# Patient Record
Sex: Male | Born: 1955
Health system: Southern US, Community
[De-identification: ages and names within clinical notes are randomized; demographics above are authoritative.]

## PROBLEM LIST (undated history)

## (undated) DIAGNOSIS — L0591 Pilonidal cyst without abscess: Secondary | ICD-10-CM

## (undated) DIAGNOSIS — Z954 Presence of other heart-valve replacement: Secondary | ICD-10-CM

## (undated) DIAGNOSIS — Q2381 Bicuspid aortic valve: Secondary | ICD-10-CM

## (undated) DIAGNOSIS — R011 Cardiac murmur, unspecified: Secondary | ICD-10-CM

## (undated) DIAGNOSIS — Z87442 Personal history of urinary calculi: Secondary | ICD-10-CM

## (undated) DIAGNOSIS — I76 Septic arterial embolism: Secondary | ICD-10-CM

## (undated) DIAGNOSIS — Z953 Presence of xenogenic heart valve: Secondary | ICD-10-CM

## (undated) DIAGNOSIS — Q231 Congenital insufficiency of aortic valve: Secondary | ICD-10-CM

## (undated) DIAGNOSIS — Z5189 Encounter for other specified aftercare: Secondary | ICD-10-CM

## (undated) DIAGNOSIS — I7789 Other specified disorders of arteries and arterioles: Secondary | ICD-10-CM

## (undated) DIAGNOSIS — M1990 Unspecified osteoarthritis, unspecified site: Secondary | ICD-10-CM

## (undated) DIAGNOSIS — I10 Essential (primary) hypertension: Secondary | ICD-10-CM

## (undated) DIAGNOSIS — Z950 Presence of cardiac pacemaker: Secondary | ICD-10-CM

## (undated) DIAGNOSIS — Q211 Atrial septal defect: Secondary | ICD-10-CM

## (undated) DIAGNOSIS — I350 Nonrheumatic aortic (valve) stenosis: Principal | ICD-10-CM

## (undated) HISTORY — DX: Other specified disorders of arteries and arterioles: I77.89

## (undated) HISTORY — DX: Congenital insufficiency of aortic valve: Q23.1

## (undated) HISTORY — DX: Encounter for other specified aftercare: Z51.89

## (undated) HISTORY — DX: Bicuspid aortic valve: Q23.81

## (undated) HISTORY — DX: Nonrheumatic aortic (valve) stenosis: I35.0

## (undated) HISTORY — DX: Essential (primary) hypertension: I10

---

## 1977-07-20 HISTORY — PX: HAND SURGERY: SHX662

## 1999-07-28 ENCOUNTER — Emergency Department (HOSPITAL_COMMUNITY): Admission: EM | Admit: 1999-07-28 | Discharge: 1999-07-28 | Payer: Self-pay | Admitting: Emergency Medicine

## 1999-10-30 ENCOUNTER — Ambulatory Visit (HOSPITAL_BASED_OUTPATIENT_CLINIC_OR_DEPARTMENT_OTHER): Admission: RE | Admit: 1999-10-30 | Discharge: 1999-10-30 | Payer: Self-pay | Admitting: Urology

## 2002-06-21 HISTORY — PX: VASECTOMY: SHX75

## 2013-07-16 ENCOUNTER — Ambulatory Visit: Payer: Self-pay | Admitting: Sports Medicine

## 2013-07-17 ENCOUNTER — Ambulatory Visit: Payer: Self-pay | Admitting: Sports Medicine

## 2013-07-24 ENCOUNTER — Ambulatory Visit (INDEPENDENT_AMBULATORY_CARE_PROVIDER_SITE_OTHER): Payer: 59 | Admitting: Sports Medicine

## 2013-07-24 ENCOUNTER — Encounter: Payer: Self-pay | Admitting: Sports Medicine

## 2013-07-24 VITALS — BP 172/99 | HR 70 | Ht 71.0 in | Wt 261.0 lb

## 2013-07-24 DIAGNOSIS — R011 Cardiac murmur, unspecified: Secondary | ICD-10-CM

## 2013-07-24 DIAGNOSIS — M545 Low back pain, unspecified: Secondary | ICD-10-CM

## 2013-07-24 DIAGNOSIS — I350 Nonrheumatic aortic (valve) stenosis: Secondary | ICD-10-CM

## 2013-07-24 DIAGNOSIS — I10 Essential (primary) hypertension: Secondary | ICD-10-CM | POA: Insufficient documentation

## 2013-07-24 DIAGNOSIS — Z299 Encounter for prophylactic measures, unspecified: Secondary | ICD-10-CM

## 2013-07-24 DIAGNOSIS — Z0000 Encounter for general adult medical examination without abnormal findings: Secondary | ICD-10-CM | POA: Insufficient documentation

## 2013-07-24 HISTORY — DX: Nonrheumatic aortic (valve) stenosis: I35.0

## 2013-07-24 MED ORDER — PREDNISONE 50 MG PO TABS
ORAL_TABLET | ORAL | Status: DC
Start: 2013-07-24 — End: 2013-09-04

## 2013-07-24 MED ORDER — MELOXICAM 15 MG PO TABS: ORAL_TABLET | ORAL | Status: DC

## 2013-07-24 MED ORDER — KETOROLAC TROMETHAMINE 30 MG/ML IJ SOLN
30.0000 mg | Freq: Once | INTRAMUSCULAR | Status: AC
  Administered 2013-07-24: 30 mg via INTRAMUSCULAR

## 2013-07-24 NOTE — Assessment & Plan Note (Addendum)
Patient is currently in significant pain. We can reevaluate this at future visits.

## 2013-07-24 NOTE — Progress Notes (Signed)
  Subjective:    CC: Establish care.   HPI:  This is a pleasant 58 year old male, he comes in with a several-day history of pain he localizes in the low back, worse after bending forward to pick something up. Pain is moderate, persistent, improving slightly. No radicular symptoms, no constitutional symptoms, no bowel or bladder dysfunction. It's worse with flexion and Valsalva.  Hypertension: Blood pressure has been elevated on multiple occasions in the distant past but he has not been with physician in about 30 years. He is in significant pain right now.  Preventive measures: Understands that he is due for a colonoscopy he would like me to set this up, also like some blood work.  Past medical history, Surgical history, Family history not pertinant except as noted below, Social history, Allergies, and medications have been entered into the medical record, reviewed, and no changes needed.   Review of Systems: No headache, visual changes, nausea, vomiting, diarrhea, constipation, dizziness, abdominal pain, skin rash, fevers, chills, night sweats, swollen lymph nodes, weight loss, chest pain, body aches, joint swelling, muscle aches, shortness of breath, mood changes, visual or auditory hallucinations.  Objective:    General: Well Developed, well nourished, and in no acute distress.  Neuro: Alert and oriented x3, extra-ocular muscles intact, sensation grossly intact.  HEENT: Normocephalic, atraumatic, pupils equal round reactive to light, neck supple, no masses, no lymphadenopathy, thyroid nonpalpable.  Skin: Warm and dry, no rashes noted.  Cardiac: Regular rate and rhythm, no rubs or gallops. He does have a 3/6 systolic ejection murmur heard best at the left and right second intercostal spaces suggestive of an aortic stenosis murmur. Respiratory: Clear to auscultation bilaterally. Not using accessory muscles, speaking in full sentences.  Abdominal: Soft, nontender, nondistended, positive bowel  sounds, no masses, no organomegaly.  Back Exam:  Inspection: Unremarkable  Motion: Flexion 45 deg, Extension 45 deg, Side Bending to 45 deg bilaterally,  Rotation to 45 deg bilaterally  SLR laying: Negative  XSLR laying: Negative  Palpable tenderness: None. FABER: negative. Sensory change: Gross sensation intact to all lumbar and sacral dermatomes.  Reflexes: 2+ at both patellar tendons, 2+ at achilles tendons, Babinski's downgoing.  Strength at foot  Plantar-flexion: 5/5 Dorsi-flexion: 5/5 Eversion: 5/5 Inversion: 5/5  Leg strength  Quad: 5/5 Hamstring: 5/5 Hip flexor: 5/5 Hip abductors: 5/5  Gait unremarkable.  Impression and Recommendations:    The patient was counselled, risk factors were discussed, anticipatory guidance given.

## 2013-07-24 NOTE — Assessment & Plan Note (Signed)
Checking routine blood work, referral for colonoscopy.

## 2013-07-24 NOTE — Assessment & Plan Note (Addendum)
Asymptomatic. This does sound like aortic stenosis. I'm going to obtain an echocardiogram.

## 2013-07-24 NOTE — Assessment & Plan Note (Signed)
Prednisone, Toradol intramuscular, home rehabilitation, Mobic.

## 2013-08-21 ENCOUNTER — Ambulatory Visit: Payer: 59 | Admitting: Sports Medicine

## 2013-08-24 ENCOUNTER — Encounter: Payer: Self-pay | Admitting: Sports Medicine

## 2013-08-29 ENCOUNTER — Telehealth: Payer: Self-pay | Admitting: *Deleted

## 2013-08-29 NOTE — Telephone Encounter (Signed)
No PA required for 2D echo.  Oscar La, LPN

## 2013-08-31 ENCOUNTER — Ambulatory Visit (HOSPITAL_COMMUNITY): Payer: 59 | Attending: Cardiovascular Disease | Admitting: Radiology

## 2013-08-31 ENCOUNTER — Other Ambulatory Visit (HOSPITAL_COMMUNITY): Payer: Self-pay | Admitting: Radiology

## 2013-08-31 DIAGNOSIS — R011 Cardiac murmur, unspecified: Secondary | ICD-10-CM

## 2013-08-31 LAB — COMPREHENSIVE METABOLIC PANEL
ALT: 18 U/L (ref 0–53)
AST: 17 U/L (ref 0–37)
Albumin: 4.3 g/dL (ref 3.5–5.2)
Alkaline Phosphatase: 58 U/L (ref 39–117)
CO2: 28 mEq/L (ref 19–32)
Calcium: 10 mg/dL (ref 8.4–10.5)
Chloride: 104 mEq/L (ref 96–112)
Creat: 1.07 mg/dL (ref 0.50–1.35)
Potassium: 4.8 mEq/L (ref 3.5–5.3)
Sodium: 141 mEq/L (ref 135–145)
Total Bilirubin: 0.7 mg/dL (ref 0.2–1.2)
Total Protein: 7 g/dL (ref 6.0–8.3)

## 2013-08-31 LAB — LIPID PANEL
Cholesterol: 185 mg/dL (ref 0–200)
HDL: 45 mg/dL (ref >39)
LDL Cholesterol: 123 mg/dL — ABNORMAL HIGH (ref 0–99)
Total CHOL/HDL Ratio: 4.1 ratio
Triglycerides: 86 mg/dL (ref <150)
VLDL: 17 mg/dL (ref 0–40)

## 2013-08-31 LAB — CBC
HCT: 41.3 % (ref 39.0–52.0)
Hemoglobin: 14.1 g/dL (ref 13.0–17.0)
MCH: 27.9 pg (ref 26.0–34.0)
MCHC: 34.1 g/dL (ref 30.0–36.0)
MCV: 81.8 fL (ref 78.0–100.0)
Platelets: 252 10*3/uL (ref 150–400)
RBC: 5.05 MIL/uL (ref 4.22–5.81)
RDW: 14.5 % (ref 11.5–15.5)
WBC: 6.6 10*3/uL (ref 4.0–10.5)

## 2013-08-31 LAB — TSH: TSH: 2.951 u[IU]/mL (ref 0.350–4.500)

## 2013-08-31 LAB — HEMOGLOBIN A1C
Hgb A1c MFr Bld: 5.7 % — ABNORMAL HIGH (ref <5.7)
Mean Plasma Glucose: 117 mg/dL — ABNORMAL HIGH (ref <117)

## 2013-08-31 LAB — COMPREHENSIVE METABOLIC PANEL WITH GFR
BUN: 17 mg/dL (ref 6–23)
Glucose, Bld: 96 mg/dL (ref 70–99)

## 2013-08-31 NOTE — Progress Notes (Signed)
Echocardiogram performed.  

## 2013-08-31 NOTE — Addendum Note (Signed)
Addended by: [name] on: 08/31/2013 05:22 PM   Modules accepted: Orders

## 2013-09-04 ENCOUNTER — Encounter: Payer: Self-pay | Admitting: Sports Medicine

## 2013-09-04 ENCOUNTER — Ambulatory Visit (INDEPENDENT_AMBULATORY_CARE_PROVIDER_SITE_OTHER): Payer: 59 | Admitting: Sports Medicine

## 2013-09-04 VITALS — BP 166/102 | HR 74 | Ht 71.0 in | Wt 258.0 lb

## 2013-09-04 DIAGNOSIS — M545 Low back pain, unspecified: Secondary | ICD-10-CM

## 2013-09-04 DIAGNOSIS — I10 Essential (primary) hypertension: Secondary | ICD-10-CM

## 2013-09-04 DIAGNOSIS — I350 Nonrheumatic aortic (valve) stenosis: Secondary | ICD-10-CM

## 2013-09-04 DIAGNOSIS — I359 Nonrheumatic aortic valve disorder, unspecified: Secondary | ICD-10-CM

## 2013-09-04 MED ORDER — ASPIRIN EC 81 MG PO TBEC: 81.0000 mg | DELAYED_RELEASE_TABLET | Freq: Every day | ORAL | Status: DC

## 2013-09-04 MED ORDER — LISINOPRIL-HYDROCHLOROTHIAZIDE 20-25 MG PO TABS: 0.5000 | ORAL_TABLET | Freq: Every day | ORAL | Status: DC

## 2013-09-04 NOTE — Assessment & Plan Note (Signed)
Resolved with conservative measures. 

## 2013-09-04 NOTE — Assessment & Plan Note (Signed)
Is going to see Dr. Burt Knack with [facility] cardiology.

## 2013-09-04 NOTE — Assessment & Plan Note (Signed)
Starting lisinopril/hydrochlorothiazide, one half tab. Aspirin 81 mg daily. Return in 2 weeks to recheck blood pressure.

## 2013-09-04 NOTE — Progress Notes (Signed)
  Subjective:    CC: Followup  HPI: Hypertension: Still elevated, no headaches, visual changes, chest pain.  Systolic murmur: Echo showed moderate to severe aortic stenosis, asymptomatic. Does have an appointment coming up with cardiology.  Low back pain: Resolved with conservative measures.  Past medical history, Surgical history, Family history not pertinant except as noted below, Social history, Allergies, and medications have been entered into the medical record, reviewed, and no changes needed.   Review of Systems: No fevers, chills, night sweats, weight loss, chest pain, or shortness of breath.   Objective:    General: Well Developed, well nourished, and in no acute distress.  Neuro: Alert and oriented x3, extra-ocular muscles intact, sensation grossly intact.  HEENT: Normocephalic, atraumatic, pupils equal round reactive to light, neck supple, no masses, no lymphadenopathy, thyroid nonpalpable.  Skin: Warm and dry, no rashes. Cardiac: Regular rate and rhythm, 3/6 systolic ejection murmur, no lower extremity edema.  Respiratory: Clear to auscultation bilaterally. Not using accessory muscles, speaking in full sentences.  Impression and Recommendations:

## 2013-09-12 ENCOUNTER — Telehealth: Payer: Self-pay | Admitting: Cardiovascular Disease

## 2013-09-12 NOTE — Telephone Encounter (Signed)
Spoke with pts wife about scheduling the pt with Dr Burt Knack for Husbands aortic stenosis.  Wife states that Dr Burt Knack was recommended by the Physicians she works with at [place] wife states she is a Marine scientist at [employer].  Informed wife that Dr Burt Knack and nurse are both out of the office for the rest of the week but I will forward this message to both of them to review and advise.  Wife verbalized understanding and very gracious that our office has assisted her so well.  Wife agrees with this plan.

## 2013-09-12 NOTE — Telephone Encounter (Signed)
New Message:  Pt's wife called to set up an appt for her husband as a new pt... She states he has Aortic Stenosis... Pt sees Dr. [name] at one of our Primary care locations. Notes in Epic. Dr. Burt Knack is completely booked from now thru Sept. Pt's wife is requesting he be worked in to see Dr. Burt Knack. She states Dr. Burt Knack was recommended to her per Dr. Rollene Fare... She is an Glass blower/designer within [employer].

## 2013-09-16 NOTE — Telephone Encounter (Signed)
I'm happy to see him as a patient. If he has been asymptomatic will see him as a next available on one of my add-on clinics.

## 2013-09-17 NOTE — Telephone Encounter (Signed)
Pt scheduled to see Dr Burt Knack 10/04/13. Pt's wife aware of appointment.

## 2013-09-18 ENCOUNTER — Ambulatory Visit (INDEPENDENT_AMBULATORY_CARE_PROVIDER_SITE_OTHER): Payer: 59 | Admitting: Sports Medicine

## 2013-09-18 ENCOUNTER — Encounter: Payer: Self-pay | Admitting: Sports Medicine

## 2013-09-18 VITALS — BP 138/91 | HR 93 | Ht 71.0 in | Wt 253.0 lb

## 2013-09-18 DIAGNOSIS — I10 Essential (primary) hypertension: Secondary | ICD-10-CM

## 2013-09-18 NOTE — Progress Notes (Signed)
  Subjective:    CC: Recheck blood pressure  HPI: Hypertension: Improved significantly on 0.5 tablets daily.  Aortic stenosis: Has an appointment to see cardiology, he is asymptomatic.  Past medical history, Surgical history, Family history not pertinant except as noted below, Social history, Allergies, and medications have been entered into the medical record, reviewed, and no changes needed.   Review of Systems: No fevers, chills, night sweats, weight loss, chest pain, or shortness of breath.   Objective:    General: Well Developed, well nourished, and in no acute distress.  Neuro: Alert and oriented x3, extra-ocular muscles intact, sensation grossly intact.  HEENT: Normocephalic, atraumatic, pupils equal round reactive to light, neck supple, no masses, no lymphadenopathy, thyroid nonpalpable.  Skin: Warm and dry, no rashes. Cardiac: Regular rate and rhythm, 2/6 systolic ejection murmur, no lower extremity edema.  Respiratory: Clear to auscultation bilaterally. Not using accessory muscles, speaking in full sentences.  Impression and Recommendations:

## 2013-09-18 NOTE — Assessment & Plan Note (Signed)
Greatly improved, increase to one whole tablet of lisinopril/hydrochlorothiazide.

## 2013-09-25 ENCOUNTER — Other Ambulatory Visit: Payer: Self-pay

## 2013-09-25 DIAGNOSIS — I10 Essential (primary) hypertension: Secondary | ICD-10-CM

## 2013-09-25 MED ORDER — LISINOPRIL-HYDROCHLOROTHIAZIDE 20-25 MG PO TABS: 1.0000 | ORAL_TABLET | Freq: Every day | ORAL | Status: DC

## 2013-10-04 ENCOUNTER — Encounter: Payer: Self-pay | Admitting: Cardiovascular Disease

## 2013-10-04 ENCOUNTER — Ambulatory Visit (INDEPENDENT_AMBULATORY_CARE_PROVIDER_SITE_OTHER): Payer: 59 | Admitting: Cardiovascular Disease

## 2013-10-04 ENCOUNTER — Other Ambulatory Visit: Payer: Self-pay | Admitting: Cardiovascular Disease

## 2013-10-04 VITALS — BP 148/91 | HR 73 | Ht 71.0 in | Wt 256.0 lb

## 2013-10-04 DIAGNOSIS — I10 Essential (primary) hypertension: Secondary | ICD-10-CM

## 2013-10-04 DIAGNOSIS — I350 Nonrheumatic aortic (valve) stenosis: Secondary | ICD-10-CM

## 2013-10-04 DIAGNOSIS — I359 Nonrheumatic aortic valve disorder, unspecified: Secondary | ICD-10-CM

## 2013-10-04 MED ORDER — AMOXICILLIN 500 MG PO TABS: ORAL_TABLET | ORAL | Status: DC

## 2013-10-04 NOTE — Patient Instructions (Addendum)
Your physician has requested that you have a TEE. During a TEE, sound waves are used to create images of your heart. It provides your doctor with information about the size and shape of your heart and how well your heart's chambers and valves are working. In this test, a transducer is attached to the end of a flexible tube that's guided down your throat and into your esophagus (the tube leading from you mouth to your stomach) to get a more detailed image of your heart. You are not awake for the procedure. Please see the instruction sheet given to you today. For further information please visit [email or web address].  Your physician has requested that you have an exercise tolerance test with Dr Burt Knack in 2-3 WEEKS. For further information please visit [email or web address]. Please also follow instruction sheet, as given.  Your physician recommends that you continue on your current medications as directed. Please refer to the Current Medication list given to you today.  You do require antibiotics prior to dental appointments. Amoxicillin 500mg  take 4 tablets by mouth one hour prior to procedure.

## 2013-10-04 NOTE — Progress Notes (Signed)
HPI:  58 year old gentleman presenting as a new patient for evaluation of aortic stenosis.  The patient was first told he had a cardiac murmur about 20 years ago. He's had no cardiac related problems over the years. He established with Dr Dianah Field in May 2015 when he was noted to have a prominent murmur of aortic stenosis. An echocardiogram was performed this confirmed moderate to severe aortic stenosis. The patient is referred today for further evaluation.  He has minimal symptoms at present. He denies shortness of breath, edema, orthopnea, PND, or palpitations. He has a minimal discomfort in his chest at high level of exertion. He denies any lightheadedness or presyncope. 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 However, he does not participate in regular aerobic exercise.  Outpatient Encounter Prescriptions as of 10/04/2013  Medication Sig  . aspirin EC 81 MG tablet Take 1 tablet (81 mg total) by mouth daily.  Marland Kitchen lisinopril-hydrochlorothiazide (PRINZIDE,ZESTORETIC) 20-25 MG per tablet Take 1 tablet by mouth daily.  . Multiple Vitamins-Minerals (CENTRUM SILVER ADULT 50+ PO) Take by mouth.  . Omega-3 Fatty Acids (FISH OIL) 1000 MG CAPS Take 3,000 mg by mouth.  Marland Kitchen amoxicillin (AMOXIL) 500 MG tablet Take 4 tablets by mouth one hour prior to dental appointment  . [DISCONTINUED] lisinopril-hydrochlorothiazide (PRINZIDE,ZESTORETIC) 20-25 MG per tablet Take 1 tablet by mouth daily.  . [DISCONTINUED] meloxicam (MOBIC) 15 MG tablet     Review of patient's allergies indicates no known allergies.  Past Medical History  Diagnosis Date  . Hypertension     Past Surgical History  Procedure Laterality Date  . Vasectomy  06/2002  . Hand surgery  07/1977    History   Social History  . Marital Status: Married    Spouse Name: N/A    Number of Children: N/A  . Years of Education: N/A   Occupational History  . Not on file.   Social History Main Topics  . Smoking  status: Never Smoker   . Smokeless tobacco: Not on file  . Alcohol Use: Yes  . Drug Use: Not on file  . Sexual Activity: Not on file   Other Topics Concern  . Not on file   Social History Narrative   The patient is married. He is originally from [place]. He works as an Art gallery manager. He does not smoke cigarettes or drink alcohol.    Family History  Problem Relation Age of Onset  . Diabetes Mother   . Cancer Mother     colon  . Diabetes Father   . Cancer Father     colon  . Cancer Daughter     leukemia  . Heart attack Maternal Grandmother   . Hypertension Maternal Grandmother   . Diabetes Maternal Grandmother   . Cancer Maternal Grandmother   . Diabetes Maternal Grandfather   . Cancer Maternal Grandfather   . Diabetes Paternal Grandmother   . Cancer Paternal Grandmother   . Stroke Paternal Grandfather   . Diabetes Paternal Grandfather   . Cancer Paternal Grandfather     ROS:  General: no fevers/chills/night sweats Eyes: no blurry vision, diplopia, or amaurosis ENT: no sore throat or hearing loss Resp: no cough, wheezing, or hemoptysis CV: no edema or palpitations GI: no abdominal pain, nausea, vomiting, diarrhea, or constipation GU: no dysuria, frequency, or hematuria Skin: no rash Neuro: no headache, numbness, tingling, or weakness of extremities Musculoskeletal: no joint pain or swelling Heme: no bleeding, DVT, or easy bruising Endo: no polydipsia  or polyuria  BP 148/91  Pulse 73  Ht 5\' 11"  (1.803 m)  Wt 256 lb (116.121 kg)  BMI 35.72 kg/m2  PHYSICAL EXAM: Pt is alert and oriented, WD, WN, overweight male in no distress. HEENT: normal Neck: JVP normal. Carotid upstrokes normal without bruits. No thyromegaly. Lungs: equal expansion, clear bilaterally CV: Apex is discrete and nondisplaced, RRR with grade 3/6 harsh systolic murmur with diminished A2  Abd: soft, NT, +BS, no bruit, no hepatosplenomegaly Back: no CVA tenderness Ext: no C/C/E         Femoral pulses 2+= without bruits        DP/PT pulses intact and = Skin: warm and dry without rash Neuro: CNII-XII intact             Strength intact = bilaterally  EKG:  Normal sinus rhythm 70 beats per minute, left ventricular hypertrophy with repolarization abnormality.  2D Echo: Left ventricle: The cavity size was moderately dilated. Wall thickness was increased in a pattern of moderate LVH. Systolic function was normal. The estimated ejection fraction was in the range of 55% to 60%. Doppler parameters are consistent with abnormal left ventricular relaxation (grade 1 diastolic dysfunction). Doppler parameters are consistent with elevated mean left atrial filling pressure.  ------------------------------------------------------------------- Aortic valve: AV is trileaflet and moderately calcified There is some leaflet excursion on PLA/PSA images However the gradients suggest modertate to severe AS and the peak velocity suggests severe AS being . m/sec. Suggest TEE to further clarify. Doppler: Valve area (VTI): 1.01 cm^2. Indexed valve area (VTI): 0.41 cm^2/m^2. Valve area (Vmax): 0.78 cm^2. Indexed valve area (Vmax): 0.31 cm^2/m^2. Mean gradient (S): 36 mm Hg. Peak gradient (S): 84 mm Hg.  ------------------------------------------------------------------- Aorta: Ascending aorta: The ascending aorta was mildly dilated.  ------------------------------------------------------------------- Mitral valve: Calcified annulus. Doppler: There was mild regurgitation. Peak gradient (D): 2 mm Hg.  ------------------------------------------------------------------- Left atrium: The atrium was mildly dilated.  ------------------------------------------------------------------- Atrial septum: No defect or patent foramen ovale was identified.  ------------------------------------------------------------------- Right ventricle: The cavity size was normal. Wall thickness was normal.  Systolic function was normal.  ------------------------------------------------------------------- Pulmonic valve: Doppler: There was mild regurgitation.  ------------------------------------------------------------------- Tricuspid valve: Structurally normal valve. Leaflet separation was normal. Doppler: Transvalvular velocity was within the normal range. There was mild regurgitation.  ------------------------------------------------------------------- Right atrium: The atrium was normal in size.  ------------------------------------------------------------------- Pericardium: The pericardium was normal in appearance.  ------------------------------------------------------------------- Prepared and Electronically Authenticated by  ASSESSMENT AND PLAN: This is a 58 year old gentleman with obesity and hypertension, now diagnosed with moderate to severe aortic stenosis. His exam and echocardiogram are both consistent with this. I personally reviewed his echo images. We discussed the natural history of severe aortic stenosis as well as treatment options. Considering his early age of onset, I suspect he has a bicuspid aortic valve. I have recommended a transesophageal echocardiogram to better define his aortic valve anatomy. I have reviewed the risks, indications, and alternatives to TEE. He understands and agrees to proceed. He will also have a non-imaging exercise treadmill study to evaluate hemodynamics with exercise. Depending on his exercise treadmill study in TEE results, will consider a period of continued close observation versus early surgical referral for aortic valve replacement. He understands the need to take SBE prophylaxis. I also advised him to avoid strenuous exertion until his evaluation is completed.  [name] MD 10/04/2013 2:37 PM

## 2013-10-08 ENCOUNTER — Encounter: Payer: Self-pay | Admitting: Sports Medicine

## 2013-10-08 ENCOUNTER — Ambulatory Visit (INDEPENDENT_AMBULATORY_CARE_PROVIDER_SITE_OTHER): Payer: 59 | Admitting: Sports Medicine

## 2013-10-08 VITALS — BP 128/88 | HR 78 | Ht 71.0 in | Wt 257.0 lb

## 2013-10-08 DIAGNOSIS — I359 Nonrheumatic aortic valve disorder, unspecified: Secondary | ICD-10-CM

## 2013-10-08 DIAGNOSIS — I10 Essential (primary) hypertension: Secondary | ICD-10-CM

## 2013-10-08 DIAGNOSIS — I350 Nonrheumatic aortic (valve) stenosis: Secondary | ICD-10-CM

## 2013-10-08 NOTE — Progress Notes (Signed)
  Subjective:    CC: Followup  HPI: Hypertension: Well controlled on a full tablet of lisinopril/hydrochlorothiazide.  Severe aortic stenosis: Has a transesophageal echocardiogram coming up, it sounds as though cardiology is leaning towards aortic valve replacement, bioprosthetic.  Past medical history, Surgical history, Family history not pertinant except as noted below, Social history, Allergies, and medications have been entered into the medical record, reviewed, and no changes needed.   Review of Systems: No fevers, chills, night sweats, weight loss, chest pain, or shortness of breath.   Objective:    General: Well Developed, well nourished, and in no acute distress.  Neuro: Alert and oriented x3, extra-ocular muscles intact, sensation grossly intact.  HEENT: Normocephalic, atraumatic, pupils equal round reactive to light, neck supple, no masses, no lymphadenopathy, thyroid nonpalpable.  Skin: Warm and dry, no rashes. Cardiac: Regular rate and rhythm, no rubs or gallops, 2/6 systolic ejection murmur, no lower extremity edema.  Respiratory: Clear to auscultation bilaterally. Not using accessory muscles, speaking in full sentences.  Impression and Recommendations:

## 2013-10-08 NOTE — Assessment & Plan Note (Signed)
Well controlled, no changes 

## 2013-10-08 NOTE — Assessment & Plan Note (Signed)
Severe, has a transesophageal echocardiogram coming up. It does sound as though cardiology is leaning towards aortic valve replacement.

## 2013-10-18 ENCOUNTER — Encounter (HOSPITAL_COMMUNITY): Payer: Self-pay | Admitting: Pharmacy Technician

## 2013-10-23 ENCOUNTER — Encounter (HOSPITAL_COMMUNITY)
Admission: RE | Disposition: A | Discharge status: Home / Self Care | Payer: Self-pay | Source: Ambulatory Visit | Attending: Cardiology

## 2013-10-23 ENCOUNTER — Ambulatory Visit (HOSPITAL_COMMUNITY)
Admission: RE | Admit: 2013-10-23 | Discharge: 2013-10-23 | Disposition: A | Discharge status: Home / Self Care | Payer: 59 | Source: Ambulatory Visit | Attending: Cardiology | Admitting: Cardiology

## 2013-10-23 DIAGNOSIS — E669 Obesity, unspecified: Secondary | ICD-10-CM | POA: Diagnosis not present

## 2013-10-23 DIAGNOSIS — Z6835 Body mass index (BMI) 35.0-35.9, adult: Secondary | ICD-10-CM | POA: Insufficient documentation

## 2013-10-23 DIAGNOSIS — I359 Nonrheumatic aortic valve disorder, unspecified: Secondary | ICD-10-CM | POA: Diagnosis present

## 2013-10-23 DIAGNOSIS — F1010 Alcohol abuse, uncomplicated: Secondary | ICD-10-CM | POA: Diagnosis not present

## 2013-10-23 DIAGNOSIS — Z792 Long term (current) use of antibiotics: Secondary | ICD-10-CM | POA: Insufficient documentation

## 2013-10-23 DIAGNOSIS — Z7982 Long term (current) use of aspirin: Secondary | ICD-10-CM | POA: Insufficient documentation

## 2013-10-23 DIAGNOSIS — Q2112 Patent foramen ovale: Secondary | ICD-10-CM

## 2013-10-23 DIAGNOSIS — Q211 Atrial septal defect: Secondary | ICD-10-CM

## 2013-10-23 DIAGNOSIS — I10 Essential (primary) hypertension: Secondary | ICD-10-CM | POA: Insufficient documentation

## 2013-10-23 HISTORY — DX: Patent foramen ovale: Q21.12

## 2013-10-23 HISTORY — DX: Atrial septal defect: Q21.1

## 2013-10-23 HISTORY — PX: TEE WITHOUT CARDIOVERSION: SHX5443

## 2013-10-23 SURGERY — ECHOCARDIOGRAM, TRANSESOPHAGEAL
Anesthesia: Moderate Sedation

## 2013-10-23 MED ORDER — FENTANYL CITRATE 0.05 MG/ML IJ SOLN
INTRAMUSCULAR | Status: AC
Start: 2013-10-23 — End: 2013-10-23
  Filled 2013-10-23: qty 2

## 2013-10-23 MED ORDER — DIPHENHYDRAMINE HCL 50 MG/ML IJ SOLN
INTRAMUSCULAR | Status: AC
  Filled 2013-10-23: qty 1

## 2013-10-23 MED ORDER — MIDAZOLAM HCL 10 MG/2ML IJ SOLN
INTRAMUSCULAR | Status: DC | PRN
  Administered 2013-10-23: 1 mg via INTRAVENOUS
  Administered 2013-10-23 (×2): 2 mg via INTRAVENOUS

## 2013-10-23 MED ORDER — SODIUM CHLORIDE 0.9 % IV SOLN: INTRAVENOUS | Status: DC

## 2013-10-23 MED ORDER — MIDAZOLAM HCL 5 MG/ML IJ SOLN
INTRAMUSCULAR | Status: AC
  Filled 2013-10-23: qty 2

## 2013-10-23 MED ORDER — FENTANYL CITRATE 0.05 MG/ML IJ SOLN
INTRAMUSCULAR | Status: DC | PRN
  Administered 2013-10-23 (×3): 25 ug via INTRAVENOUS

## 2013-10-23 MED ORDER — BUTAMBEN-TETRACAINE-BENZOCAINE 2-2-14 % EX AERO
INHALATION_SPRAY | CUTANEOUS | Status: DC | PRN
  Administered 2013-10-23: 2 via TOPICAL

## 2013-10-23 NOTE — Progress Notes (Signed)
  Echocardiogram Echocardiogram Transesophageal has been performed.  Tab Rylee FRANCES 10/23/2013, 10:02 AM

## 2013-10-23 NOTE — Interval H&P Note (Signed)
History and Physical Interval Note:  10/23/2013 9:24 AM  Patrick Walter  has presented today for surgery, with the diagnosis of AORTIC STENOSIS   The various methods of treatment have been discussed with the patient and family. After consideration of risks, benefits and other options for treatment, the patient has consented to  Procedure(s): TRANSESOPHAGEAL ECHOCARDIOGRAM (TEE) (N/A) as a surgical intervention .  The patient's history has been reviewed, patient examined, no change in status, stable for surgery.  I have reviewed the patient's chart and labs.  Questions were answered to the patient's satisfaction.     Anniebelle Devore [employer]

## 2013-10-23 NOTE — H&P (View-Only) (Signed)
HPI:  58 year old gentleman presenting as a new patient for evaluation of aortic stenosis.  The patient was first told he had a cardiac murmur about 20 years ago. He's had no cardiac related problems over the years. He established with Dr Dianah Field in May 2015 when he was noted to have a prominent murmur of aortic stenosis. An echocardiogram was performed this confirmed moderate to severe aortic stenosis. The patient is referred today for further evaluation.  He has minimal symptoms at present. He denies shortness of breath, edema, orthopnea, PND, or palpitations. He has a minimal discomfort in his chest at high level of exertion. He denies any lightheadedness or presyncope. 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 However, he does not participate in regular aerobic exercise.  Outpatient Encounter Prescriptions as of 10/04/2013  Medication Sig  . aspirin EC 81 MG tablet Take 1 tablet (81 mg total) by mouth daily.  Marland Kitchen lisinopril-hydrochlorothiazide (PRINZIDE,ZESTORETIC) 20-25 MG per tablet Take 1 tablet by mouth daily.  . Multiple Vitamins-Minerals (CENTRUM SILVER ADULT 50+ PO) Take by mouth.  . Omega-3 Fatty Acids (FISH OIL) 1000 MG CAPS Take 3,000 mg by mouth.  Marland Kitchen amoxicillin (AMOXIL) 500 MG tablet Take 4 tablets by mouth one hour prior to dental appointment  . [DISCONTINUED] lisinopril-hydrochlorothiazide (PRINZIDE,ZESTORETIC) 20-25 MG per tablet Take 1 tablet by mouth daily.  . [DISCONTINUED] meloxicam (MOBIC) 15 MG tablet     Review of patient's allergies indicates no known allergies.  Past Medical History  Diagnosis Date  . Hypertension     Past Surgical History  Procedure Laterality Date  . Vasectomy  06/2002  . Hand surgery  07/1977    History   Social History  . Marital Status: Married    Spouse Name: N/A    Number of Children: N/A  . Years of Education: N/A   Occupational History  . Not on file.   Social History Main Topics  . Smoking  status: Never Smoker   . Smokeless tobacco: Not on file  . Alcohol Use: Yes  . Drug Use: Not on file  . Sexual Activity: Not on file   Other Topics Concern  . Not on file   Social History Narrative   The patient is married. He is originally from [place]. He works as an Art gallery manager. He does not smoke cigarettes or drink alcohol.    Family History  Problem Relation Age of Onset  . Diabetes Mother   . Cancer Mother     colon  . Diabetes Father   . Cancer Father     colon  . Cancer Daughter     leukemia  . Heart attack Maternal Grandmother   . Hypertension Maternal Grandmother   . Diabetes Maternal Grandmother   . Cancer Maternal Grandmother   . Diabetes Maternal Grandfather   . Cancer Maternal Grandfather   . Diabetes Paternal Grandmother   . Cancer Paternal Grandmother   . Stroke Paternal Grandfather   . Diabetes Paternal Grandfather   . Cancer Paternal Grandfather     ROS:  General: no fevers/chills/night sweats Eyes: no blurry vision, diplopia, or amaurosis ENT: no sore throat or hearing loss Resp: no cough, wheezing, or hemoptysis CV: no edema or palpitations GI: no abdominal pain, nausea, vomiting, diarrhea, or constipation GU: no dysuria, frequency, or hematuria Skin: no rash Neuro: no headache, numbness, tingling, or weakness of extremities Musculoskeletal: no joint pain or swelling Heme: no bleeding, DVT, or easy bruising Endo: no polydipsia  or polyuria  BP 148/91  Pulse 73  Ht 5\' 11"  (1.803 m)  Wt 256 lb (116.121 kg)  BMI 35.72 kg/m2  PHYSICAL EXAM: Pt is alert and oriented, WD, WN, overweight male in no distress. HEENT: normal Neck: JVP normal. Carotid upstrokes normal without bruits. No thyromegaly. Lungs: equal expansion, clear bilaterally CV: Apex is discrete and nondisplaced, RRR with grade 3/6 harsh systolic murmur with diminished A2  Abd: soft, NT, +BS, no bruit, no hepatosplenomegaly Back: no CVA tenderness Ext: no C/C/E         Femoral pulses 2+= without bruits        DP/PT pulses intact and = Skin: warm and dry without rash Neuro: CNII-XII intact             Strength intact = bilaterally  EKG:  Normal sinus rhythm 70 beats per minute, left ventricular hypertrophy with repolarization abnormality.  2D Echo: Left ventricle: The cavity size was moderately dilated. Wall thickness was increased in a pattern of moderate LVH. Systolic function was normal. The estimated ejection fraction was in the range of 55% to 60%. Doppler parameters are consistent with abnormal left ventricular relaxation (grade 1 diastolic dysfunction). Doppler parameters are consistent with elevated mean left atrial filling pressure.  ------------------------------------------------------------------- Aortic valve: AV is trileaflet and moderately calcified There is some leaflet excursion on PLA/PSA images However the gradients suggest modertate to severe AS and the peak velocity suggests severe AS being . m/sec. Suggest TEE to further clarify. Doppler: Valve area (VTI): 1.01 cm^2. Indexed valve area (VTI): 0.41 cm^2/m^2. Valve area (Vmax): 0.78 cm^2. Indexed valve area (Vmax): 0.31 cm^2/m^2. Mean gradient (S): 36 mm Hg. Peak gradient (S): 84 mm Hg.  ------------------------------------------------------------------- Aorta: Ascending aorta: The ascending aorta was mildly dilated.  ------------------------------------------------------------------- Mitral valve: Calcified annulus. Doppler: There was mild regurgitation. Peak gradient (D): 2 mm Hg.  ------------------------------------------------------------------- Left atrium: The atrium was mildly dilated.  ------------------------------------------------------------------- Atrial septum: No defect or patent foramen ovale was identified.  ------------------------------------------------------------------- Right ventricle: The cavity size was normal. Wall thickness was normal.  Systolic function was normal.  ------------------------------------------------------------------- Pulmonic valve: Doppler: There was mild regurgitation.  ------------------------------------------------------------------- Tricuspid valve: Structurally normal valve. Leaflet separation was normal. Doppler: Transvalvular velocity was within the normal range. There was mild regurgitation.  ------------------------------------------------------------------- Right atrium: The atrium was normal in size.  ------------------------------------------------------------------- Pericardium: The pericardium was normal in appearance.  ------------------------------------------------------------------- Prepared and Electronically Authenticated by  ASSESSMENT AND PLAN: This is a 58 year old gentleman with obesity and hypertension, now diagnosed with moderate to severe aortic stenosis. His exam and echocardiogram are both consistent with this. I personally reviewed his echo images. We discussed the natural history of severe aortic stenosis as well as treatment options. Considering his early age of onset, I suspect he has a bicuspid aortic valve. I have recommended a transesophageal echocardiogram to better define his aortic valve anatomy. I have reviewed the risks, indications, and alternatives to TEE. He understands and agrees to proceed. He will also have a non-imaging exercise treadmill study to evaluate hemodynamics with exercise. Depending on his exercise treadmill study in TEE results, will consider a period of continued close observation versus early surgical referral for aortic valve replacement. He understands the need to take SBE prophylaxis. I also advised him to avoid strenuous exertion until his evaluation is completed.  [name] MD 10/04/2013 2:37 PM

## 2013-10-23 NOTE — Discharge Instructions (Signed)
Transesophageal Echocardiogram °Transesophageal echocardiography (TEE) is a picture test of your heart using sound waves. The pictures taken can give very detailed pictures of your heart. This can help your doctor see if there are problems with your heart. TEE can check: °· If your heart has blood clots in it. °· How well your heart valves are working. °· If you have an infection on the inside of your heart. °· Some of the major arteries of your heart. °· If your heart valve is working after a repair. °· Your heart before a procedure that uses a shock to your heart to get the rhythm back to normal. °BEFORE THE PROCEDURE °· Do not eat or drink for 6 hours before the procedure or as told by your doctor. °· Make plans to have someone drive you home after the procedure. Do not drive yourself home. °· An IV tube will be put in your arm. °PROCEDURE °· You will be given a medicine to help you relax (sedative). It will be given through the IV tube. °· A numbing medicine will be sprayed or gargled in the back of your throat to help numb it. °· The tip of the probe is placed into the back of your mouth. You will be asked to swallow. This helps to pass the probe into your esophagus. °· Once the tip of the probe is in the right place, your doctor can take pictures of your heart. °· You may feel pressure at the back of your throat. °AFTER THE PROCEDURE °· You will be taken to a recovery area so the sedative can wear off. °· Your throat may be sore and scratchy. This will go away slowly over time. °· You will go home when you are fully awake and able to swallow liquids. °· You should have someone stay with you for the next 24 hours. °· Do not drive or operate machinery for the next 24 hours. °Document Released: 01/03/2009 Document Revised: 03/13/2013 Document Reviewed: 09/07/2012 °ExitCare® Patient Information ©2015 [employer]. This information is not intended to replace advice given to you by your health care provider. Make  sure you discuss any questions you have with your health care provider. ° °

## 2013-10-23 NOTE — CV Procedure (Addendum)
Procedure: TEE  Indication: Aortic stenosis  Sedation: Versed 5 mg IV, Fentanyl 75 mcg IV  Findings: Please see echo section for full report.  Normal LV size with mild LV hypertrophy.  EF 60-65%. Normal RV size and systolic function.  Trivial mitral regurgitation.  Heavily calcified aortic valve with fused left and noncoronary cusps (bicuspid).  Severe aortic stenosis with mean gradient 68 mmHg and AVA about 0.9 cm^2 (has large LVOT diameter).  Mild aortic insufficiency.  The aortic root and ascending aorta are about 4.0 cm.  There was a small PFO seen by color only (Eustachian valve prevented bubbles from getting to it).   Patrick Walter 10/23/2013

## 2013-10-24 ENCOUNTER — Encounter (HOSPITAL_COMMUNITY): Payer: Self-pay | Admitting: Cardiology

## 2013-10-24 ENCOUNTER — Telehealth: Payer: Self-pay | Admitting: *Deleted

## 2013-10-24 ENCOUNTER — Encounter: Payer: Self-pay | Admitting: Gastroenterology

## 2013-10-24 NOTE — Telephone Encounter (Signed)
Patient is for direct screening colonoscopy with Dr.Kaplan on 11/08/13. Reviewing chart before pre-visit see that patient has seen cardiologist on 10/04/13 for "severe aortic stenosis" and also had TEE on 10/23/13. Please review. Is patient okay for DIRECT screening colonoscopy or needs office visit? Dr.Gessner, sending this to you(doc of day), please advise. Thank you,Robbin pv.

## 2013-10-24 NOTE — Telephone Encounter (Signed)
Spoke with patient. Explained Dr.'s recommendations. Patient denies any GI concerns/problems at this time. Patient does have family hx colon cancer (grandmother and father). Patient does not know about future plans with heart and states possible surgery. Patient request office visit with Dr.Kaplan to discuss other options vs colonoscopy. Patient was given appointment for December 27, 2013 at 9 am. Appointment letter will be mailed out to patient by [facility]. Notified patient that pre-visit for tomorrow was cancelled and also colonoscopy cancelled. He understands. No further questions or concerns from patient at this time.

## 2013-10-24 NOTE — Telephone Encounter (Signed)
Noted! Cancelled colonoscopy, recall placed for December 20, 2013 (#[id number]). Called patient to explain but no answer and no voice mail box. Will try again.

## 2013-10-24 NOTE — Telephone Encounter (Signed)
I would cancel his screening colonoscopy appt for now until he is cleared by cardiology - that work-up is more important now  Place a colon recall for October this year for back-up  Good job

## 2013-10-26 ENCOUNTER — Ambulatory Visit (INDEPENDENT_AMBULATORY_CARE_PROVIDER_SITE_OTHER): Payer: 59 | Admitting: Cardiovascular Disease

## 2013-10-26 DIAGNOSIS — I350 Nonrheumatic aortic (valve) stenosis: Secondary | ICD-10-CM

## 2013-10-26 DIAGNOSIS — I10 Essential (primary) hypertension: Secondary | ICD-10-CM

## 2013-10-26 DIAGNOSIS — I359 Nonrheumatic aortic valve disorder, unspecified: Secondary | ICD-10-CM

## 2013-10-26 NOTE — Progress Notes (Signed)
Exercise Treadmill Test  Pre-Exercise Testing Evaluation Rhythm: normal sinus  Rate: 82 bpm     Test  Exercise Tolerance Test Ordering MD: [name], MD  Interpreting MD: [name], MD  Unique Test No: 1  Treadmill:  1  Indication for ETT: aortic stenosis  Contraindication to ETT: No   Stress Modality: exercise - treadmill  Cardiac Imaging Performed: non   Protocol: standard Bruce - maximal  Max BP:  181/73  Max MPHR (bpm):  164 85% MPR (bpm):  139  MPHR obtained (bpm):  144 % MPHR obtained:  87  Reached 85% MPHR (min:sec):  5:50 Total Exercise Time (min-sec):  7:00  Workload in METS:  8.5 Borg Scale: 15  Reason ETT Terminated:  dyspnea    ST Segment Analysis At Rest: normal ST segments - no evidence of significant ST depression With Exercise: non-specific ST changes  Other Information Arrhythmia:  Yes Angina during ETT:  present (1) Quality of ETT:  diagnostic  ETT Interpretation:  normal - no evidence of ischemia by ST analysis  Comments: 1. Mild chest discomfort with exertion, resolved completely in early recovery.  2. Nonspecific ST change with exertion, 1 mm flat ST depression during recovery 3. Normal BP response to exercise 4. Few PVC's and one ventricular couplet during exercise  Recommendations: Pt with known severe aortic stenosis. Recommend cardiac cath in anticipation of cardiac surgery. Reviewed risks, indications, alternatives in detail with the patient.

## 2013-10-26 NOTE — Patient Instructions (Signed)

## 2013-11-02 ENCOUNTER — Encounter (HOSPITAL_COMMUNITY): Payer: 59

## 2013-11-05 ENCOUNTER — Encounter (HOSPITAL_COMMUNITY): Payer: Self-pay | Admitting: Pharmacy Technician

## 2013-11-08 ENCOUNTER — Encounter: Payer: 59 | Admitting: Gastroenterology

## 2013-11-08 ENCOUNTER — Ambulatory Visit (HOSPITAL_COMMUNITY)
Admission: RE | Admit: 2013-11-08 | Discharge: 2013-11-08 | Disposition: A | Discharge status: Home / Self Care | Payer: 59 | Source: Ambulatory Visit | Attending: Cardiovascular Disease | Admitting: Cardiovascular Disease

## 2013-11-08 ENCOUNTER — Encounter (HOSPITAL_COMMUNITY)
Admission: RE | Disposition: A | Discharge status: Home / Self Care | Payer: Self-pay | Source: Ambulatory Visit | Attending: Cardiovascular Disease

## 2013-11-08 DIAGNOSIS — I2510 Atherosclerotic heart disease of native coronary artery without angina pectoris: Secondary | ICD-10-CM

## 2013-11-08 DIAGNOSIS — E669 Obesity, unspecified: Secondary | ICD-10-CM | POA: Insufficient documentation

## 2013-11-08 DIAGNOSIS — I77810 Thoracic aortic ectasia: Secondary | ICD-10-CM | POA: Insufficient documentation

## 2013-11-08 DIAGNOSIS — I10 Essential (primary) hypertension: Secondary | ICD-10-CM | POA: Insufficient documentation

## 2013-11-08 DIAGNOSIS — Z6835 Body mass index (BMI) 35.0-35.9, adult: Secondary | ICD-10-CM | POA: Insufficient documentation

## 2013-11-08 DIAGNOSIS — Z7982 Long term (current) use of aspirin: Secondary | ICD-10-CM | POA: Insufficient documentation

## 2013-11-08 DIAGNOSIS — I359 Nonrheumatic aortic valve disorder, unspecified: Secondary | ICD-10-CM | POA: Diagnosis present

## 2013-11-08 HISTORY — PX: LEFT AND RIGHT HEART CATHETERIZATION WITH CORONARY ANGIOGRAM: SHX5449

## 2013-11-08 LAB — BASIC METABOLIC PANEL
Anion gap: 11 (ref 5–15)
BUN: 27 mg/dL — AB (ref 6–23)
CALCIUM: 10 mg/dL (ref 8.4–10.5)
CO2: 26 meq/L (ref 19–32)
CREATININE: 1.35 mg/dL (ref 0.50–1.35)
Chloride: 101 mEq/L (ref 96–112)
GFR calc Af Amer: 66 mL/min — ABNORMAL LOW (ref >90)
GFR, EST NON AFRICAN AMERICAN: 57 mL/min — AB (ref >90)
Glucose, Bld: 109 mg/dL — ABNORMAL HIGH (ref 70–99)
Potassium: 4 mEq/L (ref 3.7–5.3)
Sodium: 138 mEq/L (ref 137–147)

## 2013-11-08 LAB — POCT I-STAT 3, VENOUS BLOOD GAS (G3P V)
ACID-BASE EXCESS: 1 mmol/L (ref 0.0–2.0)
Bicarbonate: 26.5 mEq/L — ABNORMAL HIGH (ref 20.0–24.0)
O2 SAT: 63 %
TCO2: 28 mmol/L (ref 0–100)
pCO2, Ven: 45.5 mmHg (ref 45.0–50.0)
pH, Ven: 7.373 — ABNORMAL HIGH (ref 7.250–7.300)
pO2, Ven: 34 mmHg (ref 30.0–45.0)

## 2013-11-08 LAB — POCT I-STAT 3, ART BLOOD GAS (G3+)
Bicarbonate: 25.1 mEq/L — ABNORMAL HIGH (ref 20.0–24.0)
O2 Saturation: 90 %
PCO2 ART: 40.5 mmHg (ref 35.0–45.0)
PH ART: 7.401 (ref 7.350–7.450)
TCO2: 26 mmol/L (ref 0–100)
pO2, Arterial: 59 mmHg — ABNORMAL LOW (ref 80.0–100.0)

## 2013-11-08 LAB — PROTIME-INR
INR: 1.02 (ref 0.00–1.49)
Prothrombin Time: 13.4 seconds (ref 11.6–15.2)

## 2013-11-08 LAB — CBC
HCT: 40.4 % (ref 39.0–52.0)
Hemoglobin: 13.3 g/dL (ref 13.0–17.0)
MCH: 28.2 pg (ref 26.0–34.0)
MCHC: 32.9 g/dL (ref 30.0–36.0)
MCV: 85.8 fL (ref 78.0–100.0)
PLATELETS: 244 10*3/uL (ref 150–400)
RBC: 4.71 MIL/uL (ref 4.22–5.81)
RDW: 13.3 % (ref 11.5–15.5)
WBC: 7 10*3/uL (ref 4.0–10.5)

## 2013-11-08 SURGERY — LEFT AND RIGHT HEART CATHETERIZATION WITH CORONARY ANGIOGRAM
Anesthesia: LOCAL

## 2013-11-08 MED ORDER — SODIUM CHLORIDE 0.9 % IV SOLN
INTRAVENOUS | Status: DC
  Administered 2013-11-08: 06:00:00 via INTRAVENOUS

## 2013-11-08 MED ORDER — ACETAMINOPHEN 325 MG PO TABS: 650.0000 mg | ORAL_TABLET | ORAL | Status: DC | PRN

## 2013-11-08 MED ORDER — VERAPAMIL HCL 2.5 MG/ML IV SOLN
INTRAVENOUS | Status: AC
  Filled 2013-11-08: qty 2

## 2013-11-08 MED ORDER — SODIUM CHLORIDE 0.9 % IV SOLN: 1.0000 mL/kg/h | INTRAVENOUS | Status: DC

## 2013-11-08 MED ORDER — LIDOCAINE HCL (PF) 1 % IJ SOLN
INTRAMUSCULAR | Status: AC
  Filled 2013-11-08: qty 30

## 2013-11-08 MED ORDER — NITROGLYCERIN 0.2 MG/ML ON CALL CATH LAB
INTRAVENOUS | Status: AC
  Filled 2013-11-08: qty 1

## 2013-11-08 MED ORDER — SODIUM CHLORIDE 0.9 % IJ SOLN: 3.0000 mL | Freq: Two times a day (BID) | INTRAMUSCULAR | Status: DC

## 2013-11-08 MED ORDER — ONDANSETRON HCL 4 MG/2ML IJ SOLN: 4.0000 mg | Freq: Four times a day (QID) | INTRAMUSCULAR | Status: DC | PRN

## 2013-11-08 MED ORDER — MIDAZOLAM HCL 2 MG/2ML IJ SOLN
INTRAMUSCULAR | Status: AC
  Filled 2013-11-08: qty 2

## 2013-11-08 MED ORDER — HEPARIN SODIUM (PORCINE) 1000 UNIT/ML IJ SOLN
INTRAMUSCULAR | Status: AC
  Filled 2013-11-08: qty 1

## 2013-11-08 MED ORDER — FENTANYL CITRATE 0.05 MG/ML IJ SOLN
INTRAMUSCULAR | Status: AC
  Filled 2013-11-08: qty 2

## 2013-11-08 MED ORDER — SODIUM CHLORIDE 0.9 % IV SOLN: 250.0000 mL | INTRAVENOUS | Status: DC | PRN

## 2013-11-08 MED ORDER — HEPARIN (PORCINE) IN NACL 2-0.9 UNIT/ML-% IJ SOLN
INTRAMUSCULAR | Status: AC
  Filled 2013-11-08: qty 1500

## 2013-11-08 MED ORDER — SODIUM CHLORIDE 0.9 % IJ SOLN: 3.0000 mL | INTRAMUSCULAR | Status: DC | PRN

## 2013-11-08 MED ORDER — ASPIRIN 81 MG PO CHEW: 81.0000 mg | CHEWABLE_TABLET | ORAL | Status: DC

## 2013-11-08 NOTE — Discharge Instructions (Signed)
Radial Site Care °Refer to this sheet in the next few weeks. These instructions provide you with information on caring for yourself after your procedure. Your caregiver may also give you more specific instructions. Your treatment has been planned according to current medical practices, but problems sometimes occur. Call your caregiver if you have any problems or questions after your procedure. °HOME CARE INSTRUCTIONS °· You may shower the day after the procedure. Remove the bandage (dressing) and gently wash the site with plain soap and water. Gently pat the site dry. °· Do not apply powder or lotion to the site. °· Do not submerge the affected site in water for 3 to 5 days. °· Inspect the site at least twice daily. °· Do not flex or bend the affected arm for 24 hours. °· No lifting over 5 pounds (2.3 kg) for 5 days after your procedure. °· Do not drive home if you are discharged the same day of the procedure. Have someone else drive you. °· You may drive 24 hours after the procedure unless otherwise instructed by your caregiver. °· Do not operate machinery or power tools for 24 hours. °· A responsible adult should be with you for the first 24 hours after you arrive home. °What to expect: °· Any bruising will usually fade within 1 to 2 weeks. °· Blood that collects in the tissue (hematoma) may be painful to the touch. It should usually decrease in size and tenderness within 1 to 2 weeks. °SEEK IMMEDIATE MEDICAL CARE IF: °· You have unusual pain at the radial site. °· You have redness, warmth, swelling, or pain at the radial site. °· You have drainage (other than a small amount of blood on the dressing). °· You have chills. °· You have a fever or persistent symptoms for more than 72 hours. °· You have a fever and your symptoms suddenly get worse. °· Your arm becomes pale, cool, tingly, or numb. °· You have heavy bleeding from the site. Hold pressure on the site. °Document Released: 04/10/2010 Document Revised:  05/31/2011 Document Reviewed: 04/10/2010 °ExitCare® Patient Information ©2015 [employer]. This information is not intended to replace advice given to you by your health care provider. Make sure you discuss any questions you have with your health care provider. ° °

## 2013-11-08 NOTE — CV Procedure (Signed)
    Cardiac Catheterization Procedure Note  Name: Patrick Walter MRN: [id number] DOB: April 19, 1955  Procedure: Right Heart Cath, Left Heart Cath, Selective Coronary Angiography, Aortic root angiography  Indication: This is a 58 year old gentleman with severe symptomatic aortic valve stenosis. He presents for right and left heart catheterization as part of a preoperative evaluation for anticipated cardiac surgery.    Procedural Details: There was an indwelling IV in a right antecubital vein. Using normal sterile technique, the IV was changed out for a 5 Fr brachial sheath over a 0.018 inch wire. The right wrist was then prepped, draped, and anesthetized with 1% lidocaine. Using the modified Seldinger technique a 5/6 French Slender sheath was placed in the right radial artery. Intra-arterial verapamil was administered through the radial artery sheath. IV heparin was administered after a JR4 catheter was advanced into the central aorta. A Swan-Ganz catheter was used for the right heart catheterization. Standard protocol was followed for recording of right heart pressures and sampling of oxygen saturations. Fick cardiac output was calculated. Standard Judkins catheters were used for selective coronary angiography. A pigtail catheter was used to perform aortic root angiography. There were no immediate procedural complications. The patient was transferred to the post catheterization recovery area for further monitoring.  Procedural Findings: Hemodynamics RA 10 RV 33/13 PA 33/12 mean 24 PCWP 15 LV not recorded AO 111/81  Oxygen saturations: PA 63 AO 90  Cardiac Output (Fick) 6.3  Cardiac Index (Fick) 2.7   Coronary angiography: Coronary dominance: right  Left mainstem: The left mainstem is widely patent its without significant stenosis. It divides into the LAD and left circumflex. The left main arises from the left coronary cusp.  Left anterior descending (LAD): The LAD has mild proximal  irregularity with 20-30% stenosis. The mid vessel has diffuse plaquing without any significant obstruction. The diagonal branches are patent.  Left circumflex (LCx): The left circumflex is large in caliber. There is a large first obtuse marginal branch no significant stenosis. The AV circumflex extends down the AV groove and supplies a left posterolateral branch without significant stenosis.  Right coronary artery (RCA): The RCA is a dominant vessel. There are minor irregularities noted but no significant stenosis throughout the proximal, mid, or distal vessel. The PDA and PLA branches are patent.  Aortic root angiography: There is very severe calcification of the aortic valve with extremely restricted mobility. The aortic root and proximal ascending aorta are dilated.  Contrast: 95 cc Omnipaque  Radiation dose/Fluoro time: 7.6 minutes  Estimated Blood Loss: minimal  Final Conclusions:   1. Severely calcified aortic valve with known severe AS 2. Patent coronary arteries with minor diffuse nonobstructive disease 3. Normal right heart hemodynamics 4. Dilated aortic root  Recommendations: Cardiac surgery referral. Ascending aorta does not appear to be severely dilated, but will order CTA of the chest to evaluate aortic dimensions in this patient with a known bicuspid Ao valve.   [name] MD, [facility] 11/08/2013, 8:23 AM

## 2013-11-08 NOTE — H&P (Signed)
HPI:  58 year old gentleman presenting as a new patient for evaluation of aortic stenosis.   The patient was first told he had a cardiac murmur about 20 years ago. He's had no cardiac related problems over the years. He established with Dr Dianah Field in May 2015 when he was noted to have a prominent murmur of aortic stenosis. An echocardiogram was performed this confirmed moderate to severe aortic stenosis. The patient is referred today for further evaluation.   He has minimal symptoms at present. He denies shortness of breath, edema, orthopnea, PND, or palpitations. He has a minimal discomfort in his chest at high level of exertion. He denies any lightheadedness or presyncope. 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 However, he does not participate in regular aerobic exercise.    Outpatient Encounter Prescriptions as of 10/04/2013   Medication  Sig   .  aspirin EC 81 MG tablet  Take 1 tablet (81 mg total) by mouth daily.   Marland Kitchen  lisinopril-hydrochlorothiazide (PRINZIDE,ZESTORETIC) 20-25 MG per tablet  Take 1 tablet by mouth daily.   .  Multiple Vitamins-Minerals (CENTRUM SILVER ADULT 50+ PO)  Take by mouth.   .  Omega-3 Fatty Acids (FISH OIL) 1000 MG CAPS  Take 3,000 mg by mouth.   Marland Kitchen  amoxicillin (AMOXIL) 500 MG tablet  Take 4 tablets by mouth one hour prior to dental appointment   .  [DISCONTINUED] lisinopril-hydrochlorothiazide (PRINZIDE,ZESTORETIC) 20-25 MG per tablet  Take 1 tablet by mouth daily.   .  [DISCONTINUED] meloxicam (MOBIC) 15 MG tablet          Review of patient's allergies indicates no known allergies.    Past Medical History   Diagnosis  Date   .  Hypertension           Past Surgical History   Procedure  Laterality  Date   .  Vasectomy    06/2002   .  Hand surgery    07/1977         History       Social History   .  Marital Status:  Married       Spouse Name:  N/A       Number of Children:  N/A   .  Years of  Education:  N/A       Occupational History   .  Not on file.       Social History Main Topics   .  Smoking status:  Never Smoker    .  Smokeless tobacco:  Not on file   .  Alcohol Use:  Yes   .  Drug Use:  Not on file   .  Sexual Activity:  Not on file       Other Topics  Concern   .  Not on file       Social History Narrative     The patient is married. He is originally from [place]. He works as an Art gallery manager. He does not smoke cigarettes or drink alcohol.         Family History   Problem  Relation  Age of Onset   .  Diabetes  Mother     .  Cancer  Mother         colon   .  Diabetes  Father     .  Cancer  Father         colon   .  Cancer  Daughter         leukemia   .  Heart attack  Maternal Grandmother     .  Hypertension  Maternal Grandmother     .  Diabetes  Maternal Grandmother     .  Cancer  Maternal Grandmother     .  Diabetes  Maternal Grandfather     .  Cancer  Maternal Grandfather     .  Diabetes  Paternal Grandmother     .  Cancer  Paternal Grandmother     .  Stroke  Paternal Grandfather     .  Diabetes  Paternal Grandfather     .  Cancer  Paternal Grandfather          ROS:   General: no fevers/chills/night sweats Eyes: no blurry vision, diplopia, or amaurosis ENT: no sore throat or hearing loss Resp: no cough, wheezing, or hemoptysis CV: no edema or palpitations GI: no abdominal pain, nausea, vomiting, diarrhea, or constipation GU: no dysuria, frequency, or hematuria Skin: no rash Neuro: no headache, numbness, tingling, or weakness of extremities Musculoskeletal: no joint pain or swelling Heme: no bleeding, DVT, or easy bruising Endo: no polydipsia or polyuria   BP 148/91  Pulse 73  Ht 5\' 11"  (1.803 m)  Wt 256 lb (116.121 kg)  BMI 35.72 kg/m2   PHYSICAL EXAM: Pt is alert and oriented, WD, WN, overweight male in no distress. HEENT: normal Neck: JVP normal. Carotid upstrokes normal without bruits. No thyromegaly. Lungs:  equal expansion, clear bilaterally CV: Apex is discrete and nondisplaced, RRR with grade 3/6 harsh systolic murmur with diminished A2   Abd: soft, NT, +BS, no bruit, no hepatosplenomegaly Back: no CVA tenderness Ext: no C/C/E        Femoral pulses 2+= without bruits        DP/PT pulses intact and = Skin: warm and dry without rash Neuro: CNII-XII intact             Strength intact = bilaterally   EKG:  Normal sinus rhythm 70 beats per minute, left ventricular hypertrophy with repolarization abnormality.   2D Echo: Left ventricle: The cavity size was moderately dilated. Wall thickness was increased in a pattern of moderate LVH. Systolic function was normal. The estimated ejection fraction was in the range of 55% to 60%. Doppler parameters are consistent with abnormal left ventricular relaxation (grade 1 diastolic dysfunction). Doppler parameters are consistent with elevated mean left atrial filling pressure.  ------------------------------------------------------------------- Aortic valve: AV is trileaflet and moderately calcified There is some leaflet excursion on PLA/PSA images However the gradients suggest modertate to severe AS and the peak velocity suggests severe AS being . m/sec. Suggest TEE to further clarify. Doppler: Valve area (VTI): 1.01 cm^2. Indexed valve area (VTI): 0.41 cm^2/m^2. Valve area (Vmax): 0.78 cm^2. Indexed valve area (Vmax): 0.31 cm^2/m^2. Mean gradient (S): 36 mm Hg. Peak gradient (S): 84 mm Hg.  ------------------------------------------------------------------- Aorta: Ascending aorta: The ascending aorta was mildly dilated.  ------------------------------------------------------------------- Mitral valve: Calcified annulus. Doppler: There was mild regurgitation. Peak gradient (D): 2 mm Hg.  ------------------------------------------------------------------- Left atrium: The atrium was mildly  dilated.  ------------------------------------------------------------------- Atrial septum: No defect or patent foramen ovale was identified.  ------------------------------------------------------------------- Right ventricle: The cavity size was normal. Wall thickness was normal. Systolic function was normal.  ------------------------------------------------------------------- Pulmonic valve: Doppler: There was mild regurgitation.  ------------------------------------------------------------------- Tricuspid valve: Structurally normal valve. Leaflet separation was normal. Doppler: Transvalvular velocity was within the normal range. There was mild regurgitation.  -------------------------------------------------------------------  Right atrium: The atrium was normal in size.  ------------------------------------------------------------------- Pericardium: The pericardium was normal in appearance.  ------------------------------------------------------------------- Prepared and Electronically Authenticated by   ASSESSMENT AND PLAN: This is a 58 year old gentleman with obesity and hypertension, now diagnosed with moderate to severe aortic stenosis. His exam and echocardiogram are both consistent with this. I personally reviewed his echo images. We discussed the natural history of severe aortic stenosis as well as treatment options. Considering his early age of onset, I suspect he has a bicuspid aortic valve. I have recommended a transesophageal echocardiogram to better define his aortic valve anatomy. I have reviewed the risks, indications, and alternatives to TEE. He understands and agrees to proceed. He will also have a non-imaging exercise treadmill study to evaluate hemodynamics with exercise. Depending on his exercise treadmill study in TEE results, will consider a period of continued close observation versus early surgical referral for aortic valve replacement. He understands the  need to take SBE prophylaxis. I also advised him to avoid strenuous exertion until his evaluation is completed.   [name] MD 10/04/2013 2:37 PM   ADDENDUM 11/08/13: The patient has now undergone a TEE and exercise treadmill study. These studies are copy below:  TEE Study Conclusions  - Left ventricle: The cavity size was normal. Wall thickness was increased in a pattern of mild LVH. Systolic function was normal. The estimated ejection fraction was in the range of 60% to 65%. Wall motion was normal; there were no regional wall motion abnormalities. - Aortic valve: Heavily calcified aortic valve with fused left and noncoronary cusps (bicuspid). Severe aortic stenosis with mean gradient 68 mmHg and AVA about 0.9 cm^2 (has large LVOT diameter). Mild aortic insufficiency. Valve area (Vmean): 0.79 cm^2. - Aorta: The aortic root and ascending aorta are dilated to about 4.0 cm. - Mitral valve: There was trivial regurgitation. - Left atrium: No evidence of thrombus in the atrial cavity or appendage. - Right ventricle: The cavity size was normal. Systolic function was normal. - Right atrium: No evidence of thrombus in the atrial cavity or appendage. - Atrial septum: There was a small PFO seen by color only (Eustachian valve prevented bubbles from getting to it).  Test  Exercise Tolerance Test  Ordering MD: [name], MD  Interpreting MD: [name], MD   Unique Test No: 1  Treadmill: 1   Indication for ETT: aortic stenosis  Contraindication to ETT: No   Stress Modality: exercise - treadmill  Cardiac Imaging Performed: non   Protocol: standard Bruce - maximal  Max BP: 181/73   Max MPHR (bpm): 164  85% MPR (bpm): 139   MPHR obtained (bpm): 144  % MPHR obtained: 87   Reached 85% MPHR (min:sec): 5:50  Total Exercise Time (min-sec): 7:00   Workload in METS: 8.5  Borg Scale: 15   Reason ETT Terminated: dyspnea    ST Segment Analysis  At Rest: normal ST segments - no  evidence of significant ST depression  With Exercise: non-specific ST changes  Other Information  Arrhythmia: Yes Angina during ETT: present (1)  Quality of ETT: diagnostic  ETT Interpretation: normal - no evidence of ischemia by ST analysis  Comments:  1. Mild chest discomfort with exertion, resolved completely in early recovery.  2. Nonspecific ST change with exertion, 1 mm flat ST depression during recovery  3. Normal BP response to exercise  4. Few PVC's and one ventricular couplet during exercise  Recommendations:  Pt with known severe aortic stenosis. Recommend cardiac cath in  anticipation of cardiac surgery. Reviewed risks, indications, alternatives in detail with the patient.   His TEE demonstrated very severe aortic stenosis. He had exertional angina on the treadmill early in stage III. He is a physically active 58 year old male with symptomatic severe aortic stenosis. He will undergo right and left heart catheterization with plans for surgical referral after cardiac catheterization  I have reviewed the risks, indications, and alternatives to cardiac catheterization were reviewed with the patient and family. Risks include but are not limited to bleeding, infection, vascular injury, stroke, myocardial infection, arrhythmia, emergency cardiac surgery, and death. The patient understands the risks of serious complication is low (<7%).   [name] 11/08/2013 7:47 AM

## 2013-11-08 NOTE — Progress Notes (Signed)
Site area: right brachial 6 fr vein sheath  Site Prior to Removal:  Level 0 Pressure Applied For: 15 minutes Manual:  yes  Patient Status During Pull:  No complications  Post Pull Site:  Level 0 Post Pull Instructions Given:  yes Post Pull Pulses Present: radial +1 Dressing Applied:  tegaderm Bedrest begins @ see TR band  Comments: sent to short stay/ Museum/gallery exhibitions officer

## 2013-11-08 NOTE — Progress Notes (Signed)
pts radial site care bleed about 5 minutes after 3cc of air removed.  I inserted 3cc of air back into radial band.  I will continue to monitor closely and will reassess in 30 minutes to see if I can start withdrawing air out

## 2013-11-12 ENCOUNTER — Other Ambulatory Visit: Payer: Self-pay

## 2013-11-12 DIAGNOSIS — I359 Nonrheumatic aortic valve disorder, unspecified: Secondary | ICD-10-CM

## 2013-11-13 ENCOUNTER — Encounter: Payer: Self-pay | Admitting: Cardiovascular Disease

## 2013-11-14 ENCOUNTER — Ambulatory Visit (HOSPITAL_COMMUNITY)
Admission: RE | Admit: 2013-11-14 | Discharge: 2013-11-14 | Disposition: A | Discharge status: Home / Self Care | Payer: 59 | Source: Ambulatory Visit | Attending: Cardiovascular Disease | Admitting: Cardiovascular Disease

## 2013-11-14 DIAGNOSIS — I359 Nonrheumatic aortic valve disorder, unspecified: Secondary | ICD-10-CM

## 2013-11-14 MED ORDER — METOPROLOL TARTRATE 1 MG/ML IV SOLN
2.5000 mg | Freq: Once | INTRAVENOUS | Status: AC
  Administered 2013-11-14: 2.5 mg via INTRAVENOUS
  Filled 2013-11-14: qty 5

## 2013-11-14 MED ORDER — IOHEXOL 350 MG/ML SOLN
100.0000 mL | Freq: Once | INTRAVENOUS | Status: AC | PRN
  Administered 2013-11-14: 100 mL via INTRAVENOUS

## 2013-11-14 MED ORDER — METOPROLOL TARTRATE 1 MG/ML IV SOLN
INTRAVENOUS | Status: DC
Start: 2013-11-14 — End: 2013-11-15
  Filled 2013-11-14: qty 5

## 2013-11-16 ENCOUNTER — Other Ambulatory Visit: Payer: Self-pay

## 2013-11-16 ENCOUNTER — Encounter: Payer: 59 | Admitting: Thoracic Surgery (Cardiothoracic Vascular Surgery)

## 2013-11-16 ENCOUNTER — Institutional Professional Consult (permissible substitution) (INDEPENDENT_AMBULATORY_CARE_PROVIDER_SITE_OTHER): Payer: 59 | Admitting: Thoracic Surgery (Cardiothoracic Vascular Surgery)

## 2013-11-16 ENCOUNTER — Encounter: Payer: Self-pay | Admitting: Thoracic Surgery (Cardiothoracic Vascular Surgery)

## 2013-11-16 VITALS — BP 144/89 | HR 63 | Resp 16 | Ht 71.0 in | Wt 254.0 lb

## 2013-11-16 DIAGNOSIS — I350 Nonrheumatic aortic (valve) stenosis: Secondary | ICD-10-CM

## 2013-11-16 DIAGNOSIS — Q231 Congenital insufficiency of aortic valve: Secondary | ICD-10-CM

## 2013-11-16 DIAGNOSIS — I359 Nonrheumatic aortic valve disorder, unspecified: Secondary | ICD-10-CM

## 2013-11-16 NOTE — Progress Notes (Signed)
[address]       [place], [address]             [facility] REPORT  Referring Provider is [name], MD PCP is Aundria Mems, MD  Chief Complaint  Patient presents with  . Aortic Stenosis    referred by Dr. Brigid Re  11/08/13.Marland KitchenMarland KitchenTEE  10/23/13    HPI:  Patient is a 58 year old white male with recently discovered bicuspid aortic valve and severe aortic stenosis referred for surgical consultation. The patient states that he was told he had a heart murmur many years ago, but he never underwent any sort of formal cardiac evaluation. He has remained otherwise healthy and reasonably active physically for all of his life.  He was recently evaluated by his primary care physician and noted to have a prominent systolic murmur on routine physical exam. An echocardiogram was performed demonstrating what was reported to be moderate to severe aortic stenosis, although the peak velocity across the aortic valve was measured > 4.5 m/sec. He was referred to Dr. Burt Knack for consultation.  At the time the patient denied any symptoms of exertional shortness of breath or chest discomfort, but he admitted that he had not been exercising regularly for quite some time. Review of the patient's transthoracic echocardiogram suggested the possibility that the patient might have severe aortic stenosis with a congenitally bicuspid valve.  He subsequently underwent transesophageal echocardiogram and exercise stress testing. Transesophageal echocardiogram confirmed the presence of severe aortic stenosis with congenital a bicuspid aortic valve.  Peak velocity across the aortic valve measured > 5 m/sec and the mean transvalvular gradient was estimated to be 68 mmHg.  On exercise treadmill the patient developed exertional chest discomfort early into stage III of a conventional Bruce protocol.  The patient subsequently underwent left and right heart  catheterization which revealed only minor diffuse nonobstructive coronary artery disease and normal pulmonary artery pressures. The aortic root appeared somewhat dilated at catheterization.  The patient was scheduled for cardiac gated CT angiogram of the heart to rule out significant aneurysmal enlargement of the aortic root and proximal descending thoracic aorta and referred for elective surgical consultation.  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 He has remained active for most of his life, although he admits that he does not participate in any type of aerobic exercise on a regular basis.  He denies any symptoms of exertional shortness of breath or chest discomfort, although he admit that he did develop chest tightness during his recent stress test.  He denies any history of resting shortness of breath, PND, orthopnea, or lower extremity edema. He has not had any tachypalpitations or dizzy spells.   Past Medical History  Diagnosis Date  . Hypertension   . Bicuspid aortic valve   . Aortic stenosis 07/24/2013    Past Surgical History  Procedure Laterality Date  . Vasectomy  06/2002  . Hand surgery  07/1977  . Tee without cardioversion N/A 10/23/2013    Procedure: TRANSESOPHAGEAL ECHOCARDIOGRAM (TEE);  Surgeon: Larey Dresser, MD;  Location: [facility] ENDOSCOPY;  Service: Cardiovascular;  Laterality: N/A;    Family History  Problem Relation Age of Onset  . Diabetes Mother   . Cancer Mother     colon  . Diabetes Father   .  Cancer Father     colon  . Cancer Daughter     leukemia  . Heart attack Maternal Grandmother   . Hypertension Maternal Grandmother   . Diabetes Maternal Grandmother   . Cancer Maternal Grandmother   . Diabetes Maternal Grandfather   . Cancer Maternal Grandfather   . Diabetes Paternal  Grandmother   . Cancer Paternal Grandmother   . Stroke Paternal Grandfather   . Diabetes Paternal Grandfather   . Cancer Paternal Grandfather     History   Social History  . Marital Status: Married    Spouse Name: N/A    Number of Children: N/A  . Years of Education: N/A   Occupational History  . Not on file.   Social History Main Topics  . Smoking status: Never Smoker   . Smokeless tobacco: Not on file  . Alcohol Use: Yes  . Drug Use: Not on file  . Sexual Activity: Not on file   Other Topics Concern  . Not on file   Social History Narrative   The patient is married. He is originally from [place]. He works as an Art gallery manager. He does not smoke cigarettes or drink alcohol.    Current Outpatient Prescriptions  Medication Sig Dispense Refill  . aspirin EC 81 MG tablet Take 1 tablet (81 mg total) by mouth daily.  90 tablet  3  . lisinopril-hydrochlorothiazide (PRINZIDE,ZESTORETIC) 20-25 MG per tablet Take 1 tablet by mouth daily.  90 tablet  3  . Multiple Vitamin (MULTIVITAMIN WITH MINERALS) TABS tablet Take 1 tablet by mouth daily.      . Omega-3 Fatty Acids (FISH OIL) 1000 MG CAPS Take 3,000 mg by mouth daily.        No current facility-administered medications for this visit.    No Known Allergies    Review of Systems:   General:  normal appetite, normal energy, no weight gain, no weight loss, no fever  Cardiac:  + chest pain with exertion, no chest pain at rest, no SOB with exertion, no resting SOB, no PND, no orthopnea, no palpitations, no arrhythmia, no atrial fibrillation, no LE edema, no dizzy spells, no syncope  Respiratory:  no shortness of breath, no home oxygen, no productive cough, no dry cough, no bronchitis, no wheezing, no hemoptysis, no asthma, no pain with inspiration or cough, no sleep apnea, no CPAP at night  GI:   no difficulty swallowing, no reflux, no frequent heartburn, no hiatal hernia, no abdominal pain, no constipation, no diarrhea,  no hematochezia, no hematemesis, no melena  GU:   no dysuria,  no frequency, no urinary tract infection, no hematuria, no enlarged prostate, no kidney stones, no kidney disease  Vascular:  no pain suggestive of claudication, no pain in feet, no leg cramps, no varicose veins, no DVT, no non-healing foot ulcer  Neuro:   no stroke, no TIA's, no seizures, no headaches, no temporary blindness one eye,  no slurred speech, no peripheral neuropathy, no chronic pain, no instability of gait, no memory/cognitive dysfunction  Musculoskeletal: no arthritis, no joint swelling, no myalgias, no difficulty walking, normal mobility   Skin:   no rash, no itching, no skin infections, no pressure sores or ulcerations  Psych:   no anxiety, no depression, no nervousness, no unusual recent stress  Eyes:   no blurry vision, no floaters, no recent vision changes, + wears glasses or contacts  ENT:   no hearing loss, no loose or painful teeth, no dentures, last saw dentist  2010  Hematologic:  no easy bruising, no abnormal bleeding, no clotting disorder, no frequent epistaxis  Endocrine:  no diabetes, does not check CBG's at home     Physical Exam:   BP 144/89  Pulse 63  Resp 16  Ht 5\' 11"  (1.803 m)  Wt 254 lb (115.214 kg)  BMI 35.44 kg/m2  SpO2 97%  General:  Moderately obese but o/w  well-appearing  HEENT:  Unremarkable   Neck:   no JVD, no bruits, no adenopathy   Chest:   clear to auscultation, symmetrical breath sounds, no wheezes, no rhonchi   CV:   RRR, grade III/VI crescendo/decrescendo systolic murmur   Abdomen:  soft, non-tender, no masses   Extremities:  warm, well-perfused, pulses palpable, no LE edema  Rectal/GU  Deferred  Neuro:   Grossly non-focal and symmetrical throughout  Skin:   Clean and dry, no rashes, no breakdown   Diagnostic Tests:  Transthoracic Echocardiography  Patient: Patrick Walter, Patrick Walter MR #: [id number] Study Date: 08/31/2013 Gender: M Age: 42 Height: 180.3 cm Weight: 118.4  kg BSA: 2.48 m^2 Pt. Status: Room:  ATTENDING Jenkins Rouge, M.D. SONOGRAPHER Charlann Noss, RDCS ORDERING [name] REFERRING [name] PERFORMING [facility], Outpatient  cc:  ------------------------------------------------------------------- LV EF: 55% - 60%  ------------------------------------------------------------------- Indications: Murmur 785.2.  ------------------------------------------------------------------- History: PMH: Acquired from the patient and from the patient&'s chart. Murmur. Risk factors: Hypertension.  ------------------------------------------------------------------- Study Conclusions  - Left ventricle: The cavity size was moderately dilated. Wall thickness was increased in a pattern of moderate LVH. Systolic function was normal. The estimated ejection fraction was in the range of 55% to 60%. Doppler parameters are consistent with abnormal left ventricular relaxation (grade 1 diastolic dysfunction). Doppler parameters are consistent with elevated mean left atrial filling pressure. - Aortic valve: AV is trileaflet and moderately calcified There is some leaflet excursion on PLA/PSA images However the gradients suggest modertate to severe AS and the peak velocity suggests severe AS being . m/sec. Suggest TEE to further clarify. - Mitral valve: Calcified annulus. There was mild regurgitation. - Left atrium: The atrium was mildly dilated. - Atrial septum: No defect or patent foramen ovale was identified.  Echocardiography. M-mode, complete 2D, spectral Doppler, and color Doppler. Birthdate: Patient birthdate: 06-01-1955. Age: Patient is 58 yr old. Sex: Gender: male. Height: Height: 180.3 cm. Height: 71 in. Weight: Weight: 118.4 kg. Weight: 260.5 lb. Body mass index: BMI: 36.4 kg/m^2. Body surface area: BSA: 2.48 m^2. Blood pressure: 172/99 Patient status: Outpatient. Study date: Study date: 08/31/2013. Study time: 10:58  AM. Location: [facility]  -------------------------------------------------------------------  ------------------------------------------------------------------- Left ventricle: The cavity size was moderately dilated. Wall thickness was increased in a pattern of moderate LVH. Systolic function was normal. The estimated ejection fraction was in the range of 55% to 60%. Doppler parameters are consistent with abnormal left ventricular relaxation (grade 1 diastolic dysfunction). Doppler parameters are consistent with elevated mean left atrial filling pressure.  ------------------------------------------------------------------- Aortic valve: AV is trileaflet and moderately calcified There is some leaflet excursion on PLA/PSA images However the gradients suggest modertate to severe AS and the peak velocity suggests severe AS being . m/sec. Suggest TEE to further clarify. Doppler: Valve area (VTI): 1.01 cm^2. Indexed valve area (VTI): 0.41 cm^2/m^2. Valve area (Vmax): 0.78 cm^2. Indexed valve area (Vmax): 0.31 cm^2/m^2. Mean gradient (S): 36 mm Hg. Peak gradient (S): 84 mm Hg.  ------------------------------------------------------------------- Aorta: Ascending aorta: The ascending aorta was mildly dilated.  ------------------------------------------------------------------- Mitral valve: Calcified annulus. Doppler: There was  mild regurgitation. Peak gradient (D): 2 mm Hg.  ------------------------------------------------------------------- Left atrium: The atrium was mildly dilated.  ------------------------------------------------------------------- Atrial septum: No defect or patent foramen ovale was identified.  ------------------------------------------------------------------- Right ventricle: The cavity size was normal. Wall thickness was normal. Systolic function was normal.  ------------------------------------------------------------------- Pulmonic valve:  Doppler: There was mild regurgitation.  ------------------------------------------------------------------- Tricuspid valve: Structurally normal valve. Leaflet separation was normal. Doppler: Transvalvular velocity was within the normal range. There was mild regurgitation.  ------------------------------------------------------------------- Right atrium: The atrium was normal in size.  ------------------------------------------------------------------- Pericardium: The pericardium was normal in appearance.  ------------------------------------------------------------------- Prepared and Electronically Authenticated by  Jenkins Rouge, M.D. 2015-06-12T13:44:36  ------------------------------------------------------------------- Measurements  Left ventricle Value Reference LV ID, ED, PLAX chordal (H) 55.8 mm 43 - 52 LV ID, ES, PLAX chordal (N) 35.7 mm 23 - 38 LV fx shortening, PLAX chordal (N) 36 % >=29 LV PW thickness, ED 14.1 mm --------- IVS/LV PW ratio, ED (N) 0.95 <=1.3 Stroke volume, 2D 103 ml --------- Stroke volume/bsa, 2D 42 ml/m^2 --------- LV e&', lateral 4.28 cm/s --------- LV E/e&', lateral 17.64 --------- LV e&', medial 4.72 cm/s --------- LV E/e&', medial 16 --------- LV e&', average 4.5 cm/s --------- LV E/e&', average 16.78 ---------  Ventricular septum Value Reference IVS thickness, ED 13.4 mm ---------  LVOT Value Reference LVOT ID, S 22 mm --------- LVOT area 3.8 cm^2 --------- LVOT ID 2.2 mm ---------  Aortic valve Value Reference Aortic valve peak velocity, S 457 cm/s --------- Aortic valve mean velocity, S 260 cm/s --------- Aortic valve VTI, S 97.5 cm --------- Aortic mean gradient, S 36 mm Hg --------- Aortic peak gradient, S 84 mm Hg --------- Aortic valve area, VTI 1.01 cm^2 --------- Aortic valve area/bsa, VTI 0.41 cm^2/m^2 --------- Aortic valve area, peak velocity 0.78 cm^2 --------- Aortic valve area/bsa, peak 0.31 cm^2/m^2  --------- velocity  Aorta Value Reference Aortic root ID, ED 38 mm ---------  Left atrium Value Reference LA ID, A-P, ES 48 mm --------- LA ID/bsa, A-P (N) 1.94 cm/m^2 <=2.2  Mitral valve Value Reference Mitral E-wave peak velocity 75.5 cm/s --------- Mitral A-wave peak velocity 99.2 cm/s --------- Mitral deceleration time (H) 363 ms 150 - 230 Mitral peak gradient, D 2 mm Hg --------- Mitral E/A ratio, peak 0.8 ---------  Systemic veins Value Reference Estimated CVP 3 mm Hg ---------  Right ventricle Value Reference RV s&', lateral, S 11.8 cm/s ---------  Legend: (L) and (H) mark values outside specified reference range.  (N) marks values inside specified reference range.    Transesophageal Echocardiography  Patient: Patrick Walter, Patrick Walter MR #: [id number] Study Date: 10/23/2013 Gender: M Age: 45 Height: 180.3 cm Weight: 115.7 kg BSA: 2.45 m^2 Pt. Status: Room:  ORDERING [name] REFERRING [name] SONOGRAPHER Delman Kitten, RCS ADMITTING Loralie Champagne, M.D. ATTENDING Loralie Champagne, M.D. PERFORMING Loralie Champagne, M.D.  cc:  ------------------------------------------------------------------- LV EF: 60% - 65%  ------------------------------------------------------------------- Indications: Aortic stenosis 424.1.  ------------------------------------------------------------------- History: Risk factors: Hypertension.  ------------------------------------------------------------------- Study Conclusions  - Left ventricle: The cavity size was normal. Wall thickness was increased in a pattern of mild LVH. Systolic function was normal. The estimated ejection fraction was in the range of 60% to 65%. Wall motion was normal; there were no regional wall motion abnormalities. - Aortic valve: Heavily calcified aortic valve with fused left and noncoronary cusps (bicuspid). Severe aortic stenosis with mean gradient 68 mmHg and AVA about 0.9 cm^2 (has large  LVOT diameter). Mild aortic insufficiency. Valve area (Vmean): 0.79 cm^2. - Aorta: The aortic root and ascending  aorta are dilated to about 4.0 cm. - Mitral valve: There was trivial regurgitation. - Left atrium: No evidence of thrombus in the atrial cavity or appendage. - Right ventricle: The cavity size was normal. Systolic function was normal. - Right atrium: No evidence of thrombus in the atrial cavity or appendage. - Atrial septum: There was a small PFO seen by color only (Eustachian valve prevented bubbles from getting to it).  Diagnostic transesophageal echocardiography. 2D and color Doppler. Birthdate: Patient birthdate: September 04, 1955. Age: Patient is 58 yr old. Sex: Gender: male. Height: Height: 180.3 cm. Height: 71 in. Weight: Weight: 115.7 kg. Weight: 254.5 lb. Body mass index: BMI: 35.6 kg/m^2. Body surface area: BSA: 2.45 m^2. Blood pressure: 114/77 Patient status: Outpatient. Study date: Study date: 10/23/2013. Study time: 09:10 AM. Location: Endoscopy.  -------------------------------------------------------------------  ------------------------------------------------------------------- Left ventricle: The cavity size was normal. Wall thickness was increased in a pattern of mild LVH. Systolic function was normal. The estimated ejection fraction was in the range of 60% to 65%. Wall motion was normal; there were no regional wall motion abnormalities.  ------------------------------------------------------------------- Aortic valve: Heavily calcified aortic valve with fused left and noncoronary cusps (bicuspid). Severe aortic stenosis with mean gradient 68 mmHg and AVA about 0.9 cm^2 (has large LVOT diameter). Mild aortic insufficiency. Doppler: VTI ratio of LVOT to aortic valve: 0.15. Valve area (VTI): 0.84 cm^2. Indexed valve area (VTI): 0.34 cm^2/m^2. Peak velocity ratio of LVOT to aortic valve: 0.16. Valve area (Vmax): 0.9 cm^2. Indexed valve area (Vmax):  0.37 cm^2/m^2. Mean velocity ratio of LVOT to aortic valve: 0.14. Valve area (Vmean): 0.79 cm^2. Indexed valve area (Vmean): 0.32 cm^2/m^2. Mean gradient (S): 68 mm Hg. Peak gradient (S): 110 mm Hg.  ------------------------------------------------------------------- Aorta: The aortic root and ascending aorta are dilated to about 4.0 cm.  ------------------------------------------------------------------- Mitral valve: Doppler: There was no evidence for stenosis. There was trivial regurgitation.  ------------------------------------------------------------------- Left atrium: The atrium was normal in size. No evidence of thrombus in the atrial cavity or appendage.  ------------------------------------------------------------------- Atrial septum: There was a small PFO seen by color only (Eustachian valve prevented bubbles from getting to it).  ------------------------------------------------------------------- Right ventricle: The cavity size was normal. Systolic function was normal.  ------------------------------------------------------------------- Pulmonic valve: Structurally normal valve. Cusp separation was normal.  ------------------------------------------------------------------- Tricuspid valve: Doppler: There was trivial regurgitation.  ------------------------------------------------------------------- Right atrium: The atrium was normal in size. No evidence of thrombus in the atrial cavity or appendage.  ------------------------------------------------------------------- Pericardium: There was no pericardial effusion.  ------------------------------------------------------------------- Post procedure conclusions Ascending Aorta:  - The aortic root and ascending aorta are dilated to about 4.0 cm.  ------------------------------------------------------------------- Prepared and Electronically Authenticated by  Loralie Champagne,  M.D. 2015-08-17T08:55:08  ------------------------------------------------------------------- Measurements  Left ventricle Value 08/31/2013 Stroke volume, 2D 103 ml 103 Stroke volume/bsa, 2D 42 ml/m^2 42  LVOT Value 08/31/2013 LVOT ID, S 27 mm 22 LVOT area 5.73 cm^2 3.8 LVOT peak velocity, S 82 cm/s ---------- LVOT mean velocity, S 53.3 cm/s ---------- LVOT VTI, S 18 cm ---------- LVOT peak gradient, S 3 mm Hg ---------- LVOT mean gradient, S 1 mm Hg ----------  Aortic valve Value 08/31/2013 Aortic valve peak velocity, S 524 cm/s 457 Aortic valve mean velocity, S 389 cm/s 260 Aortic valve VTI, S 123 cm 97.5 Aortic mean gradient, S 68 mm Hg 36 Aortic peak gradient, S 110 mm Hg 84 VTI ratio, LVOT/AV 0.15 ---------- Aortic valve area, VTI 0.84 cm^2 1.01 Aortic valve area/bsa, VTI 0.34 cm^2/m^2 0.41 Velocity ratio, peak, LVOT/AV 0.16 ---------- Aortic valve  area, peak velocity 0.9 cm^2 0.78 Aortic valve area/bsa, peak velocity 0.37 cm^2/m^2 0.31 Velocity ratio, mean, LVOT/AV 0.14 ---------- Aortic valve area, mean velocity 0.79 cm^2 ---------- Aortic valve area/bsa, mean velocity 0.32 cm^2/m^2 ----------  Legend: (L) and (H) mark values outside specified reference range.  (N) marks values inside specified reference range.   Exercise Treadmill Test  Pre-Exercise Testing Evaluation  Rhythm: normal sinus  Rate: 82 bpm   Test  Exercise Tolerance Test  Ordering MD: [name], MD  Interpreting MD: [name], MD   Unique Test No: 1  Treadmill: 1   Indication for ETT: aortic stenosis  Contraindication to ETT: No   Stress Modality: exercise - treadmill  Cardiac Imaging Performed: non   Protocol: standard Bruce - maximal  Max BP: 181/73   Max MPHR (bpm): 164  85% MPR (bpm): 139   MPHR obtained (bpm): 144  % MPHR obtained: 87   Reached 85% MPHR (min:sec): 5:50  Total Exercise Time (min-sec): 7:00   Workload in METS: 8.5  Borg Scale: 15   Reason ETT Terminated:  dyspnea    ST Segment Analysis  At Rest: normal ST segments - no evidence of significant ST depression  With Exercise: non-specific ST changes  Other Information  Arrhythmia: Yes Angina during ETT: present (1)  Quality of ETT: diagnostic  ETT Interpretation: normal - no evidence of ischemia by ST analysis  Comments:  1. Mild chest discomfort with exertion, resolved completely in early recovery.  2. Nonspecific ST change with exertion, 1 mm flat ST depression during recovery  3. Normal BP response to exercise  4. Few PVC's and one ventricular couplet during exercise  Recommendations:  Pt with known severe aortic stenosis. Recommend cardiac cath in anticipation of cardiac surgery. Reviewed risks, indications, alternatives in detail with the patient.        Cardiac Catheterization Procedure Note  Name: Patrick Walter  MRN: [id number]  DOB: 1955/06/24  Procedure: Right Heart Cath, Left Heart Cath, Selective Coronary Angiography, Aortic root angiography  Indication: This is a 58 year old gentleman with severe symptomatic aortic valve stenosis. He presents for right and left heart catheterization as part of a preoperative evaluation for anticipated cardiac surgery.  Procedural Details: There was an indwelling IV in a right antecubital vein. Using normal sterile technique, the IV was changed out for a 5 Fr brachial sheath over a 0.018 inch wire. The right wrist was then prepped, draped, and anesthetized with 1% lidocaine. Using the modified Seldinger technique a 5/6 French Slender sheath was placed in the right radial artery. Intra-arterial verapamil was administered through the radial artery sheath. IV heparin was administered after a JR4 catheter was advanced into the central aorta. A Swan-Ganz catheter was used for the right heart catheterization. Standard protocol was followed for recording of right heart pressures and sampling of oxygen saturations. Fick cardiac output was calculated.  Standard Judkins catheters were used for selective coronary angiography. A pigtail catheter was used to perform aortic root angiography. There were no immediate procedural complications. The patient was transferred to the post catheterization recovery area for further monitoring.  Procedural Findings:  Hemodynamics  RA 10  RV 33/13  PA 33/12 mean 24  PCWP 15  LV not recorded  AO 111/81  Oxygen saturations:  PA 63  AO 90  Cardiac Output (Fick) 6.3  Cardiac Index (Fick) 2.7  Coronary angiography:  Coronary dominance: right  Left mainstem: The left mainstem is widely patent its without significant stenosis. It divides into the  LAD and left circumflex. The left main arises from the left coronary cusp.  Left anterior descending (LAD): The LAD has mild proximal irregularity with 20-30% stenosis. The mid vessel has diffuse plaquing without any significant obstruction. The diagonal branches are patent.  Left circumflex (LCx): The left circumflex is large in caliber. There is a large first obtuse marginal branch no significant stenosis. The AV circumflex extends down the AV groove and supplies a left posterolateral branch without significant stenosis.  Right coronary artery (RCA): The RCA is a dominant vessel. There are minor irregularities noted but no significant stenosis throughout the proximal, mid, or distal vessel. The PDA and PLA branches are patent.  Aortic root angiography: There is very severe calcification of the aortic valve with extremely restricted mobility. The aortic root and proximal ascending aorta are dilated.  Contrast: 95 cc Omnipaque  Radiation dose/Fluoro time: 7.6 minutes  Estimated Blood Loss: minimal  Final Conclusions:  1. Severely calcified aortic valve with known severe AS  2. Patent coronary arteries with minor diffuse nonobstructive disease  3. Normal right heart hemodynamics  4. Dilated aortic root  Recommendations: Cardiac surgery referral. Ascending aorta does  not appear to be severely dilated, but will order CTA of the chest to evaluate aortic dimensions in this patient with a known bicuspid Ao valve.  [name] MD, [facility]  11/08/2013, 8:23 AM    Cardiac-Gated CTA Heart TECHNIQUE:  The patient was scanned on a Philips 256 scanner. A 120 kV  retrospective scan was triggered in the descending thoracic aorta at  111 HU's. Gantry rotation speed was 270 msecs and collimation was .9  mm. No beta blockade or nitro were given. The 3D data set was  reconstructed in 5% intervals of the R-R cycle. Systolic and  diastolic phases were analyzed on a dedicated work station using  MPR, MIP and VRT modes. The patient received 80 cc of contrast.  FINDINGS:  Aortic Valve: Trileaflet aortic valve with partially fused right and  non-coronary leaflets - functionally bileaflet valve. Severely  calcified leaflets with severely restricted leaflet opening.  Aorta: Aortic root, sinotubular junction and ascending thoracic arch  are mildly dilated. There is no dissection, no calcifications.  Sinotubular Junction: 34 x 34 mm  Ascending Thoracic Aorta: 40 x 40 mm  Aortic Arch: 33 x 32 mm  Descending Thoracic Aorta: 33 x 32 mm  Sinus of Valsalva Measurements:  Non-coronary: 39 mm  Right -coronary: 37 mm  Left -coronary: 39 mm  Coronary Artery Height above Annulus:  Left Main: 14 mm  Right Coronary: 20 mm  Virtual Basal Annulus Measurements:  Maximum/Minimum Diameter: 34 x 28 mm  Perimeter: 106 mm  Area: 752 mm2  Coronary Arteries: Originating in normal position. Right dominance.  No significant plaque.  IMPRESSION:  1. Functionally bicuspid aortic valve with severely calcified  leaflets and severely limited leaflet opening. Aortic annular  measurements exceed currently available Edward-Sapien XT TAVR.  2. Aortic root, sinotubular junction and ascending thoracic arch are  mildly dilated.  Ena Dawley  Electronically Signed  By: Ena Dawley  On:  11/14/2013 19:52       Study Result    EXAM:  OVER-READ INTERPRETATION CT CHEST  The following report is an over-read performed by radiologist Dr.  Alvino Blood [facility] on 11/14/2013. This  over-read does not include interpretation of cardiac or coronary  anatomy or pathology. The coronary calcium score/coronary CTA  interpretation by the cardiologist is attached.  COMPARISON: None.  FINDINGS:  Review of the lung parenchyma demonstrates no nodularity. The  entirety of the lung parenchyma is not imaged. No mediastinal  lymphadenopathy. Esophagus is normal. No aggressive osseous lesion.  IMPRESSION:  No significant extra thoracic findings.  Electronically Signed:  By: Suzy Bouchard M.D.  On: 11/14/2013 15:55      Impression:  Patient has stage D severe symptomatic aortic stenosis with a bicuspid native aortic valve.  Left ventricular systolic function is preserved.  Cardiac catheterization is notable for the absence of significant coronary artery disease.  Cardiac gated CT angiogram confirms only mild dilatation of the proximal aorta with maximum transverse diameter approximately 3.9 - 4.0 cm.  I agree that the patient would best be treated with elective aortic valve replacement.   Plan:  The patient and his wife were counseled at length regarding surgical alternatives with respect to aortic valve replacement including continued medical therapy versus proceeding with conventional surgical aortic valve replacement using either a mechanical prosthesis or a bioprosthetic tissue valve.  Other alternatives including the Ross autograft procedure, homograft aortic root replacement, stentless bioprosthetic tissue valve replacement, valve repair, and transcatheter aortic valve replacement were discussed.  Discussion was held comparing the relative risks of mechanical valve replacement with need for lifelong anticoagulation versus use of a bioprosthetic tissue valve and  the associated potential for late structural valve deterioration in failure.  This discussion was placed in the context of the patient's particular circumstances, and as a result the patient specifically requests that their valve be replaced using a bioprosthetic tissue valve.  Alternative surgical approaches have been discussed including a comparison between conventional sternotomy and minimally-invasive techniques.  The relative risks and benefits of each have been reviewed as they pertain to the patient's specific circumstances, and all of their questions have been addressed.  The patient understands and accepts all potential associated risks of surgery including but not limited to risk of death, stroke, myocardial infarction, congestive heart failure, respiratory failure, renal failure, pneumonia, bleeding requiring blood transfusion and or reexploration, arrhythmia, heart block or bradycardia requiring permanent pacemaker, aortic dissection or other major vascular complication, pleural effusions or other delayed complications related to continued congestive heart failure, and other late complications related to valve replacement including structural valve deterioration and failure, thrombosis, endocarditis, or paravalvular leak.  We tentatively plan to proceed with aortic valve replacement using a bioprosthetic tissue valve via conventional median sternotomy on Wednesday, 12/05/2013.  The patient will return for followup on Monday, 12/03/2013 prior to surgery. He has been instructed to stop taking aspirin and fish oil capsules between now and the time of surgery. All of his questions have been answered.     I spent in excess of 90 minutes during the conduct of this office consultation and >50% of this time involved direct face-to-face encounter with the patient for counseling and/or coordination of their care.   Valentina Gu. Roxy Manns, MD 11/16/2013 1:29 PM

## 2013-11-16 NOTE — Patient Instructions (Signed)
Stop taking aspirin and fish oil capsules in anticipation of your surgery

## 2013-11-28 ENCOUNTER — Encounter (HOSPITAL_COMMUNITY): Payer: Self-pay

## 2013-12-03 ENCOUNTER — Encounter (HOSPITAL_COMMUNITY)
Admission: RE | Admit: 2013-12-03 | Discharge: 2013-12-03 | Disposition: A | Discharge status: Home / Self Care | Payer: 59 | Source: Ambulatory Visit | Attending: Thoracic Surgery (Cardiothoracic Vascular Surgery) | Admitting: Thoracic Surgery (Cardiothoracic Vascular Surgery)

## 2013-12-03 ENCOUNTER — Ambulatory Visit (HOSPITAL_COMMUNITY)
Admission: RE | Admit: 2013-12-03 | Discharge: 2013-12-03 | Disposition: A | Discharge status: Home / Self Care | Payer: 59 | Source: Ambulatory Visit | Attending: Thoracic Surgery (Cardiothoracic Vascular Surgery) | Admitting: Thoracic Surgery (Cardiothoracic Vascular Surgery)

## 2013-12-03 ENCOUNTER — Ambulatory Visit (INDEPENDENT_AMBULATORY_CARE_PROVIDER_SITE_OTHER): Payer: 59 | Admitting: Thoracic Surgery (Cardiothoracic Vascular Surgery)

## 2013-12-03 ENCOUNTER — Encounter: Payer: Self-pay | Admitting: Thoracic Surgery (Cardiothoracic Vascular Surgery)

## 2013-12-03 ENCOUNTER — Encounter (HOSPITAL_COMMUNITY): Payer: Self-pay

## 2013-12-03 VITALS — BP 110/74 | HR 73 | Temp 97.7°F | Resp 18 | Ht 71.0 in | Wt 251.2 lb

## 2013-12-03 VITALS — BP 124/81 | HR 70 | Ht 71.0 in | Wt 254.0 lb

## 2013-12-03 DIAGNOSIS — I350 Nonrheumatic aortic (valve) stenosis: Secondary | ICD-10-CM

## 2013-12-03 DIAGNOSIS — I359 Nonrheumatic aortic valve disorder, unspecified: Secondary | ICD-10-CM | POA: Diagnosis present

## 2013-12-03 DIAGNOSIS — Z01810 Encounter for preprocedural cardiovascular examination: Secondary | ICD-10-CM

## 2013-12-03 DIAGNOSIS — Q231 Congenital insufficiency of aortic valve: Secondary | ICD-10-CM

## 2013-12-03 HISTORY — DX: Cardiac murmur, unspecified: R01.1

## 2013-12-03 LAB — COMPREHENSIVE METABOLIC PANEL
ALT: 21 U/L (ref 0–53)
AST: 19 U/L (ref 0–37)
Albumin: 3.9 g/dL (ref 3.5–5.2)
Alkaline Phosphatase: 54 U/L (ref 39–117)
Anion gap: 15 (ref 5–15)
BUN: 29 mg/dL — ABNORMAL HIGH (ref 6–23)
CHLORIDE: 101 meq/L (ref 96–112)
CO2: 21 meq/L (ref 19–32)
Calcium: 10.2 mg/dL (ref 8.4–10.5)
Creatinine, Ser: 1.15 mg/dL (ref 0.50–1.35)
GFR calc Af Amer: 80 mL/min — ABNORMAL LOW (ref >90)
GFR, EST NON AFRICAN AMERICAN: 69 mL/min — AB (ref >90)
Glucose, Bld: 125 mg/dL — ABNORMAL HIGH (ref 70–99)
Potassium: 4.2 mEq/L (ref 3.7–5.3)
Sodium: 137 mEq/L (ref 137–147)
Total Bilirubin: 0.7 mg/dL (ref 0.3–1.2)
Total Protein: 7.5 g/dL (ref 6.0–8.3)

## 2013-12-03 LAB — URINALYSIS, ROUTINE W REFLEX MICROSCOPIC
Bilirubin Urine: NEGATIVE
Glucose, UA: NEGATIVE mg/dL
HGB URINE DIPSTICK: NEGATIVE
KETONES UR: NEGATIVE mg/dL
Leukocytes, UA: NEGATIVE
Nitrite: NEGATIVE
PROTEIN: NEGATIVE mg/dL
Specific Gravity, Urine: 1.022 (ref 1.005–1.030)
UROBILINOGEN UA: 0.2 mg/dL (ref 0.0–1.0)
pH: 5 (ref 5.0–8.0)

## 2013-12-03 LAB — PULMONARY FUNCTION TEST
DL/VA % PRED: 127 %
DL/VA: 5.98 ml/min/mmHg/L
DLCO COR % PRED: 112 %
DLCO cor: 37.95 ml/min/mmHg
DLCO unc % pred: 110 %
DLCO unc: 37.18 ml/min/mmHg
FEF 25-75 POST: 4.87 L/s
FEF 25-75 Pre: 2.87 L/sec
FEF2575-%CHANGE-POST: 69 %
FEF2575-%PRED-PRE: 88 %
FEF2575-%Pred-Post: 149 %
FEV1-%Change-Post: 15 %
FEV1-%PRED-POST: 93 %
FEV1-%PRED-PRE: 81 %
FEV1-POST: 3.61 L
FEV1-Pre: 3.13 L
FEV1FVC-%CHANGE-POST: 1 %
FEV1FVC-%PRED-PRE: 104 %
FEV6-%Change-Post: 15 %
FEV6-%PRED-PRE: 80 %
FEV6-%Pred-Post: 92 %
FEV6-Post: 4.5 L
FEV6-Pre: 3.91 L
FEV6FVC-%Change-Post: 1 %
FEV6FVC-%Pred-Post: 104 %
FEV6FVC-%Pred-Pre: 102 %
FVC-%CHANGE-POST: 13 %
FVC-%Pred-Post: 89 %
FVC-%Pred-Pre: 78 %
FVC-PRE: 3.96 L
FVC-Post: 4.51 L
POST FEV1/FVC RATIO: 80 %
PRE FEV1/FVC RATIO: 79 %
PRE FEV6/FVC RATIO: 99 %
Post FEV6/FVC ratio: 100 %
RV % PRED: 89 %
RV: 1.99 L
TLC % pred: 91 %
TLC: 6.58 L

## 2013-12-03 LAB — BLOOD GAS, ARTERIAL
Acid-Base Excess: 0.8 mmol/L (ref 0.0–2.0)
Bicarbonate: 24.8 mEq/L — ABNORMAL HIGH (ref 20.0–24.0)
DRAWN BY: 206361
FIO2: 0.21 %
O2 Saturation: 95.6 %
PCO2 ART: 39.1 mmHg (ref 35.0–45.0)
Patient temperature: 98.6
TCO2: 26 mmol/L (ref 0–100)
pH, Arterial: 7.419 (ref 7.350–7.450)
pO2, Arterial: 76.4 mmHg — ABNORMAL LOW (ref 80.0–100.0)

## 2013-12-03 LAB — CBC
HCT: 40.9 % (ref 39.0–52.0)
Hemoglobin: 13.9 g/dL (ref 13.0–17.0)
MCH: 28.7 pg (ref 26.0–34.0)
MCHC: 34 g/dL (ref 30.0–36.0)
MCV: 84.3 fL (ref 78.0–100.0)
PLATELETS: 213 10*3/uL (ref 150–400)
RBC: 4.85 MIL/uL (ref 4.22–5.81)
RDW: 13.4 % (ref 11.5–15.5)
WBC: 7.6 10*3/uL (ref 4.0–10.5)

## 2013-12-03 LAB — PROTIME-INR
INR: 1.03 (ref 0.00–1.49)
Prothrombin Time: 13.5 seconds (ref 11.6–15.2)

## 2013-12-03 LAB — TYPE AND SCREEN
ABO/RH(D): A NEG
ANTIBODY SCREEN: NEGATIVE

## 2013-12-03 LAB — ABO/RH: ABO/RH(D): A NEG

## 2013-12-03 LAB — APTT: aPTT: 41 seconds — ABNORMAL HIGH (ref 24–37)

## 2013-12-03 LAB — SURGICAL PCR SCREEN
MRSA, PCR: NEGATIVE
Staphylococcus aureus: NEGATIVE

## 2013-12-03 MED ORDER — ALBUTEROL SULFATE (2.5 MG/3ML) 0.083% IN NEBU
2.5000 mg | INHALATION_SOLUTION | Freq: Once | RESPIRATORY_TRACT | Status: AC
  Administered 2013-12-03: 2.5 mg via RESPIRATORY_TRACT

## 2013-12-03 NOTE — Patient Instructions (Signed)
Nothing to eat or drink after midnight the night before surgery Do not take any medications on the morning of surgery  

## 2013-12-03 NOTE — Pre-Procedure Instructions (Signed)
Patrick Walter  12/03/2013   Your procedure is scheduled on:  Wednesday, September 16.  Report to [facility]mitting at 6:30 AM.  Call this number if you have problems the morning of surgery: [phone]   Remember:   Do not eat food or drink liquids after midnight Tuesday, September 15.   Take these medicines the morning of surgery with A SIP OF WATER: None.              Stop all Vitamins and Herbal medication (fish oil).    Do not wear jewelry, make-up or nail polish.  Do not wear lotions, powders, or perfumes.   Do not shave 48 hours prior to surgery. Men may shave face and neck.  Do not bring valuables to the hospital.             [facility] is not responsible for any belongings or valuables.               Contacts, dentures or bridgework may not be worn into surgery.  Leave suitcase in the car. After surgery it may be brought to your room.  For patients admitted to the hospital, discharge time is determined by your  treatment team.               Special Instructions: Review  [facility] - Preparing For Surgery.   Please read over the following fact sheets that you were given: Pain Booklet, Coughing and Deep Breathing, Blood Transfusion Information and Surgical Site Infection Prevention and Incentive Spirometer.

## 2013-12-03 NOTE — H&P (Signed)
[address]       [place] [address]             [phone]          CARDIOTHORACIC SURGERY HISTORY AND PHYSICAL EXAM  Referring Provider is [name], MD PCP is Aundria Mems, MD    Chief Complaint   Patient presents with   .  Aortic Stenosis       referred by Dr. Brigid Re  11/08/13.Marland KitchenMarland KitchenTEE  10/23/13     HPI:  Patient is a 58 year old white male with recently discovered bicuspid aortic valve and severe aortic stenosis referred for surgical consultation. The patient states that he was told he had a heart murmur many years ago, but he never underwent any sort of formal cardiac evaluation. He has remained otherwise healthy and reasonably active physically for all of his life.  He was recently evaluated by his primary care physician and noted to have a prominent systolic murmur on routine physical exam. An echocardiogram was performed demonstrating what was reported to be moderate to severe aortic stenosis, although the peak velocity across the aortic valve was measured > 4.5 m/sec. He was referred to Dr. Burt Knack for consultation.  At the time the patient denied any symptoms of exertional shortness of breath or chest discomfort, but he admitted that he had not been exercising regularly for quite some time. Review of the patient's transthoracic echocardiogram suggested the possibility that the patient might have severe aortic stenosis with a congenitally bicuspid valve.  He subsequently underwent transesophageal echocardiogram and exercise stress testing. Transesophageal echocardiogram confirmed the presence of severe aortic stenosis with congenital a bicuspid aortic valve.  Peak velocity across the aortic valve measured > 5 m/sec and the mean transvalvular gradient was estimated to be 68 mmHg.  On exercise treadmill the patient developed exertional chest discomfort early into stage III of a conventional Bruce protocol.  The patient subsequently underwent  left and right heart catheterization which revealed only minor diffuse nonobstructive coronary artery disease and normal pulmonary artery pressures. The aortic root appeared somewhat dilated at catheterization.  The patient was scheduled for cardiac gated CT angiogram of the heart to rule out significant aneurysmal enlargement of the aortic root and proximal descending thoracic aorta and referred for elective surgical consultation.  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 He has remained active for most of his life, although he admits that he does not participate in any type of aerobic exercise on a regular basis.  He denies any symptoms of exertional shortness of breath or chest discomfort, although he admit that he did develop chest tightness during his recent stress test.  He denies any history of resting shortness of breath, PND, orthopnea, or lower extremity edema. He has not had any tachypalpitations or dizzy spells.  The patient was originally seen in consultation 11/16/2013. Since then he reports no new problems or complaints, although he did develop a rash related to poison ivy on his right forearm and right leg. This has almost completely resolved with conservative measures.    Past Medical History  Diagnosis Date  . Hypertension   . Bicuspid aortic valve   . Aortic stenosis 07/24/2013  . Coronary artery disease     Past Surgical History  Procedure Laterality Date  . Vasectomy  06/2002  .  Hand surgery  07/1977  . Tee without cardioversion N/A 10/23/2013    Procedure: TRANSESOPHAGEAL ECHOCARDIOGRAM (TEE);  Surgeon: Larey Dresser, MD;  Location: [facility] ENDOSCOPY;  Service: Cardiovascular;  Laterality: N/A;    Family History  Problem Relation Age of Onset  . Diabetes Mother   . Cancer Mother     colon  .  Diabetes Father   . Cancer Father     colon  . Cancer Daughter     leukemia  . Heart attack Maternal Grandmother   . Hypertension Maternal Grandmother   . Diabetes Maternal Grandmother   . Cancer Maternal Grandmother   . Diabetes Maternal Grandfather   . Cancer Maternal Grandfather   . Diabetes Paternal Grandmother   . Cancer Paternal Grandmother   . Stroke Paternal Grandfather   . Diabetes Paternal Grandfather   . Cancer Paternal Grandfather     Social History History  Substance Use Topics  . Smoking status: Never Smoker   . Smokeless tobacco: Not on file  . Alcohol Use: Yes    Prior to Admission medications   Medication Sig Start Date End Date Taking? Authorizing Provider  aspirin EC 81 MG tablet Take 1 tablet (81 mg total) by mouth daily. 09/04/13  Yes [name], MD  lisinopril-hydrochlorothiazide (PRINZIDE,ZESTORETIC) 20-25 MG per tablet Take 1 tablet by mouth daily. 09/25/13  Yes [name], MD  Multiple Vitamin (MULTIVITAMIN WITH MINERALS) TABS tablet Take 1 tablet by mouth daily.   Yes Historical Provider, MD  Omega-3 Fatty Acids (FISH OIL) 1000 MG CAPS Take 3,000 mg by mouth daily.    Yes Historical Provider, MD    No Known Allergies   Review of Systems:              General:                      normal appetite, normal energy, no weight gain, no weight loss, no fever             Cardiac:                      + chest pain with exertion, no chest pain at rest, no SOB with exertion, no resting SOB, no PND, no orthopnea, no palpitations, no arrhythmia, no atrial fibrillation, no LE edema, no dizzy spells, no syncope             Respiratory:                no shortness of breath, no home oxygen, no productive cough, no dry cough, no bronchitis, no wheezing, no hemoptysis, no asthma, no pain with inspiration or cough, no sleep apnea, no CPAP at night             GI:                                no difficulty swallowing, no reflux, no frequent  heartburn, no hiatal hernia, no abdominal pain, no constipation, no diarrhea, no hematochezia, no hematemesis, no melena             GU:                              no dysuria,  no frequency, no urinary tract infection, no hematuria, no enlarged prostate, no kidney stones, no  kidney disease             Vascular:                     no pain suggestive of claudication, no pain in feet, no leg cramps, no varicose veins, no DVT, no non-healing foot ulcer             Neuro:                         no stroke, no TIA's, no seizures, no headaches, no temporary blindness one eye,  no slurred speech, no peripheral neuropathy, no chronic pain, no instability of gait, no memory/cognitive dysfunction             Musculoskeletal:         no arthritis, no joint swelling, no myalgias, no difficulty walking, normal mobility               Skin:                            no rash, no itching, no skin infections, no pressure sores or ulcerations             Psych:                         no anxiety, no depression, no nervousness, no unusual recent stress             Eyes:                           no blurry vision, no floaters, no recent vision changes, + wears glasses or contacts             ENT:                            no hearing loss, no loose or painful teeth, no dentures, last saw dentist 2010             Hematologic:               no easy bruising, no abnormal bleeding, no clotting disorder, no frequent epistaxis             Endocrine:                   no diabetes, does not check CBG's at home                           Physical Exam:              BP 144/89  Pulse 63  Resp 16  Ht 5\' 11"  (1.803 m)  Wt 254 lb (115.214 kg)  BMI 35.44 kg/m2  SpO2 97%             General:                      Moderately obese but o/w  well-appearing             HEENT:                       Unremarkable  Neck:                           no JVD, no bruits, no adenopathy               Chest:                          clear to auscultation, symmetrical breath sounds, no wheezes, no rhonchi               CV:                              RRR, grade III/VI crescendo/decrescendo systolic murmur               Abdomen:                    soft, non-tender, no masses               Extremities:                 warm, well-perfused, pulses palpable, no LE edema             Rectal/GU                   Deferred             Neuro:                         Grossly non-focal and symmetrical throughout             Skin:                            Clean and dry, no rashes, no breakdown   Diagnostic Tests:  Transthoracic Echocardiography  Patient: Stirling, Orton MR #: [id number] Study Date: 08/31/2013 Gender: M Age: 23 Height: 180.3 cm Weight: 118.4 kg BSA: 2.48 m^2 Pt. Status: Room:  ATTENDING Jenkins Rouge, M.D. SONOGRAPHER Charlann Noss, RDCS ORDERING [name] REFERRING [name] PERFORMING [facility] Outpatient  cc:  ------------------------------------------------------------------- LV EF: 55% - 60%  ------------------------------------------------------------------- Indications: Murmur 785.2.  ------------------------------------------------------------------- History: PMH: Acquired from the patient and from the patient&'s chart. Murmur. Risk factors: Hypertension.  ------------------------------------------------------------------- Study Conclusions  - Left ventricle: The cavity size was moderately dilated. Wall thickness was increased in a pattern of moderate LVH. Systolic function was normal. The estimated ejection fraction was in the range of 55% to 60%. Doppler parameters are consistent with abnormal left ventricular relaxation (grade 1 diastolic dysfunction). Doppler parameters are consistent with elevated mean left atrial filling pressure. - Aortic valve: AV is trileaflet and moderately calcified There is some leaflet excursion on PLA/PSA images However the  gradients suggest modertate to severe AS and the peak velocity suggests severe AS being . m/sec. Suggest TEE to further clarify. - Mitral valve: Calcified annulus. There was mild regurgitation. - Left atrium: The atrium was mildly dilated. - Atrial septum: No defect or patent foramen ovale was identified.  Echocardiography. M-mode, complete 2D, spectral Doppler, and color Doppler. Birthdate: Patient birthdate: 05/04/1955. Age: Patient is 58 yr old. Sex: Gender: male. Height: Height: 180.3 cm. Height: 71 in. Weight: Weight: 118.4 kg. Weight: 260.5 lb. Body mass index: BMI: 36.4 kg/m^2. Body surface area: BSA: 2.48 m^2. Blood pressure: 172/99 Patient  status: Outpatient. Study date: Study date: 08/31/2013. Study time: 10:58 AM. Location: [facility]  -------------------------------------------------------------------  ------------------------------------------------------------------- Left ventricle: The cavity size was moderately dilated. Wall thickness was increased in a pattern of moderate LVH. Systolic function was normal. The estimated ejection fraction was in the range of 55% to 60%. Doppler parameters are consistent with abnormal left ventricular relaxation (grade 1 diastolic dysfunction). Doppler parameters are consistent with elevated mean left atrial filling pressure.  ------------------------------------------------------------------- Aortic valve: AV is trileaflet and moderately calcified There is some leaflet excursion on PLA/PSA images However the gradients suggest modertate to severe AS and the peak velocity suggests severe AS being . m/sec. Suggest TEE to further clarify. Doppler: Valve area (VTI): 1.01 cm^2. Indexed valve area (VTI): 0.41 cm^2/m^2. Valve area (Vmax): 0.78 cm^2. Indexed valve area (Vmax): 0.31 cm^2/m^2. Mean gradient (S): 36 mm Hg. Peak gradient (S): 84 mm Hg.  ------------------------------------------------------------------- Aorta:  Ascending aorta: The ascending aorta was mildly dilated.  ------------------------------------------------------------------- Mitral valve: Calcified annulus. Doppler: There was mild regurgitation. Peak gradient (D): 2 mm Hg.  ------------------------------------------------------------------- Left atrium: The atrium was mildly dilated.  ------------------------------------------------------------------- Atrial septum: No defect or patent foramen ovale was identified.  ------------------------------------------------------------------- Right ventricle: The cavity size was normal. Wall thickness was normal. Systolic function was normal.  ------------------------------------------------------------------- Pulmonic valve: Doppler: There was mild regurgitation.  ------------------------------------------------------------------- Tricuspid valve: Structurally normal valve. Leaflet separation was normal. Doppler: Transvalvular velocity was within the normal range. There was mild regurgitation.  ------------------------------------------------------------------- Right atrium: The atrium was normal in size.  ------------------------------------------------------------------- Pericardium: The pericardium was normal in appearance.  ------------------------------------------------------------------- Prepared and Electronically Authenticated by  Jenkins Rouge, M.D. 2015-06-12T13:44:36  ------------------------------------------------------------------- Measurements  Left ventricle Value Reference LV ID, ED, PLAX chordal (H) 55.8 mm 43 - 52 LV ID, ES, PLAX chordal (N) 35.7 mm 23 - 38 LV fx shortening, PLAX chordal (N) 36 % >=29 LV PW thickness, ED 14.1 mm --------- IVS/LV PW ratio, ED (N) 0.95 <=1.3 Stroke volume, 2D 103 ml --------- Stroke volume/bsa, 2D 42 ml/m^2 --------- LV e&', lateral 4.28 cm/s --------- LV E/e&', lateral 17.64 --------- LV e&', medial 4.72 cm/s --------- LV  E/e&', medial 16 --------- LV e&', average 4.5 cm/s --------- LV E/e&', average 16.78 ---------  Ventricular septum Value Reference IVS thickness, ED 13.4 mm ---------  LVOT Value Reference LVOT ID, S 22 mm --------- LVOT area 3.8 cm^2 --------- LVOT ID 2.2 mm ---------  Aortic valve Value Reference Aortic valve peak velocity, S 457 cm/s --------- Aortic valve mean velocity, S 260 cm/s --------- Aortic valve VTI, S 97.5 cm --------- Aortic mean gradient, S 36 mm Hg --------- Aortic peak gradient, S 84 mm Hg --------- Aortic valve area, VTI 1.01 cm^2 --------- Aortic valve area/bsa, VTI 0.41 cm^2/m^2 --------- Aortic valve area, peak velocity 0.78 cm^2 --------- Aortic valve area/bsa, peak 0.31 cm^2/m^2 --------- velocity  Aorta Value Reference Aortic root ID, ED 38 mm ---------  Left atrium Value Reference LA ID, A-P, ES 48 mm --------- LA ID/bsa, A-P (N) 1.94 cm/m^2 <=2.2  Mitral valve Value Reference Mitral E-wave peak velocity 75.5 cm/s --------- Mitral A-wave peak velocity 99.2 cm/s --------- Mitral deceleration time (H) 363 ms 150 - 230 Mitral peak gradient, D 2 mm Hg --------- Mitral E/A ratio, peak 0.8 ---------  Systemic veins Value Reference Estimated CVP 3 mm Hg ---------  Right ventricle Value Reference RV s&', lateral, S 11.8 cm/s ---------  Legend: (L) and (H) mark values outside specified reference range.  (N) marks values inside specified  reference range.    Transesophageal Echocardiography  Patient: Kaidence, Callaway MR #: [id number] Study Date: 10/23/2013 Gender: M Age: 23 Height: 180.3 cm Weight: 115.7 kg BSA: 2.45 m^2 Pt. Status: Room:  ORDERING [name] REFERRING [name] SONOGRAPHER Delman Kitten, RCS ADMITTING Loralie Champagne, M.D. ATTENDING Loralie Champagne, M.D. PERFORMING Loralie Champagne, M.D.  cc:  ------------------------------------------------------------------- LV EF: 60% -  65%  ------------------------------------------------------------------- Indications: Aortic stenosis 424.1.  ------------------------------------------------------------------- History: Risk factors: Hypertension.  ------------------------------------------------------------------- Study Conclusions  - Left ventricle: The cavity size was normal. Wall thickness was increased in a pattern of mild LVH. Systolic function was normal. The estimated ejection fraction was in the range of 60% to 65%. Wall motion was normal; there were no regional wall motion abnormalities. - Aortic valve: Heavily calcified aortic valve with fused left and noncoronary cusps (bicuspid). Severe aortic stenosis with mean gradient 68 mmHg and AVA about 0.9 cm^2 (has large LVOT diameter). Mild aortic insufficiency. Valve area (Vmean): 0.79 cm^2. - Aorta: The aortic root and ascending aorta are dilated to about 4.0 cm. - Mitral valve: There was trivial regurgitation. - Left atrium: No evidence of thrombus in the atrial cavity or appendage. - Right ventricle: The cavity size was normal. Systolic function was normal. - Right atrium: No evidence of thrombus in the atrial cavity or appendage. - Atrial septum: There was a small PFO seen by color only (Eustachian valve prevented bubbles from getting to it).  Diagnostic transesophageal echocardiography. 2D and color Doppler. Birthdate: Patient birthdate: 1955-12-12. Age: Patient is 58 yr old. Sex: Gender: male. Height: Height: 180.3 cm. Height: 71 in. Weight: Weight: 115.7 kg. Weight: 254.5 lb. Body mass index: BMI: 35.6 kg/m^2. Body surface area: BSA: 2.45 m^2. Blood pressure: 114/77 Patient status: Outpatient. Study date: Study date: 10/23/2013. Study time: 09:10 AM. Location: Endoscopy.  -------------------------------------------------------------------  ------------------------------------------------------------------- Left ventricle: The cavity size was  normal. Wall thickness was increased in a pattern of mild LVH. Systolic function was normal. The estimated ejection fraction was in the range of 60% to 65%. Wall motion was normal; there were no regional wall motion abnormalities.  ------------------------------------------------------------------- Aortic valve: Heavily calcified aortic valve with fused left and noncoronary cusps (bicuspid). Severe aortic stenosis with mean gradient 68 mmHg and AVA about 0.9 cm^2 (has large LVOT diameter). Mild aortic insufficiency. Doppler: VTI ratio of LVOT to aortic valve: 0.15. Valve area (VTI): 0.84 cm^2. Indexed valve area (VTI): 0.34 cm^2/m^2. Peak velocity ratio of LVOT to aortic valve: 0.16. Valve area (Vmax): 0.9 cm^2. Indexed valve area (Vmax): 0.37 cm^2/m^2. Mean velocity ratio of LVOT to aortic valve: 0.14. Valve area (Vmean): 0.79 cm^2. Indexed valve area (Vmean): 0.32 cm^2/m^2. Mean gradient (S): 68 mm Hg. Peak gradient (S): 110 mm Hg.  ------------------------------------------------------------------- Aorta: The aortic root and ascending aorta are dilated to about 4.0 cm.  ------------------------------------------------------------------- Mitral valve: Doppler: There was no evidence for stenosis. There was trivial regurgitation.  ------------------------------------------------------------------- Left atrium: The atrium was normal in size. No evidence of thrombus in the atrial cavity or appendage.  ------------------------------------------------------------------- Atrial septum: There was a small PFO seen by color only (Eustachian valve prevented bubbles from getting to it).  ------------------------------------------------------------------- Right ventricle: The cavity size was normal. Systolic function was normal.  ------------------------------------------------------------------- Pulmonic valve: Structurally normal valve. Cusp separation  was normal.  ------------------------------------------------------------------- Tricuspid valve: Doppler: There was trivial regurgitation.  ------------------------------------------------------------------- Right atrium: The atrium was normal in size. No evidence of thrombus in the atrial cavity or appendage.  ------------------------------------------------------------------- Pericardium: There was no pericardial effusion.  -------------------------------------------------------------------  Post procedure conclusions Ascending Aorta:  - The aortic root and ascending aorta are dilated to about 4.0 cm.  ------------------------------------------------------------------- Prepared and Electronically Authenticated by  Loralie Champagne, M.D. 2015-08-17T08:55:08  ------------------------------------------------------------------- Measurements  Left ventricle Value 08/31/2013 Stroke volume, 2D 103 ml 103 Stroke volume/bsa, 2D 42 ml/m^2 42  LVOT Value 08/31/2013 LVOT ID, S 27 mm 22 LVOT area 5.73 cm^2 3.8 LVOT peak velocity, S 82 cm/s ---------- LVOT mean velocity, S 53.3 cm/s ---------- LVOT VTI, S 18 cm ---------- LVOT peak gradient, S 3 mm Hg ---------- LVOT mean gradient, S 1 mm Hg ----------  Aortic valve Value 08/31/2013 Aortic valve peak velocity, S 524 cm/s 457 Aortic valve mean velocity, S 389 cm/s 260 Aortic valve VTI, S 123 cm 97.5 Aortic mean gradient, S 68 mm Hg 36 Aortic peak gradient, S 110 mm Hg 84 VTI ratio, LVOT/AV 0.15 ---------- Aortic valve area, VTI 0.84 cm^2 1.01 Aortic valve area/bsa, VTI 0.34 cm^2/m^2 0.41 Velocity ratio, peak, LVOT/AV 0.16 ---------- Aortic valve area, peak velocity 0.9 cm^2 0.78 Aortic valve area/bsa, peak velocity 0.37 cm^2/m^2 0.31 Velocity ratio, mean, LVOT/AV 0.14 ---------- Aortic valve area, mean velocity 0.79 cm^2 ---------- Aortic valve area/bsa, mean velocity 0.32 cm^2/m^2 ----------  Legend: (L) and (H) mark values  outside specified reference range.  (N) marks values inside specified reference range.   Exercise Treadmill Test   Pre-Exercise Testing Evaluation   Rhythm: normal sinus   Rate: 82 bpm    Test   Exercise Tolerance Test   Ordering MD: [name], MD   Interpreting MD: [name], MD    Unique Test No: 1   Treadmill: 1    Indication for ETT: aortic stenosis   Contraindication to ETT: No    Stress Modality: exercise - treadmill   Cardiac Imaging Performed: non    Protocol: standard Bruce - maximal   Max BP: 181/73    Max MPHR (bpm): 164   85% MPR (bpm): 139    MPHR obtained (bpm): 144   % MPHR obtained: 87    Reached 85% MPHR (min:sec): 5:50   Total Exercise Time (min-sec): 7:00    Workload in METS: 8.5   Borg Scale: 15    Reason ETT Terminated: dyspnea      ST Segment Analysis   At Rest: normal ST segments - no evidence of significant ST depression   With Exercise: non-specific ST changes   Other Information   Arrhythmia: Yes Angina during ETT: present (1)   Quality of ETT: diagnostic   ETT Interpretation: normal - no evidence of ischemia by ST analysis   Comments:  1. Mild chest discomfort with exertion, resolved completely in early recovery.   2. Nonspecific ST change with exertion, 1 mm flat ST depression during recovery   3. Normal BP response to exercise   4. Few PVC's and one ventricular couplet during exercise   Recommendations:   Pt with known severe aortic stenosis. Recommend cardiac cath in anticipation of cardiac surgery. Reviewed risks, indications, alternatives in detail with the patient.         Cardiac Catheterization Procedure Note  Name: Valerian Jewel   MRN: [id number]   DOB: Mar 04, 1956   Procedure: Right Heart Cath, Left Heart Cath, Selective Coronary Angiography, Aortic root angiography   Indication: This is a 58 year old gentleman with severe symptomatic aortic valve stenosis. He presents for right and left heart catheterization as part of a  preoperative evaluation for anticipated cardiac surgery.   Procedural Details: There was  an indwelling IV in a right antecubital vein. Using normal sterile technique, the IV was changed out for a 5 Fr brachial sheath over a 0.018 inch wire. The right wrist was then prepped, draped, and anesthetized with 1% lidocaine. Using the modified Seldinger technique a 5/6 French Slender sheath was placed in the right radial artery. Intra-arterial verapamil was administered through the radial artery sheath. IV heparin was administered after a JR4 catheter was advanced into the central aorta. A Swan-Ganz catheter was used for the right heart catheterization. Standard protocol was followed for recording of right heart pressures and sampling of oxygen saturations. Fick cardiac output was calculated. Standard Judkins catheters were used for selective coronary angiography. A pigtail catheter was used to perform aortic root angiography. There were no immediate procedural complications. The patient was transferred to the post catheterization recovery area for further monitoring.   Procedural Findings:   Hemodynamics   RA 10   RV 33/13   PA 33/12 mean 24   PCWP 17   LV not recorded   AO 111/81   Oxygen saturations:   PA 63   AO 90   Cardiac Output (Fick) 6.3   Cardiac Index (Fick) 2.7   Coronary angiography:   Coronary dominance: right   Left mainstem: The left mainstem is widely patent its without significant stenosis. It divides into the LAD and left circumflex. The left main arises from the left coronary cusp.   Left anterior descending (LAD): The LAD has mild proximal irregularity with 20-30% stenosis. The mid vessel has diffuse plaquing without any significant obstruction. The diagonal branches are patent.   Left circumflex (LCx): The left circumflex is large in caliber. There is a large first obtuse marginal branch no significant stenosis. The AV circumflex extends down the AV groove and supplies a left  posterolateral branch without significant stenosis.   Right coronary artery (RCA): The RCA is a dominant vessel. There are minor irregularities noted but no significant stenosis throughout the proximal, mid, or distal vessel. The PDA and PLA branches are patent.   Aortic root angiography: There is very severe calcification of the aortic valve with extremely restricted mobility. The aortic root and proximal ascending aorta are dilated.   Contrast: 95 cc Omnipaque   Radiation dose/Fluoro time: 7.6 minutes   Estimated Blood Loss: minimal   Final Conclusions:   1. Severely calcified aortic valve with known severe AS   2. Patent coronary arteries with minor diffuse nonobstructive disease   3. Normal right heart hemodynamics   4. Dilated aortic root   Recommendations: Cardiac surgery referral. Ascending aorta does not appear to be severely dilated, but will order CTA of the chest to evaluate aortic dimensions in this patient with a known bicuspid Ao valve.   [name] MD, [facility]   11/08/2013, 8:23 AM      Cardiac-Gated CTA Heart  TECHNIQUE:  The patient was scanned on a Philips 256 scanner. A 120 kV   retrospective scan was triggered in the descending thoracic aorta at   111 HU's. Gantry rotation speed was 270 msecs and collimation was .9   mm. No beta blockade or nitro were given. The 3D data set was   reconstructed in 5% intervals of the R-R cycle. Systolic and   diastolic phases were analyzed on a dedicated work station using   MPR, MIP and VRT modes. The patient received 80 cc of contrast.   FINDINGS:   Aortic Valve: Trileaflet aortic valve with partially fused right and  non-coronary leaflets - functionally bileaflet valve. Severely   calcified leaflets with severely restricted leaflet opening.   Aorta: Aortic root, sinotubular junction and ascending thoracic arch   are mildly dilated. There is no dissection, no calcifications.   Sinotubular Junction: 34 x 34 mm   Ascending Thoracic  Aorta: 40 x 40 mm   Aortic Arch: 33 x 32 mm   Descending Thoracic Aorta: 33 x 32 mm   Sinus of Valsalva Measurements:   Non-coronary: 39 mm   Right -coronary: 37 mm   Left -coronary: 39 mm   Coronary Artery Height above Annulus:   Left Main: 14 mm   Right Coronary: 20 mm   Virtual Basal Annulus Measurements:   Maximum/Minimum Diameter: 34 x 28 mm   Perimeter: 106 mm   Area: 752 mm2   Coronary Arteries: Originating in normal position. Right dominance.   No significant plaque.   IMPRESSION:   1. Functionally bicuspid aortic valve with severely calcified   leaflets and severely limited leaflet opening. Aortic annular   measurements exceed currently available Edward-Sapien XT TAVR.   2. Aortic root, sinotubular junction and ascending thoracic arch are   mildly dilated.   Ena Dawley   Electronically Signed   By: Ena Dawley   On: 11/14/2013 19:52             Study Result        EXAM:    OVER-READ INTERPRETATION CT CHEST   The following report is an over-read performed by radiologist Dr.   Alvino Blood [facility] on 11/14/2013. This   over-read does not include interpretation of cardiac or coronary   anatomy or pathology. The coronary calcium score/coronary CTA   interpretation by the cardiologist is attached.   COMPARISON: None.   FINDINGS:   Review of the lung parenchyma demonstrates no nodularity. The   entirety of the lung parenchyma is not imaged. No mediastinal   lymphadenopathy. Esophagus is normal. No aggressive osseous lesion.   IMPRESSION:   No significant extra thoracic findings.   Electronically Signed:   By: Suzy Bouchard M.D.   On: 11/14/2013 15:55       Impression:  Patient has stage D severe symptomatic aortic stenosis with a bicuspid native aortic valve.  Left ventricular systolic function is preserved.  Cardiac catheterization is notable for the absence of significant coronary artery disease.  Cardiac gated CT angiogram  confirms only mild dilatation of the proximal aorta with maximum transverse diameter approximately 3.9 - 4.0 cm.  I agree that the patient would best be treated with elective aortic valve replacement. The patient also appears to have a small patent foramen ovale.    Plan:  The patient and his wife were counseled at length regarding surgical alternatives with respect to aortic valve replacement including continued medical therapy versus proceeding with conventional surgical aortic valve replacement using either a mechanical prosthesis or a bioprosthetic tissue valve.  Other alternatives including the Ross autograft procedure, homograft aortic root replacement, stentless bioprosthetic tissue valve replacement, valve repair, and transcatheter aortic valve replacement were discussed.  Discussion was held comparing the relative risks of mechanical valve replacement with need for lifelong anticoagulation versus use of a bioprosthetic tissue valve and the associated potential for late structural valve deterioration in failure.  This discussion was placed in the context of the patient's particular circumstances, and as a result the patient specifically requests that their valve be replaced using a bioprosthetic tissue valve.  Alternative surgical approaches have been discussed  including a comparison between conventional sternotomy and minimally-invasive techniques.  The relative risks and benefits of each have been reviewed as they pertain to the patient's specific circumstances, and all of their questions have been addressed.  The patient understands and accepts all potential associated risks of surgery including but not limited to risk of death, stroke, myocardial infarction, congestive heart failure, respiratory failure, renal failure, pneumonia, bleeding requiring blood transfusion and or reexploration, arrhythmia, heart block or bradycardia requiring permanent pacemaker, aortic dissection or other major vascular  complication, pleural effusions or other delayed complications related to continued congestive heart failure, and other late complications related to valve replacement including structural valve deterioration and failure, thrombosis, endocarditis, or paravalvular leak.  We tentatively plan to proceed with aortic valve replacement using a bioprosthetic tissue valve via conventional median sternotomy on Wednesday, 12/05/2013.  We will confirm the presence of patent foramen ovale using transesophageal echocardiogram, and if present we will plan to close it. All of his questions been addressed.    Valentina Gu. Roxy Manns, MD 12/03/2013 10:30 AM

## 2013-12-03 NOTE — Progress Notes (Signed)
      [address]       [place] [address]             [place] OFFICE NOTE  Referring Provider is [name], MD PCP is Aundria Mems, MD   HPI:   Patient returns for followup of severe symptomatic aortic stenosis with tentative plans to proceed with elective aortic valve replacement later this week. He was originally seen in consultation 11/16/2013. Since then he reports no new problems or complaints, although he did develop a rash related to poison ivy on his right forearm and right leg. This has almost completely resolved with conservative measures.    Current Outpatient Prescriptions  Medication Sig Dispense Refill  . lisinopril-hydrochlorothiazide (PRINZIDE,ZESTORETIC) 20-25 MG per tablet Take 1 tablet by mouth daily.  90 tablet  3  . Multiple Vitamin (MULTIVITAMIN WITH MINERALS) TABS tablet Take 1 tablet by mouth daily.      Marland Kitchen aspirin EC 81 MG tablet Take 1 tablet (81 mg total) by mouth daily.  90 tablet  3  . Omega-3 Fatty Acids (FISH OIL) 1000 MG CAPS Take 3,000 mg by mouth daily.        No current facility-administered medications for this visit.      Physical Exam:   BP 124/81  Pulse 70  Ht 5\' 11"  (1.803 m)  Wt 254 lb (115.214 kg)  BMI 35.44 kg/m2  SpO2 98%  General:  Well-appearing  Chest:   clear  CV:   Regular rate and rhythm without murmur  Incisions:  n/a  Abdomen:  Soft nontender  Extremities:  Warm and well-perfused. The patient has very mild rash along the volar aspect of the right forearm in the right lower leg. There is no surrounding cellulitis. There no areas of skin breakdown.  Diagnostic Tests:  n/a   Impression:  Patient has stage D severe symptomatic aortic stenosis with a bicuspid native aortic valve. Left ventricular systolic function is preserved. Cardiac catheterization is notable for the absence of significant coronary artery disease. Cardiac gated CT angiogram confirms  only mild dilatation of the proximal aorta with maximum transverse diameter approximately 3.9 - 4.0 cm. I agree that the patient would best be treated with elective aortic valve replacement.  The patient also appears to have a small patent foramen ovale.    Plan:  The patient was again counseled regarding the indications, risks, and potential benefits of surgery. Alternative treatment strategies been discussed. Alternative surgical approaches have been discussed. We plan to proceed with aortic valve replacement using a bioprosthetic tissue valve via a conventional median sternotomy on Wednesday, 12/05/2013. We will confirm the presence of patent foramen ovale using transesophageal echocardiogram, and if present we will plan to close it. All of his questions been addressed.  I spent in excess of 15 minutes during the conduct of this office consultation and >50% of this time involved direct face-to-face encounter with the patient for counseling and/or coordination of their care.   Valentina Gu. Roxy Manns, MD 12/03/2013 10:30 AM

## 2013-12-03 NOTE — Progress Notes (Signed)
VASCULAR LAB PRELIMINARY  PRELIMINARY  PRELIMINARY  PRELIMINARY  Pre-op Cardiac Surgery  Carotid Findings:  Bilateral:  1-39% ICA stenosis.  Vertebral artery flow is antegrade.      Upper Extremity Right Left  Brachial Pressures 107  Triphasic  115  Triphasic   Radial Waveforms Triphasic  Triphasic   Ulnar Waveforms Triphasic  Triphasic   Palmar Arch (Allen's Test) Within normal limits  Within normal limits       Griselda Tosh, RVT 12/03/2013, 2:39 PM

## 2013-12-04 ENCOUNTER — Encounter (HOSPITAL_COMMUNITY): Payer: Self-pay | Admitting: Certified Registered Nurse Anesthetist

## 2013-12-04 LAB — HEMOGLOBIN A1C
Hgb A1c MFr Bld: 5.4 % (ref <5.7)
MEAN PLASMA GLUCOSE: 108 mg/dL (ref <117)

## 2013-12-04 MED ORDER — PHENYLEPHRINE HCL 10 MG/ML IJ SOLN
30.0000 ug/min | INTRAVENOUS | Status: AC
  Administered 2013-12-05: 40 ug/min via INTRAVENOUS
  Filled 2013-12-04: qty 2

## 2013-12-04 MED ORDER — VANCOMYCIN HCL 1000 MG IV SOLR
INTRAVENOUS | Status: AC
  Administered 2013-12-05: 11:00:00
  Filled 2013-12-04: qty 1000

## 2013-12-04 MED ORDER — DOPAMINE-DEXTROSE 3.2-5 MG/ML-% IV SOLN
2.0000 ug/kg/min | INTRAVENOUS | Status: DC
  Filled 2013-12-04: qty 250

## 2013-12-04 MED ORDER — PAPAVERINE HCL 30 MG/ML IJ SOLN
INTRAMUSCULAR | Status: DC
  Filled 2013-12-04: qty 2.5

## 2013-12-04 MED ORDER — POTASSIUM CHLORIDE 2 MEQ/ML IV SOLN
80.0000 meq | INTRAVENOUS | Status: DC
  Filled 2013-12-04: qty 40

## 2013-12-04 MED ORDER — MAGNESIUM SULFATE 50 % IJ SOLN
40.0000 meq | INTRAMUSCULAR | Status: DC
  Filled 2013-12-04: qty 10

## 2013-12-04 MED ORDER — DEXTROSE 5 % IV SOLN
750.0000 mg | INTRAVENOUS | Status: DC
  Filled 2013-12-04: qty 750

## 2013-12-04 MED ORDER — DEXTROSE 5 % IV SOLN
1.5000 g | INTRAVENOUS | Status: AC
  Administered 2013-12-05: 1.5 g via INTRAVENOUS
  Administered 2013-12-05: .75 g via INTRAVENOUS
  Filled 2013-12-04: qty 1.5

## 2013-12-04 MED ORDER — SODIUM CHLORIDE 0.9 % IV SOLN
INTRAVENOUS | Status: AC
  Administered 2013-12-05: 14 mL/h via INTRAVENOUS
  Filled 2013-12-04: qty 40

## 2013-12-04 MED ORDER — NITROGLYCERIN IN D5W 200-5 MCG/ML-% IV SOLN
2.0000 ug/min | INTRAVENOUS | Status: DC
  Filled 2013-12-04: qty 250

## 2013-12-04 MED ORDER — EPINEPHRINE HCL 1 MG/ML IJ SOLN
0.5000 ug/min | INTRAVENOUS | Status: DC
  Filled 2013-12-04: qty 4

## 2013-12-04 MED ORDER — METOPROLOL TARTRATE 12.5 MG HALF TABLET
12.5000 mg | ORAL_TABLET | Freq: Once | ORAL | Status: AC
  Administered 2013-12-05: 12.5 mg via ORAL
  Filled 2013-12-04: qty 1

## 2013-12-04 MED ORDER — VANCOMYCIN HCL 10 G IV SOLR
1500.0000 mg | INTRAVENOUS | Status: AC
  Administered 2013-12-05: 1500 mg via INTRAVENOUS
  Filled 2013-12-04 (×2): qty 1500

## 2013-12-04 MED ORDER — DEXMEDETOMIDINE HCL IN NACL 400 MCG/100ML IV SOLN
0.1000 ug/kg/h | INTRAVENOUS | Status: AC
  Administered 2013-12-05: 0.2 ug/kg/h via INTRAVENOUS
  Filled 2013-12-04: qty 100

## 2013-12-04 MED ORDER — SODIUM CHLORIDE 0.9 % IV SOLN
INTRAVENOUS | Status: AC
  Administered 2013-12-05: 1.3 [IU]/h via INTRAVENOUS
  Filled 2013-12-04: qty 2.5

## 2013-12-04 MED ORDER — SODIUM CHLORIDE 0.9 % IV SOLN
INTRAVENOUS | Status: DC
  Filled 2013-12-04: qty 30

## 2013-12-04 NOTE — Progress Notes (Signed)
Anesthesia Chart Review: Patient is a 58 year old male scheduled for AVR by Dr. Roxy Manns on 12/05/13.   History includes bicuspid AV with severe AS, minor non-obstructive CAD by 10/2013 cath, HTN, non-smoker,  BMI is consistent with obesity.  PCP Dr. Dianah Field. is Cardiologist is Dr. Burt Knack.  TEE on 10/23/13 showed:  - Left ventricle: The cavity size was normal. Wall thickness was increased in a pattern of mild LVH. Systolic function was normal. The estimated ejection fraction was in the range of 60% to 65%. Wall motion was normal; there were no regional wall motion abnormalities. - Aortic valve: Heavily calcified aortic valve with fused left and noncoronary cusps (bicuspid). Severe aortic stenosis with mean gradient 68 mmHg and AVA about 0.9 cm^2 (has large LVOT diameter). Mild aortic insufficiency. Valve area (Vmean): 0.79 cm^2. - Aorta: The aortic root and ascending aorta are dilated to about 4.0 cm. - Mitral valve: There was trivial regurgitation. - Left atrium: No evidence of thrombus in the atrial cavity or appendage. - Right ventricle: The cavity size was normal. Systolic function was normal. - Right atrium: No evidence of thrombus in the atrial cavity or appendage. - Atrial septum: There was a small PFO seen by color only (Eustachian valve prevented bubbles from getting to it).  Cardiac cath on 11/08/13 showed: 1. Severely calcified aortic valve with known severe AS  2. Patent coronary arteries with minor diffuse nonobstructive disease (20-30% proximal LAD, minor RCA irregularities) 3. Normal right heart hemodynamics  4. Dilated aortic root  Coronary/Chest CTA 11/14/13 showed: 1. Functionally bicuspid aortic valve with severely calcified  leaflets and severely limited leaflet opening. Aortic annular  measurements exceed currently available Edward-Sapien XT TAVR.  2. Aortic root, sinotubular junction and ascending thoracic arch are mildly dilated. (Sinotubular Junction: 34 x 34 mm, Ascending  Thoracic Aorta: 40 x 40 mm, Aortic Arch: 33 x 32 mm, Descending Thoracic Aorta: 33 x 32 mm)  Preliminary carotid duplex on 12/03/13 showed: 1-39% bilateral ICA stenosis, antegrade vertebral artery flow.  Preoperative EKG, CXR, PFTs, and labs noted.  PTT 41.  Labs marked as reviewed by Dr. Roxy Manns. Defer additional lab orders, if any, to Dr. Roxy Manns.  [name] [facility] Short Stay Center/Anesthesiology Phone [phone] 12/04/2013 9:24 AM

## 2013-12-05 ENCOUNTER — Inpatient Hospital Stay (HOSPITAL_COMMUNITY): Payer: 59

## 2013-12-05 ENCOUNTER — Encounter (HOSPITAL_COMMUNITY)
Admission: RE | Disposition: A | Discharge status: Home / Self Care | Payer: 59 | Source: Ambulatory Visit | Attending: Thoracic Surgery (Cardiothoracic Vascular Surgery)

## 2013-12-05 ENCOUNTER — Encounter (HOSPITAL_COMMUNITY): Payer: Self-pay | Admitting: Thoracic Surgery (Cardiothoracic Vascular Surgery)

## 2013-12-05 ENCOUNTER — Inpatient Hospital Stay (HOSPITAL_COMMUNITY)
Admission: RE | Admit: 2013-12-05 | Discharge: 2013-12-09 | DRG: 220 | Disposition: A | Discharge status: Home / Self Care | Payer: 59 | Source: Ambulatory Visit | Attending: Thoracic Surgery (Cardiothoracic Vascular Surgery) | Admitting: Thoracic Surgery (Cardiothoracic Vascular Surgery)

## 2013-12-05 ENCOUNTER — Encounter (HOSPITAL_COMMUNITY): Payer: 59 | Admitting: Anesthesiology

## 2013-12-05 ENCOUNTER — Inpatient Hospital Stay (HOSPITAL_COMMUNITY): Payer: 59 | Admitting: Anesthesiology

## 2013-12-05 DIAGNOSIS — Q2111 Secundum atrial septal defect: Secondary | ICD-10-CM

## 2013-12-05 DIAGNOSIS — Z823 Family history of stroke: Secondary | ICD-10-CM | POA: Diagnosis not present

## 2013-12-05 DIAGNOSIS — D62 Acute posthemorrhagic anemia: Secondary | ICD-10-CM | POA: Diagnosis not present

## 2013-12-05 DIAGNOSIS — K5900 Constipation, unspecified: Secondary | ICD-10-CM | POA: Diagnosis not present

## 2013-12-05 DIAGNOSIS — Z8249 Family history of ischemic heart disease and other diseases of the circulatory system: Secondary | ICD-10-CM | POA: Diagnosis not present

## 2013-12-05 DIAGNOSIS — J9819 Other pulmonary collapse: Secondary | ICD-10-CM | POA: Diagnosis present

## 2013-12-05 DIAGNOSIS — Z833 Family history of diabetes mellitus: Secondary | ICD-10-CM | POA: Diagnosis not present

## 2013-12-05 DIAGNOSIS — I359 Nonrheumatic aortic valve disorder, unspecified: Principal | ICD-10-CM | POA: Diagnosis present

## 2013-12-05 DIAGNOSIS — Q2112 Patent foramen ovale: Secondary | ICD-10-CM

## 2013-12-05 DIAGNOSIS — Z806 Family history of leukemia: Secondary | ICD-10-CM | POA: Diagnosis not present

## 2013-12-05 DIAGNOSIS — R112 Nausea with vomiting, unspecified: Secondary | ICD-10-CM | POA: Diagnosis not present

## 2013-12-05 DIAGNOSIS — I350 Nonrheumatic aortic (valve) stenosis: Secondary | ICD-10-CM

## 2013-12-05 DIAGNOSIS — E8779 Other fluid overload: Secondary | ICD-10-CM | POA: Diagnosis present

## 2013-12-05 DIAGNOSIS — Q231 Congenital insufficiency of aortic valve: Secondary | ICD-10-CM | POA: Diagnosis not present

## 2013-12-05 DIAGNOSIS — I10 Essential (primary) hypertension: Secondary | ICD-10-CM | POA: Diagnosis present

## 2013-12-05 DIAGNOSIS — Q211 Atrial septal defect: Secondary | ICD-10-CM | POA: Diagnosis not present

## 2013-12-05 DIAGNOSIS — Z953 Presence of xenogenic heart valve: Secondary | ICD-10-CM

## 2013-12-05 DIAGNOSIS — Z9852 Vasectomy status: Secondary | ICD-10-CM

## 2013-12-05 DIAGNOSIS — Z800 Family history of malignant neoplasm of digestive organs: Secondary | ICD-10-CM | POA: Diagnosis not present

## 2013-12-05 DIAGNOSIS — I2510 Atherosclerotic heart disease of native coronary artery without angina pectoris: Secondary | ICD-10-CM | POA: Diagnosis present

## 2013-12-05 HISTORY — DX: Presence of xenogenic heart valve: Z95.3

## 2013-12-05 HISTORY — PX: PATENT FORAMEN OVALE CLOSURE: SHX5483

## 2013-12-05 HISTORY — PX: AORTIC VALVE REPLACEMENT: SHX41

## 2013-12-05 HISTORY — PX: INTRAOPERATIVE TRANSESOPHAGEAL ECHOCARDIOGRAM: SHX5062

## 2013-12-05 HISTORY — DX: Atrial septal defect: Q21.1

## 2013-12-05 LAB — POCT I-STAT 3, ART BLOOD GAS (G3+)
ACID-BASE DEFICIT: 6 mmol/L — AB (ref 0.0–2.0)
ACID-BASE EXCESS: 2 mmol/L (ref 0.0–2.0)
Acid-base deficit: 3 mmol/L — ABNORMAL HIGH (ref 0.0–2.0)
Acid-base deficit: 3 mmol/L — ABNORMAL HIGH (ref 0.0–2.0)
Bicarbonate: 27.1 mEq/L — ABNORMAL HIGH (ref 20.0–24.0)
Bicarbonate: 23.5 mEq/L (ref 20.0–24.0)
Bicarbonate: 20 mEq/L (ref 20.0–24.0)
Bicarbonate: 19.4 mEq/L — ABNORMAL LOW (ref 20.0–24.0)
O2 SAT: 100 %
O2 SAT: 94 %
O2 SAT: 91 %
O2 Saturation: 98 %
PH ART: 7.3 — AB (ref 7.350–7.450)
PO2 ART: 76 mmHg — AB (ref 80.0–100.0)
PO2 ART: 103 mmHg — AB (ref 80.0–100.0)
PO2 ART: 68 mmHg — AB (ref 80.0–100.0)
Patient temperature: 36.9
TCO2: 28 mmol/L (ref 0–100)
TCO2: 25 mmol/L (ref 0–100)
TCO2: 21 mmol/L (ref 0–100)
TCO2: 21 mmol/L (ref 0–100)
pCO2 arterial: 42 mmHg (ref 35.0–45.0)
pCO2 arterial: 47.7 mmHg — ABNORMAL HIGH (ref 35.0–45.0)
pCO2 arterial: 29.9 mmHg — ABNORMAL LOW (ref 35.0–45.0)
pCO2 arterial: 38.2 mmHg (ref 35.0–45.0)
pH, Arterial: 7.418 (ref 7.350–7.450)
pH, Arterial: 7.436 (ref 7.350–7.450)
pH, Arterial: 7.316 — ABNORMAL LOW (ref 7.350–7.450)
pO2, Arterial: 412 mmHg — ABNORMAL HIGH (ref 80.0–100.0)

## 2013-12-05 LAB — POCT I-STAT, CHEM 8
BUN: 21 mg/dL (ref 6–23)
BUN: 20 mg/dL (ref 6–23)
BUN: 19 mg/dL (ref 6–23)
BUN: 19 mg/dL (ref 6–23)
BUN: 18 mg/dL (ref 6–23)
BUN: 16 mg/dL (ref 6–23)
CALCIUM ION: 1.31 mmol/L — AB (ref 1.12–1.23)
CALCIUM ION: 1.1 mmol/L — AB (ref 1.12–1.23)
CALCIUM ION: 1.16 mmol/L (ref 1.12–1.23)
CALCIUM ION: 1.17 mmol/L (ref 1.12–1.23)
CHLORIDE: 115 meq/L — AB (ref 96–112)
CREATININE: 1 mg/dL (ref 0.50–1.35)
CREATININE: 1 mg/dL (ref 0.50–1.35)
CREATININE: 1 mg/dL (ref 0.50–1.35)
Calcium, Ion: 1.25 mmol/L — ABNORMAL HIGH (ref 1.12–1.23)
Calcium, Ion: 1.17 mmol/L (ref 1.12–1.23)
Chloride: 104 mEq/L (ref 96–112)
Chloride: 104 mEq/L (ref 96–112)
Chloride: 100 mEq/L (ref 96–112)
Chloride: 100 mEq/L (ref 96–112)
Chloride: 103 mEq/L (ref 96–112)
Creatinine, Ser: 0.9 mg/dL (ref 0.50–1.35)
Creatinine, Ser: 1 mg/dL (ref 0.50–1.35)
Creatinine, Ser: 1 mg/dL (ref 0.50–1.35)
GLUCOSE: 135 mg/dL — AB (ref 70–99)
Glucose, Bld: 104 mg/dL — ABNORMAL HIGH (ref 70–99)
Glucose, Bld: 112 mg/dL — ABNORMAL HIGH (ref 70–99)
Glucose, Bld: 102 mg/dL — ABNORMAL HIGH (ref 70–99)
Glucose, Bld: 144 mg/dL — ABNORMAL HIGH (ref 70–99)
Glucose, Bld: 139 mg/dL — ABNORMAL HIGH (ref 70–99)
HCT: 35 % — ABNORMAL LOW (ref 39.0–52.0)
HCT: 34 % — ABNORMAL LOW (ref 39.0–52.0)
HCT: 31 % — ABNORMAL LOW (ref 39.0–52.0)
HCT: 28 % — ABNORMAL LOW (ref 39.0–52.0)
HEMATOCRIT: 27 % — AB (ref 39.0–52.0)
HEMATOCRIT: 27 % — AB (ref 39.0–52.0)
HEMOGLOBIN: 11.6 g/dL — AB (ref 13.0–17.0)
HEMOGLOBIN: 9.2 g/dL — AB (ref 13.0–17.0)
HEMOGLOBIN: 9.5 g/dL — AB (ref 13.0–17.0)
Hemoglobin: 11.9 g/dL — ABNORMAL LOW (ref 13.0–17.0)
Hemoglobin: 9.2 g/dL — ABNORMAL LOW (ref 13.0–17.0)
Hemoglobin: 10.5 g/dL — ABNORMAL LOW (ref 13.0–17.0)
POTASSIUM: 4.4 meq/L (ref 3.7–5.3)
Potassium: 3.9 mEq/L (ref 3.7–5.3)
Potassium: 4.2 mEq/L (ref 3.7–5.3)
Potassium: 5.3 mEq/L (ref 3.7–5.3)
Potassium: 4.6 mEq/L (ref 3.7–5.3)
Potassium: 4.2 mEq/L (ref 3.7–5.3)
SODIUM: 135 meq/L — AB (ref 137–147)
Sodium: 138 mEq/L (ref 137–147)
Sodium: 136 mEq/L — ABNORMAL LOW (ref 137–147)
Sodium: 135 mEq/L — ABNORMAL LOW (ref 137–147)
Sodium: 134 mEq/L — ABNORMAL LOW (ref 137–147)
Sodium: 134 mEq/L — ABNORMAL LOW (ref 137–147)
TCO2: 25 mmol/L (ref 0–100)
TCO2: 25 mmol/L (ref 0–100)
TCO2: 26 mmol/L (ref 0–100)
TCO2: 23 mmol/L (ref 0–100)
TCO2: 22 mmol/L (ref 0–100)
TCO2: 22 mmol/L (ref 0–100)

## 2013-12-05 LAB — APTT: APTT: 38 s — AB (ref 24–37)

## 2013-12-05 LAB — CBC
HCT: 34.4 % — ABNORMAL LOW (ref 39.0–52.0)
HEMATOCRIT: 30.9 % — AB (ref 39.0–52.0)
Hemoglobin: 11.4 g/dL — ABNORMAL LOW (ref 13.0–17.0)
Hemoglobin: 10.4 g/dL — ABNORMAL LOW (ref 13.0–17.0)
MCH: 27.8 pg (ref 26.0–34.0)
MCH: 28.7 pg (ref 26.0–34.0)
MCHC: 33.1 g/dL (ref 30.0–36.0)
MCHC: 33.7 g/dL (ref 30.0–36.0)
MCV: 83.9 fL (ref 78.0–100.0)
MCV: 85.4 fL (ref 78.0–100.0)
PLATELETS: 166 10*3/uL (ref 150–400)
PLATELETS: 128 10*3/uL — AB (ref 150–400)
RBC: 4.1 MIL/uL — ABNORMAL LOW (ref 4.22–5.81)
RBC: 3.62 MIL/uL — AB (ref 4.22–5.81)
RDW: 13.2 % (ref 11.5–15.5)
RDW: 13.3 % (ref 11.5–15.5)
WBC: 19 10*3/uL — AB (ref 4.0–10.5)
WBC: 13.4 10*3/uL — ABNORMAL HIGH (ref 4.0–10.5)

## 2013-12-05 LAB — CREATININE, SERUM
Creatinine, Ser: 0.98 mg/dL (ref 0.50–1.35)
GFR calc non Af Amer: >90 mL/min (ref >90)

## 2013-12-05 LAB — HEMOGLOBIN AND HEMATOCRIT, BLOOD
HCT: 28.3 % — ABNORMAL LOW (ref 39.0–52.0)
Hemoglobin: 9.6 g/dL — ABNORMAL LOW (ref 13.0–17.0)

## 2013-12-05 LAB — MAGNESIUM: Magnesium: 3 mg/dL — ABNORMAL HIGH (ref 1.5–2.5)

## 2013-12-05 LAB — POCT I-STAT 4, (NA,K, GLUC, HGB,HCT)
Glucose, Bld: 113 mg/dL — ABNORMAL HIGH (ref 70–99)
HEMATOCRIT: 33 % — AB (ref 39.0–52.0)
Hemoglobin: 11.2 g/dL — ABNORMAL LOW (ref 13.0–17.0)
Potassium: 4.7 mEq/L (ref 3.7–5.3)
Sodium: 137 mEq/L (ref 137–147)

## 2013-12-05 LAB — PLATELET COUNT: Platelets: 206 10*3/uL (ref 150–400)

## 2013-12-05 LAB — PROTIME-INR
INR: 1.31 (ref 0.00–1.49)
Prothrombin Time: 16.3 seconds — ABNORMAL HIGH (ref 11.6–15.2)

## 2013-12-05 SURGERY — REPLACEMENT, AORTIC VALVE, OPEN
Anesthesia: General | Site: Chest

## 2013-12-05 MED ORDER — ROCURONIUM BROMIDE 50 MG/5ML IV SOLN
INTRAVENOUS | Status: AC
  Filled 2013-12-05: qty 1

## 2013-12-05 MED ORDER — MAGNESIUM SULFATE 4000MG/100ML IJ SOLN
4.0000 g | Freq: Once | INTRAMUSCULAR | Status: AC
  Administered 2013-12-05: 4 g via INTRAVENOUS
  Filled 2013-12-05: qty 100

## 2013-12-05 MED ORDER — DEXMEDETOMIDINE HCL IN NACL 200 MCG/50ML IV SOLN: 0.1000 ug/kg/h | INTRAVENOUS | Status: DC

## 2013-12-05 MED ORDER — METOPROLOL TARTRATE 12.5 MG HALF TABLET
12.5000 mg | ORAL_TABLET | Freq: Two times a day (BID) | ORAL | Status: DC
  Administered 2013-12-06 – 2013-12-08 (×6): 12.5 mg via ORAL
  Filled 2013-12-05 (×9): qty 1

## 2013-12-05 MED ORDER — VANCOMYCIN HCL IN DEXTROSE 1-5 GM/200ML-% IV SOLN
1000.0000 mg | Freq: Once | INTRAVENOUS | Status: AC
  Administered 2013-12-05: 1000 mg via INTRAVENOUS
  Filled 2013-12-05: qty 200

## 2013-12-05 MED ORDER — METOPROLOL TARTRATE 1 MG/ML IV SOLN: 2.5000 mg | INTRAVENOUS | Status: DC | PRN

## 2013-12-05 MED ORDER — ONDANSETRON HCL 4 MG/2ML IJ SOLN
4.0000 mg | Freq: Four times a day (QID) | INTRAMUSCULAR | Status: DC | PRN
  Administered 2013-12-05 – 2013-12-07 (×5): 4 mg via INTRAVENOUS
  Filled 2013-12-05 (×5): qty 2

## 2013-12-05 MED ORDER — LACTATED RINGERS IV SOLN
INTRAVENOUS | Status: DC | PRN
  Administered 2013-12-05: 08:00:00 via INTRAVENOUS

## 2013-12-05 MED ORDER — ASPIRIN 81 MG PO CHEW: 324.0000 mg | CHEWABLE_TABLET | Freq: Every day | ORAL | Status: DC

## 2013-12-05 MED ORDER — ACETAMINOPHEN 160 MG/5ML PO SOLN: 1000.0000 mg | Freq: Four times a day (QID) | ORAL | Status: DC

## 2013-12-05 MED ORDER — FENTANYL CITRATE 0.05 MG/ML IJ SOLN
INTRAMUSCULAR | Status: AC
  Filled 2013-12-05: qty 5

## 2013-12-05 MED ORDER — METOPROLOL TARTRATE 25 MG/10 ML ORAL SUSPENSION
12.5000 mg | Freq: Two times a day (BID) | ORAL | Status: DC
  Filled 2013-12-05 (×3): qty 5

## 2013-12-05 MED ORDER — LACTATED RINGERS IV SOLN
INTRAVENOUS | Status: DC
  Administered 2013-12-05: 15:00:00 via INTRAVENOUS

## 2013-12-05 MED ORDER — ACETAMINOPHEN 650 MG RE SUPP
650.0000 mg | Freq: Once | RECTAL | Status: AC
Start: 2013-12-05 — End: 2013-12-05
  Administered 2013-12-05: 650 mg via RECTAL

## 2013-12-05 MED ORDER — DOCUSATE SODIUM 100 MG PO CAPS
200.0000 mg | ORAL_CAPSULE | Freq: Every day | ORAL | Status: DC
  Administered 2013-12-06 – 2013-12-08 (×3): 200 mg via ORAL
  Filled 2013-12-05 (×4): qty 2

## 2013-12-05 MED ORDER — CHLORHEXIDINE GLUCONATE 0.12 % MT SOLN
15.0000 mL | Freq: Two times a day (BID) | OROMUCOSAL | Status: DC
  Administered 2013-12-05: 15 mL via OROMUCOSAL
  Filled 2013-12-05: qty 15

## 2013-12-05 MED ORDER — MIDAZOLAM HCL 5 MG/5ML IJ SOLN
INTRAMUSCULAR | Status: DC | PRN
  Administered 2013-12-05: 2 mg via INTRAVENOUS
  Administered 2013-12-05: 4 mg via INTRAVENOUS
  Administered 2013-12-05 (×2): 2 mg via INTRAVENOUS

## 2013-12-05 MED ORDER — SODIUM CHLORIDE 0.9 % IJ SOLN
3.0000 mL | Freq: Two times a day (BID) | INTRAMUSCULAR | Status: DC
  Administered 2013-12-06 – 2013-12-09 (×4): 3 mL via INTRAVENOUS

## 2013-12-05 MED ORDER — CETYLPYRIDINIUM CHLORIDE 0.05 % MT LIQD: 7.0000 mL | Freq: Four times a day (QID) | OROMUCOSAL | Status: DC

## 2013-12-05 MED ORDER — ASPIRIN EC 325 MG PO TBEC
325.0000 mg | DELAYED_RELEASE_TABLET | Freq: Every day | ORAL | Status: DC
  Filled 2013-12-05: qty 1

## 2013-12-05 MED ORDER — SODIUM CHLORIDE 0.9 % IJ SOLN
OROMUCOSAL | Status: DC | PRN
  Administered 2013-12-05 (×5): via TOPICAL

## 2013-12-05 MED ORDER — SODIUM CHLORIDE 0.9 % IV SOLN
INTRAVENOUS | Status: DC
  Administered 2013-12-06: 2.4 [IU]/h via INTRAVENOUS
  Filled 2013-12-05 (×2): qty 2.5

## 2013-12-05 MED ORDER — BISACODYL 10 MG RE SUPP: 10.0000 mg | Freq: Every day | RECTAL | Status: DC

## 2013-12-05 MED ORDER — SODIUM CHLORIDE 0.9 % IV SOLN: 250.0000 mL | INTRAVENOUS | Status: DC

## 2013-12-05 MED ORDER — FENTANYL CITRATE 0.05 MG/ML IJ SOLN
INTRAMUSCULAR | Status: AC
Start: 2013-12-05 — End: 2013-12-05
  Filled 2013-12-05: qty 5

## 2013-12-05 MED ORDER — MORPHINE SULFATE 2 MG/ML IJ SOLN
1.0000 mg | INTRAMUSCULAR | Status: AC | PRN
  Administered 2013-12-05: 2 mg via INTRAVENOUS
  Filled 2013-12-05: qty 1

## 2013-12-05 MED ORDER — SODIUM CHLORIDE 0.9 % IR SOLN
Status: DC | PRN
  Administered 2013-12-05: 6000 mL

## 2013-12-05 MED ORDER — PROPOFOL 10 MG/ML IV BOLUS
INTRAVENOUS | Status: DC | PRN
  Administered 2013-12-05: 30 mg via INTRAVENOUS

## 2013-12-05 MED ORDER — SODIUM CHLORIDE 0.9 % IJ SOLN: 3.0000 mL | INTRAMUSCULAR | Status: DC | PRN

## 2013-12-05 MED ORDER — ROCURONIUM BROMIDE 100 MG/10ML IV SOLN
INTRAVENOUS | Status: DC | PRN
  Administered 2013-12-05 (×4): 50 mg via INTRAVENOUS

## 2013-12-05 MED ORDER — CHLORHEXIDINE GLUCONATE 4 % EX LIQD
30.0000 mL | CUTANEOUS | Status: DC
  Filled 2013-12-05: qty 30

## 2013-12-05 MED ORDER — SODIUM CHLORIDE 0.9 % IJ SOLN
INTRAMUSCULAR | Status: AC
  Filled 2013-12-05: qty 10

## 2013-12-05 MED ORDER — ACETAMINOPHEN 160 MG/5ML PO SOLN: 650.0000 mg | Freq: Once | ORAL | Status: AC

## 2013-12-05 MED ORDER — ALBUMIN HUMAN 5 % IV SOLN
INTRAVENOUS | Status: DC | PRN
  Administered 2013-12-05 (×2): via INTRAVENOUS

## 2013-12-05 MED ORDER — FAMOTIDINE IN NACL 20-0.9 MG/50ML-% IV SOLN: 20.0000 mg | Freq: Two times a day (BID) | INTRAVENOUS | Status: DC

## 2013-12-05 MED ORDER — ESMOLOL HCL 10 MG/ML IV SOLN
INTRAVENOUS | Status: AC
Start: 2013-12-05 — End: 2013-12-05
  Filled 2013-12-05: qty 10

## 2013-12-05 MED ORDER — ARTIFICIAL TEARS OP OINT
TOPICAL_OINTMENT | OPHTHALMIC | Status: DC | PRN
  Administered 2013-12-05: 1 via OPHTHALMIC

## 2013-12-05 MED ORDER — DEXTROSE 5 % IV SOLN
1.5000 g | Freq: Two times a day (BID) | INTRAVENOUS | Status: AC
  Administered 2013-12-05 – 2013-12-07 (×4): 1.5 g via INTRAVENOUS
  Filled 2013-12-05 (×5): qty 1.5

## 2013-12-05 MED ORDER — LACTATED RINGERS IV SOLN
500.0000 mL | Freq: Once | INTRAVENOUS | Status: AC | PRN
Start: 2013-12-05 — End: 2013-12-05

## 2013-12-05 MED ORDER — MIDAZOLAM HCL 2 MG/2ML IJ SOLN: 2.0000 mg | INTRAMUSCULAR | Status: DC | PRN

## 2013-12-05 MED ORDER — ALBUMIN HUMAN 5 % IV SOLN
250.0000 mL | INTRAVENOUS | Status: DC | PRN
  Administered 2013-12-05 (×3): 250 mL via INTRAVENOUS
  Filled 2013-12-05: qty 250

## 2013-12-05 MED ORDER — PROTAMINE SULFATE 10 MG/ML IV SOLN
INTRAVENOUS | Status: DC | PRN
  Administered 2013-12-05: 310 mg via INTRAVENOUS

## 2013-12-05 MED ORDER — MORPHINE SULFATE 2 MG/ML IJ SOLN
2.0000 mg | INTRAMUSCULAR | Status: DC | PRN
Start: 2013-12-06 — End: 2013-12-06
  Administered 2013-12-05: 2 mg via INTRAVENOUS
  Administered 2013-12-05: 4 mg via INTRAVENOUS
  Administered 2013-12-05: 2 mg via INTRAVENOUS
  Administered 2013-12-06 (×6): 4 mg via INTRAVENOUS
  Filled 2013-12-05 (×3): qty 2
  Filled 2013-12-05: qty 1
  Filled 2013-12-05 (×3): qty 2
  Filled 2013-12-05: qty 1
  Filled 2013-12-05: qty 2

## 2013-12-05 MED ORDER — CETYLPYRIDINIUM CHLORIDE 0.05 % MT LIQD
7.0000 mL | Freq: Two times a day (BID) | OROMUCOSAL | Status: DC
  Administered 2013-12-05: 7 mL via OROMUCOSAL

## 2013-12-05 MED ORDER — SODIUM CHLORIDE 0.9 % IV SOLN
INTRAVENOUS | Status: AC
  Administered 2013-12-05: 15:00:00 via INTRAVENOUS

## 2013-12-05 MED ORDER — HEPARIN SODIUM (PORCINE) 1000 UNIT/ML IJ SOLN
INTRAMUSCULAR | Status: AC
  Filled 2013-12-05: qty 1

## 2013-12-05 MED ORDER — INSULIN REGULAR BOLUS VIA INFUSION
0.0000 [IU] | Freq: Three times a day (TID) | INTRAVENOUS | Status: DC
  Administered 2013-12-06: 4 [IU] via INTRAVENOUS
  Filled 2013-12-05: qty 10

## 2013-12-05 MED ORDER — NITROGLYCERIN IN D5W 200-5 MCG/ML-% IV SOLN
0.0000 ug/min | INTRAVENOUS | Status: DC
Start: 2013-12-05 — End: 2013-12-06

## 2013-12-05 MED ORDER — FENTANYL CITRATE 0.05 MG/ML IJ SOLN
INTRAMUSCULAR | Status: DC | PRN
  Administered 2013-12-05 (×2): 250 ug via INTRAVENOUS
  Administered 2013-12-05: 500 ug via INTRAVENOUS
  Administered 2013-12-05: 250 ug via INTRAVENOUS
  Administered 2013-12-05: 150 ug via INTRAVENOUS
  Administered 2013-12-05: 100 ug via INTRAVENOUS

## 2013-12-05 MED ORDER — OXYCODONE HCL 5 MG PO TABS
5.0000 mg | ORAL_TABLET | ORAL | Status: DC | PRN
  Administered 2013-12-05 – 2013-12-06 (×4): 10 mg via ORAL
  Filled 2013-12-05 (×2): qty 1
  Filled 2013-12-05 (×3): qty 2

## 2013-12-05 MED ORDER — PROPOFOL 10 MG/ML IV BOLUS
INTRAVENOUS | Status: AC
  Filled 2013-12-05: qty 20

## 2013-12-05 MED ORDER — LACTATED RINGERS IV SOLN
INTRAVENOUS | Status: DC | PRN
  Administered 2013-12-05 (×2): via INTRAVENOUS

## 2013-12-05 MED ORDER — AMINOCAPROIC ACID 250 MG/ML IV SOLN
INTRAVENOUS | Status: DC | PRN
  Administered 2013-12-05: 5 g via INTRAVENOUS

## 2013-12-05 MED ORDER — BISACODYL 5 MG PO TBEC
10.0000 mg | DELAYED_RELEASE_TABLET | Freq: Every day | ORAL | Status: DC
  Administered 2013-12-06 – 2013-12-07 (×2): 10 mg via ORAL
  Filled 2013-12-05 (×2): qty 2

## 2013-12-05 MED ORDER — HEPARIN SODIUM (PORCINE) 1000 UNIT/ML IJ SOLN
INTRAMUSCULAR | Status: DC | PRN
  Administered 2013-12-05: 39000 [IU] via INTRAVENOUS

## 2013-12-05 MED ORDER — MIDAZOLAM HCL 2 MG/2ML IJ SOLN
INTRAMUSCULAR | Status: AC
  Filled 2013-12-05: qty 2

## 2013-12-05 MED ORDER — SODIUM CHLORIDE 0.45 % IV SOLN
INTRAVENOUS | Status: DC
  Administered 2013-12-05: 14:00:00 via INTRAVENOUS

## 2013-12-05 MED ORDER — VECURONIUM BROMIDE 10 MG IV SOLR
INTRAVENOUS | Status: DC | PRN
  Administered 2013-12-05 (×2): 5 mg via INTRAVENOUS

## 2013-12-05 MED ORDER — PHENYLEPHRINE HCL 10 MG/ML IJ SOLN
0.0000 ug/min | INTRAVENOUS | Status: DC
  Filled 2013-12-05: qty 2

## 2013-12-05 MED ORDER — MIDAZOLAM HCL 10 MG/2ML IJ SOLN
INTRAMUSCULAR | Status: AC
  Filled 2013-12-05: qty 2

## 2013-12-05 MED ORDER — SODIUM CHLORIDE 0.9 % IV SOLN
INTRAVENOUS | Status: DC
  Administered 2013-12-05: 16:00:00 via INTRAVENOUS

## 2013-12-05 MED ORDER — ACETAMINOPHEN 500 MG PO TABS
1000.0000 mg | ORAL_TABLET | Freq: Four times a day (QID) | ORAL | Status: DC
  Administered 2013-12-05 – 2013-12-09 (×12): 1000 mg via ORAL
  Filled 2013-12-05 (×18): qty 2

## 2013-12-05 MED ORDER — LIDOCAINE HCL (CARDIAC) 20 MG/ML IV SOLN
INTRAVENOUS | Status: DC | PRN
  Administered 2013-12-05: 80 mg via INTRAVENOUS

## 2013-12-05 MED ORDER — PANTOPRAZOLE SODIUM 40 MG PO TBEC
40.0000 mg | DELAYED_RELEASE_TABLET | Freq: Every day | ORAL | Status: DC
  Administered 2013-12-08: 40 mg via ORAL
  Filled 2013-12-05 (×3): qty 1

## 2013-12-05 MED ORDER — POTASSIUM CHLORIDE 10 MEQ/50ML IV SOLN: 10.0000 meq | INTRAVENOUS | Status: AC

## 2013-12-05 MED FILL — Sodium Bicarbonate IV Soln 8.4%: INTRAVENOUS | Qty: 50 | Status: AC

## 2013-12-05 MED FILL — Sodium Chloride IV Soln 0.9%: INTRAVENOUS | Qty: 3000 | Status: AC

## 2013-12-05 MED FILL — Electrolyte-R (PH 7.4) Solution: INTRAVENOUS | Qty: 4000 | Status: AC

## 2013-12-05 MED FILL — Heparin Sodium (Porcine) Inj 1000 Unit/ML: INTRAMUSCULAR | Qty: 10 | Status: AC

## 2013-12-05 MED FILL — Lidocaine HCl IV Inj 20 MG/ML: INTRAVENOUS | Qty: 5 | Status: AC

## 2013-12-05 MED FILL — Mannitol IV Soln 20%: INTRAVENOUS | Qty: 500 | Status: AC

## 2013-12-05 SURGICAL SUPPLY — 98 items
ADAPTER CARDIO PERF ANTE/RETRO (ADAPTER) ×3 IMPLANT
ATTRACTOMAT 16X20 MAGNETIC DRP (DRAPES) ×3 IMPLANT
BLADE STERNUM SYSTEM 6 (BLADE) ×3 IMPLANT
BLADE SURG 11 STRL SS (BLADE) ×3 IMPLANT
CANISTER SUCTION 2500CC (MISCELLANEOUS) ×3 IMPLANT
CANNULA EZ GLIDE AORTIC 21FR (CANNULA) ×3 IMPLANT
CANNULA GUNDRY RCSP 15FR (MISCELLANEOUS) ×6 IMPLANT
CANNULA SOFTFLOW AORTIC 7M21FR (CANNULA) ×3 IMPLANT
CANNULA VENNOUS METAL TIP 20FR (CANNULA) ×3 IMPLANT
CANNULA VENOUS LOW PROF 34X46 (CANNULA) ×3 IMPLANT
CANNULA VRC MALB SNGL STG 28FR (MISCELLANEOUS) ×2 IMPLANT
CATH CPB KIT OWEN (MISCELLANEOUS) ×3 IMPLANT
CATH HEART VENT LEFT (CATHETERS) ×2 IMPLANT
CATH THORACIC 36FR (CATHETERS) ×3 IMPLANT
CATH THORACIC 36FR RT ANG (CATHETERS) IMPLANT
CONN 1/2X1/2X1/2  BEN (MISCELLANEOUS) ×1
CONN 1/2X1/2X1/2 BEN (MISCELLANEOUS) ×2 IMPLANT
CONN 3/8X1/2 ST GISH (MISCELLANEOUS) ×6 IMPLANT
CONT SPEC 4OZ CLIKSEAL STRL BL (MISCELLANEOUS) ×3 IMPLANT
COVER SURGICAL LIGHT HANDLE (MISCELLANEOUS) ×3 IMPLANT
CRADLE DONUT ADULT HEAD (MISCELLANEOUS) ×3 IMPLANT
DEVICE SUT CK QUICK LOAD INDV (Prosthesis & Implant Heart) ×9 IMPLANT
DEVICE SUT CK QUICK LOAD MINI (Prosthesis & Implant Heart) ×3 IMPLANT
DRAIN CHANNEL 32F RND 10.7 FF (WOUND CARE) ×3 IMPLANT
DRAPE BILATERAL SPLIT (DRAPES) IMPLANT
DRAPE CARDIOVASCULAR INCISE (DRAPES)
DRAPE CV SPLIT W-CLR ANES SCRN (DRAPES) IMPLANT
DRAPE INCISE IOBAN 66X45 STRL (DRAPES) ×6 IMPLANT
DRAPE SLUSH/WARMER DISC (DRAPES) ×3 IMPLANT
DRAPE SRG 135X102X78XABS (DRAPES) IMPLANT
DRSG COVADERM 4X14 (GAUZE/BANDAGES/DRESSINGS) ×3 IMPLANT
ELECT REM PT RETURN 9FT ADLT (ELECTROSURGICAL) ×6
ELECTRODE REM PT RTRN 9FT ADLT (ELECTROSURGICAL) ×4 IMPLANT
GAUZE SPONGE 4X4 12PLY STRL (GAUZE/BANDAGES/DRESSINGS) ×6 IMPLANT
GLOVE BIO SURGEON STRL SZ 6 (GLOVE) ×15 IMPLANT
GLOVE BIO SURGEON STRL SZ 6.5 (GLOVE) ×6 IMPLANT
GLOVE BIO SURGEON STRL SZ7 (GLOVE) IMPLANT
GLOVE BIO SURGEON STRL SZ7.5 (GLOVE) IMPLANT
GLOVE BIOGEL PI IND STRL 6 (GLOVE) ×4 IMPLANT
GLOVE BIOGEL PI INDICATOR 6 (GLOVE) ×2
GLOVE ORTHO TXT STRL SZ7.5 (GLOVE) ×9 IMPLANT
GOWN STRL REUS W/ TWL LRG LVL3 (GOWN DISPOSABLE) ×8 IMPLANT
GOWN STRL REUS W/TWL LRG LVL3 (GOWN DISPOSABLE) ×4
HEMOSTAT POWDER SURGIFOAM 1G (HEMOSTASIS) ×9 IMPLANT
INSERT FOGARTY XLG (MISCELLANEOUS) ×3 IMPLANT
IV NS IRRIG 3000ML ARTHROMATIC (IV SOLUTION) ×3 IMPLANT
KIT BASIN OR (CUSTOM PROCEDURE TRAY) ×3 IMPLANT
KIT DRAINAGE VACCUM ASSIST (KITS) ×3 IMPLANT
KIT ROOM TURNOVER OR (KITS) ×3 IMPLANT
KIT SUCTION CATH 14FR (SUCTIONS) ×15 IMPLANT
KIT SUT CK MINI COMBO 4X17 (Prosthesis & Implant Heart) ×3 IMPLANT
LEAD PACING MYOCARDI (MISCELLANEOUS) ×3 IMPLANT
LINE VENT (MISCELLANEOUS) ×3 IMPLANT
NS IRRIG 1000ML POUR BTL (IV SOLUTION) ×18 IMPLANT
PACK OPEN HEART (CUSTOM PROCEDURE TRAY) ×3 IMPLANT
PAD ARMBOARD 7.5X6 YLW CONV (MISCELLANEOUS) ×6 IMPLANT
SET CARDIOPLEGIA MPS 5001102 (MISCELLANEOUS) ×3 IMPLANT
SET IRRIG TUBING LAPAROSCOPIC (IRRIGATION / IRRIGATOR) ×3 IMPLANT
SPONGE GAUZE 4X4 12PLY STER LF (GAUZE/BANDAGES/DRESSINGS) ×3 IMPLANT
SPONGE LAP 4X18 X RAY DECT (DISPOSABLE) ×3 IMPLANT
SUT BONE WAX W31G (SUTURE) ×3 IMPLANT
SUT ETHIBON 2 0 V 52N 30 (SUTURE) ×15 IMPLANT
SUT ETHIBON EXCEL 2-0 V-5 (SUTURE) IMPLANT
SUT ETHIBOND 2 0 SH (SUTURE)
SUT ETHIBOND 2 0 SH 36X2 (SUTURE) IMPLANT
SUT ETHIBOND 2 0 V4 (SUTURE) IMPLANT
SUT ETHIBOND 2 0V4 GREEN (SUTURE) IMPLANT
SUT ETHIBOND 4 0 RB 1 (SUTURE) IMPLANT
SUT ETHIBOND V-5 VALVE (SUTURE) IMPLANT
SUT ETHIBOND X763 2 0 SH 1 (SUTURE) ×15 IMPLANT
SUT MNCRL AB 3-0 PS2 18 (SUTURE) ×6 IMPLANT
SUT PDS AB 1 CTX 36 (SUTURE) ×6 IMPLANT
SUT PROLENE 4 0 RB 1 (SUTURE) ×11
SUT PROLENE 4 0 SH DA (SUTURE) ×3 IMPLANT
SUT PROLENE 4-0 RB1 .5 CRCL 36 (SUTURE) ×22 IMPLANT
SUT PROLENE 5 0 C 1 36 (SUTURE) IMPLANT
SUT PROLENE 6 0 C 1 30 (SUTURE) IMPLANT
SUT SILK  1 MH (SUTURE) ×1
SUT SILK 1 MH (SUTURE) ×2 IMPLANT
SUT SILK 2 0 SH CR/8 (SUTURE) IMPLANT
SUT SILK 3 0 SH CR/8 (SUTURE) IMPLANT
SUT STEEL 6MS V (SUTURE) ×6 IMPLANT
SUT STEEL SZ 6 DBL 3X14 BALL (SUTURE) ×3 IMPLANT
SUT VIC AB 2-0 CTX 27 (SUTURE) IMPLANT
SUTURE E-PAK OPEN HEART (SUTURE) ×3 IMPLANT
SYSTEM SAHARA CHEST DRAIN ATS (WOUND CARE) ×3 IMPLANT
TAPE CLOTH SURG 4X10 WHT LF (GAUZE/BANDAGES/DRESSINGS) ×3 IMPLANT
TAPE PAPER 2X10 WHT MICROPORE (GAUZE/BANDAGES/DRESSINGS) ×3 IMPLANT
TOWEL OR 17X24 6PK STRL BLUE (TOWEL DISPOSABLE) ×6 IMPLANT
TOWEL OR 17X26 10 PK STRL BLUE (TOWEL DISPOSABLE) ×6 IMPLANT
TRAY FOLEY IC TEMP SENS 16FR (CATHETERS) ×3 IMPLANT
TUBE SUCT INTRACARD DLP 20F (MISCELLANEOUS) ×3 IMPLANT
UNDERPAD 30X30 INCONTINENT (UNDERPADS AND DIAPERS) ×3 IMPLANT
VALVE MAGNA EASE AORTIC 25MM (Prosthesis & Implant Heart) ×3 IMPLANT
VENT LEFT HEART 12002 (CATHETERS) ×3
VRC MALLEABLE SINGLE STG 28FR (MISCELLANEOUS) ×3
WATER STERILE IRR 1000ML POUR (IV SOLUTION) ×6 IMPLANT
YANKAUER SUCT BULB TIP NO VENT (SUCTIONS) ×3 IMPLANT

## 2013-12-05 NOTE — Anesthesia Preprocedure Evaluation (Addendum)
Anesthesia Evaluation  Patient identified by MRN, date of birth, ID band Patient awake    Reviewed: Allergy & Precautions, H&P , NPO status , Patient's Chart, lab work & pertinent test results  Airway       Dental   Pulmonary neg pulmonary ROS,          Cardiovascular hypertension, + Valvular Problems/Murmurs AS     Neuro/Psych negative neurological ROS  negative psych ROS   GI/Hepatic negative GI ROS,   Endo/Other    Renal/GU negative Renal ROS  negative genitourinary   Musculoskeletal negative musculoskeletal ROS (+)   Abdominal   Peds  Hematology negative hematology ROS (+)   Anesthesia Other Findings   Reproductive/Obstetrics negative OB ROS                          Anesthesia Physical Anesthesia Plan  ASA: IV  Anesthesia Plan: General   Post-op Pain Management:    Induction: Intravenous  Airway Management Planned: Oral ETT  Additional Equipment: Arterial line, CVP, PA Cath, TEE and Ultrasound Guidance Line Placement  Intra-op Plan:   Post-operative Plan: Post-operative intubation/ventilation  Informed Consent: I have reviewed the patients History and Physical, chart, labs and discussed the procedure including the risks, benefits and alternatives for the proposed anesthesia with the patient or authorized representative who has indicated his/her understanding and acceptance.   Dental advisory given  Plan Discussed with: CRNA, Anesthesiologist and Surgeon  Anesthesia Plan Comments:         Anesthesia Quick Evaluation

## 2013-12-05 NOTE — Anesthesia Postprocedure Evaluation (Signed)
  Anesthesia Post-op Note  Patient: Patrick Walter  Procedure(s) Performed: Procedure(s): AORTIC VALVE REPLACEMENT (AVR) (N/A) INTRAOPERATIVE TRANSESOPHAGEAL ECHOCARDIOGRAM (N/A) PATENT FORAMEN OVALE CLOSURE (N/A)  Patient Location: ICU  Anesthesia Type:General  Level of Consciousness: sedated and Patient remains intubated per anesthesia plan  Airway and Oxygen Therapy: Patient remains intubated per anesthesia plan  Post-op Pain: none  Post-op Assessment: Post-op Vital signs reviewed  Post-op Vital Signs: Reviewed  Last Vitals:  Filed Vitals:   12/05/13 1600  BP: 99/69  Pulse: 74  Temp: 36.8 C  Resp: 18    Complications: No apparent anesthesia complications

## 2013-12-05 NOTE — Progress Notes (Signed)
  Echocardiogram Echocardiogram Transesophageal has been performed.  Patrick Walter FRANCES 12/05/2013, 11:16 AM

## 2013-12-05 NOTE — OR Nursing (Signed)
13:30 - 2nd call to SICU charge nurse 

## 2013-12-05 NOTE — Transfer of Care (Signed)
Immediate Anesthesia Transfer of Care Note  Patient: Patrick Walter  Procedure(s) Performed: Procedure(s): AORTIC VALVE REPLACEMENT (AVR) (N/A) INTRAOPERATIVE TRANSESOPHAGEAL ECHOCARDIOGRAM (N/A) PATENT FORAMEN OVALE CLOSURE (N/A)  Patient Location: SICU  Anesthesia Type:General  Level of Consciousness: sedated and Patient remains intubated per anesthesia plan  Airway & Oxygen Therapy: Patient remains intubated per anesthesia plan  Post-op Assessment: Post -op Vital signs reviewed and stable and report to sicu RN  Post vital signs: Reviewed and stable  Complications: No apparent anesthesia complications

## 2013-12-05 NOTE — Brief Op Note (Addendum)
12/05/2013  12:25 PM  PATIENT:  Patrick Walter  58 y.o. male  PRE-OPERATIVE DIAGNOSIS:  Severe aortic stenosis, patent foramen ovale  POST-OPERATIVE DIAGNOSIS:  Severe aortic stenosis, patent foramen ovale  PROCEDURE:   AORTIC VALVE REPLACEMENT (25 mm Edwards Magna Ease pericardial tissue valve) CLOSURE OF PATENT FORAMEN OVALE   SURGEON:    [name], MD  ASSISTANTS:  Suzzanne Cloud, PA-C  ANESTHESIA:   Tiajuana Amass, MD  CROSSCLAMP TIME:   65'  CARDIOPULMONARY BYPASS TIME: 124'  FINDINGS:  Bicuspid native aortic valve with severe aortic stenosis  Normal LV systolic function  Mild to moderate LV hypertrophy  Small patent foramen ovale   Aortic Valve Etiology   Aortic Insufficiency:  Mild  Aortic Valve Disease:  Yes.  Aortic Stenosis:  Yes. Smallest Aortic Valve Area: 0.84cm2; Highest Mean Gradient: 61mmHg.  Etiology (Choose at least one and up to  5 etiologies):  Bicuspid valve disease  Aortic Valve  Procedure Performed:  Replacement: Yes.  Bioprosthetic Valve. Implant Model Number:[id number], Size:25, Unique Device Identifier:[id number]  Repair/Reconstruction: No.   Aortic Annular Enlargement: No.    COMPLICATIONS: None  BASELINE WEIGHT: 114 kg  EBL:  See anesthesia record  SPECIMEN:  Aortic valve leaflets  PATIENT DISPOSITION:   TO SICU IN STABLE CONDITION  [name],[name] 12/05/2013 1:33 PM

## 2013-12-05 NOTE — Interval H&P Note (Signed)
History and Physical Interval Note:  12/05/2013 6:38 AM  Patrick Walter  has presented today for surgery, with the diagnosis of aortic stenosis  The various methods of treatment have been discussed with the patient and family. After consideration of risks, benefits and other options for treatment, the patient has consented to  Procedure(s): AORTIC VALVE REPLACEMENT (AVR) (N/A) INTRAOPERATIVE TRANSESOPHAGEAL ECHOCARDIOGRAM (N/A) as a surgical intervention .  The patient's history has been reviewed, patient examined, no change in status, stable for surgery.  I have reviewed the patient's chart and labs.  Questions were answered to the patient's satisfaction.     Harlan Ervine H

## 2013-12-05 NOTE — Progress Notes (Signed)
RT note: Arterial blood gas from 1752 is an ERROR, was placed under wrong pt.

## 2013-12-05 NOTE — Progress Notes (Signed)
Patient ID: Patrick Walter, male   DOB: 06-02-1955, 58 y.o.   MRN: [id number] EVENING ROUNDS NOTE :     [address]       [place] [address]             [phone]                 Day of Surgery Procedure(s) (LRB): AORTIC VALVE REPLACEMENT (AVR) (N/A) INTRAOPERATIVE TRANSESOPHAGEAL ECHOCARDIOGRAM (N/A) PATENT FORAMEN OVALE CLOSURE (N/A)  Total Length of Stay:  LOS: 0 days  BP 90/55  Pulse 80  Temp(Src) 99 F (37.2 C) (Core (Comment))  Resp 25  Ht 5\' 11"  (1.803 m)  Wt 251 lb 4 oz (113.966 kg)  BMI 35.06 kg/m2  SpO2 97%  .Intake/Output     09/16 0701 - 09/17 0700   I.V. (mL/kg) 3276 (28.7)   Blood 375   IV Piggyback 1400   Total Intake(mL/kg) 5051 (44.3)   Urine (mL/kg/hr) 1710 (1.1)   Emesis/NG output 125 (0.1)   Blood 1500 (1)   Chest Tube 155 (0.1)   Total Output 3490   Net +1561         . sodium chloride 20 mL/hr at 12/05/13 2000  . sodium chloride 20 mL/hr at 12/05/13 1544  . sodium chloride 100 mL/hr at 12/05/13 2000  . dexmedetomidine Stopped (12/05/13 1600)  . insulin (NOVOLIN-R) infusion 1.9 Units/hr (12/05/13 2000)  . lactated ringers 20 mL/hr at 12/05/13 2000  . nitroGLYCERIN Stopped (12/05/13 1400)  . phenylephrine (NEO-SYNEPHRINE) Adult infusion 10 mcg/min (12/05/13 2000)     Lab Results  Component Value Date   WBC 13.4* 12/05/2013   HGB 9.5* 12/05/2013   HCT 28.0* 12/05/2013   PLT 128* 12/05/2013   GLUCOSE 135* 12/05/2013   CHOL 185 08/31/2013   TRIG 86 08/31/2013   HDL 45 08/31/2013   LDLCALC 123* 08/31/2013   ALT 21 12/03/2013   AST 19 12/03/2013   NA 134* 12/05/2013   K 4.2 12/05/2013   CL 115* 12/05/2013   CREATININE 1.00 12/05/2013   BUN 16 12/05/2013   CO2 21 12/03/2013   TSH 2.951 08/31/2013   INR 1.31 12/05/2013   HGBA1C 5.4 12/03/2013   Now extubated  Neuro intact  Grace Isaac MD  Beeper [phone] Office [phone] 12/05/2013 8:23 PM

## 2013-12-05 NOTE — OR Nursing (Signed)
13:00 - 1st call to SICU charge nurse

## 2013-12-05 NOTE — Op Note (Signed)
CARDIOTHORACIC SURGERY OPERATIVE NOTE  Date of Procedure:  12/05/2013  Preoperative Diagnosis:   Severe Aortic Stenosis   Patent Foramen Ovale  Postoperative Diagnosis: Same   Procedure:    Aortic Valve Replacement  [employer] Magna Ease Pericardial Tissue Valve (size 25 mm, model # 3300TFX, serial # [id number])   Closure of Patent Foramen Ovale   Surgeon: Valentina Gu. Roxy Manns, MD  Assistant: Suzzanne Cloud, PA-C  Anesthesia: Tiajuana Amass, MD  Operative Findings: Bicuspid native aortic valve with severe aortic stenosis  Normal LV systolic function  Mild to moderate LV hypertrophy  Small patent foramen ovale        BRIEF CLINICAL NOTE AND INDICATIONS FOR SURGERY  Patient is a 59 year old white male with recently discovered bicuspid aortic valve and severe aortic stenosis referred for surgical consultation. The patient states that he was told he had a heart murmur many years ago, but he never underwent any sort of formal cardiac evaluation. He has remained otherwise healthy and reasonably active physically for all of his life. He was recently evaluated by his primary care physician and noted to have a prominent systolic murmur on routine physical exam. An echocardiogram was performed demonstrating what was reported to be moderate to severe aortic stenosis, although the peak velocity across the aortic valve was measured > 4.5 m/sec. He was referred to Dr. Burt Knack for consultation. At the time the patient denied any symptoms of exertional shortness of breath or chest discomfort, but he admitted that he had not been exercising regularly for quite some time. Review of the patient's transthoracic echocardiogram suggested the possibility that the patient might have severe aortic stenosis with a congenitally bicuspid valve. He subsequently underwent transesophageal echocardiogram and exercise stress testing. Transesophageal echocardiogram confirmed the presence of severe aortic stenosis  with congenital a bicuspid aortic valve. Peak velocity across the aortic valve measured > 5 m/sec and the mean transvalvular gradient was estimated to be 68 mmHg. There appeared to be a small patent foramen ovale.  On exercise treadmill the patient developed exertional chest discomfort early into stage III of a conventional Bruce protocol. The patient subsequently underwent left and right heart catheterization which revealed only minor diffuse nonobstructive coronary artery disease and normal pulmonary artery pressures. The aortic root appeared somewhat dilated at catheterization. The patient was scheduled for cardiac gated CT angiogram of the heart to rule out significant aneurysmal enlargement of the aortic root and proximal descending thoracic aorta and referred for elective surgical consultation.  The patient has been seen in consultation and counseled at length regarding the indications, risks and potential benefits of surgery.  All questions have been answered, and the patient provides full informed consent for the operation as described.     DETAILS OF THE OPERATIVE PROCEDURE  Preparation:  The patient is brought to the operating room on the above mentioned date and central monitoring was established by the anesthesia team including placement of Swan-Ganz catheter and radial arterial line. The patient is placed in the supine position on the operating table.  Intravenous antibiotics are administered. General endotracheal anesthesia is induced uneventfully. A Foley catheter is placed.  Baseline transesophageal echocardiogram was performed.  Findings were notable for bicuspid native aortic valve with severe aortic stenosis. There was mild aortic insufficiency. There was normal left ventricular systolic function with mild to moderate left ventricular hypertrophy. A patent foramen ovale could not clearly be identified although a flap was easily visualized in the intra-atrial septum.  The patient's  chest, abdomen, both groins,  and both lower extremities are prepared and draped in a sterile manner. A time out procedure is performed.   Surgical Approach:  A median sternotomy incision was performed and the pericardium is opened. The ascending aorta is normal in appearance.    Extracorporeal Cardiopulmonary Bypass and Myocardial Protection:  The ascending aorta and the right atrium are cannulated for cardiopulmonary bypass.  Adequate heparinization is verified.   A retrograde cardioplegia cannula is placed through the right atrium into the coronary sinus.  The operative field was continuously flooded with carbon dioxide gas.  The entire pre-bypass portion of the operation was notable for stable hemodynamics.  Cardiopulmonary bypass was begun and the surface of the heart is inspected.  A second venous cannula is placed directly in the superior vena cava while the first venous cannula is advanced into the inferior vena cava.  A cardioplegia cannula is placed in the ascending aorta.  A temperature probe was placed in the interventricular septum.  The patient is cooled to 32C systemic temperature.  The aortic cross clamp is applied and cold blood cardioplegia is delivered initially in an antegrade fashion through the aortic root.   Supplemental cardioplegia is given retrograde through the coronary sinus catheter.  Iced saline slush is applied for topical hypothermia.  The initial cardioplegic arrest is rapid with early diastolic arrest.  Repeat doses of cardioplegia are administered intermittently throughout the entire cross clamp portion of the operation through the coronary sinus catheter in order to maintain completely flat electrocardiogram and septal myocardial temperature below 15C.  Myocardial protection was felt to be excellent.   Aortic Valve Replacement:  An oblique transverse aortotomy incision was performed.  The aortic valve was inspected and notable for bicuspid native valve with  fusion of the left and non-coronary leaflets.  There was severe aortic stenosis.  The aortic valve leaflets were excised sharply and the aortic annulus decalcified.  Decalcification was notably tedious due to extensive leaflet and annular calcification.  The aortic annulus was sized to accept a 25 mm prosthesis.  The aortic root and left ventricle were irrigated with copious cold saline solution.  Aortic valve replacement was performed using interrupted horizontal mattress 2-0 Ethibond pledgeted sutures with pledgets in the subannular position.  An [facility] Ease pericardial tissue valve (size 25 mm, model # 3300TFX, serial # [id number]) was implanted uneventfully. The valve seated appropriately with adequate space beneath the left main and right coronary artery.  The aortotomy was closed using a 2-layer closure of running 4-0 Prolene suture.  One final dose of warm retrograde "hot shot" cardioplegia was administered retrograde through the coronary sinus catheter while all air was evacuated through the aortic root.  The aortic cross clamp was removed after a total cross clamp time of 77 minutes.   Closure of Patent Foramen Ovale:  A small incision is made in the lateral wall of the right atrium. The intra-atrial septum is inspected. There is a small patent foramen ovale. This is closed using running 4-0 Prolene suture. The incision in the atrial wall is closed using running 4-0 Prolene suture.   Procedure Completion:  Epicardial pacing wires are fixed to the right ventricular outflow tract and to the right atrial appendage. The patient is rewarmed to 37C temperature. The aortic and left ventricular vents are removed.  The patient is weaned and disconnected from cardiopulmonary bypass.  The patient's rhythm at separation from bypass was sinus.  The patient was weaned from cardioplegic bypass without any inotropic support. Total cardiopulmonary  bypass time for the operation was 124 minutes.  Followup  transesophageal echocardiogram performed after separation from bypass revealed a well-seated aortic valve prosthesis that was functioning normally and without any sign of perivalvular leak.  Left ventricular function was unchanged from preoperatively.  The aortic and venous cannula were removed uneventfully. Protamine was administered to reverse the anticoagulation. The mediastinum and pleural space were inspected for hemostasis and irrigated with saline solution. The mediastinum was drained using 2 chest tubes placed through separate stab incisions inferiorly.  The soft tissues anterior to the aorta were reapproximated loosely. The sternum is closed with double strength sternal wire. The soft tissues anterior to the sternum were closed in multiple layers and the skin is closed with a running subcuticular skin closure.  The post-bypass portion of the operation was notable for stable rhythm and hemodynamics.  No blood products were administered during the operation.   Disposition:  The patient tolerated the procedure well and is transported to the surgical intensive care in stable condition. There are no intraoperative complications. All sponge instrument and needle counts are verified correct at completion of the operation.    Valentina Gu. Roxy Manns MD 12/05/2013 1:37 PM

## 2013-12-05 NOTE — Procedures (Signed)
Extubation Procedure Note  Patient Details:   Name: Patrick Walter DOB: 1956/02/03 MRN: [id number]   Airway Documentation:  Airway 8 mm (Active)  Secured at (cm) 8 cm 12/05/2013  2:03 PM  Measured From Lips 12/05/2013  2:03 PM  Secured Location Right 12/05/2013  2:03 PM  Secured By Pink Tape 12/05/2013  2:03 PM  Cuff Pressure (cm H2O) 24 cm H2O 12/05/2013  2:03 PM  Site Condition Dry 12/05/2013  2:03 PM    Evaluation  O2 sats: stable throughout Complications: No apparent complications Patient did tolerate procedure well. Bilateral Breath Sounds: Clear Suctioning: Airway Yes Pt. weaned per surgical vent wean protocol, able to hold head off bed on own, NIF-(-20)cmh20, FVC-(1.8)L, (+) cuff leak, placed on 4lpm humidified 02 s/p extubation, pt. Tolerated well with no complication, I/S started with 1200 cc obtained with first attempt, RT to monitor. [name] 12/05/2013, 6:23 PM

## 2013-12-05 NOTE — Anesthesia Procedure Notes (Signed)
Anesthesia Procedure Note PA catheter:  Routine monitors. Timeout, sterile prep, drape, FBP R neck.  Trendelenburg position.  1% Lido local, finder and trocar RIJ 1st pass with US guidance.  Cordis placed over J wire. PA catheter in easily.  Sterile dressing applied.  Patient tolerated well, VSS.  Jenita Seashore, MD  [phone]

## 2013-12-06 ENCOUNTER — Encounter (HOSPITAL_COMMUNITY): Payer: Self-pay | Admitting: Thoracic Surgery (Cardiothoracic Vascular Surgery)

## 2013-12-06 ENCOUNTER — Inpatient Hospital Stay (HOSPITAL_COMMUNITY): Payer: 59

## 2013-12-06 LAB — GLUCOSE, CAPILLARY
GLUCOSE-CAPILLARY: 109 mg/dL — AB (ref 70–99)
GLUCOSE-CAPILLARY: 123 mg/dL — AB (ref 70–99)
GLUCOSE-CAPILLARY: 127 mg/dL — AB (ref 70–99)
GLUCOSE-CAPILLARY: 132 mg/dL — AB (ref 70–99)
Glucose-Capillary: 106 mg/dL — ABNORMAL HIGH (ref 70–99)
Glucose-Capillary: 108 mg/dL — ABNORMAL HIGH (ref 70–99)
Glucose-Capillary: 113 mg/dL — ABNORMAL HIGH (ref 70–99)
Glucose-Capillary: 113 mg/dL — ABNORMAL HIGH (ref 70–99)
Glucose-Capillary: 117 mg/dL — ABNORMAL HIGH (ref 70–99)
Glucose-Capillary: 120 mg/dL — ABNORMAL HIGH (ref 70–99)
Glucose-Capillary: 120 mg/dL — ABNORMAL HIGH (ref 70–99)
Glucose-Capillary: 120 mg/dL — ABNORMAL HIGH (ref 70–99)
Glucose-Capillary: 122 mg/dL — ABNORMAL HIGH (ref 70–99)
Glucose-Capillary: 123 mg/dL — ABNORMAL HIGH (ref 70–99)
Glucose-Capillary: 126 mg/dL — ABNORMAL HIGH (ref 70–99)
Glucose-Capillary: 126 mg/dL — ABNORMAL HIGH (ref 70–99)
Glucose-Capillary: 128 mg/dL — ABNORMAL HIGH (ref 70–99)
Glucose-Capillary: 129 mg/dL — ABNORMAL HIGH (ref 70–99)
Glucose-Capillary: 132 mg/dL — ABNORMAL HIGH (ref 70–99)
Glucose-Capillary: 132 mg/dL — ABNORMAL HIGH (ref 70–99)
Glucose-Capillary: 134 mg/dL — ABNORMAL HIGH (ref 70–99)
Glucose-Capillary: 140 mg/dL — ABNORMAL HIGH (ref 70–99)
Glucose-Capillary: 145 mg/dL — ABNORMAL HIGH (ref 70–99)
Glucose-Capillary: 147 mg/dL — ABNORMAL HIGH (ref 70–99)
Glucose-Capillary: 79 mg/dL (ref 70–99)

## 2013-12-06 LAB — BLOOD GAS, ARTERIAL
Acid-base deficit: 1.8 mmol/L (ref 0.0–2.0)
Bicarbonate: 22.9 mEq/L (ref 20.0–24.0)
Drawn by: 41881
O2 Content: 6 L/min
O2 Saturation: 96 %
PH ART: 7.358 (ref 7.350–7.450)
PO2 ART: 79.5 mmHg — AB (ref 80.0–100.0)
Patient temperature: 98.6
TCO2: 24.2 mmol/L (ref 0–100)
pCO2 arterial: 41.8 mmHg (ref 35.0–45.0)

## 2013-12-06 LAB — BASIC METABOLIC PANEL
Anion gap: 12 (ref 5–15)
BUN: 16 mg/dL (ref 6–23)
CO2: 23 meq/L (ref 19–32)
Calcium: 8.2 mg/dL — ABNORMAL LOW (ref 8.4–10.5)
Chloride: 102 mEq/L (ref 96–112)
Creatinine, Ser: 0.97 mg/dL (ref 0.50–1.35)
GFR calc Af Amer: >90 mL/min (ref >90)
GFR calc non Af Amer: >90 mL/min (ref >90)
GLUCOSE: 129 mg/dL — AB (ref 70–99)
Potassium: 4.6 mEq/L (ref 3.7–5.3)
Sodium: 137 mEq/L (ref 137–147)

## 2013-12-06 LAB — CBC
HCT: 31.1 % — ABNORMAL LOW (ref 39.0–52.0)
HCT: 32 % — ABNORMAL LOW (ref 39.0–52.0)
HEMOGLOBIN: 10.2 g/dL — AB (ref 13.0–17.0)
HEMOGLOBIN: 10.5 g/dL — AB (ref 13.0–17.0)
MCH: 27.9 pg (ref 26.0–34.0)
MCH: 28.2 pg (ref 26.0–34.0)
MCHC: 32.8 g/dL (ref 30.0–36.0)
MCHC: 32.8 g/dL (ref 30.0–36.0)
MCV: 85.2 fL (ref 78.0–100.0)
MCV: 85.8 fL (ref 78.0–100.0)
Platelets: 145 10*3/uL — ABNORMAL LOW (ref 150–400)
Platelets: 134 10*3/uL — ABNORMAL LOW (ref 150–400)
RBC: 3.65 MIL/uL — AB (ref 4.22–5.81)
RBC: 3.73 MIL/uL — ABNORMAL LOW (ref 4.22–5.81)
RDW: 13.5 % (ref 11.5–15.5)
RDW: 13.9 % (ref 11.5–15.5)
WBC: 13 10*3/uL — ABNORMAL HIGH (ref 4.0–10.5)
WBC: 12.3 10*3/uL — ABNORMAL HIGH (ref 4.0–10.5)

## 2013-12-06 LAB — CREATININE, SERUM
CREATININE: 1.24 mg/dL (ref 0.50–1.35)
GFR calc Af Amer: 73 mL/min — ABNORMAL LOW (ref >90)
GFR calc non Af Amer: 63 mL/min — ABNORMAL LOW (ref >90)

## 2013-12-06 LAB — MAGNESIUM
MAGNESIUM: 2.6 mg/dL — AB (ref 1.5–2.5)
MAGNESIUM: 2.4 mg/dL (ref 1.5–2.5)

## 2013-12-06 MED ORDER — MIDAZOLAM HCL 2 MG/2ML IJ SOLN
INTRAMUSCULAR | Status: AC
  Administered 2013-12-06: 2 mg via INTRAVENOUS
  Filled 2013-12-06: qty 2

## 2013-12-06 MED ORDER — INSULIN DETEMIR 100 UNIT/ML ~~LOC~~ SOLN
20.0000 [IU] | Freq: Every day | SUBCUTANEOUS | Status: DC
  Filled 2013-12-06: qty 0.2

## 2013-12-06 MED ORDER — SODIUM CHLORIDE 0.9 % IJ SOLN
3.0000 mL | Freq: Two times a day (BID) | INTRAMUSCULAR | Status: DC
  Administered 2013-12-06 – 2013-12-09 (×7): 3 mL via INTRAVENOUS

## 2013-12-06 MED ORDER — FUROSEMIDE 40 MG PO TABS
40.0000 mg | ORAL_TABLET | Freq: Every day | ORAL | Status: DC
  Administered 2013-12-07 – 2013-12-08 (×2): 40 mg via ORAL
  Filled 2013-12-06 (×3): qty 1

## 2013-12-06 MED ORDER — TRAMADOL HCL 50 MG PO TABS
50.0000 mg | ORAL_TABLET | ORAL | Status: DC | PRN
  Administered 2013-12-06 – 2013-12-09 (×10): 100 mg via ORAL
  Filled 2013-12-06 (×10): qty 2

## 2013-12-06 MED ORDER — KETOROLAC TROMETHAMINE 30 MG/ML IJ SOLN
30.0000 mg | Freq: Four times a day (QID) | INTRAMUSCULAR | Status: AC
  Administered 2013-12-06 – 2013-12-07 (×4): 30 mg via INTRAVENOUS
  Filled 2013-12-06 (×7): qty 1

## 2013-12-06 MED ORDER — INSULIN DETEMIR 100 UNIT/ML ~~LOC~~ SOLN
20.0000 [IU] | Freq: Once | SUBCUTANEOUS | Status: AC
  Administered 2013-12-06: 20 [IU] via SUBCUTANEOUS
  Filled 2013-12-06: qty 0.2

## 2013-12-06 MED ORDER — MOVING RIGHT ALONG BOOK
Freq: Once | Status: AC
Start: 2013-12-06 — End: 2013-12-06
  Administered 2013-12-06: 09:00:00
  Filled 2013-12-06: qty 1

## 2013-12-06 MED ORDER — OXYCODONE HCL 5 MG PO TABS
5.0000 mg | ORAL_TABLET | ORAL | Status: DC | PRN
  Administered 2013-12-07: 5 mg via ORAL
  Filled 2013-12-06: qty 2
  Filled 2013-12-06: qty 1
  Filled 2013-12-06: qty 2

## 2013-12-06 MED ORDER — MIDAZOLAM HCL 2 MG/2ML IJ SOLN
2.0000 mg | Freq: Once | INTRAMUSCULAR | Status: AC
  Administered 2013-12-06: 2 mg via INTRAVENOUS

## 2013-12-06 MED ORDER — SODIUM CHLORIDE 0.9 % IV SOLN: 250.0000 mL | INTRAVENOUS | Status: DC | PRN

## 2013-12-06 MED ORDER — POTASSIUM CHLORIDE CRYS ER 20 MEQ PO TBCR
20.0000 meq | EXTENDED_RELEASE_TABLET | Freq: Every day | ORAL | Status: DC
  Administered 2013-12-07 – 2013-12-08 (×2): 20 meq via ORAL
  Filled 2013-12-06 (×3): qty 1

## 2013-12-06 MED ORDER — CHLORHEXIDINE GLUCONATE 0.12 % MT SOLN
15.0000 mL | Freq: Two times a day (BID) | OROMUCOSAL | Status: DC
  Administered 2013-12-06 – 2013-12-08 (×5): 15 mL via OROMUCOSAL
  Filled 2013-12-06 (×9): qty 15

## 2013-12-06 MED ORDER — MORPHINE SULFATE 2 MG/ML IJ SOLN: 2.0000 mg | INTRAMUSCULAR | Status: DC | PRN

## 2013-12-06 MED ORDER — SODIUM CHLORIDE 0.9 % IJ SOLN: 3.0000 mL | INTRAMUSCULAR | Status: DC | PRN

## 2013-12-06 MED ORDER — ASPIRIN EC 325 MG PO TBEC
325.0000 mg | DELAYED_RELEASE_TABLET | Freq: Every day | ORAL | Status: DC
  Administered 2013-12-06 – 2013-12-08 (×3): 325 mg via ORAL
  Filled 2013-12-06 (×4): qty 1

## 2013-12-06 MED ORDER — FUROSEMIDE 10 MG/ML IJ SOLN
20.0000 mg | Freq: Four times a day (QID) | INTRAMUSCULAR | Status: AC
  Administered 2013-12-06 (×3): 20 mg via INTRAVENOUS
  Filled 2013-12-06 (×3): qty 2

## 2013-12-06 MED ORDER — INSULIN ASPART 100 UNIT/ML ~~LOC~~ SOLN
0.0000 [IU] | SUBCUTANEOUS | Status: DC
  Administered 2013-12-06 – 2013-12-07 (×3): 2 [IU] via SUBCUTANEOUS

## 2013-12-06 MED ORDER — CETYLPYRIDINIUM CHLORIDE 0.05 % MT LIQD
7.0000 mL | Freq: Two times a day (BID) | OROMUCOSAL | Status: DC
  Administered 2013-12-06 – 2013-12-08 (×3): 7 mL via OROMUCOSAL

## 2013-12-06 NOTE — Progress Notes (Signed)
[facility] BRIEF PROGRESS NOTE  1 Day Post-Op  S/P Procedure(s) (LRB): AORTIC VALVE REPLACEMENT (AVR) (N/A) INTRAOPERATIVE TRANSESOPHAGEAL ECHOCARDIOGRAM (N/A) PATENT FORAMEN OVALE CLOSURE (N/A)   Doing well after transfer to step-down  Plan: Continue routine early postop  Wolfe Camarena H 12/06/2013 3:27 PM

## 2013-12-06 NOTE — Progress Notes (Signed)
A-line dampened at 0200 and would not draw back. Dressing intact and site unremarkable. Repositioning of hand attempted with no success. RT notified and assessed as well. A-line noted to be non-functioning. Pt has not been on any vasoactive drips for several hours. A-line removed by RT. Will continue to monitor.

## 2013-12-06 NOTE — Progress Notes (Signed)
Utilization review completed. Debe Anfinson, RN, BSN. 

## 2013-12-06 NOTE — Progress Notes (Addendum)
[address]       [place] [address]             [phone]      1 Day Post-Op Procedure(s) (LRB): AORTIC VALVE REPLACEMENT (AVR) (N/A) INTRAOPERATIVE TRANSESOPHAGEAL ECHOCARDIOGRAM (N/A) PATENT FORAMEN OVALE CLOSURE (N/A)  Subjective:  Patrick Walter complains of chest discomfort this morning.  Otherwise he states he is doing okay.  Objective: Vital signs in last 24 hours: Temp:  [98.2 F (36.8 C)-99.9 F (37.7 C)] 99.3 F (37.4 C) (09/17 0810) Pulse Rate:  [67-81] 79 (09/17 0810) Cardiac Rhythm:  [-] Normal sinus rhythm (09/17 0810) Resp:  [10-31] 18 (09/17 0810) BP: (64-125)/(42-82) 108/75 mmHg (09/17 0800) SpO2:  [94 %-100 %] 100 % (09/17 0810) Arterial Line BP: (40-132)/(35-67) 40/36 mmHg (09/17 0215) FiO2 (%):  [50 %-100 %] 50 % (09/16 1600) Weight:  [254 lb 3.1 oz (115.3 kg)] 254 lb 3.1 oz (115.3 kg) (09/17 0530)  Hemodynamic parameters for last 24 hours: PAP: (21-40)/(9-27) 25/12 mmHg CO:  [4.6 L/min-7.1 L/min] 6.3 L/min CI:  [2 L/min/m2-2.9 L/min/m2] 2.7 L/min/m2  Intake/Output from previous day: 09/16 0701 - 09/17 0700 In: 6288.5 [I.V.:4263.5; Blood:375; IV Piggyback:1650] Out: 8546 [Urine:2320; Emesis/NG output:125; Blood:1500; Chest Tube:375] Intake/Output this shift: Total I/O In: 42.6 [I.V.:42.6] Out: 145 [Urine:125; Chest Tube:20]  General appearance: alert, cooperative and no distress Heart: regular rate and rhythm Lungs: clear to auscultation bilaterally Abdomen: soft, non-tender; bowel sounds normal; no masses,  no organomegaly Extremities: edema minimal Wound: clean and dry  Lab Results:  Recent Labs  12/05/13 2000 12/05/13 2001 12/06/13 0400  WBC 13.4*  --  13.0*  HGB 10.4* 9.5* 10.2*  HCT 30.9* 28.0* 31.1*  PLT 128*  --  145*   BMET:  Recent Labs  12/03/13 1344  12/05/13 2001 12/06/13 0400  NA 137  < > 134* 137  K 4.2  < > 4.2 4.6  CL 101  < > 115* 102  CO2 21  --   --  23  GLUCOSE 125*  < > 135*  129*  BUN 29*  < > 16 16  CREATININE 1.15  < > 1.00 0.97  CALCIUM 10.2  --   --  8.2*  < > = values in this interval not displayed.  PT/INR:  Recent Labs  12/05/13 1400  LABPROT 16.3*  INR 1.31   ABG    Component Value Date/Time   PHART 7.358 12/06/2013 0417   HCO3 22.9 12/06/2013 0417   TCO2 24.2 12/06/2013 0417   ACIDBASEDEF 1.8 12/06/2013 0417   O2SAT 96.0 12/06/2013 0417   CBG (last 3)   Recent Labs  12/06/13 0501 12/06/13 0600 12/06/13 0759  GLUCAP 120* 120* 129*    Assessment/Plan: S/P Procedure(s) (LRB): AORTIC VALVE REPLACEMENT (AVR) (N/A) INTRAOPERATIVE TRANSESOPHAGEAL ECHOCARDIOGRAM (N/A) PATENT FORAMEN OVALE CLOSURE (N/A)  1. CV- hemodynamically stable, off all drips, start low dose Beta Blocker, continue ASA 2. Pulm- wean oxygen as tolerated, CXR looks good, no significant pleural effusion, some atelectasis 3. Renal- creatinine, lytes okay minimal edema, can likely hold off on Lasix for now 4. Expected Acute Blood Loss Anemia, mild Hgb is 10.2 5. Pain control- will utilize Oxy IR, but will add Toradol  6. CBGs- remains on insulin drip, sugars running in the 120s, will add Levemir and hopefully wean drip 7. Dispo- patient doing very well, off all drips, add Toradol for pain, POD #1 progression orders, transfer to 2000   LOS: 1 day  Patrick Walter 12/06/2013  I have seen and examined the patient and agree with the assessment and plan as outlined.  Doing well POD1.  D/C tubes and lines.  Mobilize.  Diuresis.  Transfer step down.  Patrick Walter 12/06/2013 8:53 AM

## 2013-12-07 ENCOUNTER — Inpatient Hospital Stay (HOSPITAL_COMMUNITY): Payer: 59

## 2013-12-07 LAB — BASIC METABOLIC PANEL
Anion gap: 12 (ref 5–15)
BUN: 23 mg/dL (ref 6–23)
CALCIUM: 8.5 mg/dL (ref 8.4–10.5)
CHLORIDE: 101 meq/L (ref 96–112)
CO2: 25 mEq/L (ref 19–32)
CREATININE: 1.18 mg/dL (ref 0.50–1.35)
GFR, EST AFRICAN AMERICAN: 78 mL/min — AB (ref >90)
GFR, EST NON AFRICAN AMERICAN: 67 mL/min — AB (ref >90)
Glucose, Bld: 129 mg/dL — ABNORMAL HIGH (ref 70–99)
Potassium: 4.6 mEq/L (ref 3.7–5.3)
Sodium: 138 mEq/L (ref 137–147)

## 2013-12-07 LAB — CBC
HCT: 29.5 % — ABNORMAL LOW (ref 39.0–52.0)
HEMOGLOBIN: 9.6 g/dL — AB (ref 13.0–17.0)
MCH: 27.9 pg (ref 26.0–34.0)
MCHC: 32.5 g/dL (ref 30.0–36.0)
MCV: 85.8 fL (ref 78.0–100.0)
Platelets: 123 10*3/uL — ABNORMAL LOW (ref 150–400)
RBC: 3.44 MIL/uL — ABNORMAL LOW (ref 4.22–5.81)
RDW: 13.9 % (ref 11.5–15.5)
WBC: 12.3 10*3/uL — ABNORMAL HIGH (ref 4.0–10.5)

## 2013-12-07 LAB — GLUCOSE, CAPILLARY
Glucose-Capillary: 123 mg/dL — ABNORMAL HIGH (ref 70–99)
Glucose-Capillary: 115 mg/dL — ABNORMAL HIGH (ref 70–99)
Glucose-Capillary: 100 mg/dL — ABNORMAL HIGH (ref 70–99)
Glucose-Capillary: 111 mg/dL — ABNORMAL HIGH (ref 70–99)

## 2013-12-07 MED ORDER — LACTULOSE 10 GM/15ML PO SOLN
10.0000 g | Freq: Every day | ORAL | Status: DC | PRN
  Administered 2013-12-07: 10 g via ORAL
  Filled 2013-12-07: qty 15

## 2013-12-07 MED ORDER — MAGNESIUM HYDROXIDE 400 MG/5ML PO SUSP: 30.0000 mL | Freq: Every day | ORAL | Status: DC | PRN

## 2013-12-07 NOTE — Discharge Summary (Signed)
Physician Discharge Summary  Patient ID: Patrick Walter MRN: [id number] DOB/AGE: [birth year] 58 y.o.  Admit date: 12/05/2013 Discharge date: 12/09/2013  Admission Diagnoses:  Patient Active Problem List   Diagnosis Date Noted  . Bicuspid aortic valve   . Patent foramen ovale 10/23/2013  . Aortic stenosis 07/24/2013  . Low back pain 07/24/2013  . Essential hypertension, benign 07/24/2013  . Preventive measure 07/24/2013   Discharge Diagnoses:   Patient Active Problem List   Diagnosis Date Noted  . S/P aortic valve replacement with bioprosthetic valve 12/05/2013  . Bicuspid aortic valve   . Patent foramen ovale 10/23/2013  . Aortic stenosis 07/24/2013  . Low back pain 07/24/2013  . Essential hypertension, benign 07/24/2013  . Preventive measure 07/24/2013   Expected postoperative blood loss anemia   Discharged Condition: good  History of Present Illness:   Patrick Walter is a 58 year old white male with recently discovered bicuspid aortic valve and severe aortic stenosis. The patient states that he was told he had a heart murmur many years ago, but he never underwent any sort of formal cardiac evaluation. He has remained otherwise healthy and reasonably active for all of his life. He was recently evaluated by his primary care physician and noted to have a prominent systolic murmur on routine physical exam. An echocardiogram was performed demonstrating what was reported to be moderate to severe aortic stenosis, although the peak velocity across the aortic valve was measured > 4.5 m/sec. He was referred to Dr. Burt Knack for consultation. At the time the patient denied any symptoms of exertional shortness of breath or chest discomfort, but he admitted that he had not been exercising regularly for quite some time. Review of the patient's transthoracic echocardiogram suggested the possibility that the patient might have severe aortic stenosis with a congenitally bicuspid valve. He  subsequently underwent transesophageal echocardiogram and exercise stress testing. Transesophageal echocardiogram confirmed the presence of severe aortic stenosis with congenital a bicuspid aortic valve. Peak velocity across the aortic valve measured > 5 m/sec and the mean transvalvular gradient was estimated to be 68 mmHg. On exercise treadmill the patient developed exertional chest discomfort early into stage III of a conventional Bruce protocol. The patient subsequently underwent left and right heart catheterization which revealed only minor diffuse nonobstructive coronary artery disease and normal pulmonary artery pressures. The aortic root appeared somewhat dilated at catheterization. The patient was scheduled for cardiac gated CT angiogram of the heart to rule out significant aneurysmal enlargement of the aortic root and proximal descending thoracic aorta.  It was felt surgical intervention would be indicated and he was referred to [facility] for surgical evaluation.  He was evaluated by Dr. Roxy Manns on 11/16/2013 at which time it was felt patient would benefit from Aortic Valve Replacement.  The risks and benefits of the procedure were explained to the patient and his wife and he was agreeable to proceed with surgery.  Hospital Course:   Patrick Walter presented to [facility] on 12/05/2013.  He underwent Aortic Valve Replacement utilizing a 25 mm [employer] Ease Pericardial Tissue Valve.  He also underwent closure of a Patent Foramen Ovale.  He tolerated the procedure without difficulty and was taken to the SICU in stable condition.  The patient was extubated without difficulty the evening of surgery.  During his stay in the SICU the patient did very well.  He remained hemodynamically stable and did not require any additional cardiac support.  He did not requiring pacing and was maintaining NSR.  His chest tube and arterial lines were removed without difficulty.  He was felt medically stable  for transfer to the step down unit on POD #1.  The patient continues to progress.  He is maintaining NSR and his pacing wires will be removed prior to discharge.  He is not a diabetic and was weaned off insulin with good control of blood sugars with a carb modified diet.  He is ambulating without difficulty.  He has overall progressed well and is medically stable for discharge home on 12/09/2013.   Significant Diagnostic Studies: cardiac graphics: Echocardiogram:   - Left ventricle: Hypertrophy was noted. Systolic function was normal. The estimated ejection fraction was in the range of 60% to 65%. Wall motion was normal; there were no regional wall motion abnormalities. - Aortic valve: Calcification. There was severe stenosis. - Mitral valve: No evidence of vegetation. - Left atrium: No evidence of thrombus in the appendage. - Right atrium: No evidence of thrombus in the atrial cavity or appendage. - Atrial septum: No defect or patent foramen ovale was identified. Echo contrast study showed no right-to-left atrial level shunt, following an increase in RA pressure induced by provocative maneuvers. - Tricuspid valve: No evidence of vegetation. - Pulmonic valve: No evidence of vegetation.   Treatments: surgery:   Aortic Valve Replacement [employer] Magna Ease Pericardial Tissue Valve (size 25 mm, model # 3300TFX, serial # [id number])  Closure of Patent Foramen Ovale   Disposition: 01-Home or Self Care   Discharge Medications:   The patient has been discharged on:   1.Beta Blocker:  Yes [ x  ]                              No   [   ]                              If No, reason:  2.Ace Inhibitor/ARB: Yes [   ]                                     No  [ x   ]                                     If No, reason: Labile Blood pressure, NO CAD  3.Statin:   Yes [   ]                  No  [ x  ]                  If No, reason: NO CAD  4.[place]:  Yes  [ x  ]                  No   [   ]                   If No, reason:     Follow-up Information   Follow up with [name], MD On 01/07/2014. (Appointment is at 2:30)    Specialty:  Cardiothoracic Surgery   Contact information:   [address] [place] [address] [phone]       Follow up with [facility]  On 01/07/2014. (Please get CXR at 1:30, located on first floor of [facility])    Contact information:   [place]       Follow up with Richardson Dopp, PA-C On 12/26/2013. (Appointment is at 10:30)    Specialty:  Physician Assistant   Contact information:   [place] [place] [address] [phone]       Signed: Burke Keels 12/09/2013, 8:30 AM

## 2013-12-07 NOTE — Progress Notes (Addendum)
       [address]       [place] [address]             [phone]          2 Days Post-Op Procedure(s) (LRB): AORTIC VALVE REPLACEMENT (AVR) (N/A) INTRAOPERATIVE TRANSESOPHAGEAL ECHOCARDIOGRAM (N/A) PATENT FORAMEN OVALE CLOSURE (N/A)  Subjective: Some nausea overnight, c/o constipation.  Otherwise feels ok.    Objective: Vital signs in last 24 hours: Patient Vitals for the past 24 hrs:  BP Temp Temp src Pulse Resp SpO2 Weight  12/07/13 0448 - - - - - 98 % -  12/07/13 0434 118/61 mmHg 98.7 F (37.1 C) Oral 74 18 91 % 264 lb 5.3 oz (119.9 kg)  12/06/13 2142 - 98.9 F (37.2 C) Oral - 18 94 % -  12/06/13 2139 107/65 mmHg - - 79 - - -  12/06/13 1300 106/64 mmHg - - 74 18 97 % -  12/06/13 1230 99/60 mmHg - - 76 17 95 % -  12/06/13 1215 104/68 mmHg - - 78 14 96 % -  12/06/13 1200 105/66 mmHg - - 72 17 96 % -  12/06/13 1100 102/69 mmHg - - 75 15 95 % -  12/06/13 1000 100/73 mmHg - - 80 20 95 % -  12/06/13 0900 112/80 mmHg - - 81 22 95 % -  12/06/13 0837 - 99.5 F (37.5 C) - 77 19 96 % -  12/06/13 0810 - 99.3 F (37.4 C) - 79 18 100 % -  12/06/13 0807 - 99.3 F (37.4 C) - 80 23 100 % -  12/06/13 0800 108/75 mmHg 99.3 F (37.4 C) Core 79 20 99 % -   Current Weight  12/07/13 264 lb 5.3 oz (119.9 kg)  BASELINE WEIGHT: 114 kg    Intake/Output from previous day: 09/17 0701 - 09/18 0700 In: 686.1 [P.O.:480; I.V.:156.1; IV Piggyback:50] Out: 1435 [UDJSH:7026; Chest Tube:140]    PHYSICAL EXAM:  Heart: RRR Lungs: Diminished BS in bases Wound: Clean and dry Extremities: No significant edema   Lab Results: CBC: Recent Labs  12/06/13 1846 12/07/13 0402  WBC 12.3* 12.3*  HGB 10.5* 9.6*  HCT 32.0* 29.5*  PLT 134* 123*   BMET:  Recent Labs  12/06/13 0400 12/06/13 1846 12/07/13 0402  NA 137  --  138  K 4.6  --  4.6  CL 102  --  101  CO2 23  --  25  GLUCOSE 129*  --  129*  BUN 16  --  23  CREATININE 0.97 1.24 1.18  CALCIUM 8.2*  --   8.5    PT/INR:  Recent Labs  12/05/13 1400  LABPROT 16.3*  INR 1.31      Assessment/Plan: S/P Procedure(s) (LRB): AORTIC VALVE REPLACEMENT (AVR) (N/A) INTRAOPERATIVE TRANSESOPHAGEAL ECHOCARDIOGRAM (N/A) PATENT FORAMEN OVALE CLOSURE (N/A)  CV- SR, BPs stable.  Continue current meds.  Endo- sugars stable. A1C=5.4. Off Levemir, will d/c CBGs.  GI- Rx LOC today.  Mild vol overload- diurese.  CRPI, pulm toilet.  Disp- possibly home Sun/Mon if continues to progress.   LOS: 2 days    Okie Bogacz H 12/07/2013

## 2013-12-07 NOTE — Progress Notes (Signed)
CARDIAC REHAB PHASE I   PRE:  Rate/Rhythm: 74 SR    BP: sitting 114/66    SaO2: 91 RA  MODE:  Ambulation: 400 ft   POST:  Rate/Rhythm: 85 SR    BP: sitting 126/76     SaO2: 92 RA  Tolerated well. No c/o until the end of walk when he got nauseated and diaphoretic. This resolved after 3-4 min of rest in recliner with cold cloth. Discussed ed with pt, interested in CRPII and will send referral to [facility].  Encouraged more IS use. [phone]  Josephina Shih Latta CES, ACSM 12/07/2013 3:19 PM

## 2013-12-07 NOTE — Discharge Instructions (Signed)
Aortic Valve Replacement, Care After °Refer to this sheet in the next few weeks. These instructions provide you with information on caring for yourself after your procedure. Your health care provider may also give you specific instructions. Your treatment has been planned according to current medical practices, but problems sometimes occur. Call your health care provider if you have any problems or questions after your procedure. °HOME CARE INSTRUCTIONS  °· Take medicines only as directed by your health care provider. °· If your health care provider has prescribed elastic stockings, wear them as directed. °· Take frequent naps or rest often throughout the day. °· Avoid lifting over 10 lbs (4.5 kg) or pushing or pulling things with your arms for 6-8 weeks or as directed by your health care provider. °· Avoid driving or airplane travel for 4-6 weeks after surgery or as directed by your health care provider. If you are riding in a car for an extended period, stop every 1-2 hours to stretch your legs. Keep a record of your medicines and medical history with you when traveling. °· Do not drive or operate heavy machinery while taking pain medicine. (narcotics). °· Do not cross your legs. °· Do not use any tobacco products including cigarettes, chewing tobacco, or electronic cigarettes. If you need help quitting, ask your health care provider. °· Do not take baths, swim, or use a hot tub until your health care provider approves. Take showers once your health care provider approves. Pat incisions dry. Do not rub incisions with a washcloth or towel. °· Avoid climbing stairs and using the handrail to pull yourself up for the first 2-3 weeks after surgery. °· Return to work as directed by your health care provider. °· Drink enough fluid to keep your urine clear or pale yellow. °· Do not strain to have a bowel movement. Eat high-fiber foods if you become constipated. You may also take a medicine to help you have a bowel  movement (laxative) as directed by your health care provider. °· Resume sexual activity as directed by your health care provider. Men should not use medicines for erectile dysfunction until their doctor says it is okay. °· If you had a certain type of heart condition in the past, you may need to take antibiotic medicine before having dental work or surgery. Let your dentist and health care providers know if you had one or more of the following: °¨ Previous endocarditis. °¨ An artificial (prosthetic) heart valve. °¨ Congenital heart disease. °SEEK MEDICAL CARE IF: °· You develop a skin rash.   °· You experience sudden changes in your weight. °· You have a fever. °SEEK IMMEDIATE MEDICAL CARE IF:  °· You develop chest pain that is not coming from your incision. °· You have drainage (pus), redness, swelling, or pain at your incision site.   °· You develop shortness of breath or have difficulty breathing.   °· You have increased bleeding from your incision site.   °· You develop light-headedness.   °MAKE SURE YOU:  °· Understand these directions. °· Will watch your condition. °· Will get help right away if you are not doing well or get worse. °Document Released: 09/24/2004 Document Revised: 07/23/2013 Document Reviewed: 12/21/2011 °ExitCare® Patient Information ©2015 [employer]. This information is not intended to replace advice given to you by your health care provider. Make sure you discuss any questions you have with your health care provider. ° °

## 2013-12-08 ENCOUNTER — Inpatient Hospital Stay (HOSPITAL_COMMUNITY): Payer: 59

## 2013-12-08 MED ORDER — INFLUENZA VAC SPLIT QUAD 0.5 ML IM SUSY
0.5000 mL | PREFILLED_SYRINGE | INTRAMUSCULAR | Status: AC
  Administered 2013-12-09: 0.5 mL via INTRAMUSCULAR
  Filled 2013-12-08: qty 0.5

## 2013-12-08 NOTE — Progress Notes (Signed)
CARDIAC REHAB PHASE I   PRE:  Rate/Rhythm: 87 SR  BP:  Supine:  Sitting: 106/69  Standing:    SaO2: 93 RA  MODE:  Ambulation: 1000 + ft   POST:  Rate/Rhythm: 89 SR  BP:  Supine:   Sitting: 123/82  Standing:    SaO2: 89-90 % RA  IS encouraged hourly Tolerates ambulation well independently.  Is for possible discharge tomorrow.  Is walking 1000+ feet easily.  Education completed on Friday, no further questions from patient.   N[place] RN, BSN 12/08/2013 1:45 PM

## 2013-12-08 NOTE — Progress Notes (Signed)
Dc'ed pacing wires pt tolerated well 

## 2013-12-08 NOTE — Progress Notes (Addendum)
       [address]       [place] [address]             [phone]          3 Days Post-Op Procedure(s) (LRB): AORTIC VALVE REPLACEMENT (AVR) (N/A) INTRAOPERATIVE TRANSESOPHAGEAL ECHOCARDIOGRAM (N/A) PATENT FORAMEN OVALE CLOSURE (N/A)  Subjective: Feels well, no complaints.   Objective: Vital signs in last 24 hours: Patient Vitals for the past 24 hrs:  BP Temp Temp src Pulse Resp SpO2 Weight  12/08/13 0420 106/69 mmHg 98.2 F (36.8 C) Oral 80 21 91 % 263 lb (119.296 kg)  12/07/13 2028 130/74 mmHg 98.6 F (37 C) Oral 81 19 95 % -  12/07/13 1700 128/86 mmHg 98 F (36.7 C) - 80 20 96 % -   Current Weight  12/08/13 263 lb (119.296 kg)  BASELINE WEIGHT: 114 kg    Intake/Output from previous day: 09/18 0701 - 09/19 0700 In: 480 [P.O.:480] Out: 600 [Urine:600]    PHYSICAL EXAM:  Heart: RRR Lungs: Clear Wound: Clean and dry Extremities: Mild LE edema    Lab Results: CBC: Recent Labs  12/06/13 1846 12/07/13 0402  WBC 12.3* 12.3*  HGB 10.5* 9.6*  HCT 32.0* 29.5*  PLT 134* 123*   BMET:  Recent Labs  12/06/13 0400 12/06/13 1846 12/07/13 0402  NA 137  --  138  K 4.6  --  4.6  CL 102  --  101  CO2 23  --  25  GLUCOSE 129*  --  129*  BUN 16  --  23  CREATININE 0.97 1.24 1.18  CALCIUM 8.2*  --  8.5    PT/INR:  Recent Labs  12/05/13 1400  LABPROT 16.3*  INR 1.31      Assessment/Plan: S/P Procedure(s) (LRB): AORTIC VALVE REPLACEMENT (AVR) (N/A) INTRAOPERATIVE TRANSESOPHAGEAL ECHOCARDIOGRAM (N/A) PATENT FORAMEN OVALE CLOSURE (N/A) CV- SR, BPs stable. Continue current meds.  Mild vol overload- diurese.  CRPI, pulm toilet.  Disp- possibly home Sun/Mon if continues to progress.    LOS: 3 days    COLLINS,GINA H 12/08/2013  Patient seen and examined, agree with above

## 2013-12-09 MED ORDER — TRAMADOL HCL 50 MG PO TABS: 50.0000 mg | ORAL_TABLET | ORAL | Status: DC | PRN

## 2013-12-09 MED ORDER — FUROSEMIDE 40 MG PO TABS: 40.0000 mg | ORAL_TABLET | Freq: Every day | ORAL | Status: DC

## 2013-12-09 MED ORDER — POTASSIUM CHLORIDE CRYS ER 20 MEQ PO TBCR: 20.0000 meq | EXTENDED_RELEASE_TABLET | Freq: Every day | ORAL | Status: DC

## 2013-12-09 MED ORDER — METOPROLOL TARTRATE 12.5 MG HALF TABLET: 12.5000 mg | ORAL_TABLET | Freq: Two times a day (BID) | ORAL | Status: DC

## 2013-12-09 MED ORDER — ASPIRIN 325 MG PO TBEC: 325.0000 mg | DELAYED_RELEASE_TABLET | Freq: Every day | ORAL | Status: DC

## 2013-12-09 NOTE — Progress Notes (Signed)
       [address]       [place] [address]             [phone]          4 Days Post-Op Procedure(s) (LRB): AORTIC VALVE REPLACEMENT (AVR) (N/A) INTRAOPERATIVE TRANSESOPHAGEAL ECHOCARDIOGRAM (N/A) PATENT FORAMEN OVALE CLOSURE (N/A)  Subjective: Feels well, no complaints.   Objective: Vital signs in last 24 hours: Patient Vitals for the past 24 hrs:  BP Temp Temp src Pulse Resp SpO2 Weight  12/09/13 0420 107/65 mmHg 98.8 F (37.1 C) Oral 89 20 92 % 263 lb 4.8 oz (119.432 kg)  12/08/13 1954 104/61 mmHg 99.1 F (37.3 C) Oral 88 20 94 % -  12/08/13 1700 113/53 mmHg - - 80 20 97 % -  12/08/13 1431 120/74 mmHg - - 68 20 99 % -  12/08/13 1300 111/65 mmHg - - 78 20 96 % -  12/08/13 1200 105/68 mmHg - - 74 20 97 % -  12/08/13 1100 109/62 mmHg - - 80 20 96 % -  12/08/13 1045 122/78 mmHg - - 72 20 98 % -   Current Weight  12/09/13 263 lb 4.8 oz (119.432 kg)  BASELINE WEIGHT: 114 kg    Intake/Output from previous day: 09/19 0701 - 09/20 0700 In: 240 [P.O.:240] Out: 400 [Urine:400]    PHYSICAL EXAM:  Heart: RRR Lungs: Clear Wound: Clean and dry Extremities: Mild LE edema    Lab Results: CBC: Recent Labs  12/06/13 1846 12/07/13 0402  WBC 12.3* 12.3*  HGB 10.5* 9.6*  HCT 32.0* 29.5*  PLT 134* 123*   BMET:  Recent Labs  12/06/13 1846 12/07/13 0402  NA  --  138  K  --  4.6  CL  --  101  CO2  --  25  GLUCOSE  --  129*  BUN  --  23  CREATININE 1.24 1.18  CALCIUM  --  8.5    PT/INR: No results found for this basename: LABPROT, INR,  in the last 72 hours    Assessment/Plan: S/P Procedure(s) (LRB): AORTIC VALVE REPLACEMENT (AVR) (N/A) INTRAOPERATIVE TRANSESOPHAGEAL ECHOCARDIOGRAM (N/A) PATENT FORAMEN OVALE CLOSURE (N/A) Progressing well. Plan d/c home today- instructions reviewed with patient.   LOS: 4 days    COLLINS,GINA H 12/09/2013

## 2013-12-09 NOTE — Progress Notes (Signed)
Discharged to home with family office visits in place teaching done / pt. Watched discharge video responded well to discharge teaching

## 2013-12-10 MED FILL — Heparin Sodium (Porcine) Inj 1000 Unit/ML: INTRAMUSCULAR | Qty: 30 | Status: AC

## 2013-12-10 MED FILL — Magnesium Sulfate Inj 50%: INTRAMUSCULAR | Qty: 10 | Status: AC

## 2013-12-10 MED FILL — Potassium Chloride Inj 2 mEq/ML: INTRAVENOUS | Qty: 40 | Status: AC

## 2013-12-26 ENCOUNTER — Encounter: Payer: 59 | Admitting: Physician Assistant

## 2013-12-27 ENCOUNTER — Encounter: Payer: Self-pay | Admitting: Gastroenterology

## 2013-12-27 ENCOUNTER — Ambulatory Visit: Payer: 59 | Admitting: Gastroenterology

## 2013-12-27 ENCOUNTER — Ambulatory Visit (INDEPENDENT_AMBULATORY_CARE_PROVIDER_SITE_OTHER): Payer: 59 | Admitting: Gastroenterology

## 2013-12-27 VITALS — BP 120/90 | HR 80 | Ht 71.0 in | Wt 241.2 lb

## 2013-12-27 DIAGNOSIS — Z808 Family history of malignant neoplasm of other organs or systems: Secondary | ICD-10-CM

## 2013-12-27 DIAGNOSIS — Z809 Family history of malignant neoplasm, unspecified: Secondary | ICD-10-CM

## 2013-12-27 DIAGNOSIS — R1013 Epigastric pain: Secondary | ICD-10-CM

## 2013-12-27 MED ORDER — HYOSCYAMINE SULFATE ER 0.375 MG PO TBCR: EXTENDED_RELEASE_TABLET | ORAL | Status: DC

## 2013-12-27 NOTE — Assessment & Plan Note (Signed)
Plan screening colonoscopy in several months once patient has recovered from aortic valve surgery

## 2013-12-27 NOTE — Progress Notes (Signed)
_                                                                                                                History of Present Illness:  Mr. Cardell is a pleasant 58 year old white male, status post aVR one month ago,  referred for evaluation of abdominal discomfort.  Since surgery he's been complaining of a sour stomach with frequent crampy upper abdominal discomfort.  He is off narcotics and is moving his bowels more regularly.  He denies nausea or pyrosis.  Family history pertinent for father and paternal grandmother who had colon cancer in their early 20s.   Past Medical History  Diagnosis Date  . Hypertension   . Bicuspid aortic valve   . Aortic stenosis 07/24/2013  . Coronary artery disease   . Heart murmur   . S/P aortic valve replacement with bioprosthetic valve 12/05/2013    25 mm [facility] Ease bovine pericardial tissue valve   . Patent foramen ovale 10/23/2013    Closed at the time of aortic valve replacement   Past Surgical History  Procedure Laterality Date  . Vasectomy  06/2002  . Hand surgery Left 07/1977  . Tee without cardioversion N/A 10/23/2013    Procedure: TRANSESOPHAGEAL ECHOCARDIOGRAM (TEE);  Surgeon: Larey Dresser, MD;  Location: [facility];  Service: Cardiovascular;  Laterality: N/A;  . Aortic valve replacement N/A 12/05/2013    Procedure: AORTIC VALVE REPLACEMENT (AVR);  Surgeon: [name], MD;  Location: [facility];  Service: Open Heart Surgery;  Laterality: N/A;  . Intraoperative transesophageal echocardiogram N/A 12/05/2013    Procedure: INTRAOPERATIVE TRANSESOPHAGEAL ECHOCARDIOGRAM;  Surgeon: [name], MD;  Location: [facility];  Service: Open Heart Surgery;  Laterality: N/A;  . Patent foramen ovale closure N/A 12/05/2013    Procedure: PATENT FORAMEN OVALE CLOSURE;  Surgeon: [name], MD;  Location: [facility];  Service: Open Heart Surgery;  Laterality: N/A;   family history includes Cancer in his daughter, father, maternal  grandfather, maternal grandmother, mother, paternal grandfather, and paternal grandmother; Diabetes in his father, maternal grandfather, maternal grandmother, mother, paternal grandfather, and paternal grandmother; Heart attack in his maternal grandmother; Hypertension in his maternal grandmother; Stroke in his paternal grandfather. Current Outpatient Prescriptions  Medication Sig Dispense Refill  . aspirin EC 325 MG EC tablet Take 1 tablet (325 mg total) by mouth daily.  30 tablet  0  . metoprolol tartrate (LOPRESSOR) 12.5 mg TABS tablet Take 0.5 tablets (12.5 mg total) by mouth 2 (two) times daily.  30 tablet  2  . Multiple Vitamin (MULTIVITAMIN WITH MINERALS) TABS tablet Take 1 tablet by mouth daily.      . Omega-3 Fatty Acids (FISH OIL) 1000 MG CAPS Take 3,000 mg by mouth daily.        No current facility-administered medications for this visit.   Allergies as of 12/27/2013  . (No Known Allergies)    reports that he has never smoked. He has never used smokeless tobacco. He reports that he  drinks alcohol. He reports that he does not use illicit drugs.   Review of Systems: Pertinent positive and negative review of systems were noted in the above HPI section. All other review of systems were otherwise negative.  Vital signs were reviewed in today's medical record Physical Exam: General: Well developed , well nourished, no acute distress Skin: anicteric Head: Normocephalic and atraumatic Eyes:  sclerae anicteric, EOMI Ears: Normal auditory acuity Mouth: No deformity or lesions Neck: Supple, no masses or thyromegaly Lungs: Clear throughout to auscultation Heart: Regular rate and rhythm; no murmurs, rubs or bruits Abdomen: Soft, non tender and non distended. No masses, hepatosplenomegaly or hernias noted. Normal Bowel sounds Rectal:deferred Musculoskeletal: Symmetrical with no gross deformities  Skin: No lesions on visible extremities Pulses:  Normal pulses noted Extremities: No  clubbing, cyanosis, edema or deformities noted Neurological: Alert oriented x 4, grossly nonfocal Cervical Nodes:  No significant cervical adenopathy Inguinal Nodes: No significant inguinal adenopathy Psychological:  Alert and cooperative. Normal mood and affect  See Assessment and Plan under Problem List

## 2013-12-27 NOTE — Patient Instructions (Signed)
It has been recommended to you by your physician that you have a(n) Colonoscopy completed.in January.    Please contact our office at [phone] should you decide to have the procedure completed.

## 2013-12-27 NOTE — Assessment & Plan Note (Signed)
Patient has nonspecific, crampy upper abdominal pain.    Recommendations #1 hyomax when necessary

## 2014-01-02 ENCOUNTER — Encounter: Payer: Self-pay | Admitting: *Deleted

## 2014-01-03 ENCOUNTER — Other Ambulatory Visit: Payer: Self-pay | Admitting: Thoracic Surgery (Cardiothoracic Vascular Surgery)

## 2014-01-03 DIAGNOSIS — I350 Nonrheumatic aortic (valve) stenosis: Secondary | ICD-10-CM

## 2014-01-07 ENCOUNTER — Encounter: Payer: Self-pay | Admitting: *Deleted

## 2014-01-07 ENCOUNTER — Ambulatory Visit (INDEPENDENT_AMBULATORY_CARE_PROVIDER_SITE_OTHER): Payer: 59 | Admitting: Physician Assistant

## 2014-01-07 ENCOUNTER — Ambulatory Visit
Admission: RE | Admit: 2014-01-07 | Discharge: 2014-01-07 | Disposition: A | Discharge status: Home / Self Care | Payer: 59 | Source: Ambulatory Visit | Attending: Thoracic Surgery (Cardiothoracic Vascular Surgery) | Admitting: Thoracic Surgery (Cardiothoracic Vascular Surgery)

## 2014-01-07 ENCOUNTER — Encounter: Payer: Self-pay | Admitting: Thoracic Surgery (Cardiothoracic Vascular Surgery)

## 2014-01-07 ENCOUNTER — Ambulatory Visit (INDEPENDENT_AMBULATORY_CARE_PROVIDER_SITE_OTHER): Payer: Self-pay | Admitting: Thoracic Surgery (Cardiothoracic Vascular Surgery)

## 2014-01-07 ENCOUNTER — Encounter: Payer: Self-pay | Admitting: Physician Assistant

## 2014-01-07 VITALS — BP 133/94 | HR 96 | Ht 71.0 in | Wt 241.0 lb

## 2014-01-07 VITALS — BP 130/90 | HR 94 | Ht 71.0 in | Wt 245.0 lb

## 2014-01-07 DIAGNOSIS — Z954 Presence of other heart-valve replacement: Secondary | ICD-10-CM

## 2014-01-07 DIAGNOSIS — Z953 Presence of xenogenic heart valve: Secondary | ICD-10-CM

## 2014-01-07 DIAGNOSIS — I10 Essential (primary) hypertension: Secondary | ICD-10-CM

## 2014-01-07 DIAGNOSIS — Z0271 Encounter for disability determination: Secondary | ICD-10-CM

## 2014-01-07 DIAGNOSIS — Q2112 Patent foramen ovale: Secondary | ICD-10-CM

## 2014-01-07 DIAGNOSIS — I350 Nonrheumatic aortic (valve) stenosis: Secondary | ICD-10-CM

## 2014-01-07 DIAGNOSIS — Q211 Atrial septal defect: Secondary | ICD-10-CM

## 2014-01-07 DIAGNOSIS — Q231 Congenital insufficiency of aortic valve: Secondary | ICD-10-CM

## 2014-01-07 MED ORDER — METOPROLOL TARTRATE 12.5 MG HALF TABLET: 25.0000 mg | ORAL_TABLET | Freq: Two times a day (BID) | ORAL | Status: DC

## 2014-01-07 NOTE — Progress Notes (Signed)
[address]       [place] [address]             [facility] OFFICE NOTE  Referring Provider is [name], MD PCP is Aundria Mems, MD   HPI:  Patient returns to the office today for routine followup status post aortic valve replacement using a bioprosthetic tissue valve and closure of patent foramen ovale on 12/05/2013. His postoperative recovery has been uncomplicated. Since hospital discharge she has continued to do well. He states that he initially had some problems with abdominal discomfort. He was prescribed and anti-spasmodic which alleviated his symptoms. Since then he has done well. He states that he is now eating well. He has minimal residual soreness in his chest. He has no shortness of breath. He is walking every day. He has not participating in outpatient cardiac rehabilitation program. He is scheduled for routine followup at Dr. Antionette Char office later today.  Current Outpatient Prescriptions  Medication Sig Dispense Refill  . aspirin EC 325 MG EC tablet Take 1 tablet (325 mg total) by mouth daily.  30 tablet  0  . Hyoscyamine Sulfate 0.375 MG TBCR Take one tab twice a day for 5 days, then as needed, abdominal pain  25 tablet  1  . metoprolol tartrate (LOPRESSOR) 12.5 mg TABS tablet Take 0.5 tablets (12.5 mg total) by mouth 2 (two) times daily.  30 tablet  2  . Multiple Vitamin (MULTIVITAMIN WITH MINERALS) TABS tablet Take 1 tablet by mouth daily.      . Omega-3 Fatty Acids (FISH OIL) 1000 MG CAPS Take 3,000 mg by mouth daily.        No current facility-administered medications for this visit.      Physical Exam:   BP 133/94  Pulse 96  Ht 5\' 11"  (1.803 m)  Wt 241 lb (109.317 kg)  BMI 33.63 kg/m2  SpO2 97%  General:  Well-appearing  Chest:   Clear  CV:   Regular rate and rhythm without murmur  Incisions:  Clean and dry, healing nicely. Sternum is stable  Abdomen:  Soft nontender  Extremities:  Warm  and well-perfused  Diagnostic Tests:  CHEST - 2 VIEW  COMPARISON: 12/08/2013  FINDINGS:  Postop changes from aortic valve replacement. Median sternotomy  wires appear intact. Mild cardiomegaly without edema, collapse,  pneumonia, effusion, or pneumothorax. Trachea midline. Previous  small left effusion has resolved.  IMPRESSION:  Mild cardiomegaly without acute process.  Resolved small left effusion.  Electronically Signed  By: Daryll Brod M.D.  On: 01/07/2014 14:04    Impression:  Patient is doing well approximately one month status post aortic valve replacement using a bioprosthetic tissue valve.  Plan:  I have instructed the patient to increase his dose of metoprolol 25 mg twice daily. At some point he may need to resume taking lisinopril. I've encouraged patient to enroll in the outpatient cardiac rehabilitation program, but he plans to continue physical rehabilitation on his own. We have discussed a manner by which she should proceed. We've also discussed return to work. Because his work does not require any strenuous physical exertion I think it is possible that he could return to work in 2-3 weeks, particularly if he could begin her limited basis and gradually increase his hours.  The patient has been reminded to refrain from any heavy lifting or strenuous use of his arms or shoulders for at least another 2 months. The patient will  return in 2 months for routine followup.   Valentina Gu. Roxy Manns, MD 01/07/2014 2:30 PM

## 2014-01-07 NOTE — Progress Notes (Signed)
Cardiology Office Note   Date:  01/07/2014   ID:  Patrick Walter, DOB 03-16-1956, MRN [id number]  PCP:  Aundria Mems, MD  Cardiologist:  Dr. [name]     History of Present Illness: Patrick Walter is a 58 y.o. male with a history of HTN and aortic stenosis. He was recently noted to have progression to severe aortic stenosis. TEE demonstrated a bicuspid aortic valve with severe aortic stenosis. Cardiac catheterization demonstrated no significant CAD. He was referred for surgical correction. He was admitted 9/16-9/20 and underwent a bioprosthetic AVR with Dr. Roxy Manns. He also underwent closure of a patent foramen ovale. Postoperative course was fairly uneventful. He maintained NSR. He returns for follow up.  He has already seen Dr. Roxy Manns today in follow up. His metoprolol was increased to 25 mg twice a day.  The patient feels well. He denies significant chest soreness. He denies significant dyspnea. He denies orthopnea, PND or edema. He denies syncope. He is walking on his own. He prefers to not pursue formal cardiac rehabilitation.  Studies:  - LHC (8/15):  Proximal LAD 20-30%, RCA with minor irregularities  - Echo (6/15):  Moderate LVH, EF 93-79%, grade 1 diastolic dysfunction, moderate to severe aortic stenosis (mean 36 mm Hg), MAC, mild MR, mild LAE  - TEE (8/15): EF 60-65%, mild LVH, normal wall motion, bicuspid aortic valve with severe stenosis (mean 68 mm Hg), aortic root and ascending aorta dilated to 4 cm  - Carotid US (9/15):  Bilateral ICA < 39%   Recent Labs/Images:  Recent Labs  08/31/13 0852  12/03/13 1344  12/07/13 0402  NA 141  < > 137  < > 138  K 4.8  < > 4.2  < > 4.6  BUN 17  < > 29*  < > 23  CREATININE 1.07  < > 1.15  < > 1.18  ALT 18  --  21  --   --   HGB 14.1  < > 13.9  < > 9.6*  TSH 2.951  --   --   --   --   LDLCALC 123*  --   --   --   --   HDL 45  --   --   --   --   < > = values in this interval not displayed.   Dg Chest 2 View    01/07/2014  IMPRESSION: Mild cardiomegaly without acute process.  Resolved small left effusion.   Electronically Signed   By: Daryll Brod M.D.   On: 01/07/2014 14:04     Wt Readings from Last 3 Encounters:  01/07/14 245 lb (111.131 kg)  01/07/14 241 lb (109.317 kg)  12/27/13 241 lb 3.2 oz (109.408 kg)     Past Medical History  Diagnosis Date  . Hypertension   . Bicuspid aortic valve   . Aortic stenosis 07/24/2013  . Coronary artery disease   . Heart murmur   . S/P aortic valve replacement with bioprosthetic valve 12/05/2013    25 mm [facility] Ease bovine pericardial tissue valve   . Patent foramen ovale 10/23/2013    Closed at the time of aortic valve replacement    Current Outpatient Prescriptions  Medication Sig Dispense Refill  . aspirin EC 325 MG EC tablet Take 1 tablet (325 mg total) by mouth daily.  30 tablet  0  . Hyoscyamine Sulfate 0.375 MG TBCR Take one tab twice a day for 5 days, then as needed, abdominal pain  25 tablet  1  . metoprolol tartrate (LOPRESSOR) 25 MG tablet Take 25 mg by mouth 2 (two) times daily.      . Multiple Vitamin (MULTIVITAMIN WITH MINERALS) TABS tablet Take 1 tablet by mouth daily.      . Omega-3 Fatty Acids (FISH OIL) 1000 MG CAPS Take 3,000 mg by mouth daily.        No current facility-administered medications for this visit.     Allergies:   Review of patient's allergies indicates no known allergies.   Social History:  The patient  reports that he has never smoked. He has never used smokeless tobacco. He reports that he drinks alcohol. He reports that he does not use illicit drugs.   Family History:  The patient's family history includes Cancer in his daughter, father, maternal grandfather, maternal grandmother, mother, paternal grandfather, and paternal grandmother; Diabetes in his father, maternal grandfather, maternal grandmother, mother, paternal grandfather, and paternal grandmother; Heart attack in his maternal grandmother;  Hypertension in his maternal grandmother; Stroke in his paternal grandfather.   ROS:  Please see the history of present illness.       All other systems reviewed and negative.    PHYSICAL EXAM: VS:  BP 130/90  Pulse 94  Ht 5\' 11"  (1.803 m)  Wt 245 lb (111.131 kg)  BMI 34.19 kg/m2 Well nourished, well developed, in no acute distress HEENT: normal Neck:  no JVD Cardiac:  normal S1, S2;  RRR; no murmur Lungs:   clear to auscultation bilaterally, no wheezing, rhonchi or rales Abd: soft, nontender, no hepatomegaly Ext:  no edema Skin: warm and dry Neuro:  CNs 2-12 intact, no focal abnormalities noted  EKG:  NSR, HR 94, rightward axis, nonspecific ST-T wave changes, interventricular conduction delay      ASSESSMENT AND PLAN:  1.  Bicuspid aortic valve with Aortic stenosis S/P aortic valve replacement with bioprosthetic valve:  He is progressing well after recent aortic valve replacement. He does not wish to pursue formal cardiac rehabilitation. I asked him to contact our office if he changes his mind. He understands SBE prophylaxis. I will arrange a follow up echocardiogram. 2.  Patent foramen ovale:  S/p closure.  3.  Essential hypertension, benign:  Borderline control.  Metoprolol increased today.  Continue to monitor. If BP remains elevated, he will need to resume Lisinopril.    Disposition:   FU with Dr. [name] 2 mos.   Signed, Versie Starks, MHS 01/07/2014 3:35 PM    [facility] [place], [place], [place]  [address] Phone: [phone]; Fax: [phone]

## 2014-01-07 NOTE — Patient Instructions (Signed)
Your physician has requested that you have an echocardiogram. Echocardiography is a painless test that uses sound waves to create images of your heart. It provides your doctor with information about the size and shape of your heart and how well your heart's chambers and valves are working. This procedure takes approximately one hour. There are no restrictions for this procedure.  Your physician recommends that you schedule a follow-up appointment in: 2 [place]  Your physician recommends that you continue on your current medications as directed. Please refer to the Current Medication list given to you today.

## 2014-01-07 NOTE — Patient Instructions (Addendum)
The patient should continue to avoid any heavy lifting or strenuous use of arms or shoulders for at least a total of three months from the time of surgery.  The patient is encouraged to enroll and participate in the outpatient cardiac rehab program beginning as soon as practical.  The patient may return to driving an automobile as long as they are no longer requiring oral narcotic pain relievers during the daytime.  It would be wise to start driving only short distances during the daylight and gradually increase from there as they feel comfortable.  The patient may return to work in 2 weeks as long as he can adhere to specific physical limitations including no lifting in excess of 10 pounds, no squatting, no strenuous use of arms or shoulders. He may need to return to work half time initially because of fatigue. By 03/06/2014 he should be back to unrestricted physical activity and full-time work.  Patient has been instructed to increase his dose of metoprolol 25 mg twice daily.

## 2014-01-08 ENCOUNTER — Encounter: Payer: Self-pay | Admitting: *Deleted

## 2014-01-16 ENCOUNTER — Ambulatory Visit (HOSPITAL_COMMUNITY): Payer: 59 | Attending: Physician Assistant | Admitting: Cardiology

## 2014-01-16 ENCOUNTER — Encounter: Payer: Self-pay | Admitting: Physician Assistant

## 2014-01-16 DIAGNOSIS — I359 Nonrheumatic aortic valve disorder, unspecified: Secondary | ICD-10-CM | POA: Insufficient documentation

## 2014-01-16 DIAGNOSIS — Z953 Presence of xenogenic heart valve: Secondary | ICD-10-CM

## 2014-01-16 DIAGNOSIS — I10 Essential (primary) hypertension: Secondary | ICD-10-CM | POA: Diagnosis not present

## 2014-01-16 DIAGNOSIS — Z954 Presence of other heart-valve replacement: Secondary | ICD-10-CM | POA: Diagnosis not present

## 2014-01-16 DIAGNOSIS — Q231 Congenital insufficiency of aortic valve: Secondary | ICD-10-CM

## 2014-01-16 DIAGNOSIS — I350 Nonrheumatic aortic (valve) stenosis: Secondary | ICD-10-CM

## 2014-01-16 NOTE — Progress Notes (Signed)
Echo performed. 

## 2014-01-28 ENCOUNTER — Telehealth: Payer: Self-pay | Admitting: *Deleted

## 2014-01-28 MED ORDER — AMOXICILLIN 500 MG PO TABS: ORAL_TABLET | ORAL | Status: DC

## 2014-01-28 NOTE — Telephone Encounter (Signed)
I spoke with the pt and made him aware Rx sent to the pharmacy.

## 2014-01-28 NOTE — Telephone Encounter (Signed)
Patient stated that he has a dental appointment this Thursday and would like an antibiotic sent to [facility]. Please advise. Thanks, MI

## 2014-01-29 ENCOUNTER — Telehealth: Payer: Self-pay | Admitting: Cardiovascular Disease

## 2014-01-29 ENCOUNTER — Encounter: Payer: Self-pay | Admitting: Gastroenterology

## 2014-01-29 NOTE — Telephone Encounter (Signed)
New Message  Called requesting Documentation indicating the pt will or will not need to be premedicated before procedure. Please call back to discuss further

## 2014-01-29 NOTE — Telephone Encounter (Signed)
Per Dr Burt Knack the pt does require premedications prior to dental appointment. The pt has already been given Rx for Amoxicillin. I attempted to call [facility] Fe[facility] but the office is closed.

## 2014-02-01 NOTE — Telephone Encounter (Signed)
I attempted to reach [facility] Feil but the office is closed on Friday.

## 2014-02-04 NOTE — Telephone Encounter (Signed)
Left message for Patrick Walter to call our office.

## 2014-02-05 NOTE — Telephone Encounter (Signed)
I spoke with Patrick Walter and she would like documentation from our office about the pt's SBE regimen. Pt should take Amoxicillin 500mg  take 4 tablets one hour prior to dental procedure. The pt already has this prescription. I will fax this office note to [phone].

## 2014-02-08 ENCOUNTER — Encounter: Payer: Self-pay | Admitting: Physician Assistant

## 2014-02-08 NOTE — Progress Notes (Signed)
      [address]       [place] [address]             [phone]     DISCHARGE SUMMARY - ADDENDUM   Discharge medications were inadvertently omitted from the discharge summary dated 12/07/2013.  These should read as follows:     Medication List       This list is accurate as of: 02/08/14  8:03 AM.  Always use your most recent med list.               amoxicillin 500 MG tablet  Commonly known as:  AMOXIL  Take 4 tablets one hour prior to dental procedure     aspirin 325 MG EC tablet  Take 1 tablet (325 mg total) by mouth daily.     Fish Oil 1000 MG Caps  Take 3,000 mg by mouth daily.     Hyoscyamine Sulfate 0.375 MG Tbcr  Take one tab twice a day for 5 days, then as needed, abdominal pain     metoprolol tartrate 25 MG tablet  Commonly known as:  LOPRESSOR  Take 25 mg by mouth 2 (two) times daily.     multivitamin with minerals Tabs tablet  Take 1 tablet by mouth daily.

## 2014-02-18 ENCOUNTER — Ambulatory Visit (INDEPENDENT_AMBULATORY_CARE_PROVIDER_SITE_OTHER): Payer: 59 | Admitting: Cardiovascular Disease

## 2014-02-18 ENCOUNTER — Encounter: Payer: Self-pay | Admitting: Cardiovascular Disease

## 2014-02-18 VITALS — BP 160/102 | HR 85 | Wt 244.0 lb

## 2014-02-18 DIAGNOSIS — I10 Essential (primary) hypertension: Secondary | ICD-10-CM

## 2014-02-18 DIAGNOSIS — I359 Nonrheumatic aortic valve disorder, unspecified: Secondary | ICD-10-CM

## 2014-02-18 MED ORDER — LISINOPRIL 10 MG PO TABS: 10.0000 mg | ORAL_TABLET | Freq: Every day | ORAL | Status: DC

## 2014-02-18 NOTE — Patient Instructions (Signed)
Your physician has recommended you make the following change in your medication:  START Lisinopril 10mg  take one by mouth daily  Your physician recommends that you schedule a follow-up appointment in: 3 MONTHS with Dr Burt Knack

## 2014-02-18 NOTE — Progress Notes (Signed)
Background:  The patient is followed for hypertension and bicuspid valve aortic stenosis. He presented with symptomatic aortic stenosis and underwent bioprosthetic aortic valve replacement in September 2015. His postoperative course was uneventful.  HPI:  58 year old gentleman presenting for follow-up evaluation. He is doing well. He is walking over 3 miles every day and has no symptoms with that level of exertion. This is a major change compared to his preoperative functional capacity. He denies chest pain, chest pressure, or shortness of breath. He's had no lightheadedness or syncope. He denies edema or heart palpitations.  Studies:  2-D echocardiogram 01/16/2014: Study Conclusions  - Left ventricle: The cavity size was normal. Wall thickness was increased in a pattern of mild LVH. There was moderate focal basal hypertrophy of the septum. Systolic function was normal. The estimated ejection fraction was in the range of 55% to 60%. Wall motion was normal; there were no regional wall motion abnormalities. Doppler parameters are consistent with abnormal left ventricular relaxation (grade 1 diastolic dysfunction). The E/e&' ratio is between 8-15, suggesting indeterminate LV filling pressure. - Aortic valve: Bioprosthetic aortic valve. Peak and mean gradients of 26 mmHg and 15 mmHg, respectively. AT <100 msec and DVI >25. No evidence for obstruction. - Aorta: Ascending aortic diameter: 44 mm (S). - Ascending aorta: The ascending aorta is dilated. - Mitral valve: Calcified annulus. Mildly thickened leaflets . - Left atrium: Moderately dilated 41 ml/m2.  Impressions:  - Compared to the prior echo in 11/2013, there is now a bioprosthetic aortic valve which is functioning normally without evidence for obstruction or mismatch. The ascending aorta does measure larger at 4.4 cm.  Gated cardiac CTA 11/14/2013: FINDINGS: Aortic Valve: Trileaflet aortic valve with  partially fused right and non-coronary leaflets - functionally bileaflet valve. Severely calcified leaflets with severely restricted leaflet opening.  Aorta: Aortic root, sinotubular junction and ascending thoracic arch are mildly dilated. There is no dissection, no calcifications.  Sinotubular Junction: 34 x 34 mm  Ascending Thoracic Aorta: 40 x 40 mm  Aortic Arch: 33 x 32 mm  Descending Thoracic Aorta: 33 x 32 mm  Sinus of Valsalva Measurements:  Non-coronary: 39 mm  Right -coronary: 37 mm  Left -coronary: 39 mm  Coronary Artery Height above Annulus:  Left Main: 14 mm  Right Coronary: 20 mm  Virtual Basal Annulus Measurements:  Maximum/Minimum Diameter: 34 x 28 mm  Perimeter: 106 mm  Area: 752 mm2  Coronary Arteries: Originating in normal position. Right dominance. No significant plaque.  IMPRESSION: 1. Functionally bicuspid aortic valve with severely calcified leaflets and severely limited leaflet opening. Aortic annular measurements exceed currently available Edward-Sapien XT TAVR.  2. Aortic root, sinotubular junction and ascending thoracic arch are mildly dilated.    Outpatient Encounter Prescriptions as of 02/18/2014  Medication Sig  . amoxicillin (AMOXIL) 500 MG capsule   . aspirin EC 325 MG EC tablet Take 1 tablet (325 mg total) by mouth daily.  Marland Kitchen lisinopril (PRINIVIL,ZESTRIL) 10 MG tablet Take 1 tablet (10 mg total) by mouth daily.  . metoprolol tartrate (LOPRESSOR) 25 MG tablet Take 25 mg by mouth 2 (two) times daily.  . Multiple Vitamin (MULTIVITAMIN WITH MINERALS) TABS tablet Take 1 tablet by mouth daily.  . Omega-3 Fatty Acids (FISH OIL) 1000 MG CAPS Take 3,000 mg by mouth daily.   . [DISCONTINUED] amoxicillin (AMOXIL) 500 MG tablet Take 4 tablets one hour prior to dental procedure  . [DISCONTINUED] Hyoscyamine Sulfate 0.375 MG TBCR Take one tab twice a day for 5  days, then as needed, abdominal pain    No  Known Allergies  Past Medical History  Diagnosis Date  . Hypertension   . Bicuspid aortic valve   . Aortic stenosis 07/24/2013  . Coronary artery disease   . Heart murmur   . S/P aortic valve replacement with bioprosthetic valve 12/05/2013    25 mm [facility] Ease bovine pericardial tissue valve   . Patent foramen ovale 10/23/2013    Closed at the time of aortic valve replacement  . Hx of echocardiogram     post AVR >> Echo (10/15):  Mild LVH, mod focal basal septal hypertrophy, EF 55-60%, Gr 1 DD, AVR ok (mean 15 mmHg), dilated ascending Aorta (44 mm), mod LAE    family history includes Cancer in his daughter, father, maternal grandfather, maternal grandmother, mother, paternal grandfather, and paternal grandmother; Diabetes in his father, maternal grandfather, maternal grandmother, mother, paternal grandfather, and paternal grandmother; Heart attack in his maternal grandmother; Hypertension in his maternal grandmother; Stroke in his paternal grandfather.   ROS: Negative except as per HPI  BP 160/102 mmHg  Pulse 85  Wt 244 lb (110.678 kg)  SpO2 97%  PHYSICAL EXAM: Pt is alert and oriented, NAD Blood pressure on my recheck is 154/100 HEENT: normal Neck: JVP - normal, carotids 2+= without bruits Lungs: CTA bilaterally CV: RRR without murmur or gallop Abd: soft, NT, Positive BS, no hepatomegaly Ext: no C/C/E, distal pulses intact and equal Skin: warm/dry no rash  ASSESSMENT AND PLAN: 1. Severe bicuspid valve aortic stenosis status post bioprosthetic AVR. The patient will follow SBE prophylaxis. He takes aspirin 325 mg daily. He is scheduled to see Dr. Roxy Manns in follow-up December 14.  2. Essential hypertension with suboptimal control. He will continue on metoprolol. I have added lisinopril 10 mg daily. I asked him to check his blood pressure twice weekly and bring in a blood pressure log when he returns in 3 months.  3. Dilated ascending aorta. Preoperative CTA demonstrated an  ascending aorta maximal diameter of 4 cm. By echo, the ascending aorta measured 4.4 cm, but suspect this is related to various between different imaging modalities with the CTA more accurate. Will repeat a gated cardiac CTA in one year.  [name], MD 02/18/2014 4:22 PM

## 2014-02-28 ENCOUNTER — Encounter (HOSPITAL_COMMUNITY): Payer: Self-pay | Admitting: Cardiovascular Disease

## 2014-03-04 ENCOUNTER — Ambulatory Visit (INDEPENDENT_AMBULATORY_CARE_PROVIDER_SITE_OTHER): Payer: 59 | Admitting: Thoracic Surgery (Cardiothoracic Vascular Surgery)

## 2014-03-04 ENCOUNTER — Encounter: Payer: Self-pay | Admitting: Thoracic Surgery (Cardiothoracic Vascular Surgery)

## 2014-03-04 VITALS — BP 135/92 | HR 65 | Resp 16 | Ht 71.0 in | Wt 230.0 lb

## 2014-03-04 DIAGNOSIS — Q2112 Patent foramen ovale: Secondary | ICD-10-CM

## 2014-03-04 DIAGNOSIS — Q231 Congenital insufficiency of aortic valve: Secondary | ICD-10-CM

## 2014-03-04 DIAGNOSIS — I350 Nonrheumatic aortic (valve) stenosis: Secondary | ICD-10-CM

## 2014-03-04 DIAGNOSIS — Z953 Presence of xenogenic heart valve: Secondary | ICD-10-CM

## 2014-03-04 DIAGNOSIS — Q211 Atrial septal defect: Secondary | ICD-10-CM

## 2014-03-04 DIAGNOSIS — Z954 Presence of other heart-valve replacement: Secondary | ICD-10-CM

## 2014-03-04 NOTE — Progress Notes (Signed)
[address]       [place] [address]             [place] OFFICE NOTE  Referring Provider is Blane Ohara, MD PCP is Aundria Mems, MD   HPI:  Patient returns to the office today for routine followup status post aortic valve replacement using a bioprosthetic tissue valve and closure of patent foramen ovale on 12/05/2013. His postoperative recovery was uncomplicated and he was last seen here in our office on 01/07/2014.  Since then he underwent follow-up echocardiogram on 01/16/2014. This revealed normal left ventricular systolic function with ejection fraction estimated 55-60%. There was a normal functioning and bioprosthetic tissue valve in the aortic position with mean transvalvular gradient 15 mmHg.  The patient was seen recently in follow-up by Dr. Burt Knack and he returns to our office for routine follow-up today. He continues to do exceptionally well. He is walking 5 kilometers every day. He states that his exercise tolerance is much better than it was prior to surgery.  He has no complaints and he feels quite well.   Current Outpatient Prescriptions  Medication Sig Dispense Refill  . amoxicillin (AMOXIL) 500 MG capsule     . aspirin EC 325 MG EC tablet Take 1 tablet (325 mg total) by mouth daily. 30 tablet 0  . lisinopril (PRINIVIL,ZESTRIL) 10 MG tablet Take 1 tablet (10 mg total) by mouth daily. 90 tablet 3  . metoprolol tartrate (LOPRESSOR) 25 MG tablet Take 25 mg by mouth 2 (two) times daily.    . Multiple Vitamin (MULTIVITAMIN WITH MINERALS) TABS tablet Take 1 tablet by mouth daily.    . Omega-3 Fatty Acids (FISH OIL) 1000 MG CAPS Take 3,000 mg by mouth daily.      No current facility-administered medications for this visit.      Physical Exam:   BP 135/92 mmHg  Pulse 65  Resp 16  Ht 5\' 11"  (1.803 m)  Wt 230 lb (104.327 kg)  BMI 32.09 kg/m2  SpO2  97%  General:  Well-appearing  Chest:   Clear  CV:   Regular rate and rhythm without murmur  Incisions:  Completely healed, sternum is stable  Abdomen:  Soft and nontender  Extremities:  Warm and well-perfused  Diagnostic Tests:  Transthoracic Echocardiography  Patient:  Dorse, Locy MR #:    [id number] Study Date: 01/16/2014 Gender:   M Age:    58 Height:   180.3 cm Weight:   111.1 kg BSA:    2.4 m^2 Pt. Status: Room:  SONOGRAPHER [place], Will ATTENDING  Kathlen Mody, Scott T Faylene Million, Scott T REFERRING  Kathlen Mody, Scott T PERFORMING  [facility], Outpatient  cc:  ------------------------------------------------------------------- LV EF: 55% -  60%  ------------------------------------------------------------------- Indications:   424.1 Aortic valve disorders (Z95.4).  ------------------------------------------------------------------- History:  PMH: S/p AVR. PFO closure. Acquired from the patient and from the patient&'s chart. Risk factors: Hypertension.  ------------------------------------------------------------------- Study Conclusions  - Left ventricle: The cavity size was normal. Wall thickness was increased in a pattern of mild LVH. There was moderate focal basal hypertrophy of the septum. Systolic function was normal. The estimated ejection fraction was in the range of 55% to 60%. Wall motion was normal; there were no regional wall motion abnormalities. Doppler parameters are consistent with abnormal left ventricular relaxation (grade 1 diastolic dysfunction). The E/e&' ratio is between 8-15, suggesting indeterminate LV filling pressure. - Aortic valve: Bioprosthetic aortic valve. Peak  and mean gradients of 26 mmHg and 15 mmHg, respectively. AT <100 msec and DVI >25. No evidence for obstruction. - Aorta: Ascending aortic diameter: 44 mm (S). - Ascending aorta: The ascending aorta is dilated. -  Mitral valve: Calcified annulus. Mildly thickened leaflets . - Left atrium: Moderately dilated 41 ml/m2.  Impressions:  - Compared to the prior echo in 11/2013, there is now a bioprosthetic aortic valve which is functioning normally without evidence for obstruction or mismatch. The ascending aorta does measure larger at 4.4 cm.  Transthoracic echocardiography. M-mode, complete 2D, spectral Doppler, and color Doppler. Birthdate: Patient birthdate: 06/02/1955. Age: Patient is 58 yr old. Sex: Gender: male. BMI: 34.2 kg/m^2. Blood pressure:   130/90 Patient status: Outpatient. Study date: Study date: 01/16/2014. Study time: 09:08 AM. Location: [facility]  -------------------------------------------------------------------  ------------------------------------------------------------------- Left ventricle: The cavity size was normal. Wall thickness was increased in a pattern of mild LVH. There was moderate focal basal hypertrophy of the septum. Systolic function was normal. The estimated ejection fraction was in the range of 55% to 60%. Wall motion was normal; there were no regional wall motion abnormalities. Doppler parameters are consistent with abnormal left ventricular relaxation (grade 1 diastolic dysfunction). The E/e&' ratio is between 8-15, suggesting indeterminate LV filling pressure.  ------------------------------------------------------------------- Aortic valve: Bioprosthetic aortic valve. Peak and mean gradients of 26 mmHg and 15 mmHg, respectively. AT <100 msec and DVI >25. No evidence for obstruction. Doppler:   VTI ratio of LVOT to aortic valve: 0.35. Valve area (VTI): 1.33 cm^2. Indexed valve area (VTI): 0.56 cm^2/m^2. Valve area (Vmax): 1.24 cm^2. Indexed valve area (Vmax): 0.52 cm^2/m^2. Mean velocity ratio of LVOT to aortic valve: 0.31. Valve area (Vmean): 1.19 cm^2. Indexed valve area (Vmean): 0.5 cm^2/m^2.  Mean gradient (S):  15 mm Hg. Peak gradient (S): 26 mm Hg.  ------------------------------------------------------------------- Aorta: Aortic root: The aortic root was normal in size. Ascending aorta: The ascending aorta is dilated.  ------------------------------------------------------------------- Mitral valve:  Calcified annulus. Mildly thickened leaflets . Doppler: There was trivial regurgitation.  Peak gradient (D): 3 mm Hg.  ------------------------------------------------------------------- Left atrium: Moderately dilated 41 ml/m2.  ------------------------------------------------------------------- Atrial septum: Poorly visualized.  ------------------------------------------------------------------- Right ventricle: The cavity size was normal. Wall thickness was normal. Systolic function was normal.  ------------------------------------------------------------------- Pulmonic valve:  The valve appears to be grossly normal. Doppler: There was no significant regurgitation.  ------------------------------------------------------------------- Tricuspid valve:  Doppler: There was trivial regurgitation.  ------------------------------------------------------------------- Pulmonary artery:  The main pulmonary artery was normal-sized.  ------------------------------------------------------------------- Right atrium: The atrium was normal in size.  ------------------------------------------------------------------- Pericardium: There was no pericardial effusion.  ------------------------------------------------------------------- Systemic veins: Inferior vena cava: The vessel was normal in size. The respirophasic diameter changes were in the normal range (>= 50%), consistent with normal central venous pressure.  ------------------------------------------------------------------- Measurements  Left ventricle              Value     Reference LV ID, ED,  PLAX chordal      (H)   52.4 mm    43 - 52 LV ID, ES, PLAX chordal          36.4 mm    23 - 38 LV fx shortening, PLAX chordal      31  %    >=29 LV PW thickness, ED            10.7 mm    --------- IVS/LV PW ratio, ED        (H)   1.36      <=  1.3 Stroke volume, 2D             61  ml    --------- Stroke volume/bsa, 2D           25  ml/m^2  --------- LV e&', lateral              8.19 cm/s   --------- LV E/e&', lateral             9.71      --------- LV e&', medial               5.26 cm/s   --------- LV E/e&', medial              15.11     --------- LV e&', average              6.73 cm/s   --------- LV E/e&', average             11.82     ---------  Ventricular septum            Value     Reference IVS thickness, ED             14.6 mm    ---------  LVOT                   Value     Reference LVOT ID, S                22  mm    --------- LVOT area                 3.8  cm^2   --------- LVOT ID                  22  mm    --------- LVOT peak velocity, S           82.7 cm/s   --------- LVOT mean velocity, S           57.5 cm/s   --------- LVOT VTI, S                16.1 cm    --------- LVOT peak gradient, S           3   mm Hg  --------- Stroke volume (SV), LVOT DP        61.2 ml    --------- Stroke index (SV/bsa), LVOT DP      25.5 ml/m^2  ---------  Aortic valve               Value     Reference Aortic valve mean velocity, S       184  cm/s   --------- Aortic valve VTI, S            46.1 cm    --------- Aortic mean gradient, S           15  mm Hg  --------- Aortic peak gradient, S          26  mm Hg  --------- VTI ratio, LVOT/AV            0.35      --------- Aortic valve area, VTI          1.33 cm^2   --------- Aortic valve area/bsa, VTI        0.56 cm^2/m^2 --------- Aortic valve area, peak velocity     1.24 cm^2   --------- Aortic valve area/bsa, peak  0.52 cm^2/m^2 --------- velocity Velocity ratio, mean, LVOT/AV       0.31      --------- Aortic valve area, mean velocity     1.19 cm^2   --------- Aortic valve area/bsa, mean        0.5  cm^2/m^2 --------- velocity  Aorta                   Value     Reference Aortic root ID, ED            37  mm    --------- Ascending aorta ID, A-P, S        44  mm    ---------  Left atrium                Value     Reference LA ID, A-P, ES              44  mm    --------- LA ID/bsa, A-P              1.84 cm/m^2  <=2.2 LA volume, S               95  ml    --------- LA volume/bsa, S             39.6 ml/m^2  --------- LA volume, ES, 1-p A4C          96  ml    --------- LA volume/bsa, ES, 1-p A4C        40.1 ml/m^2  --------- LA volume, ES, 1-p A2C          92  ml    --------- LA volume/bsa, ES, 1-p A2C        38.4 ml/m^2  ---------  Mitral valve               Value     Reference Mitral E-wave peak velocity        79.5 cm/s   --------- Mitral A-wave peak velocity        88.8 cm/s   --------- Mitral deceleration time     (H)   327  ms    150 - 230 Mitral peak gradient, D          3   mm Hg  --------- Mitral E/A ratio, peak          0.9      ---------  Pulmonary  arteries            Value     Reference PA pressure, S, DP            22  mm Hg  <=30  Tricuspid valve              Value     Reference Tricuspid regurg peak velocity      217  cm/s   --------- Tricuspid peak RV-RA gradient       19  mm Hg  ---------  Systemic veins              Value     Reference Estimated CVP               3   mm Hg  ---------  Right ventricle              Value     Reference RV pressure, S, DP            22  mm Hg  <=30  RV s&', lateral, S             9.94 cm/s   ---------  Legend: (L) and (H) mark values outside specified reference range.  ------------------------------------------------------------------- Prepared and Electronically Authenticated by  Lyman Bishop MD 2015-10-28T11:21:40   Impression:  Patient is doing very well 3 months status post aortic valve replacement using a bioprosthetic tissue valve.    Plan:  I've encouraged the patient to resume unrestricted physical activity without any limitations at this time. He has been reminded regarding the lifelong need for antibiotic prophylaxis for all dental cleaning and related procedures. He will return for routine follow-up neck September, proximally 1 year following his original surgery. Otherwise he will call and return as needed.  I spent in excess of 10 minutes during the conduct of this office consultation and >50% of this time involved direct face-to-face encounter with the patient for counseling and/or coordination of their care.  Valentina Gu. Roxy Manns, MD 03/04/2014 1:12 PM

## 2014-03-04 NOTE — Patient Instructions (Addendum)
  Patient may resume unrestricted physical activity without any particular limitations at this time.   Endocarditis is a potentially serious infection of heart valves or inside lining of the heart.  It occurs more commonly in patients with diseased heart valves (such as patient's with aortic or mitral valve disease) and in patients who have undergone heart valve repair or replacement.  Certain surgical and dental procedures may put you at risk, such as dental cleaning, other dental procedures, or any surgery involving the respiratory, urinary, gastrointestinal tract, gallbladder or prostate gland.   To minimize your chances for develooping endocarditis, maintain good oral health and seek prompt medical attention for any infections involving the mouth, teeth, gums, skin or urinary tract.  Always notify your doctor or dentist about your underlying heart valve condition before having any invasive procedures. You will need to take antibiotics before certain procedures.

## 2014-04-03 ENCOUNTER — Other Ambulatory Visit: Payer: Self-pay

## 2014-04-03 MED ORDER — METOPROLOL TARTRATE 25 MG PO TABS: 25.0000 mg | ORAL_TABLET | Freq: Two times a day (BID) | ORAL | Status: DC

## 2014-04-11 ENCOUNTER — Ambulatory Visit (INDEPENDENT_AMBULATORY_CARE_PROVIDER_SITE_OTHER): Payer: 59 | Admitting: Sports Medicine

## 2014-04-11 ENCOUNTER — Encounter: Payer: Self-pay | Admitting: Sports Medicine

## 2014-04-11 VITALS — BP 120/77 | HR 70 | Ht 71.0 in | Wt 230.0 lb

## 2014-04-11 DIAGNOSIS — I10 Essential (primary) hypertension: Secondary | ICD-10-CM

## 2014-04-11 NOTE — Progress Notes (Signed)
  Subjective:    CC:   Follow-up  HPI: Aortic valve replacement: Doing extremely well.   It sounds like there was some aortic root dilation rotation from his preoperative CT and then his postoperative ultrasound, he is asymptomatic, and it is difficult to determine the importance of this difference in two different modalities of imaging.  Hypertension: Well controlled.  Past medical history, Surgical history, Family history not pertinant except as noted below, Social history, Allergies, and medications have been entered into the medical record, reviewed, and no changes needed.   Review of Systems: No fevers, chills, night sweats, weight loss, chest pain, or shortness of breath.   Objective:    General: Well Developed, well nourished, and in no acute distress.  Neuro: Alert and oriented x3, extra-ocular muscles intact, sensation grossly intact.  HEENT: Normocephalic, atraumatic, pupils equal round reactive to light, neck supple, no masses, no lymphadenopathy, thyroid nonpalpable.  Skin: Warm and dry, no rashes. Cardiac: Regular rate and rhythm, no rubs or gallops, no lower extremity edema.   There is a 2/6 systolic ejection murmur at the right second intercostal space of the parasternal line. Respiratory: Clear to auscultation bilaterally. Not using accessory muscles, speaking in full sentences.  Impression and Recommendations:     I spent 40 minutes with this patient, greater than 50% was face-to-face time counseling regarding the above diagnosis.

## 2014-04-11 NOTE — Assessment & Plan Note (Signed)
Well-controlled, no changes. Return in 6 months to one year.

## 2014-04-19 ENCOUNTER — Encounter: Payer: 59 | Admitting: Gastroenterology

## 2014-05-21 ENCOUNTER — Telehealth: Payer: Self-pay | Admitting: *Deleted

## 2014-05-21 NOTE — Telephone Encounter (Signed)
Patrick Walter,   Patrick Walter  is cleared for anesthetic care at [facility],  [name]

## 2014-05-21 NOTE — Telephone Encounter (Signed)
John or [employer],  With this pt's cardiac hx and surgery within the last few months, I just wanted to make sure he was ok for [place]  Thanks, [employer]

## 2014-05-24 ENCOUNTER — Ambulatory Visit (AMBULATORY_SURGERY_CENTER): Payer: Self-pay | Admitting: *Deleted

## 2014-05-24 VITALS — Ht 71.0 in | Wt 226.0 lb

## 2014-05-24 DIAGNOSIS — Z800 Family history of malignant neoplasm of digestive organs: Secondary | ICD-10-CM

## 2014-05-24 MED ORDER — NA SULFATE-K SULFATE-MG SULF 17.5-3.13-1.6 GM/177ML PO SOLN: 1.0000 | Freq: Once | ORAL | Status: DC

## 2014-05-24 NOTE — Progress Notes (Signed)
No egg or soy allergy. No anesthesia problems.  No home O2.  No diet meds.  

## 2014-05-28 ENCOUNTER — Encounter: Payer: Self-pay | Admitting: Cardiovascular Disease

## 2014-05-28 ENCOUNTER — Ambulatory Visit (INDEPENDENT_AMBULATORY_CARE_PROVIDER_SITE_OTHER): Payer: 59 | Admitting: Cardiovascular Disease

## 2014-05-28 VITALS — BP 115/82 | HR 67 | Ht 71.0 in | Wt 226.8 lb

## 2014-05-28 DIAGNOSIS — I359 Nonrheumatic aortic valve disorder, unspecified: Secondary | ICD-10-CM

## 2014-05-28 MED ORDER — ASPIRIN 81 MG PO TBEC: 81.0000 mg | DELAYED_RELEASE_TABLET | Freq: Every day | ORAL | Status: DC

## 2014-05-28 NOTE — Patient Instructions (Signed)
Your physician has recommended you make the following change in your medication:  1. DECREASE Aspirin to 81mg  take one by mouth daily  Your physician has requested that you have an echocardiogram in 1 YEAR. Echocardiography is a painless test that uses sound waves to create images of your heart. It provides your doctor with information about the size and shape of your heart and how well your heart's chambers and valves are working. This procedure takes approximately one hour. There are no restrictions for this procedure.  Your physician wants you to follow-up in: 1 YEAR with Dr Burt Knack. You will receive a reminder letter in the mail two months in advance. If you don't receive a letter, please call our office to schedule the follow-up appointment.

## 2014-05-28 NOTE — Progress Notes (Signed)
Cardiology Office Note   Date:  05/28/2014   ID:  Cataldo Cosgriff, DOB Oct 26, 1955, MRN [id number]  PCP:  Aundria Mems, MD  Cardiologist:  [name], MD    Chief Complaint  Patient presents with  . Coronary Artery Disease    discuss aspirin dosage     History of Present Illness: Patrick Walter is a 59 y.o. male who presents for follow-up of aortic valve disease. He underwent bioprosthetic aortic valve replacement in September 2015 for treatment of severe symptomatic bicuspid aortic stenosis. By CT scan the maximum diameter of his ascending aorta was 4 cm.  He's doing very well. He's been engaged in a good diet and exercise program. His weight is down almost 40 pounds by my scales. He denies chest pain, chest pressure, shortness of breath, heart palpitations, or leg swelling. He is compliant with SBE prophylaxis measures. He has no specific complaints today.   Past Medical History  Diagnosis Date  . Hypertension   . Bicuspid aortic valve   . Aortic stenosis 07/24/2013  . Coronary artery disease   . Heart murmur   . S/P aortic valve replacement with bioprosthetic valve 12/05/2013    25 mm [facility] Ease bovine pericardial tissue valve   . Patent foramen ovale 10/23/2013    Closed at the time of aortic valve replacement  . Hx of echocardiogram     post AVR >> Echo (10/15):  Mild LVH, mod focal basal septal hypertrophy, EF 55-60%, Gr 1 DD, AVR ok (mean 15 mmHg), dilated ascending Aorta (44 mm), mod LAE    Past Surgical History  Procedure Laterality Date  . Vasectomy  06/2002  . Hand surgery Left 07/1977  . Tee without cardioversion N/A 10/23/2013    Procedure: TRANSESOPHAGEAL ECHOCARDIOGRAM (TEE);  Surgeon: Larey Dresser, MD;  Location: [place];  Service: Cardiovascular;  Laterality: N/A;  . Aortic valve replacement N/A 12/05/2013    Procedure: AORTIC VALVE REPLACEMENT (AVR);  Surgeon: [name], MD;  Location: [facility];  Service: Open Heart Surgery;   Laterality: N/A;  . Intraoperative transesophageal echocardiogram N/A 12/05/2013    Procedure: INTRAOPERATIVE TRANSESOPHAGEAL ECHOCARDIOGRAM;  Surgeon: [name], MD;  Location: [facility];  Service: Open Heart Surgery;  Laterality: N/A;  . Patent foramen ovale closure N/A 12/05/2013    Procedure: PATENT FORAMEN OVALE CLOSURE;  Surgeon: [name], MD;  Location: [place];  Service: Open Heart Surgery;  Laterality: N/A;  . Left and right heart catheterization with coronary angiogram N/A 11/08/2013    Procedure: LEFT AND RIGHT HEART CATHETERIZATION WITH CORONARY ANGIOGRAM;  Surgeon: Blane Ohara, MD;  Location: [facility] CATH LAB;  Service: Cardiovascular;  Laterality: N/A;    Current Outpatient Prescriptions  Medication Sig Dispense Refill  . aspirin 81 MG EC tablet Take 1 tablet (81 mg total) by mouth daily.    . Coenzyme Q10 300 MG CAPS Take 1 capsule by mouth daily.    Marland Kitchen lisinopril (PRINIVIL,ZESTRIL) 10 MG tablet Take 1 tablet (10 mg total) by mouth daily. 90 tablet 3  . metoprolol tartrate (LOPRESSOR) 25 MG tablet Take 1 tablet (25 mg total) by mouth 2 (two) times daily. 60 tablet 3  . Multiple Vitamin (MULTIVITAMIN WITH MINERALS) TABS tablet Take 1 tablet by mouth daily.    . Na Sulfate-K Sulfate-Mg Sulf (SUPREP BOWEL PREP) SOLN Take 1 kit by mouth once. Name brand only, suprep as directed, no substitutions 354 mL 0  . Omega 3 1000 MG CAPS Take 3  capsules by mouth daily.    . Probiotic Product (PROBIOTIC PO) Take 1 capsule by mouth daily.     No current facility-administered medications for this visit.    Allergies:   Review of patient's allergies indicates no known allergies.   Social History:  The patient  reports that he has never smoked. He has never used smokeless tobacco. He reports that he drinks alcohol. He reports that he does not use illicit drugs.   Family History:  The patient's  family history includes Cancer in his daughter, father, maternal grandfather, maternal  grandmother, mother, paternal grandfather, and paternal grandmother; Colon cancer (age of onset: 62) in his paternal grandmother; Colon cancer (age of onset: 21) in his father; Diabetes in his father, maternal grandfather, maternal grandmother, mother, paternal grandfather, and paternal grandmother; Heart attack in his maternal grandmother; Hypertension in his maternal grandmother; Stroke in his paternal grandfather.    ROS:  Please see the history of present illness.  Otherwise negative.  PHYSICAL EXAM: VS:  BP 115/82 mmHg  Pulse 67  Ht _0  (1.803 m)  Wt 226 lb 12.8 oz (102.876 kg)  BMI 31.65 kg/m2  SpO2 98% , BMI Body mass index is 31.65 kg/(m^2). GEN: Well nourished, well developed, in no acute distress HEENT: normal Neck: no JVD, no masses. No carotid bruits Cardiac: RRR grade 2/6 systolic ejection murmur at the right upper sternal border            Respiratory:  clear to auscultation bilaterally, normal work of breathing GI: soft, nontender, nondistended, + BS MS: no deformity or atrophy Ext: no pretibial edema, pedal pulses 2+= bilaterally Skin: warm and dry, no rash Neuro:  Strength and sensation are intact Psych: euthymic mood, full affect  EKG:  EKG is not ordered today.  Recent Labs: 08/31/2013: TSH 2.951 12/03/2013: ALT 21 12/06/2013: Magnesium 2.4 12/07/2013: BUN 23; Creatinine 1.18; Hemoglobin 9.6*; Platelets 123*; Potassium 4.6; Sodium 138   Lipid Panel     Component Value Date/Time   CHOL 185 08/31/2013 0852   TRIG 86 08/31/2013 0852   HDL 45 08/31/2013 0852   CHOLHDL 4.1 08/31/2013 0852   VLDL 17 08/31/2013 0852   LDLCALC 123* 08/31/2013 0852      Wt Readings from Last 3 Encounters:  05/28/14 226 lb 12.8 oz (102.876 kg)  05/24/14 226 lb (102.513 kg)  04/11/14 230 lb (104.327 kg)     Cardiac Studies Reviewed: 2-D echocardiogram 01/16/2014: Study Conclusions - Left ventricle: The cavity size was normal. Wall thickness was increased in a pattern of  mild LVH. There was moderate focal basal hypertrophy of the septum. Systolic function was normal. The estimated ejection fraction was in the range of 55% to 60%. Wall motion was normal; there were no regional wall motion abnormalities. Doppler parameters are consistent with abnormal left ventricular relaxation (grade 1 diastolic dysfunction). The E/e&' ratio is between 8-15, suggesting indeterminate LV filling pressure. - Aortic valve: Bioprosthetic aortic valve. Peak and mean gradients of 26 mmHg and 15 mmHg, respectively. AT <100 msec and DVI >25. No evidence for obstruction. - Aorta: Ascending aortic diameter: 44 mm (S). - Ascending aorta: The ascending aorta is dilated. - Mitral valve: Calcified annulus. Mildly thickened leaflets . - Left atrium: Moderately dilated 41 ml/m2.  Impressions:  - Compared to the prior echo in 11/2013, there is now a bioprosthetic aortic valve which is functioning normally without evidence for obstruction or mismatch. The ascending aorta does measure larger at 4.4 cm.  ASSESSMENT AND  PLAN: 1.  Aortic valve disease status post bioprosthetic aortic valve replacement: Patient is stable with normal postoperative valve function. Will repeat an echo prior to his return visit in one year. He will continue to follow SBE prophylaxis as per guidelines.  2. Dilated aortic root. By CT angiogram his aortic root measured 4.0 cm in maximal diameter. By echo the diameter was somewhat larger 4.4 cm. I suspect this is secondary to different imaging modalities, but will check with Dr. Roxy Manns to see if he wants a repeat CTA next year.  3. Essential hypertension: The patient's blood pressure is well controlled. Weight loss has helped significantly. In the setting of his dilated aortic root, I would like him to continue on a beta blocker and ACE inhibitor.  Current medicines are reviewed with the patient today.  The patient does not have concerns regarding  medicines.  The following changes have been made:  no change  Labs/ tests ordered today include:  Orders Placed This Encounter  Procedures  . 2D Echocardiogram without contrast   Disposition:   FU one year with an echocardiogram prior to that visit.  Signed, [name], MD  05/28/2014 4:39 PM    [facility] [place], [place], [place]  [address] Phone: [phone]; Fax: [phone]

## 2014-06-07 ENCOUNTER — Encounter: Payer: Self-pay | Admitting: Gastroenterology

## 2014-06-07 ENCOUNTER — Ambulatory Visit (AMBULATORY_SURGERY_CENTER): Payer: 59 | Admitting: Gastroenterology

## 2014-06-07 VITALS — BP 114/76 | HR 60 | Temp 97.3°F | Resp 20 | Ht 71.0 in | Wt 226.0 lb

## 2014-06-07 DIAGNOSIS — Z1211 Encounter for screening for malignant neoplasm of colon: Secondary | ICD-10-CM

## 2014-06-07 DIAGNOSIS — K5730 Diverticulosis of large intestine without perforation or abscess without bleeding: Secondary | ICD-10-CM

## 2014-06-07 DIAGNOSIS — Z800 Family history of malignant neoplasm of digestive organs: Secondary | ICD-10-CM

## 2014-06-07 MED ORDER — SODIUM CHLORIDE 0.9 % IV SOLN: 500.0000 mL | INTRAVENOUS | Status: DC

## 2014-06-07 NOTE — Patient Instructions (Signed)
YOU HAD AN ENDOSCOPIC PROCEDURE TODAY AT [facility]:   Refer to the procedure report that was given to you for any specific questions about what was found during the examination.  If the procedure report does not answer your questions, please call your gastroenterologist to clarify.  If you requested that your care partner not be given the details of your procedure findings, then the procedure report has been included in a sealed envelope for you to review at your convenience later.  YOU SHOULD EXPECT: Some feelings of bloating in the abdomen. Passage of more gas than usual.  Walking can help get rid of the air that was put into your GI tract during the procedure and reduce the bloating. If you had a lower endoscopy (such as a colonoscopy or flexible sigmoidoscopy) you may notice spotting of blood in your stool or on the toilet paper. If you underwent a bowel prep for your procedure, you may not have a normal bowel movement for a few days.  Please Note:  You might notice some irritation and congestion in your nose or some drainage.  This is from the oxygen used during your procedure.  There is no need for concern and it should clear up in a day or so.  SYMPTOMS TO REPORT IMMEDIATELY:   Following lower endoscopy (colonoscopy or flexible sigmoidoscopy):  Excessive amounts of blood in the stool  Significant tenderness or worsening of abdominal pains  Swelling of the abdomen that is new, acute  Fever of 100F or higher  For urgent or emergent issues, a gastroenterologist can be reached at any hour by calling [phone].   DIET: Your first meal following the procedure should be a small meal and then it is ok to progress to your normal diet. Heavy or fried foods are harder to digest and may make you feel nauseous or bloated.  Likewise, meals heavy in dairy and vegetables can increase bloating.  Drink plenty of fluids but you should avoid alcoholic beverages for 24  hours.  ACTIVITY:  You should plan to take it easy for the rest of today and you should NOT DRIVE or use heavy machinery until tomorrow (because of the sedation medicines used during the test).    FOLLOW UP: Our staff will call the number listed on your records the next business day following your procedure to check on you and address any questions or concerns that you may have regarding the information given to you following your procedure. If we do not reach you, we will leave a message.  However, if you are feeling well and you are not experiencing any problems, there is no need to return our call.  We will assume that you have returned to your regular daily activities without incident.  If any biopsies were taken you will be contacted by phone or by letter within the next 1-3 weeks.  Please call us at [phone] if you have not heard about the biopsies in 3 weeks.    SIGNATURES/CONFIDENTIALITY: You and/or your care partner have signed paperwork which will be entered into your electronic medical record.  These signatures attest to the fact that that the information above on your After Visit Summary has been reviewed and is understood.  Full responsibility of the confidentiality of this discharge information lies with you and/or your care-partner.  Diverticulosis, high fiber diet-handouts given

## 2014-06-07 NOTE — Op Note (Addendum)
[facility]  [address] [place], [address]   COLONOSCOPY PROCEDURE REPORT  PATIENT: Patrick Walter, Patrick Walter  MR#: [id number] BIRTHDATE: Apr 17, 1955 , 51  yrs. old GENDER: male ENDOSCOPIST: Inda Castle, MD REFERRED BY: PROCEDURE DATE:  06/07/2014 PROCEDURE:   Colonoscopy, screening First Screening Colonoscopy - Avg.  risk and is 50 yrs.  old or older Yes.  Prior Negative Screening - Now for repeat screening. N/A  History of Adenoma - Now for follow-up colonoscopy & has been > or = to 3 yrs.  N/A ASA CLASS:   Class II INDICATIONS:high risk - patient's immediate family history of colon cancer. MEDICATIONS: Monitored anesthesia care, Propofol 400 mg IV, and Lidocaine 40 mg IV  DESCRIPTION OF PROCEDURE:   After the risks benefits and alternatives of the procedure were thoroughly explained, informed consent was obtained.  The digital rectal exam revealed no abnormalities of the rectum.   The LB [id number]  endoscope was introduced through the anus and advanced to the cecum, which was identified by both the appendix and ileocecal valve. No adverse events experienced.   The quality of the prep was (Suprep was used) good.  The instrument was then slowly withdrawn as the colon was fully examined.      COLON FINDINGS: There was moderate diverticulosis noted at the appendiceal orifice and in the sigmoid colon.   The examination was otherwise normal.  Retroflexed views revealed no abnormalities. The time to cecum = 9.4 Withdrawal time = 8.3   The scope was withdrawn and the procedure completed. COMPLICATIONS: There were no immediate complications.  ENDOSCOPIC IMPRESSION: 1.   Moderate diverticulosis was noted at the appendiceal orifice and in the sigmoid colon 2.   The examination was otherwise normal  RECOMMENDATIONS: 1.  Given your significant family history of colon cancer, you should have a repeat colonoscopy in 5 years  eSigned:  Inda Castle, MD  06/07/2014 3:25 PM Revised: 06/07/2014 3:25 PM  cc: Aundria Mems, MD

## 2014-06-07 NOTE — Progress Notes (Signed)
Stable to RR 

## 2014-06-10 ENCOUNTER — Telehealth: Payer: Self-pay | Admitting: *Deleted

## 2014-06-10 NOTE — Telephone Encounter (Signed)
  Follow up Call-  Call back number 06/07/2014  Post procedure Call Back phone  # [phone]  Permission to leave phone message Yes     Patient questions:  Do you have a fever, pain , or abdominal swelling? No. Pain Score  0 *  Have you tolerated food without any problems? Yes.    Have you been able to return to your normal activities? Yes.    Do you have any questions about your discharge instructions: Diet   No. Medications  No. Follow up visit  No.  Do you have questions or concerns about your Care? No.  Actions: * If pain score is 4 or above: No action needed, pain <4.

## 2014-07-17 ENCOUNTER — Other Ambulatory Visit: Payer: Self-pay | Admitting: Cardiovascular Disease

## 2014-08-14 ENCOUNTER — Other Ambulatory Visit: Payer: Self-pay | Admitting: Cardiovascular Disease

## 2014-12-09 ENCOUNTER — Encounter: Payer: Self-pay | Admitting: Thoracic Surgery (Cardiothoracic Vascular Surgery)

## 2014-12-09 ENCOUNTER — Ambulatory Visit (INDEPENDENT_AMBULATORY_CARE_PROVIDER_SITE_OTHER): Payer: 59 | Admitting: Thoracic Surgery (Cardiothoracic Vascular Surgery)

## 2014-12-09 VITALS — BP 115/71 | HR 73 | Resp 20 | Ht 71.0 in | Wt 226.0 lb

## 2014-12-09 DIAGNOSIS — Z953 Presence of xenogenic heart valve: Secondary | ICD-10-CM

## 2014-12-09 DIAGNOSIS — Z954 Presence of other heart-valve replacement: Secondary | ICD-10-CM | POA: Diagnosis not present

## 2014-12-09 DIAGNOSIS — I7789 Other specified disorders of arteries and arterioles: Secondary | ICD-10-CM

## 2014-12-09 NOTE — Progress Notes (Signed)
      [address]       ,[place] [address]             [facility] OFFICE NOTE  Referring Provider is [name], MD PCP is Aundria Mems, MD   HPI:  Patient returns for routine follow-up approximately one year status post aortic valve replacement using a bioprosthetic tissue valve with closure of patent foramen ovale on 12/05/2013. Routine follow-up echocardiogram performed 01/16/2014 revealed normal left ventricular systolic function with ejection fraction estimated 55-60%. The bioprosthetic tissue valve in the aortic position was functioning normally. Mean transvalvular gradient was reported 15 mmHg.  He was last seen here in our office on 03/04/2014 at which time he was doing well.  Since then the patient has continued to do exceptionally well. He has lost 70 pounds in weight and been exercising regularly. He reports that he feels much better than he did prior to his heart surgery 1 year ago. He has no physical limitations whatsoever. He denies any symptoms of exertional shortness of breath or chest discomfort.  He states that he feels like a new person.   Current Outpatient Prescriptions  Medication Sig Dispense Refill  . aspirin 81 MG EC tablet Take 1 tablet (81 mg total) by mouth daily.    . Coenzyme Q10 300 MG CAPS Take 1 capsule by mouth daily.    Marland Kitchen lisinopril (PRINIVIL,ZESTRIL) 10 MG tablet Take 1 tablet (10 mg total) by mouth daily. 90 tablet 3  . metoprolol tartrate (LOPRESSOR) 25 MG tablet TAKE 1 TABLET (25 MG TOTAL) BY MOUTH 2 (TWO) TIMES DAILY. 60 tablet 10  . Multiple Vitamin (MULTIVITAMIN WITH MINERALS) TABS tablet Take 1 tablet by mouth daily.    . Omega 3 1000 MG CAPS Take 3 capsules by mouth daily.    . Probiotic Product (PROBIOTIC PO) Take 1 capsule by mouth daily.     No current facility-administered medications for this visit.      Physical Exam:   BP 115/71 mmHg  Pulse 73  Resp 20  Ht 5\' 11"  (1.803  m)  Wt 226 lb (102.513 kg)  BMI 31.53 kg/m2  SpO2 98%  General:  Well-appearing  Chest:   clear  CV:   Regular rate and rhythm with soft systolic murmur heard best along the sternal borders  Incisions:  Completely healed, sternum is stable  Abdomen:  Soft and nontender  Extremities:  Warm and well-perfused  Diagnostic Tests:  n/a   Impression:  Patient is doing very well approximately one year following aortic valve replacement using a bioprosthetic tissue valve for bicuspid aortic valve disease with severe symptomatic aortic stenosis. Cardiac gated CT angiogram of the heart performed prior to surgery demonstrated mild fusiform dilatation of the ascending thoracic aorta with maximum transverse diameter measured 4.0 cm.    Plan:  The patient will continue to follow-up with Dr. Burt Knack. We have not recommended any changes with his current medications. The patient has been reminded regarding the need for anti-biotic prophylaxis for all dental cleanings and related procedures. The patient will return in 1 year for follow-up CT angiogram of the chest.    I spent in excess of 15 minutes during the conduct of this office consultation and >50% of this time involved direct face-to-face encounter with the patient for counseling and/or coordination of their care.   Valentina Gu. Roxy Manns, MD 12/09/2014 4:12 PM

## 2014-12-09 NOTE — Patient Instructions (Signed)

## 2015-02-17 ENCOUNTER — Other Ambulatory Visit: Payer: Self-pay | Admitting: Cardiovascular Disease

## 2015-02-21 ENCOUNTER — Other Ambulatory Visit: Payer: Self-pay | Admitting: Cardiovascular Disease

## 2015-04-21 ENCOUNTER — Encounter: Payer: Self-pay | Admitting: Sports Medicine

## 2015-04-21 ENCOUNTER — Ambulatory Visit (INDEPENDENT_AMBULATORY_CARE_PROVIDER_SITE_OTHER): Payer: 59 | Admitting: Sports Medicine

## 2015-04-21 VITALS — BP 131/82 | HR 55 | Resp 16 | Wt 205.0 lb

## 2015-04-21 DIAGNOSIS — I10 Essential (primary) hypertension: Secondary | ICD-10-CM

## 2015-04-21 DIAGNOSIS — Z23 Encounter for immunization: Secondary | ICD-10-CM | POA: Diagnosis not present

## 2015-04-21 NOTE — Addendum Note (Signed)
Addended by: Elizabeth Sauer on: 04/21/2015 05:12 PM   Modules accepted: Orders

## 2015-04-21 NOTE — Assessment & Plan Note (Signed)
Discontinue metoprolol and lisinopril, he is only doing half tabs currently. Return in 2 weeks to recheck blood pressure

## 2015-04-21 NOTE — Progress Notes (Signed)
  Subjective:    CC: follow-up  HPI: I have not seen Patrick Walter in some time, he is doing well post his bioprosthetic aortic valve replacement, he also has hypertension, and is only on 5 mg lisinopril and 12.5 mg of metoprolol, overall he is here to see if he can come off of his blood pressure medications. At home his blood pressures in the 0000000 systolic, [id number] here.  Past medical history, Surgical history, Family history not pertinant except as noted below, Social history, Allergies, and medications have been entered into the medical record, reviewed, and no changes needed.   Review of Systems: No fevers, chills, night sweats, weight loss, chest pain, or shortness of breath.   Objective:    General: Well Developed, well nourished, and in no acute distress.  Neuro: Alert and oriented x3, extra-ocular muscles intact, sensation grossly intact.  HEENT: Normocephalic, atraumatic, pupils equal round reactive to light, neck supple, no masses, no lymphadenopathy, thyroid nonpalpable.  Skin: Warm and dry, no rashes. Cardiac: Regular rate and rhythm, no murmurs rubs or gallops, no lower extremity edema.  Respiratory: Clear to auscultation bilaterally. Not using accessory muscles, speaking in full sentences.  Impression and Recommendations:    I spent 25 minutes with this patient, greater than 50% was face-to-face time counseling regarding the above diagnoses

## 2015-04-22 LAB — COMPREHENSIVE METABOLIC PANEL WITH GFR
ALT: 20 U/L (ref 9–46)
Albumin: 3.8 g/dL (ref 3.6–5.1)
Alkaline Phosphatase: 51 U/L (ref 40–115)
BUN: 19 mg/dL (ref 7–25)
Creat: 1.12 mg/dL (ref 0.70–1.33)
Sodium: 139 mmol/L (ref 135–146)
Total Bilirubin: 0.8 mg/dL (ref 0.2–1.2)
Total Protein: 6.5 g/dL (ref 6.1–8.1)

## 2015-04-22 LAB — CBC
HCT: 41 % (ref 39.0–52.0)
Hemoglobin: 13.5 g/dL (ref 13.0–17.0)
MCH: 28.3 pg (ref 26.0–34.0)
MCHC: 32.9 g/dL (ref 30.0–36.0)
MCV: 86 fL (ref 78.0–100.0)
MPV: 11.6 fL (ref 8.6–12.4)
Platelets: 206 K/uL (ref 150–400)
RBC: 4.77 MIL/uL (ref 4.22–5.81)
RDW: 13.3 % (ref 11.5–15.5)
WBC: 5.9 K/uL (ref 4.0–10.5)

## 2015-04-22 LAB — LIPID PANEL
Cholesterol: 181 mg/dL (ref 125–200)
HDL: 43 mg/dL (ref >=40)
LDL Cholesterol: 125 mg/dL (ref <130)
Total CHOL/HDL Ratio: 4.2 Ratio (ref <=5.0)
Triglycerides: 63 mg/dL (ref <150)
VLDL: 13 mg/dL (ref <30)

## 2015-04-22 LAB — COMPREHENSIVE METABOLIC PANEL
AST: 18 U/L (ref 10–35)
CO2: 28 mmol/L (ref 20–31)
Calcium: 10 mg/dL (ref 8.6–10.3)
Chloride: 106 mmol/L (ref 98–110)
Glucose, Bld: 87 mg/dL (ref 65–99)
Potassium: 4.8 mmol/L (ref 3.5–5.3)

## 2015-04-22 LAB — HEMOGLOBIN A1C
Hgb A1c MFr Bld: 5.7 % — ABNORMAL HIGH (ref <5.7)
Mean Plasma Glucose: 117 mg/dL — ABNORMAL HIGH (ref <117)

## 2015-04-22 LAB — TSH: TSH: 2.451 u[IU]/mL (ref 0.350–4.500)

## 2015-04-23 LAB — VITAMIN D 25 HYDROXY (VIT D DEFICIENCY, FRACTURES): Vit D, 25-Hydroxy: 28 ng/mL — ABNORMAL LOW (ref 30–100)

## 2015-04-23 MED ORDER — VITAMIN D (ERGOCALCIFEROL) 1.25 MG (50000 UNIT) PO CAPS
50000.0000 [IU] | ORAL_CAPSULE | ORAL | Status: DC
Start: 2015-04-23 — End: 2015-09-01

## 2015-04-23 NOTE — Addendum Note (Signed)
Addended by: [name] on: 04/23/2015 11:59 AM   Modules accepted: Orders

## 2015-05-06 ENCOUNTER — Encounter: Payer: Self-pay | Admitting: Sports Medicine

## 2015-05-06 ENCOUNTER — Ambulatory Visit (INDEPENDENT_AMBULATORY_CARE_PROVIDER_SITE_OTHER): Payer: 59 | Admitting: Sports Medicine

## 2015-05-06 VITALS — BP 138/85 | HR 72 | Resp 18 | Wt 206.9 lb

## 2015-05-06 DIAGNOSIS — L989 Disorder of the skin and subcutaneous tissue, unspecified: Secondary | ICD-10-CM

## 2015-05-06 DIAGNOSIS — L918 Other hypertrophic disorders of the skin: Secondary | ICD-10-CM | POA: Insufficient documentation

## 2015-05-06 DIAGNOSIS — I10 Essential (primary) hypertension: Secondary | ICD-10-CM | POA: Diagnosis not present

## 2015-05-06 NOTE — Assessment & Plan Note (Signed)
Cryotherapy as above, return in one month if still present and we will repeat

## 2015-05-06 NOTE — Assessment & Plan Note (Signed)
Blood pressure is now diet controlled.

## 2015-05-06 NOTE — Progress Notes (Signed)
  Subjective:    CC: follow-up  HPI: Hypertension: We discontinued all medications at the last visit, he returns today with blood pressure continuing to be normal.  Skin tag: Left ear, desires cryotherapy  Past medical history, Surgical history, Family history not pertinant except as noted below, Social history, Allergies, and medications have been entered into the medical record, reviewed, and no changes needed.   Review of Systems: No fevers, chills, night sweats, weight loss, chest pain, or shortness of breath.   Objective:    General: Well Developed, well nourished, and in no acute distress.  Neuro: Alert and oriented x3, extra-ocular muscles intact, sensation grossly intact.  HEENT: Normocephalic, atraumatic, pupils equal round reactive to light, neck supple, no masses, no lymphadenopathy, thyroid nonpalpable.  Skin: Warm and dry, no rashes.  Skin tag on the left ear at the tragus. Cardiac: Regular rate and rhythm, no murmurs rubs or gallops, no lower extremity edema.  Respiratory: Clear to auscultation bilaterally. Not using accessory muscles, speaking in full sentences.  Procedure:  Cryodestruction of left tragus skin tag Consent obtained and verified. Time-out conducted. Noted no overlying erythema, induration, or other signs of local infection. Completed without difficulty using Cryo-Gun. Advised to call if fevers/chills, erythema, induration, drainage, or persistent bleeding.  Impression and Recommendations:

## 2015-06-04 ENCOUNTER — Ambulatory Visit (INDEPENDENT_AMBULATORY_CARE_PROVIDER_SITE_OTHER): Payer: 59 | Admitting: Sports Medicine

## 2015-06-04 DIAGNOSIS — L989 Disorder of the skin and subcutaneous tissue, unspecified: Secondary | ICD-10-CM

## 2015-06-04 DIAGNOSIS — L918 Other hypertrophic disorders of the skin: Secondary | ICD-10-CM

## 2015-06-04 NOTE — Assessment & Plan Note (Signed)
Surgical excision with hyfrecation, return in one month

## 2015-06-04 NOTE — Progress Notes (Signed)
   Procedure:  Excision of left tragus skin tag Risks, benefits, and alternatives explained and consent obtained. Time out conducted. Surface prepped with alcohol. 5cc lidocaine with epinephine infiltrated in a field block. Adequate anesthesia ensured. Area prepped and draped in a sterile fashion. Excision performed with: Using a #11 blade I made a shave incision of the skin tag, then I used a Hyfrecator to establish hemostasis. Hemostasis achieved. Pt stable.

## 2015-07-02 ENCOUNTER — Ambulatory Visit: Payer: 59 | Admitting: Sports Medicine

## 2015-08-22 ENCOUNTER — Encounter: Payer: Self-pay | Admitting: Cardiovascular Disease

## 2015-08-25 MED ORDER — AMOXICILLIN 500 MG PO TABS: ORAL_TABLET | ORAL | Status: DC

## 2015-09-01 ENCOUNTER — Ambulatory Visit (INDEPENDENT_AMBULATORY_CARE_PROVIDER_SITE_OTHER): Payer: 59 | Admitting: Cardiovascular Disease

## 2015-09-01 ENCOUNTER — Encounter: Payer: Self-pay | Admitting: Cardiovascular Disease

## 2015-09-01 VITALS — BP 140/88 | HR 60 | Ht 71.0 in | Wt 197.8 lb

## 2015-09-01 DIAGNOSIS — Z954 Presence of other heart-valve replacement: Secondary | ICD-10-CM | POA: Diagnosis not present

## 2015-09-01 DIAGNOSIS — I350 Nonrheumatic aortic (valve) stenosis: Secondary | ICD-10-CM

## 2015-09-01 DIAGNOSIS — Z952 Presence of prosthetic heart valve: Secondary | ICD-10-CM

## 2015-09-01 NOTE — Progress Notes (Signed)
Cardiology Office Note Date:  09/01/2015   ID:  Patrick Walter, DOB 05/12/1955, MRN [id number]  PCP:  Aundria Mems, MD  Cardiologist:  [name], MD    Chief Complaint  Patient presents with  . Essential Hypertension    No cp,sob,lee,or claudication  . Aortic Valve Disease    s/p bioprosthetic valve replacement     History of Present Illness: Patrick Walter is a 60 y.o. male who presents for follow-up of aortic valve disease. He underwent bioprosthetic aortic valve replacement in September 2015 for treatment of severe symptomatic bicuspid aortic stenosis. By CT scan the maximum diameter of his ascending aorta was 4 cm.  Today, he denies symptoms of palpitations, chest pain, shortness of breath, orthopnea, PND, lower extremity edema, dizziness, or syncope.  He's lost a great deal of weight with diet and exercise. He feels well. Stopped his antihypertensive medications because of postural dizziness.   Past Medical History  Diagnosis Date  . Hypertension   . Bicuspid aortic valve   . Aortic stenosis 07/24/2013  . Coronary artery disease   . Heart murmur   . S/P aortic valve replacement with bioprosthetic valve 12/05/2013    25 mm [facility] Ease bovine pericardial tissue valve   . Patent foramen ovale 10/23/2013    Closed at the time of aortic valve replacement  . Hx of echocardiogram     post AVR >> Echo (10/15):  Mild LVH, mod focal basal septal hypertrophy, EF 55-60%, Gr 1 DD, AVR ok (mean 15 mmHg), dilated ascending Aorta (44 mm), mod LAE  . Ascending aorta enlargement [facility])     Past Surgical History  Procedure Laterality Date  . Vasectomy  06/2002  . Hand surgery Left 07/1977  . Tee without cardioversion N/A 10/23/2013    Procedure: TRANSESOPHAGEAL ECHOCARDIOGRAM (TEE);  Surgeon: Larey Dresser, MD;  Location: [place];  Service: Cardiovascular;  Laterality: N/A;  . Aortic valve replacement N/A 12/05/2013    Procedure: AORTIC VALVE REPLACEMENT (AVR);   Surgeon: [name], MD;  Location: [place];  Service: Open Heart Surgery;  Laterality: N/A;  . Intraoperative transesophageal echocardiogram N/A 12/05/2013    Procedure: INTRAOPERATIVE TRANSESOPHAGEAL ECHOCARDIOGRAM;  Surgeon: [name], MD;  Location: [place];  Service: Open Heart Surgery;  Laterality: N/A;  . Patent foramen ovale closure N/A 12/05/2013    Procedure: PATENT FORAMEN OVALE CLOSURE;  Surgeon: [name], MD;  Location: [place];  Service: Open Heart Surgery;  Laterality: N/A;  . Left and right heart catheterization with coronary angiogram N/A 11/08/2013    Procedure: LEFT AND RIGHT HEART CATHETERIZATION WITH CORONARY ANGIOGRAM;  Surgeon: Blane Ohara, MD;  Location: [facility] CATH LAB;  Service: Cardiovascular;  Laterality: N/A;    Current Outpatient Prescriptions  Medication Sig Dispense Refill  . amoxicillin (AMOXIL) 500 MG tablet Take 4 tablets by mouth one hour prior to dental appointment 8 tablet 2  . aspirin 81 MG EC tablet Take 1 tablet (81 mg total) by mouth daily.    . Coenzyme Q10 300 MG CAPS Take 1 capsule by mouth daily.    . Multiple Vitamin (MULTIVITAMIN WITH MINERALS) TABS tablet Take 1 tablet by mouth daily.    . Omega 3 1000 MG CAPS Take 3 capsules by mouth daily.    . Probiotic Product (PROBIOTIC PO) Take 1 capsule by mouth daily.     No current facility-administered medications for this visit.    Allergies:   Review of patient's allergies indicates no  known allergies.   Social History:  The patient  reports that he has never smoked. He has never used smokeless tobacco. He reports that he drinks alcohol. He reports that he does not use illicit drugs.   Family History:  The patient's  family history includes Cancer in his daughter, father, maternal grandfather, maternal grandmother, mother, paternal grandfather, and paternal grandmother; Colon cancer (age of onset: 76) in his paternal grandmother; Colon cancer (age of onset: 32) in his father; Diabetes in  his father, maternal grandfather, maternal grandmother, mother, paternal grandfather, and paternal grandmother; Heart attack in his maternal grandmother; Hypertension in his maternal grandmother; Stroke in his paternal grandfather.    ROS:  Please see the history of present illness.  All other systems are reviewed and negative.    PHYSICAL EXAM: VS:  BP 140/88 mmHg  Pulse 60  Ht 5\' 11"  (1.803 m)  Wt 197 lb 12.8 oz (89.721 kg)  BMI 27.60 kg/m2 , BMI Body mass index is 27.6 kg/(m^2). GEN: Well nourished, well developed, in no acute distress HEENT: normal Neck: no JVD, no masses. No carotid bruits Cardiac: RRR with 2/6 SEM at the RUSB              Respiratory:  clear to auscultation bilaterally, normal work of breathing GI: soft, nontender, nondistended, + BS MS: no deformity or atrophy Ext: no pretibial edema, pedal pulses 2+= bilaterally Skin: warm and dry, no rash Neuro:  Strength and sensation are intact Psych: euthymic mood, full affect  EKG:  EKG is ordered today. The ekg ordered today shows NSR 61 bpm, cannot rule out age-indeterminate inferior infarct  Recent Labs: 04/21/2015: ALT 20; BUN 19; Creat 1.12; Hemoglobin 13.5; Platelets 206; Potassium 4.8; Sodium 139; TSH 2.451   Lipid Panel     Component Value Date/Time   CHOL 181 04/21/2015 0912   TRIG 63 04/21/2015 0912   HDL 43 04/21/2015 0912   CHOLHDL 4.2 04/21/2015 0912   VLDL 13 04/21/2015 0912   LDLCALC 125 04/21/2015 0912      Wt Readings from Last 3 Encounters:  09/01/15 197 lb 12.8 oz (89.721 kg)  05/06/15 206 lb 14.4 oz (93.849 kg)  04/21/15 205 lb (92.987 kg)    ASSESSMENT AND PLAN: 1.  Aortic valve disease s/p bioprosthetic AVR. NYHA I symptoms. Doing very well. Will update echo. SBE prophylaxis reviewed.   2. HTN, essential: discussed BP at length. He's running 120-130's/85-90 at home, but symptomatic with low-dose antihypertensives. Will continue to monitor as an outpatient for now.  3. Dilated  aorta: FU echo ordered. Dr Guy Sandifer notes reviewed and he plans on a repeat CTA this year.  Current medicines are reviewed with the patient today.  The patient does not have concerns regarding medicines.  Labs/ tests ordered today include:  No orders of the defined types were placed in this encounter.    Disposition:   FU one year  Signed, [name], MD  09/01/2015 4:32 PM    [facility] [address], [place], [place]  [address] Phone: [phone]; Fax: [phone]

## 2015-09-01 NOTE — Patient Instructions (Signed)

## 2015-09-18 ENCOUNTER — Other Ambulatory Visit: Payer: Self-pay

## 2015-09-18 ENCOUNTER — Ambulatory Visit (HOSPITAL_COMMUNITY): Payer: 59 | Attending: Cardiology

## 2015-09-18 DIAGNOSIS — I77810 Thoracic aortic ectasia: Secondary | ICD-10-CM | POA: Insufficient documentation

## 2015-09-18 DIAGNOSIS — Z952 Presence of prosthetic heart valve: Secondary | ICD-10-CM

## 2015-09-18 DIAGNOSIS — I119 Hypertensive heart disease without heart failure: Secondary | ICD-10-CM | POA: Diagnosis not present

## 2015-09-18 DIAGNOSIS — Z954 Presence of other heart-valve replacement: Secondary | ICD-10-CM

## 2015-09-18 DIAGNOSIS — Z953 Presence of xenogenic heart valve: Secondary | ICD-10-CM | POA: Diagnosis not present

## 2015-09-18 DIAGNOSIS — I351 Nonrheumatic aortic (valve) insufficiency: Secondary | ICD-10-CM | POA: Insufficient documentation

## 2015-09-18 DIAGNOSIS — I340 Nonrheumatic mitral (valve) insufficiency: Secondary | ICD-10-CM | POA: Insufficient documentation

## 2015-09-18 DIAGNOSIS — I350 Nonrheumatic aortic (valve) stenosis: Secondary | ICD-10-CM | POA: Insufficient documentation

## 2015-09-18 DIAGNOSIS — I359 Nonrheumatic aortic valve disorder, unspecified: Secondary | ICD-10-CM | POA: Diagnosis present

## 2015-09-18 LAB — ECHOCARDIOGRAM COMPLETE
AOASC: 42 cm
AV pk vel: 299 cm/s
AVCELMEANRAT: 0.38
AVG: 21 mmHg
AVPG: 36 mmHg
Ao pk vel: 0.39 m/s
CHL CUP DOP CALC LVOT VTI: 28.9 cm
DOP CAL AO MEAN VELOCITY: 221 cm/s
E/e' ratio: 7.63
EWDT: 243 ms
FS: 39 % (ref 28–44)
IVS/LV PW RATIO, ED: 1.01
LA ID, A-P, ES: 48 mm
LA diam end sys: 48 mm
LA diam index: 2.25 cm/m2
LA vol A4C: 93.6 ml
LA vol index: 52 mL/m2
LAVOL: 111 mL
LV E/e' medial: 7.63
LV E/e'average: 7.63
LV TDI E'MEDIAL: 7.7
LVELAT: 11 cm/s
LVOT peak VTI: 0.41 cm
LVOT peak grad rest: 5 mmHg
LVOTPV: 117 cm/s
MV Dec: 243
MV Peak grad: 3 mmHg
MV pk E vel: 83.9 m/s
MVPKAVEL: 74.2 m/s
PV Reg grad dias: 7 mmHg
PV Reg vel dias: 132 cm/s
PW: 11.5 mm — AB (ref 0.6–1.1)
RV LATERAL S' VELOCITY: 9.24 cm/s
RV TAPSE: 15.7 mm
RV sys press: 33 mmHg
Reg peak vel: 250 cm/s
TDI e' lateral: 11
TR max vel: 250 cm/s
VTI: 70.1 cm

## 2015-11-18 ENCOUNTER — Other Ambulatory Visit: Payer: Self-pay | Admitting: *Deleted

## 2015-11-18 DIAGNOSIS — I77810 Thoracic aortic ectasia: Secondary | ICD-10-CM

## 2015-12-15 ENCOUNTER — Encounter: Payer: Self-pay | Admitting: Thoracic Surgery (Cardiothoracic Vascular Surgery)

## 2015-12-15 ENCOUNTER — Ambulatory Visit (INDEPENDENT_AMBULATORY_CARE_PROVIDER_SITE_OTHER): Payer: 59 | Admitting: Thoracic Surgery (Cardiothoracic Vascular Surgery)

## 2015-12-15 ENCOUNTER — Ambulatory Visit
Admission: RE | Admit: 2015-12-15 | Discharge: 2015-12-15 | Disposition: A | Discharge status: Home / Self Care | Payer: 59 | Source: Ambulatory Visit | Attending: Thoracic Surgery (Cardiothoracic Vascular Surgery) | Admitting: Thoracic Surgery (Cardiothoracic Vascular Surgery)

## 2015-12-15 VITALS — BP 140/78 | HR 69 | Resp 20 | Ht 71.0 in | Wt 197.0 lb

## 2015-12-15 DIAGNOSIS — I7789 Other specified disorders of arteries and arterioles: Secondary | ICD-10-CM | POA: Diagnosis not present

## 2015-12-15 DIAGNOSIS — I77810 Thoracic aortic ectasia: Secondary | ICD-10-CM

## 2015-12-15 MED ORDER — IOPAMIDOL (ISOVUE-370) INJECTION 76%
75.0000 mL | Freq: Once | INTRAVENOUS | Status: AC | PRN
  Administered 2015-12-15: 75 mL via INTRAVENOUS

## 2015-12-15 NOTE — Progress Notes (Signed)
[address]       [place] [address]             [facility] OFFICE NOTE  Referring Provider is [name], MD PCP is Aundria Mems, MD   HPI:  Patient is a 60 year old male who returns to our office for surveillance of mild fusiform aneurysmal dilatation of the ascending thoracic aorta. He underwent aortic valve replacement using a bioprosthetic tissue valve with closure of patent foramen ovale on 12/05/2013. He was last seen here in our office on 12/09/2014. Since then he has continued to do very well clinically. He has started training for a marathon and is running at least 20-25 miles every week. He reports no exertional shortness of breath whatsoever. He continues to follow-up intermittently with Dr. Burt Knack who saw him most recently on 09/01/2015.  Follow-up echocardiogram performed 09/18/2015 revealed normal left ventricular systolic function with normal functioning bioprosthetic tissue valve in aortic position. Peak velocity across the aortic valve had increased somewhat to 2.9 m/s up from 1.8 m/s on echocardiogram performed early following his surgery.   Current Outpatient Prescriptions  Medication Sig Dispense Refill  . amoxicillin (AMOXIL) 500 MG tablet Take 4 tablets by mouth one hour prior to dental appointment 8 tablet 2  . aspirin 81 MG EC tablet Take 1 tablet (81 mg total) by mouth daily.    . Coenzyme Q10 300 MG CAPS Take 1 capsule by mouth daily.    . Multiple Vitamin (MULTIVITAMIN WITH MINERALS) TABS tablet Take 1 tablet by mouth daily.    . Omega 3 1000 MG CAPS Take 3 capsules by mouth daily.    . Probiotic Product (PROBIOTIC PO) Take 1 capsule by mouth daily.     No current facility-administered medications for this visit.       Physical Exam:   BP 140/78 (BP Location: Right Arm, Cuff Size: Normal)   Pulse 69   Resp 20   Ht 5\' 11"  (1.803 m)   Wt 197 lb (89.4 kg)   SpO2 98% Comment: RA  BMI 27.48  kg/m   General:  Well-appearing  Chest:   Clear to auscultation  CV:   Regular rate and rhythm with soft systolic murmur  Incisions:  n/a  Abdomen:  Soft and nontender  Extremities:  Warm and well-perfused  Diagnostic Tests:  Cardiac TAVR CT  TECHNIQUE: The patient was scanned on a Philips 256 scanner. A 120 kV retrospective scan was triggered in the descending thoracic aorta at 111 HU's. Gantry rotation speed was 270 msecs and collimation was .9 mm. No beta blockade or nitro were given. The 3D data set was reconstructed in 5% intervals of the R-R cycle. Systolic and diastolic phases were analyzed on a dedicated work station using MPR, MIP and VRT modes. The patient received 80 cc of contrast.  FINDINGS: Aortic Valve: Trileaflet aortic valve with partially fused right and non-coronary leaflets - functionally bileaflet valve. Severely calcified leaflets with severely restricted leaflet opening.  Aorta: Aortic root, sinotubular junction and ascending thoracic arch are mildly dilated. There is no dissection, no calcifications.  Sinotubular Junction:  34 x 34 mm  Ascending Thoracic Aorta:  40 x 40 mm  Aortic Arch:  33 x 32 mm  Descending Thoracic Aorta:  33 x 32 mm  Sinus of Valsalva Measurements:  Non-coronary:  39 mm  Right -coronary:  37 mm  Left -coronary:  39 mm  Coronary Artery Height  above Annulus:  Left Main:  14 mm  Right Coronary:  20 mm  Virtual Basal Annulus Measurements:  Maximum/Minimum Diameter:  34 x 28 mm  Perimeter:  106 mm  Area:  752 mm2  Coronary Arteries: Originating in normal position. Right dominance. No significant plaque.  IMPRESSION: 1. Functionally bicuspid aortic valve with severely calcified leaflets and severely limited leaflet opening. Aortic annular measurements exceed currently available Edward-Sapien XT TAVR.  2. Aortic root, sinotubular junction and ascending thoracic arch are mildly  dilated.  Ena Dawley   Electronically Signed   By: Ena Dawley   On: 11/14/2013 19:52   Transthoracic Echocardiography  Patient:    Patrick Walter, Patrick Walter MR #:       [id number] Study Date: 09/18/2015 Gender:     M Age:        78 Height:     180.3 cm Weight:     89.7 kg BSA:        2.14 m^2 Pt. Status: Room:   Meribeth Mattes, MD  REFERRING    [name], MD  ATTENDING    Loralie Champagne, M.D.  SONOGRAPHER  Wyatt Mage, RDCS  PERFORMING   [facility], Outpatient  cc:  ------------------------------------------------------------------- LV EF: 55% -   60%  ------------------------------------------------------------------- Indications:      AVR (Z95.4).  ------------------------------------------------------------------- History:   Risk factors:  Enlarged ascending aorta. Hypertension.   ------------------------------------------------------------------- Study Conclusions  - Left ventricle: The cavity size was normal. Wall thickness was   increased in a pattern of mild LVH. Systolic function was normal.   The estimated ejection fraction was in the range of 55% to 60%.   Wall motion was normal; there were no regional wall motion   abnormalities. Features are consistent with a pseudonormal left   ventricular filling pattern, with concomitant abnormal relaxation   and increased filling pressure (grade 2 diastolic dysfunction). - Aortic valve: There was a bioprosthetic aortic valve. The valve   leaflets were not well-visualized, mean gradient was higher on   this study than in the past, likely in the mildly stenotic range   for this valve. There was trivial regurgitation. Mean gradient   (S): 21 mm Hg. Peak gradient (S): 36 mm Hg. - Aorta: Mildly dilated aortic root and ascending aorta. Aortic   root dimension: 38 mm (ED). Ascending aortic diameter: 42 mm (S). - Mitral valve: Mildly calcified annulus. There was trivial   regurgitation. - Left  atrium: The atrium was mildly dilated. - Right ventricle: The cavity size was normal. Systolic function   was normal. - Right atrium: The atrium was mildly dilated. - Atrial septum: Atrial septal closure device was in place. No   evident flow across the device. - Tricuspid valve: Peak RV-RA gradient (S): 25 mm Hg. - Pulmonary arteries: PA peak pressure: 33 mm Hg (S). - Systemic veins: IVC measured 1.9 cm with < 50% respirophasic   variation, suggesting RA pressure 8 mmHg.  Impressions:  - Normal LV size with mild LV hypertrophy. EF 55-60%. Moderate   diastolic dysfunction. Normal RV size and systolic function.   Biatrial enlargement. Bioprosthetic aortic valve with mean   gradient 21 mmHg. This is higher than on the past study, possibly   representing a mild degree of bioprosthetic valve stenosis.   Atrial septal closure device in place with no evident leak.  ------------------------------------------------------------------- Labs, prior tests, procedures, and surgery: Transthoracic echocardiography (01/16/2014).     EF was 60%. Aortic valve: peak gradient  of 26 mm Hg and mean gradient of 15 mm Hg. Ascending aorta: 83mm.  Valve surgery.     Aortic valve replacement with a bioprosthetic valve. Intracardiac repair.    PFO closure. Transthoracic echocardiography.  M-mode, complete 2D, spectral Doppler, and color Doppler.  Birthdate:  Patient birthdate: [birth year].  Age:  Patient is 60 yr old.  Sex:  Gender: male. BMI: 27.6 kg/m^2.  Blood pressure:     140/88  Patient status: Outpatient.  Study date:  Study date: 09/18/2015. Study time: 03:56 PM.  Location:  [facility]  -------------------------------------------------------------------  ------------------------------------------------------------------- Left ventricle:  The cavity size was normal. Wall thickness was increased in a pattern of mild LVH. Systolic function was normal. The estimated ejection fraction was  in the range of 55% to 60%. Wall motion was normal; there were no regional wall motion abnormalities. Features are consistent with a pseudonormal left ventricular filling pattern, with concomitant abnormal relaxation and increased filling pressure (grade 2 diastolic dysfunction).  ------------------------------------------------------------------- Aortic valve:  There was a bioprosthetic aortic valve. The valve leaflets were not well-visualized, mean gradient was higher on this study than in the past, likely in the mildly stenotic range for this valve.  Doppler:  There was trivial regurgitation.    VTI ratio of LVOT to aortic valve: 0.41. Peak velocity ratio of LVOT to aortic valve: 0.39. Mean velocity ratio of LVOT to aortic valve: 0.38.    Mean gradient (S): 21 mm Hg. Peak gradient (S): 36 mm Hg.   ------------------------------------------------------------------- Aorta:  Mildly dilated aortic root and ascending aorta.  ------------------------------------------------------------------- Mitral valve:   Mildly calcified annulus.  Doppler:   There was no evidence for stenosis.   There was trivial regurgitation.    Peak gradient (D): 3 mm Hg.  ------------------------------------------------------------------- Left atrium:  The atrium was mildly dilated.  ------------------------------------------------------------------- Atrial septum:  Atrial septal closure device was in place. No evident flow across the device.  ------------------------------------------------------------------- Right ventricle:  The cavity size was normal. Systolic function was normal.  ------------------------------------------------------------------- Pulmonic valve:    Structurally normal valve.   Cusp separation was normal.  Doppler:  Transvalvular velocity was within the normal range. There was trivial regurgitation.  ------------------------------------------------------------------- Tricuspid  valve:   Doppler:  There was trivial regurgitation.   ------------------------------------------------------------------- Right atrium:  The atrium was mildly dilated.  ------------------------------------------------------------------- Pericardium:  There was no pericardial effusion.  ------------------------------------------------------------------- Systemic veins:  IVC measured 1.9 cm with < 50% respirophasic variation, suggesting RA pressure 8 mmHg.  ------------------------------------------------------------------- Post procedure conclusions Ascending Aorta:  - Mildly dilated aortic root and ascending aorta.  ------------------------------------------------------------------- Measurements   Left ventricle                         Value        Reference  LV ID, ED, PLAX chordal        (H)     54.7  mm     43 - 52  LV ID, ES, PLAX chordal                33.4  mm     23 - 38  LV fx shortening, PLAX chordal         39    %      >=29  LV PW thickness, ED                    11.5  mm     ---------  IVS/LV PW ratio, ED                    1.01         <=1.3  LV e&', lateral                         11    cm/s   ---------  LV E/e&', lateral                       7.63         ---------  LV e&', medial                          7.7   cm/s   ---------  LV E/e&', medial                        10.9         ---------  LV e&', average                         9.35  cm/s   ---------  LV E/e&', average                       8.97         ---------    Ventricular septum                     Value        Reference  IVS thickness, ED                      11.6  mm     ---------    LVOT                                   Value        Reference  LVOT peak velocity, S                  117   cm/s   ---------  LVOT mean velocity, S                  84.7  cm/s   ---------  LVOT VTI, S                            28.9  cm     ---------  LVOT peak gradient, S                  5     mm Hg  ---------     Aortic valve                           Value        Reference  Aortic valve peak velocity, S          299   cm/s   ---------  Aortic valve mean velocity, S          221   cm/s   ---------  Aortic valve VTI, S                    70.1  cm     ---------  Aortic mean gradient, S                21    mm Hg  ---------  Aortic peak gradient, S                36    mm Hg  ---------  VTI ratio, LVOT/AV                     0.41         ---------  Velocity ratio, peak, LVOT/AV          0.39         ---------  Velocity ratio, mean, LVOT/AV          0.38         ---------    Aorta                                  Value        Reference  Aortic root ID, ED                     38    mm     ---------  Ascending aorta ID, A-P, S             42    mm     ---------    Left atrium                            Value        Reference  LA ID, A-P, ES                         48    mm     ---------  LA ID/bsa, A-P                 (H)     2.25  cm/m^2 <=2.2  LA volume, S                           111   ml     ---------  LA volume/bsa, S                       52    ml/m^2 ---------  LA volume, ES, 1-p A4C                 93.6  ml     ---------  LA volume/bsa, ES, 1-p A4C             43.8  ml/m^2 ---------  LA volume, ES, 1-p A2C                 125   ml     ---------  LA volume/bsa, ES, 1-p A2C             58.5  ml/m^2 ---------    Mitral valve                           Value        Reference  Mitral E-wave peak velocity            83.9  cm/s   ---------  Mitral A-wave peak velocity            74.2  cm/s   ---------  Mitral deceleration time       (H)     243   ms     150 - 230  Mitral peak gradient, D                3     mm Hg  ---------  Mitral E/A ratio, peak                 1.1          ---------    Pulmonary arteries                     Value        Reference  PA pressure, S, DP             (H)     33    mm Hg  <=30    Tricuspid valve                        Value        Reference  Tricuspid regurg  peak velocity         250   cm/s   ---------  Tricuspid peak RV-RA gradient          25    mm Hg  ---------    Systemic veins                         Value        Reference  Estimated CVP                          8     mm Hg  ---------    Right ventricle                        Value        Reference  TAPSE                                  15.7  mm     ---------  RV s&', lateral, S                      9.24  cm/s   ---------    Pulmonic valve                         Value        Reference  Pulmonic regurg velocity, ED           132   cm/s   ---------  Pulmonic regurg gradient, ED           7     mm Hg  ---------  Legend: (L)  and  (H)  mark values outside specified reference range.  ------------------------------------------------------------------- Prepared and Electronically Authenticated by  Loralie Champagne, M.D. 2017-06-29T18:01:01    CT CHEST WITH CONTRAST  TECHNIQUE: Multidetector CT imaging of the chest was performed during intravenous contrast administration.  CONTRAST:  75 cc Isovue 370  COMPARISON:  Cardiac CT 11/14/2013  FINDINGS: Cardiovascular: Heart is enlarged. No pericardial fluid/ thickening.  Surgical changes of prior median sternotomy  and aortic valve replacement.  Calcifications of the left anterior descending, right coronary arteries.  Greatest diameter of the ascending thoracic aorta measures approximately 4.3 cm. No significant atherosclerotic disease of the branch vessels. Three vessel arch.  Greatest diameter of the descending thoracic aorta measures 3.3 cm. No significant atherosclerotic changes.  No dissection or periaortic fluid.  Decrease sensitivity for detection of pulmonary emboli given the timing of the bolus.  Mediastinum/Nodes: No enlarged mediastinal, hilar, or axillary lymph nodes. Thyroid gland, trachea, and esophagus demonstrate no significant findings.  Lungs/Pleura: Lungs are clear. No pleural effusion or  pneumothorax.  Upper Abdomen: No acute abnormality.  Musculoskeletal: No chest wall abnormality. No acute or significant osseous findings.  IMPRESSION: No acute finding.  Greatest diameter of the ascending aorta measures approximately 4.3 cm.  Recommend annual imaging followup by CTA or MRA. This recommendation follows 2010 ACCF/AHA/AATS/ACR/ASA/SCA/SCAI/SIR/STS/SVM Guidelines for the Diagnosis and Management of Patients with Thoracic Aortic Disease. Circulation. 2010; 121: [id number]  Surgical changes of prior median sternotomy and aortic valve repair.  Cardiomegaly.  Two vessel coronary artery disease.  Signed,  Dulcy Fanny. Earleen Newport, DO  [facility]  [facility]   Electronically Signed   By: Corrie Mckusick D.O.   On: 12/15/2015 16:34    Impression:  I have personally reviewed the follow-up CT angiogram performed earlier today and compared it with the cardiac gated CT angiogram performed prior to the patient's aortic valve replacement in 2015. The patient has mild fusiform enlargement of the ascending thoracic aorta.  This scan performed today was not cardiac gated and clearly has motion artifact. This was not reported by the interpreting radiologist, but I believe this clearly accounts for the 3 mm difference noted between the 2 scans. In my opinion I doubt there has been any significant change over the past 2 years. I am more concerned by the change in the transvalvular gradient noted on follow-up echocardiogram performed several months ago. However, this may simply be flow related as leaflet motion appeared normal.  The patient's blood pressure is slightly high today but he states that he experiences "white coat syndrome" and his blood pressure typically runs considerably lower when he checks it at home.   Plan:  We have not recommended any changes to his current medications. The patient will return in 1 year for  follow-up CT angiogram. Under the circumstances it may be prudent to get a cardiac gated scan to allow more accurate measurement.  The patient will continue to follow-up with Dr. Burt Knack who plans a follow-up echocardiogram next year.   I spent in excess of 15 minutes during the conduct of this office consultation and >50% of this time involved direct face-to-face encounter with the patient for counseling and/or coordination of their care.    Valentina Gu. Roxy Manns, MD 12/15/2015 4:53 PM

## 2015-12-15 NOTE — Patient Instructions (Signed)
Continue all previous medications without any changes at this time  

## 2016-04-10 DIAGNOSIS — Z23 Encounter for immunization: Secondary | ICD-10-CM | POA: Diagnosis not present

## 2016-06-29 ENCOUNTER — Encounter: Payer: Self-pay | Admitting: Sports Medicine

## 2016-07-01 ENCOUNTER — Ambulatory Visit (INDEPENDENT_AMBULATORY_CARE_PROVIDER_SITE_OTHER): Payer: 59

## 2016-07-01 ENCOUNTER — Ambulatory Visit (INDEPENDENT_AMBULATORY_CARE_PROVIDER_SITE_OTHER): Payer: 59 | Admitting: Sports Medicine

## 2016-07-01 ENCOUNTER — Encounter: Payer: Self-pay | Admitting: Sports Medicine

## 2016-07-01 DIAGNOSIS — M25552 Pain in left hip: Secondary | ICD-10-CM

## 2016-07-01 DIAGNOSIS — Z809 Family history of malignant neoplasm, unspecified: Secondary | ICD-10-CM

## 2016-07-01 DIAGNOSIS — Z0000 Encounter for general adult medical examination without abnormal findings: Secondary | ICD-10-CM

## 2016-07-01 DIAGNOSIS — M1612 Unilateral primary osteoarthritis, left hip: Secondary | ICD-10-CM | POA: Insufficient documentation

## 2016-07-01 DIAGNOSIS — I10 Essential (primary) hypertension: Secondary | ICD-10-CM | POA: Diagnosis not present

## 2016-07-01 DIAGNOSIS — Z808 Family history of malignant neoplasm of other organs or systems: Secondary | ICD-10-CM | POA: Diagnosis not present

## 2016-07-01 DIAGNOSIS — M160 Bilateral primary osteoarthritis of hip: Secondary | ICD-10-CM

## 2016-07-01 DIAGNOSIS — I7789 Other specified disorders of arteries and arterioles: Secondary | ICD-10-CM | POA: Diagnosis not present

## 2016-07-01 MED ORDER — CELECOXIB 200 MG PO CAPS: ORAL_CAPSULE | ORAL | 2 refills | Status: DC

## 2016-07-01 NOTE — Assessment & Plan Note (Signed)
In a runner, differential includes labral tear, hip osteoarthritis and femoral neck stress injury. X-rays, switching to Celebrex, formal physical therapy. Patient desires to use Patrick Walter, DC. Return to see me in one month, MRI arthrogram if no better.

## 2016-07-01 NOTE — Assessment & Plan Note (Signed)
Unremarkable physical, routine blood work ordered

## 2016-07-01 NOTE — Assessment & Plan Note (Signed)
Well-controlled, return as needed for this.

## 2016-07-01 NOTE — Progress Notes (Signed)
  Subjective:    CC: Annual physical exam  HPI:  Patrick Walter is here for his physical, he feels well with the exception of left hip pain, localized at the groin, moderate, persistent without radiation, occasional mechanical symptoms. Pain has been present for 3-4 months, worse after running a marathon.  Past medical history:  Negative.  See flowsheet/record as well for more information.  Surgical history: Negative.  See flowsheet/record as well for more information.  Family history: Negative.  See flowsheet/record as well for more information.  Social history: Negative.  See flowsheet/record as well for more information.  Allergies, and medications have been entered into the medical record, reviewed, and no changes needed.    Review of Systems: No headache, visual changes, nausea, vomiting, diarrhea, constipation, dizziness, abdominal pain, skin rash, fevers, chills, night sweats, swollen lymph nodes, weight loss, chest pain, body aches, joint swelling, muscle aches, shortness of breath, mood changes, visual or auditory hallucinations.  Objective:    General: Well Developed, well nourished, and in no acute distress.  Neuro: Alert and oriented x3, extra-ocular muscles intact, sensation grossly intact. Cranial nerves II through XII are intact, motor, sensory, and coordinative functions are all intact. HEENT: Normocephalic, atraumatic, pupils equal round reactive to light, neck supple, no masses, no lymphadenopathy, thyroid nonpalpable. Oropharynx, nasopharynx, external ear canals are unremarkable. Skin: Warm and dry, no rashes noted.  Cardiac: Regular rate and rhythm, no murmurs rubs or gallops.  Respiratory: Clear to auscultation bilaterally. Not using accessory muscles, speaking in full sentences.  Abdominal: Soft, nontender, nondistended, positive bowel sounds, no masses, no organomegaly.  Left Hip: ROM IR: 20 with pain, ER: 60 Deg, Flexion: 120 Deg, Extension: 100 Deg, Abduction: 45 Deg,  Adduction: 45 Deg Strength IR: 5/5, ER: 5/5, Flexion: 4/5, Extension: 5/5, Abduction: 5/5, Adduction: 5/5 Pelvic alignment unremarkable to inspection and palpation. Standing hip rotation and gait without trendelenburg / unsteadiness. Greater trochanter without tenderness to palpation. No tenderness over piriformis. No SI joint tenderness and normal minimal SI movement.  Impression and Recommendations:    The patient was counselled, risk factors were discussed, anticipatory guidance given.  Annual physical exam Unremarkable physical, routine blood work ordered  Essential hypertension, benign Well-controlled, return as needed for this.  Left hip pain In a runner, differential includes labral tear, hip osteoarthritis and femoral neck stress injury. X-rays, switching to Celebrex, formal physical therapy. Patient desires to use Patrick Walter, DC. Return to see me in one month, MRI arthrogram if no better.

## 2016-07-02 ENCOUNTER — Encounter: Payer: Self-pay | Admitting: Sports Medicine

## 2016-07-02 LAB — COMPREHENSIVE METABOLIC PANEL
ALT: 17 U/L (ref 9–46)
Alkaline Phosphatase: 57 U/L (ref 40–115)
BUN: 23 mg/dL (ref 7–25)
Creat: 1.03 mg/dL (ref 0.70–1.33)
Glucose, Bld: 91 mg/dL (ref 65–99)
Potassium: 4.4 mmol/L (ref 3.5–5.3)
Sodium: 141 mmol/L (ref 135–146)
Total Protein: 6.6 g/dL (ref 6.1–8.1)

## 2016-07-02 LAB — COMPREHENSIVE METABOLIC PANEL WITH GFR
AST: 22 U/L (ref 10–35)
Albumin: 4.1 g/dL (ref 3.6–5.1)
CO2: 26 mmol/L (ref 20–31)
Calcium: 10 mg/dL (ref 8.6–10.3)
Chloride: 105 mmol/L (ref 98–110)
Total Bilirubin: 0.8 mg/dL (ref 0.2–1.2)

## 2016-07-02 LAB — LIPID PANEL W/REFLEX DIRECT LDL
Cholesterol: 176 mg/dL (ref <200)
HDL: 56 mg/dL (ref >40)
LDL-Cholesterol: 107 mg/dL — ABNORMAL HIGH
Non-HDL Cholesterol (Calc): 120 mg/dL (ref <130)
Total CHOL/HDL Ratio: 3.1 ratio (ref <5.0)
Triglycerides: 45 mg/dL (ref <150)

## 2016-07-02 LAB — CBC
HCT: 43.6 % (ref 38.5–50.0)
Hemoglobin: 14.5 g/dL (ref 13.2–17.1)
MCH: 28.8 pg (ref 27.0–33.0)
MCHC: 33.3 g/dL (ref 32.0–36.0)
MCV: 86.5 fL (ref 80.0–100.0)
MPV: 11.4 fL (ref 7.5–12.5)
Platelets: 201 K/uL (ref 140–400)
RBC: 5.04 MIL/uL (ref 4.20–5.80)
RDW: 13.7 % (ref 11.0–15.0)
WBC: 4.7 10*3/uL (ref 3.8–10.8)

## 2016-07-03 LAB — VITAMIN D 25 HYDROXY (VIT D DEFICIENCY, FRACTURES): Vit D, 25-Hydroxy: 35 ng/mL (ref 30–100)

## 2016-07-03 LAB — HIV ANTIBODY (ROUTINE TESTING W REFLEX): HIV 1&2 Ab, 4th Generation: NONREACTIVE

## 2016-07-03 LAB — HEMOGLOBIN A1C
Hgb A1c MFr Bld: 5 % (ref <5.7)
Mean Plasma Glucose: 97 mg/dL

## 2016-07-03 LAB — HEPATITIS C ANTIBODY: HCV Ab: NEGATIVE

## 2016-07-15 DIAGNOSIS — M5136 Other intervertebral disc degeneration, lumbar region: Secondary | ICD-10-CM | POA: Diagnosis not present

## 2016-07-15 DIAGNOSIS — M9903 Segmental and somatic dysfunction of lumbar region: Secondary | ICD-10-CM | POA: Diagnosis not present

## 2016-07-15 DIAGNOSIS — M9904 Segmental and somatic dysfunction of sacral region: Secondary | ICD-10-CM | POA: Diagnosis not present

## 2016-07-22 DIAGNOSIS — M9904 Segmental and somatic dysfunction of sacral region: Secondary | ICD-10-CM | POA: Diagnosis not present

## 2016-07-22 DIAGNOSIS — M9903 Segmental and somatic dysfunction of lumbar region: Secondary | ICD-10-CM | POA: Diagnosis not present

## 2016-07-22 DIAGNOSIS — M5136 Other intervertebral disc degeneration, lumbar region: Secondary | ICD-10-CM | POA: Diagnosis not present

## 2016-07-28 DIAGNOSIS — M9903 Segmental and somatic dysfunction of lumbar region: Secondary | ICD-10-CM | POA: Diagnosis not present

## 2016-07-28 DIAGNOSIS — M9904 Segmental and somatic dysfunction of sacral region: Secondary | ICD-10-CM | POA: Diagnosis not present

## 2016-07-28 DIAGNOSIS — M5136 Other intervertebral disc degeneration, lumbar region: Secondary | ICD-10-CM | POA: Diagnosis not present

## 2016-08-05 ENCOUNTER — Encounter: Payer: Self-pay | Admitting: Sports Medicine

## 2016-08-05 ENCOUNTER — Ambulatory Visit (INDEPENDENT_AMBULATORY_CARE_PROVIDER_SITE_OTHER): Payer: 59 | Admitting: Sports Medicine

## 2016-08-05 DIAGNOSIS — M25552 Pain in left hip: Secondary | ICD-10-CM | POA: Diagnosis not present

## 2016-08-05 NOTE — Assessment & Plan Note (Signed)
Osteoarthritis and CAM type anatomy on x-rays. At this point only had meager improvement with physical therapy and chiropractic manipulation as well as stretches. Because he does have mechanical symptoms we are going to get an MRI arthrogram which could be diagnostic and therapeutic. Return for arthrogram injection.

## 2016-08-05 NOTE — Progress Notes (Signed)
  Subjective:    CC: Follow-up  HPI: Left hip:  X-rays in the recent past showed osteoarthritis, has persistent left groin pain, he did go to the chiropractor, didn't notice much improvement, Celebrex also didn't provide a whole lot of benefit. Pain is moderate, persistent, localized at the anterior joint line with mechanical symptoms.  Past medical history:  Negative.  See flowsheet/record as well for more information.  Surgical history: Negative.  See flowsheet/record as well for more information.  Family history: Negative.  See flowsheet/record as well for more information.  Social history: Negative.  See flowsheet/record as well for more information.  Allergies, and medications have been entered into the medical record, reviewed, and no changes needed.   Review of Systems: No fevers, chills, night sweats, weight loss, chest pain, or shortness of breath.   Objective:    General: Well Developed, well nourished, and in no acute distress.  Neuro: Alert and oriented x3, extra-ocular muscles intact, sensation grossly intact.  HEENT: Normocephalic, atraumatic, pupils equal round reactive to light, neck supple, no masses, no lymphadenopathy, thyroid nonpalpable.  Skin: Warm and dry, no rashes. Cardiac: Regular rate and rhythm, no murmurs rubs or gallops, no lower extremity edema.  Respiratory: Clear to auscultation bilaterally. Not using accessory muscles, speaking in full sentences.  Impression and Recommendations:    Left hip pain Osteoarthritis and CAM type anatomy on x-rays. At this point only had meager improvement with physical therapy and chiropractic manipulation as well as stretches. Because he does have mechanical symptoms we are going to get an MRI arthrogram which could be diagnostic and therapeutic. Return for arthrogram injection.  I spent 25 minutes with this patient, greater than 50% was face-to-face time counseling regarding the above diagnoses

## 2016-08-06 DIAGNOSIS — M5136 Other intervertebral disc degeneration, lumbar region: Secondary | ICD-10-CM | POA: Diagnosis not present

## 2016-08-06 DIAGNOSIS — M9903 Segmental and somatic dysfunction of lumbar region: Secondary | ICD-10-CM | POA: Diagnosis not present

## 2016-08-06 DIAGNOSIS — M9904 Segmental and somatic dysfunction of sacral region: Secondary | ICD-10-CM | POA: Diagnosis not present

## 2016-08-10 DIAGNOSIS — M9904 Segmental and somatic dysfunction of sacral region: Secondary | ICD-10-CM | POA: Diagnosis not present

## 2016-08-10 DIAGNOSIS — M5136 Other intervertebral disc degeneration, lumbar region: Secondary | ICD-10-CM | POA: Diagnosis not present

## 2016-08-10 DIAGNOSIS — M9903 Segmental and somatic dysfunction of lumbar region: Secondary | ICD-10-CM | POA: Diagnosis not present

## 2016-08-17 ENCOUNTER — Encounter: Payer: Self-pay | Admitting: Sports Medicine

## 2016-08-17 ENCOUNTER — Ambulatory Visit (INDEPENDENT_AMBULATORY_CARE_PROVIDER_SITE_OTHER): Payer: 59 | Admitting: Sports Medicine

## 2016-08-17 ENCOUNTER — Ambulatory Visit (INDEPENDENT_AMBULATORY_CARE_PROVIDER_SITE_OTHER): Payer: 59

## 2016-08-17 DIAGNOSIS — X58XXXA Exposure to other specified factors, initial encounter: Secondary | ICD-10-CM

## 2016-08-17 DIAGNOSIS — M769 Unspecified enthesopathy, lower limb, excluding foot: Secondary | ICD-10-CM | POA: Diagnosis not present

## 2016-08-17 DIAGNOSIS — S73192A Other sprain of left hip, initial encounter: Secondary | ICD-10-CM | POA: Diagnosis not present

## 2016-08-17 DIAGNOSIS — M25552 Pain in left hip: Secondary | ICD-10-CM | POA: Diagnosis not present

## 2016-08-17 NOTE — Addendum Note (Signed)
Addended by: [name] on: 08/17/2016 11:00 AM   Modules accepted: Level of Service

## 2016-08-17 NOTE — Assessment & Plan Note (Signed)
Osteoarthritis and cam-type anatomy on x-rays. Did not improve with physical therapy, chiropractic manipulation, stretching. We did the injection today for MR arthrogram, further follow-up will depend on MRI arthrogram results.

## 2016-08-17 NOTE — Progress Notes (Addendum)
   Procedure: Real-time Ultrasound Guided gadolinium contrast injection of left hip joint Device: GE Logiq E  Verbal informed consent obtained.  Time-out conducted.  Noted no overlying erythema, induration, or other signs of local infection.  Skin prepped in a sterile fashion.  Local anesthesia: Topical Ethyl chloride.  With sterile technique and under real time ultrasound guidance:  1 mL Kenalog 40, 2 mL lidocaine, 2 mL bupivacaine injected easily into the hip joint at the femoral head/neck junction, syringe switched and 0.1 mL gadolinium injected, syringe again switched and 10 mL sterile saline used to flush needle into the joint. Joint visualized and capsule seen distending confirming intra-articular placement of contrast material and medication. Completed without difficulty  Advised to call if fevers/chills, erythema, induration, drainage, or persistent bleeding.  Images permanently stored and available for review in the ultrasound unit.  Impression: Technically successful ultrasound guided gadolinium contrast injection for MR arthrography.  Please see separate MR arthrogram report.

## 2016-08-18 ENCOUNTER — Encounter: Payer: Self-pay | Admitting: Sports Medicine

## 2016-08-18 ENCOUNTER — Encounter: Payer: Self-pay | Admitting: Cardiovascular Disease

## 2016-08-19 DIAGNOSIS — M9904 Segmental and somatic dysfunction of sacral region: Secondary | ICD-10-CM | POA: Diagnosis not present

## 2016-08-19 DIAGNOSIS — M5136 Other intervertebral disc degeneration, lumbar region: Secondary | ICD-10-CM | POA: Diagnosis not present

## 2016-08-19 DIAGNOSIS — M9903 Segmental and somatic dysfunction of lumbar region: Secondary | ICD-10-CM | POA: Diagnosis not present

## 2016-08-26 DIAGNOSIS — M5136 Other intervertebral disc degeneration, lumbar region: Secondary | ICD-10-CM | POA: Diagnosis not present

## 2016-08-26 DIAGNOSIS — M9904 Segmental and somatic dysfunction of sacral region: Secondary | ICD-10-CM | POA: Diagnosis not present

## 2016-08-26 DIAGNOSIS — M9903 Segmental and somatic dysfunction of lumbar region: Secondary | ICD-10-CM | POA: Diagnosis not present

## 2016-09-02 DIAGNOSIS — M9904 Segmental and somatic dysfunction of sacral region: Secondary | ICD-10-CM | POA: Diagnosis not present

## 2016-09-02 DIAGNOSIS — M9903 Segmental and somatic dysfunction of lumbar region: Secondary | ICD-10-CM | POA: Diagnosis not present

## 2016-09-02 DIAGNOSIS — M5136 Other intervertebral disc degeneration, lumbar region: Secondary | ICD-10-CM | POA: Diagnosis not present

## 2016-09-06 ENCOUNTER — Encounter: Payer: Self-pay | Admitting: Cardiovascular Disease

## 2016-09-06 ENCOUNTER — Ambulatory Visit (INDEPENDENT_AMBULATORY_CARE_PROVIDER_SITE_OTHER): Payer: 59 | Admitting: Cardiovascular Disease

## 2016-09-06 VITALS — BP 146/78 | HR 64 | Ht 71.0 in | Wt 196.8 lb

## 2016-09-06 DIAGNOSIS — I350 Nonrheumatic aortic (valve) stenosis: Secondary | ICD-10-CM | POA: Diagnosis not present

## 2016-09-06 DIAGNOSIS — Z952 Presence of prosthetic heart valve: Secondary | ICD-10-CM | POA: Diagnosis not present

## 2016-09-06 NOTE — Progress Notes (Signed)
Cardiology Office Note Date:  09/06/2016   ID:  Patrick Walter, DOB 05/02/1955, MRN [id number]  PCP:  [name], MD  Cardiologist:  [name], MD    Chief Complaint  Patient presents with  . aortic valve disorder    s/p AVR     History of Present Illness: Patrick Walter is a 61 y.o. male who presents for follow-up of aortic valve disease. The patient has a history of severe bicuspid aortic valve stenosis and he underwent bioprosthetic aortic valve replacement in 2015.  The patient is here alone today. He is doing very well. He ran the [employer] earlier this year. He's injured his hip from doing so much running but otherwise he has no complaints. He specifically denies chest pain, chest pressure, shortness of breath, heart palpitations, or lightheadedness. Home blood pressures have been ranging 115 - 125 over 60 - 70s. He has a history of hypertension.   Past Medical History:  Diagnosis Date  . Aortic stenosis 07/24/2013  . Ascending aorta enlargement ([place])   . Bicuspid aortic valve   . Coronary artery disease   . Heart murmur   . Hx of echocardiogram    post AVR >> Echo (10/15):  Mild LVH, mod focal basal septal hypertrophy, EF 55-60%, Gr 1 DD, AVR ok (mean 15 mmHg), dilated ascending Aorta (44 mm), mod LAE  . Hypertension   . Patent foramen ovale 10/23/2013   Closed at the time of aortic valve replacement  . S/P aortic valve replacement with bioprosthetic valve 12/05/2013   25 mm [facility] Ease bovine pericardial tissue valve     Past Surgical History:  Procedure Laterality Date  . AORTIC VALVE REPLACEMENT N/A 12/05/2013   Procedure: AORTIC VALVE REPLACEMENT (AVR);  Surgeon: [name], MD;  Location: [place];  Service: Open Heart Surgery;  Laterality: N/A;  . HAND SURGERY Left 07/1977  . INTRAOPERATIVE TRANSESOPHAGEAL ECHOCARDIOGRAM N/A 12/05/2013   Procedure: INTRAOPERATIVE TRANSESOPHAGEAL ECHOCARDIOGRAM;  Surgeon: [name], MD;   Location: [place];  Service: Open Heart Surgery;  Laterality: N/A;  . LEFT AND RIGHT HEART CATHETERIZATION WITH CORONARY ANGIOGRAM N/A 11/08/2013   Procedure: LEFT AND RIGHT HEART CATHETERIZATION WITH CORONARY ANGIOGRAM;  Surgeon: Blane Ohara, MD;  Location: [facility] CATH LAB;  Service: Cardiovascular;  Laterality: N/A;  . PATENT FORAMEN OVALE CLOSURE N/A 12/05/2013   Procedure: PATENT FORAMEN OVALE CLOSURE;  Surgeon: [name], MD;  Location: [place];  Service: Open Heart Surgery;  Laterality: N/A;  . TEE WITHOUT CARDIOVERSION N/A 10/23/2013   Procedure: TRANSESOPHAGEAL ECHOCARDIOGRAM (TEE);  Surgeon: Larey Dresser, MD;  Location: [facility] ENDOSCOPY;  Service: Cardiovascular;  Laterality: N/A;  . VASECTOMY  06/2002    Current Outpatient Prescriptions  Medication Sig Dispense Refill  . aspirin 81 MG EC tablet Take 1 tablet (81 mg total) by mouth daily.    . Boswellia-Glucosamine-Vit D (GLUCOSAMINE COMPLEX PO) Take 1 capsule by mouth daily.    . celecoxib (CELEBREX) 200 MG capsule One to 2 tablets by mouth daily as needed for pain. 60 capsule 2  . Coenzyme Q10 300 MG CAPS Take 1 capsule by mouth daily.    . Multiple Vitamin (MULTIVITAMIN WITH MINERALS) TABS tablet Take 1 tablet by mouth daily.    . Omega 3 1000 MG CAPS Take 3 capsules by mouth daily.    . Probiotic Product (PROBIOTIC PO) Take 1 capsule by mouth daily.    . TURMERIC PO Take 1 capsule by mouth daily.  No current facility-administered medications for this visit.     Allergies:   Patient has no known allergies.   Social History:  The patient  reports that he has never smoked. He has never used smokeless tobacco. He reports that he drinks alcohol. He reports that he does not use drugs.   Family History:  The patient's family history includes Cancer in his daughter, father, maternal grandfather, maternal grandmother, mother, paternal grandfather, and paternal grandmother; Colon cancer (age of onset: 64) in his paternal grandmother;  Colon cancer (age of onset: 67) in his father; Diabetes in his father, maternal grandfather, maternal grandmother, mother, paternal grandfather, and paternal grandmother; Heart attack in his maternal grandmother; Hypertension in his maternal grandmother; Stroke in his paternal grandfather.   ROS:  Please see the history of present illness.  All other systems are reviewed and negative.   PHYSICAL EXAM: VS:  BP (!) 146/78   Pulse 64   Ht 5\' 11"  (1.803 m)   Wt 196 lb 12.8 oz (89.3 kg)   BMI 27.45 kg/m  , BMI Body mass index is 27.45 kg/m. GEN: Well nourished, well developed, in no acute distress  HEENT: normal  Neck: no JVD, no masses. No carotid bruits Cardiac: RRR with 2/6 systolic murmur at the RUSB, early peaking          Respiratory:  clear to auscultation bilaterally, normal work of breathing GI: soft, nontender, nondistended, + BS MS: no deformity or atrophy  Ext: no pretibial edema, pedal pulses 2+= bilaterally Skin: warm and dry, no rash Neuro:  Strength and sensation are intact Psych: euthymic mood, full affect  EKG:  EKG is ordered today. The ekg ordered today shows NSR 64 bpm, possible inferior infarct unchanged from previous  Recent Labs: 07/01/2016: ALT 17; BUN 23; Creat 1.03; Hemoglobin 14.5; Platelets 201; Potassium 4.4; Sodium 141   Lipid Panel     Component Value Date/Time   CHOL 176 07/01/2016 0901   TRIG 45 07/01/2016 0901   HDL 56 07/01/2016 0901   CHOLHDL 3.1 07/01/2016 0901   VLDL 13 04/21/2015 0912   LDLCALC 125 04/21/2015 0912      Wt Readings from Last 3 Encounters:  09/06/16 196 lb 12.8 oz (89.3 kg)  08/17/16 199 lb (90.3 kg)  08/05/16 199 lb 8 oz (90.5 kg)     Cardiac Studies Reviewed: 2D Echo: Study Conclusions  - Left ventricle: The cavity size was normal. Wall thickness was   increased in a pattern of mild LVH. Systolic function was normal.   The estimated ejection fraction was in the range of 55% to 60%.   Wall motion was normal;  there were no regional wall motion   abnormalities. Features are consistent with a pseudonormal left   ventricular filling pattern, with concomitant abnormal relaxation   and increased filling pressure (grade 2 diastolic dysfunction). - Aortic valve: There was a bioprosthetic aortic valve. The valve   leaflets were not well-visualized, mean gradient was higher on   this study than in the past, likely in the mildly stenotic range   for this valve. There was trivial regurgitation. Mean gradient   (S): 21 mm Hg. Peak gradient (S): 36 mm Hg. - Aorta: Mildly dilated aortic root and ascending aorta. Aortic   root dimension: 38 mm (ED). Ascending aortic diameter: 42 mm (S). - Mitral valve: Mildly calcified annulus. There was trivial   regurgitation. - Left atrium: The atrium was mildly dilated. - Right ventricle: The cavity size was normal.  Systolic function   was normal. - Right atrium: The atrium was mildly dilated. - Atrial septum: Atrial septal closure device was in place. No   evident flow across the device. - Tricuspid valve: Peak RV-RA gradient (S): 25 mm Hg. - Pulmonary arteries: PA peak pressure: 33 mm Hg (S). - Systemic veins: IVC measured 1.9 cm with < 50% respirophasic   variation, suggesting RA pressure 8 mmHg.  Impressions:  - Normal LV size with mild LV hypertrophy. EF 55-60%. Moderate   diastolic dysfunction. Normal RV size and systolic function.   Biatrial enlargement. Bioprosthetic aortic valve with mean   gradient 21 mmHg. This is higher than on the past study, possibly   representing a mild degree of bioprosthetic valve stenosis.   Atrial septal closure device in place with no evident leak.  CT Angio 12/15/2015: IMPRESSION: No acute finding.  Greatest diameter of the ascending aorta measures approximately 4.3 cm.  Recommend annual imaging followup by CTA or MRA. This recommendation follows 2010 ACCF/AHA/AATS/ACR/ASA/SCA/SCAI/SIR/STS/SVM Guidelines for the  Diagnosis and Management of Patients with Thoracic Aortic Disease. Circulation. 2010; 121: X540-G867  Surgical changes of prior median sternotomy and aortic valve repair.  Cardiomegaly.  Two vessel coronary artery disease.  Signed,  Dulcy Fanny. Earleen Newport, DO  ASSESSMENT AND PLAN: 1.  Aortic valve disease status post bioprosthetic aortic valve replacement: The patient has New York Heart Association functional class I symptoms. SBE prophylaxis guidelines reviewed and he follows them routinely. Mildly elevated gradients identified at last echo 1 year ago. Will repeat his echo this year for surveillance.  2. Essential hypertension: [place] hypertension. Home blood pressures are well controlled.  3. Aortic dilatation: Last CT scan from September 2017 reviewed. Follow-up planned in 1 year interval with Dr. Roxy Manns with a repeat scan.  Current medicines are reviewed with the patient today.  The patient does not have concerns regarding medicines.  Labs/ tests ordered today include:   Orders Placed This Encounter  Procedures  . EKG 12-Lead  . ECHOCARDIOGRAM COMPLETE    Disposition:   FU one year  Signed, [name], MD  09/06/2016 4:10 PM    [facility] [place], [place], [place]  [address] Phone: [phone]; Fax: [phone]

## 2016-09-06 NOTE — Patient Instructions (Signed)

## 2016-09-16 DIAGNOSIS — M5136 Other intervertebral disc degeneration, lumbar region: Secondary | ICD-10-CM | POA: Diagnosis not present

## 2016-09-16 DIAGNOSIS — M9903 Segmental and somatic dysfunction of lumbar region: Secondary | ICD-10-CM | POA: Diagnosis not present

## 2016-09-16 DIAGNOSIS — M9904 Segmental and somatic dysfunction of sacral region: Secondary | ICD-10-CM | POA: Diagnosis not present

## 2016-10-07 DIAGNOSIS — M5136 Other intervertebral disc degeneration, lumbar region: Secondary | ICD-10-CM | POA: Diagnosis not present

## 2016-10-07 DIAGNOSIS — M9903 Segmental and somatic dysfunction of lumbar region: Secondary | ICD-10-CM | POA: Diagnosis not present

## 2016-10-07 DIAGNOSIS — M9904 Segmental and somatic dysfunction of sacral region: Secondary | ICD-10-CM | POA: Diagnosis not present

## 2016-10-08 ENCOUNTER — Other Ambulatory Visit: Payer: Self-pay | Admitting: Sports Medicine

## 2016-10-08 DIAGNOSIS — M25552 Pain in left hip: Secondary | ICD-10-CM

## 2016-10-26 ENCOUNTER — Telehealth: Payer: Self-pay | Admitting: Cardiovascular Disease

## 2016-10-26 MED ORDER — AMOXICILLIN 500 MG PO TABS: ORAL_TABLET | ORAL | 2 refills | Status: DC

## 2016-10-26 NOTE — Telephone Encounter (Signed)
New message     1. What dental office are you calling from?  Patient wife calling   2. What is your office phone and fax number?  Dr Delphina Cahill   3. What type of procedure is the patient having performed?  Cleaning   4. What date is procedure scheduled? 10/27/16   5. What is your question (ex. Antibiotics prior to procedure, holding medication-we need to know how long dentist wants pt to hold med)?  Patient forgot to have you calling premeds when he was in the office - [facility]

## 2016-10-26 NOTE — Telephone Encounter (Signed)
I spoke with the pt's wife and made her aware that Rx has been sent to the pharmacy.

## 2016-11-04 DIAGNOSIS — M9903 Segmental and somatic dysfunction of lumbar region: Secondary | ICD-10-CM | POA: Diagnosis not present

## 2016-11-04 DIAGNOSIS — M5136 Other intervertebral disc degeneration, lumbar region: Secondary | ICD-10-CM | POA: Diagnosis not present

## 2016-11-04 DIAGNOSIS — M9904 Segmental and somatic dysfunction of sacral region: Secondary | ICD-10-CM | POA: Diagnosis not present

## 2016-11-11 ENCOUNTER — Other Ambulatory Visit: Payer: Self-pay | Admitting: *Deleted

## 2016-11-11 DIAGNOSIS — I7789 Other specified disorders of arteries and arterioles: Secondary | ICD-10-CM

## 2016-11-15 ENCOUNTER — Other Ambulatory Visit: Payer: Self-pay | Admitting: *Deleted

## 2016-11-15 DIAGNOSIS — I7789 Other specified disorders of arteries and arterioles: Secondary | ICD-10-CM

## 2016-11-17 DIAGNOSIS — M25552 Pain in left hip: Secondary | ICD-10-CM | POA: Diagnosis not present

## 2016-11-17 DIAGNOSIS — M1612 Unilateral primary osteoarthritis, left hip: Secondary | ICD-10-CM | POA: Diagnosis not present

## 2016-11-17 DIAGNOSIS — S73192A Other sprain of left hip, initial encounter: Secondary | ICD-10-CM | POA: Diagnosis not present

## 2016-11-19 ENCOUNTER — Other Ambulatory Visit: Payer: Self-pay | Admitting: *Deleted

## 2016-11-19 DIAGNOSIS — I7789 Other specified disorders of arteries and arterioles: Secondary | ICD-10-CM

## 2016-11-30 ENCOUNTER — Ambulatory Visit (HOSPITAL_COMMUNITY)
Admission: RE | Admit: 2016-11-30 | Discharge: 2016-11-30 | Disposition: A | Discharge status: Home / Self Care | Payer: 59 | Source: Ambulatory Visit | Attending: Thoracic Surgery (Cardiothoracic Vascular Surgery) | Admitting: Thoracic Surgery (Cardiothoracic Vascular Surgery)

## 2016-11-30 ENCOUNTER — Encounter (HOSPITAL_COMMUNITY): Payer: Self-pay

## 2016-11-30 DIAGNOSIS — I7789 Other specified disorders of arteries and arterioles: Secondary | ICD-10-CM | POA: Diagnosis not present

## 2016-11-30 DIAGNOSIS — I350 Nonrheumatic aortic (valve) stenosis: Secondary | ICD-10-CM | POA: Diagnosis not present

## 2016-11-30 DIAGNOSIS — I2510 Atherosclerotic heart disease of native coronary artery without angina pectoris: Secondary | ICD-10-CM | POA: Diagnosis not present

## 2016-11-30 DIAGNOSIS — I712 Thoracic aortic aneurysm, without rupture: Secondary | ICD-10-CM | POA: Diagnosis not present

## 2016-11-30 MED ORDER — IOPAMIDOL (ISOVUE-370) INJECTION 76%
INTRAVENOUS | Status: AC
  Administered 2016-11-30: 100 mL
  Filled 2016-11-30: qty 100

## 2016-11-30 MED ORDER — NITROGLYCERIN 0.4 MG SL SUBL
0.8000 mg | SUBLINGUAL_TABLET | Freq: Once | SUBLINGUAL | Status: AC
  Administered 2016-11-30: 0.8 mg via SUBLINGUAL

## 2016-11-30 MED ORDER — NITROGLYCERIN 0.4 MG SL SUBL
SUBLINGUAL_TABLET | SUBLINGUAL | Status: AC
  Filled 2016-11-30: qty 2

## 2016-12-07 ENCOUNTER — Ambulatory Visit (HOSPITAL_COMMUNITY): Payer: 59 | Attending: Cardiology

## 2016-12-07 ENCOUNTER — Other Ambulatory Visit: Payer: Self-pay

## 2016-12-07 DIAGNOSIS — Z952 Presence of prosthetic heart valve: Secondary | ICD-10-CM

## 2016-12-07 DIAGNOSIS — I10 Essential (primary) hypertension: Secondary | ICD-10-CM | POA: Diagnosis not present

## 2016-12-07 DIAGNOSIS — I340 Nonrheumatic mitral (valve) insufficiency: Secondary | ICD-10-CM | POA: Diagnosis not present

## 2016-12-07 DIAGNOSIS — I77819 Aortic ectasia, unspecified site: Secondary | ICD-10-CM | POA: Diagnosis not present

## 2016-12-07 DIAGNOSIS — I350 Nonrheumatic aortic (valve) stenosis: Secondary | ICD-10-CM | POA: Diagnosis not present

## 2016-12-13 ENCOUNTER — Encounter: Payer: Self-pay | Admitting: Thoracic Surgery (Cardiothoracic Vascular Surgery)

## 2016-12-13 ENCOUNTER — Ambulatory Visit (INDEPENDENT_AMBULATORY_CARE_PROVIDER_SITE_OTHER): Payer: 59 | Admitting: Thoracic Surgery (Cardiothoracic Vascular Surgery)

## 2016-12-13 VITALS — BP 147/92 | HR 68 | Resp 16 | Ht 71.0 in | Wt 195.0 lb

## 2016-12-13 DIAGNOSIS — I7789 Other specified disorders of arteries and arterioles: Secondary | ICD-10-CM

## 2016-12-13 NOTE — Patient Instructions (Signed)
Continue all previous medications without any changes at this time  

## 2016-12-13 NOTE — Progress Notes (Signed)
[address]       [place], [address]             [phone]     CARDIOTHORACIC SURGERY OFFICE NOTE  Referring Provider is [name], MD PCP is [name], MD   HPI:  Patient is a 61 year old male underwent aortic valve replacement using a bioprosthetic tissue valve on 12/05/2013 who returns to the office today for routine follow-up and surveillance of mild dilatation of the ascending thoracic aorta. He was last seen in our office on 12/15/2015. Since then he has done remarkably well. He completed a marathon last January. Since his surgery 2 years ago he lost 80 pounds. He exercises religiously and states that he feels better than he has in many years. He reports absolutely no problems or complaints.   Current Outpatient Prescriptions  Medication Sig Dispense Refill  . amoxicillin (AMOXIL) 500 MG tablet Take 4 tablets by mouth one hour prior to dental appointment 8 tablet 2  . aspirin 81 MG EC tablet Take 1 tablet (81 mg total) by mouth daily.    . Boswellia-Glucosamine-Vit D (GLUCOSAMINE COMPLEX PO) Take 1 capsule by mouth daily.    . celecoxib (CELEBREX) 200 MG capsule TAKE 1 TO 2 CAPSULES BY MOUTH EVERY DAY AS NEEDED FOR PAIN 60 capsule 2  . Coenzyme Q10 300 MG CAPS Take 1 capsule by mouth daily.    . Multiple Vitamin (MULTIVITAMIN WITH MINERALS) TABS tablet Take 1 tablet by mouth daily.    . Omega 3 1000 MG CAPS Take 3 capsules by mouth daily.    . Probiotic Product (PROBIOTIC PO) Take 1 capsule by mouth daily.    . TURMERIC PO Take 1 capsule by mouth daily.     No current facility-administered medications for this visit.       Physical Exam:   BP (!) 147/92 (BP Location: Right Arm, Patient Position: Sitting, Cuff Size: Large)   Pulse 68   Resp 16   Ht 5\' 11"  (1.803 m)   Wt 195 lb (88.5 kg)   SpO2 99% Comment: ON RA  BMI 27.20 kg/m   General:  Well-appearing  Chest:   Clear to auscultation  CV:   Regular rate and rhythm with  soft systolic murmur  Incisions:  Completely healed  Abdomen:  Soft nontender  Extremities:  Warm and well-perfused  Diagnostic Tests:  Cardiac TAVR CT  COMPARISON:  CTA on 12/15/2015  TECHNIQUE: The patient was scanned on a [employer]. A 120 kV retrospective scan was triggered in the descending thoracic aorta at 111 HU's. Gantry rotation speed was 250 msecs and collimation was .6 mm. No beta blockade or nitro were given. The 3D data set was reconstructed in 5% intervals of the R-R cycle. Systolic and diastolic phases were analyzed on a dedicated work station using MPR, MIP and VRT modes. The patient received 80 cc of contrast.  FINDINGS: Aortic Valve: A bioprosthetic aortic valve with good leaflet opening. Leaflets appear normal - no thickening or calcifications are seen.  Aorta: There is mild ascending aortic aneurysm, minimal calcifications.  Sinotubular Junction:  34 x 34 mm (previously 34 x 34 mm)  Ascending Thoracic Aorta:  42 x 42 mm (previously 43 mm)  Aortic Arch:  Not visualized  Descending Thoracic Aorta:  33 x 31 mm (previously 33 x 31 mm)  Sinus of Valsalva Measurements:  Non-coronary:  34 mm  Right -coronary:  31 mm  Left -coronary:  39 mm  Coronary Arteries:  Normal origin, right dominance.  RCA - large, no plaque.  LM - minimal calcified plaque in the distal LM with 0-25% stenosis.  LAD - mild mixed plaque with 25-50% stenosis in the proximal to mid LAD. D1 no plaque.  LCX - minimal plaque in the ostial LCX, OM1 - no plaque.  Normal pulmonary vein drainage into the left atrium.  No thrombus in the left atrial appendage.  IMPRESSION: 1. Stable size of the ascending aortic aneurysm with maximum diameter 42 mm.  2.  Stable appearance of the aortic bioprosthesis.  3.  Mild nonobstructive CAD.  Ena Dawley   Electronically Signed   By: Ena Dawley   On: 12/02/2016  09:13    Impression:  Stable mild dilatation of the ascending thoracic aorta in the setting of bicuspid aortic valve disease status post aortic valve replacement in 2015. The patient's aorta has not changed at all since the time of his surgery.  Plan:  The patient will return in 2 years for follow-up CT angiogram. I have reassured the patient that because of the minimal dilatation of the proximal aorta and the manner in which his aorta was closed at the time of surgery, the likelihood of him developing significant aneurysmal enlargement or other complications related to his ascending aorta should remain quite low as long as his blood pressure remains under good control.    I spent in excess of 15 minutes during the conduct of this office consultation and >50% of this time involved direct face-to-face encounter with the patient for counseling and/or coordination of their care.    Valentina Gu. Roxy Manns, MD 12/13/2016 11:21 AM

## 2017-01-18 ENCOUNTER — Other Ambulatory Visit: Payer: Self-pay | Admitting: Sports Medicine

## 2017-01-18 DIAGNOSIS — M25552 Pain in left hip: Secondary | ICD-10-CM

## 2017-02-15 ENCOUNTER — Encounter: Payer: Self-pay | Admitting: Sports Medicine

## 2017-02-18 ENCOUNTER — Ambulatory Visit: Payer: 59 | Admitting: Sports Medicine

## 2017-02-18 ENCOUNTER — Encounter: Payer: Self-pay | Admitting: Sports Medicine

## 2017-02-18 DIAGNOSIS — M25552 Pain in left hip: Secondary | ICD-10-CM | POA: Diagnosis not present

## 2017-02-18 NOTE — Progress Notes (Signed)
  Subjective:    CC: Left hip pain  HPI: This is a pleasant 61 year old male, several months ago we evaluated him for left hip pain referrable to the joint, ultimately we proceeded with MR arthrogram that showed labral degenerative changes, as well as hip osteoarthritis and some surrounding tendinopathy.  He had a good response for about 6 months and is now having a recurrence of pain, moderate, persistent, localized in the groin, no radiation, no mechanical symptoms.  Past medical history:  Negative.  See flowsheet/record as well for more information.  Surgical history: Negative.  See flowsheet/record as well for more information.  Family history: Negative.  See flowsheet/record as well for more information.  Social history: Negative.  See flowsheet/record as well for more information.  Allergies, and medications have been entered into the medical record, reviewed, and no changes needed.   Review of Systems: No fevers, chills, night sweats, weight loss, chest pain, or shortness of breath.   Objective:    General: Well Developed, well nourished, and in no acute distress.  Neuro: Alert and oriented x3, extra-ocular muscles intact, sensation grossly intact.  HEENT: Normocephalic, atraumatic, pupils equal round reactive to light, neck supple, no masses, no lymphadenopathy, thyroid nonpalpable.  Skin: Warm and dry, no rashes. Cardiac: Regular rate and rhythm, no murmurs rubs or gallops, no lower extremity edema.  Respiratory: Clear to auscultation bilaterally. Not using accessory muscles, speaking in full sentences.  Procedure: Real-time Ultrasound Guided Injection of left hip joint Device: GE Logiq E  Verbal informed consent obtained.  Time-out conducted.  Noted no overlying erythema, induration, or other signs of local infection.  Skin prepped in a sterile fashion.  Local anesthesia: Topical Ethyl chloride.  With sterile technique and under real time ultrasound guidance: Using a 22-gauge  spinal needle advanced to the femoral head/neck junction, contacted bone and then inject 1 cc kenalog 40, 2 cc lidocaine, 2 cc bupivacaine. Completed without difficulty  Pain immediately resolved suggesting accurate placement of the medication.  Advised to call if fevers/chills, erythema, induration, drainage, or persistent bleeding.  Images permanently stored and available for review in the ultrasound unit.  Impression: Technically successful ultrasound guided injection.  Impression and Recommendations:    Left hip pain Osteoarthritis with labral tearing on MRI. Last injection was done as an MR arthrogram injection in May, good resolution of pain, we want to avoid proceeding arthroplasty so repeat hip joint injection today, he will continue to work on weight loss, isotonic exercises. Return as needed for this.  He did have some gluteus medius/trochanteric bursa pain as well, if this resolves with the hip joint injection no need for follow-up, if not I can do an additional trochanteric bursa injection.  ___________________________________________ Gwen Her. Dianah Field, M.D., ABFM., CAQSM. Primary Care and [place] Instructor of [place] [employer] [facility] [employer]

## 2017-02-18 NOTE — Assessment & Plan Note (Addendum)
Osteoarthritis with labral tearing on MRI. Last injection was done as an MR arthrogram injection in May, good resolution of pain, we want to avoid proceeding arthroplasty so repeat hip joint injection today, he will continue to work on weight loss, isotonic exercises. Return as needed for this.  He did have some gluteus medius/trochanteric bursa pain as well, if this resolves with the hip joint injection no need for follow-up, if not I can do an additional trochanteric bursa injection.

## 2017-04-17 ENCOUNTER — Encounter: Payer: Self-pay | Admitting: Sports Medicine

## 2017-04-18 ENCOUNTER — Encounter: Payer: Self-pay | Admitting: Sports Medicine

## 2017-05-02 ENCOUNTER — Other Ambulatory Visit: Payer: Self-pay | Admitting: Sports Medicine

## 2017-05-02 DIAGNOSIS — M25552 Pain in left hip: Secondary | ICD-10-CM

## 2017-06-09 ENCOUNTER — Encounter: Payer: Self-pay | Admitting: Sports Medicine

## 2017-06-10 MED ORDER — TRIAMCINOLONE ACETONIDE 0.1 % EX CREA: 1.0000 "application " | TOPICAL_CREAM | Freq: Two times a day (BID) | CUTANEOUS | 0 refills | Status: DC

## 2017-06-10 NOTE — Addendum Note (Signed)
Addended by: [name] on: 06/10/2017 11:49 AM   Modules accepted: Orders

## 2017-10-15 ENCOUNTER — Other Ambulatory Visit: Payer: Self-pay | Admitting: Sports Medicine

## 2017-10-15 DIAGNOSIS — M25552 Pain in left hip: Secondary | ICD-10-CM

## 2017-10-30 ENCOUNTER — Other Ambulatory Visit: Payer: Self-pay | Admitting: Sports Medicine

## 2017-10-30 DIAGNOSIS — M25552 Pain in left hip: Secondary | ICD-10-CM

## 2017-11-16 ENCOUNTER — Telehealth: Payer: Self-pay | Admitting: Cardiovascular Disease

## 2017-11-16 DIAGNOSIS — Z23 Encounter for immunization: Secondary | ICD-10-CM | POA: Diagnosis not present

## 2017-11-16 MED ORDER — AMOXICILLIN 500 MG PO TABS: ORAL_TABLET | ORAL | 6 refills | Status: DC

## 2017-11-16 NOTE — Telephone Encounter (Signed)
thanks

## 2017-11-16 NOTE — Telephone Encounter (Signed)
Confirmed patient's pharmacy and refill sent. He was grateful for call and agrees with treatment plan.

## 2017-11-16 NOTE — Telephone Encounter (Signed)
Pt calling requesting a refill on amoxicillin for a dental appt. Would Dr. Burt Knack like to refill this medication? Please address

## 2017-11-16 NOTE — Telephone Encounter (Signed)
New Message    *STAT* If patient is at the pharmacy, call can be transferred to refill team.   1. Which medications need to be refilled? (please list name of each medication and dose if known) amoxicillin (AMOXIL) 500 MG tablet Take 4 tablets by mouth one hour prior to dental appointment  2. Which pharmacy/location (including street and city if local pharmacy) is medication to be sent to? [id number] - [place], [place] - [address]  3. Do they need a 30 day or 90 day supply?

## 2017-11-29 ENCOUNTER — Ambulatory Visit: Payer: 59 | Admitting: Sports Medicine

## 2017-12-01 ENCOUNTER — Ambulatory Visit (INDEPENDENT_AMBULATORY_CARE_PROVIDER_SITE_OTHER): Payer: 59 | Admitting: Sports Medicine

## 2017-12-01 ENCOUNTER — Encounter: Payer: Self-pay | Admitting: Sports Medicine

## 2017-12-01 DIAGNOSIS — M1612 Unilateral primary osteoarthritis, left hip: Secondary | ICD-10-CM | POA: Diagnosis not present

## 2017-12-01 DIAGNOSIS — Z0000 Encounter for general adult medical examination without abnormal findings: Secondary | ICD-10-CM | POA: Diagnosis not present

## 2017-12-01 NOTE — Progress Notes (Addendum)
Subjective:    CC: Left hip pain  HPI: Patrick Walter has known left hip osteoarthritis, his previous injection was approximately 10 months ago, now having a recurrence of pain in the groin but more so in the left buttock, moderate, persistent without radiation.  No mechanical symptoms, no trauma.  Annual physical exam: Would like routine labs ordered for his physical coming up.  I reviewed the past medical history, family history, social history, surgical history, and allergies today and no changes were needed.  Please see the problem list section below in epic for further details.  Past Medical History: Past Medical History:  Diagnosis Date  . Aortic stenosis 07/24/2013  . Ascending aorta enlargement ([place])   . Bicuspid aortic valve   . Coronary artery disease   . Heart murmur   . Hx of echocardiogram    post AVR >> Echo (10/15):  Mild LVH, mod focal basal septal hypertrophy, EF 55-60%, Gr 1 DD, AVR ok (mean 15 mmHg), dilated ascending Aorta (44 mm), mod LAE  . Hypertension   . Patent foramen ovale 10/23/2013   Closed at the time of aortic valve replacement  . S/P aortic valve replacement with bioprosthetic valve 12/05/2013   25 mm [facility] Ease bovine pericardial tissue valve    Past Surgical History: Past Surgical History:  Procedure Laterality Date  . AORTIC VALVE REPLACEMENT N/A 12/05/2013   Procedure: AORTIC VALVE REPLACEMENT (AVR);  Surgeon: [name], MD;  Location: [place];  Service: Open Heart Surgery;  Laterality: N/A;  . HAND SURGERY Left 07/1977  . INTRAOPERATIVE TRANSESOPHAGEAL ECHOCARDIOGRAM N/A 12/05/2013   Procedure: INTRAOPERATIVE TRANSESOPHAGEAL ECHOCARDIOGRAM;  Surgeon: [name], MD;  Location: [place];  Service: Open Heart Surgery;  Laterality: N/A;  . LEFT AND RIGHT HEART CATHETERIZATION WITH CORONARY ANGIOGRAM N/A 11/08/2013   Procedure: LEFT AND RIGHT HEART CATHETERIZATION WITH CORONARY ANGIOGRAM;  Surgeon: Blane Ohara, MD;  Location: [facility] CATH LAB;   Service: Cardiovascular;  Laterality: N/A;  . PATENT FORAMEN OVALE CLOSURE N/A 12/05/2013   Procedure: PATENT FORAMEN OVALE CLOSURE;  Surgeon: [name], MD;  Location: [place];  Service: Open Heart Surgery;  Laterality: N/A;  . TEE WITHOUT CARDIOVERSION N/A 10/23/2013   Procedure: TRANSESOPHAGEAL ECHOCARDIOGRAM (TEE);  Surgeon: Larey Dresser, MD;  Location: [facility] ENDOSCOPY;  Service: Cardiovascular;  Laterality: N/A;  . VASECTOMY  06/2002   Social History: Social History   Socioeconomic History  . Marital status: Married    Spouse name: Not on file  . Number of children: 5  . Years of education: Not on file  . Highest education level: Not on file  Occupational History  . Occupation: Scientist, forensic: [employer]  . Financial resource strain: Not on file  . Food insecurity:    Worry: Not on file    Inability: Not on file  . Transportation needs:    Medical: Not on file    Non-medical: Not on file  Tobacco Use  . Smoking status: Never Smoker  . Smokeless tobacco: Never Used  Substance and Sexual Activity  . Alcohol use: Yes    Alcohol/week: 0.0 standard drinks    Comment: occ  . Drug use: No  . Sexual activity: Not on file  Lifestyle  . Physical activity:    Days per week: Not on file    Minutes per session: Not on file  . Stress: Not on file  Relationships  . Social connections:  Talks on phone: Not on file    Gets together: Not on file    Attends religious service: Not on file    Active member of club or organization: Not on file    Attends meetings of clubs or organizations: Not on file    Relationship status: Not on file  Other Topics Concern  . Not on file  Social History Narrative   The patient is married. He is originally from [place]. He works as an Art gallery manager. He does not smoke cigarettes or drink alcohol.   Family History: Family History  Problem Relation Age of Onset  . Diabetes Mother   . Cancer Mother         colon  . Diabetes Father   . Cancer Father        colon  . Colon cancer Father 75  . Diabetes Paternal Grandmother   . Cancer Paternal Grandmother   . Colon cancer Paternal Grandmother 14  . Cancer Daughter        leukemia  . Heart attack Maternal Grandmother   . Hypertension Maternal Grandmother   . Diabetes Maternal Grandmother   . Cancer Maternal Grandmother   . Diabetes Maternal Grandfather   . Cancer Maternal Grandfather   . Stroke Paternal Grandfather   . Diabetes Paternal Grandfather   . Cancer Paternal Grandfather    Allergies: No Known Allergies Medications: See med rec.  Review of Systems: No fevers, chills, night sweats, weight loss, chest pain, or shortness of breath.   Objective:    General: Well Developed, well nourished, and in no acute distress.  Neuro: Alert and oriented x3, extra-ocular muscles intact, sensation grossly intact.  HEENT: Normocephalic, atraumatic, pupils equal round reactive to light, neck supple, no masses, no lymphadenopathy, thyroid nonpalpable.  Skin: Warm and dry, no rashes. Cardiac: Regular rate and rhythm, no murmurs rubs or gallops, no lower extremity edema.  Respiratory: Clear to auscultation bilaterally. Not using accessory muscles, speaking in full sentences. Left hip: ROM Internally rotates only to about 10 degrees, with reproduction of groin pain and a little bit of reproduction of buttock pain as well, ER: 60 Deg, Flexion: 120 Deg, Extension: 100 Deg, Abduction: 45 Deg, Adduction: 45 Deg Strength IR: 5/5, ER: 5/5, Flexion: 5/5, Extension: 5/5, Abduction: 5/5, Adduction: 5/5 Pelvic alignment unremarkable to inspection and palpation. Standing hip rotation and gait without trendelenburg / unsteadiness. Greater trochanter without tenderness to palpation. No tenderness over piriformis. No SI joint tenderness and normal minimal SI movement.  Procedure: Real-time Ultrasound Guided Injection of left femoralacetabular joint Device:  GE Logiq E  Verbal informed consent obtained.  Time-out conducted.  Noted no overlying erythema, induration, or other signs of local infection.  Skin prepped in a sterile fashion.  Local anesthesia: Topical Ethyl chloride.  With sterile technique and under real time ultrasound guidance: Noted hip joint effusion, 22-gauge spinal needle advanced to the femoral head/neck junction, contacted bone and injected 1 cc kenalog 40, 2 cc lidocaine, 2 cc bupivacaine. Completed without difficulty  Pain immediately resolved suggesting accurate placement of the medication.  Advised to call if fevers/chills, erythema, induration, drainage, or persistent bleeding.  Images permanently stored and available for review in the ultrasound unit.  Impression: Technically successful ultrasound guided injection.  Impression and Recommendations:    Primary osteoarthritis of left hip Hip injection as above, previous injection was 10 months ago. We did discuss the positives and negatives of Visco supplementation, stem cell injections and platelet rich plasma injections. He  is not yet ready to consider hip arthroplasty or hip resurfacing.   Return as needed.  Hypercalcemia Labs look normal with the exception of calcium levels which are high, I am going to add another order to get this checked one more time before proceeding with a hyperparathyroid work-up.  Standard and ionized calcium levels are both elevated, need to add parathyroid hormone levels, orders placed, see if we can add this to the blood already in the lab. ___________________________________________ Gwen Her. Dianah Field, M.D., ABFM., CAQSM. Primary Care and [place] Instructor of [place] [facility] of Medicine

## 2017-12-01 NOTE — Assessment & Plan Note (Signed)
Hip injection as above, previous injection was 10 months ago. We did discuss the positives and negatives of Visco supplementation, stem cell injections and platelet rich plasma injections. He is not yet ready to consider hip arthroplasty or hip resurfacing.   Return as needed.

## 2017-12-06 DIAGNOSIS — Z0000 Encounter for general adult medical examination without abnormal findings: Secondary | ICD-10-CM | POA: Diagnosis not present

## 2017-12-07 ENCOUNTER — Encounter: Payer: Self-pay | Admitting: Sports Medicine

## 2017-12-07 DIAGNOSIS — E210 Primary hyperparathyroidism: Secondary | ICD-10-CM | POA: Insufficient documentation

## 2017-12-07 LAB — COMPREHENSIVE METABOLIC PANEL
AG Ratio: 1.5 (calc) (ref 1.0–2.5)
ALT: 11 U/L (ref 9–46)
Albumin: 4.3 g/dL (ref 3.6–5.1)
Alkaline phosphatase (APISO): 69 U/L (ref 40–115)
CO2: 27 mmol/L (ref 20–32)
Chloride: 103 mmol/L (ref 98–110)
Creat: 1.06 mg/dL (ref 0.70–1.25)
Globulin: 2.8 g/dL (calc) (ref 1.9–3.7)
Sodium: 139 mmol/L (ref 135–146)
Total Bilirubin: 0.9 mg/dL (ref 0.2–1.2)
Total Protein: 7.1 g/dL (ref 6.1–8.1)

## 2017-12-07 LAB — HEMOGLOBIN A1C
Hgb A1c MFr Bld: 5.4 % of total Hgb (ref <5.7)
Mean Plasma Glucose: 108 (calc)
eAG (mmol/L): 6 (calc)

## 2017-12-07 LAB — LIPID PANEL W/REFLEX DIRECT LDL
Cholesterol: 171 mg/dL (ref <200)
HDL: 48 mg/dL (ref >40)
LDL Cholesterol (Calc): 107 mg/dL — ABNORMAL HIGH
Non-HDL Cholesterol (Calc): 123 mg/dL (calc) (ref <130)
Total CHOL/HDL Ratio: 3.6 (calc) (ref <5.0)
Triglycerides: 75 mg/dL (ref <150)

## 2017-12-07 LAB — CBC
HCT: 41.3 % (ref 38.5–50.0)
Hemoglobin: 13.8 g/dL (ref 13.2–17.1)
MCH: 28.2 pg (ref 27.0–33.0)
MCHC: 33.4 g/dL (ref 32.0–36.0)
MCV: 84.5 fL (ref 80.0–100.0)
MPV: 11.2 fL (ref 7.5–12.5)
Platelets: 206 Thousand/uL (ref 140–400)
RBC: 4.89 10*6/uL (ref 4.20–5.80)
RDW: 12.8 % (ref 11.0–15.0)
WBC: 9 10*3/uL (ref 3.8–10.8)

## 2017-12-07 LAB — COMPREHENSIVE METABOLIC PANEL WITH GFR
AST: 15 U/L (ref 10–35)
BUN: 20 mg/dL (ref 7–25)
Calcium: 10.6 mg/dL — ABNORMAL HIGH (ref 8.6–10.3)
Glucose, Bld: 97 mg/dL (ref 65–99)
Potassium: 4.3 mmol/L (ref 3.5–5.3)

## 2017-12-07 LAB — TSH: TSH: 2.94 mIU/L (ref 0.40–4.50)

## 2017-12-07 NOTE — Addendum Note (Signed)
Addended by: [name] on: 12/07/2017 10:28 AM   Modules accepted: Orders

## 2017-12-07 NOTE — Assessment & Plan Note (Addendum)
Labs look normal with the exception of calcium levels which are high, I am going to add another order to get this checked one more time before proceeding with a hyperparathyroid work-up.  Standard and ionized calcium levels are both elevated, need to add parathyroid hormone levels, orders placed, see if we can add this to the blood already in the lab.

## 2017-12-14 ENCOUNTER — Encounter: Payer: Self-pay | Admitting: Sports Medicine

## 2017-12-14 LAB — BASIC METABOLIC PANEL WITH GFR
BUN: 21 mg/dL (ref 7–25)
Creat: 1.04 mg/dL (ref 0.70–1.25)
Potassium: 4.5 mmol/L (ref 3.5–5.3)

## 2017-12-14 LAB — BASIC METABOLIC PANEL
CO2: 22 mmol/L (ref 20–32)
Calcium: 10.4 mg/dL — ABNORMAL HIGH (ref 8.6–10.3)
Chloride: 109 mmol/L (ref 98–110)
Glucose, Bld: 86 mg/dL (ref 65–99)
Sodium: 145 mmol/L (ref 135–146)

## 2017-12-14 LAB — CALCIUM, IONIZED: Calcium, Ion: 5.65 mg/dL — ABNORMAL HIGH (ref 4.8–5.6)

## 2017-12-14 NOTE — Addendum Note (Signed)
Addended by: [name] on: 12/14/2017 10:11 AM   Modules accepted: Orders

## 2017-12-16 ENCOUNTER — Encounter: Payer: Self-pay | Admitting: Sports Medicine

## 2017-12-16 LAB — PTH, INTACT AND CALCIUM
Calcium: 10.3 mg/dL (ref 8.6–10.3)
PTH: 62 pg/mL (ref 14–64)

## 2018-01-06 ENCOUNTER — Ambulatory Visit: Payer: 59 | Admitting: Cardiovascular Disease

## 2018-01-06 ENCOUNTER — Encounter

## 2018-01-15 ENCOUNTER — Encounter (HOSPITAL_BASED_OUTPATIENT_CLINIC_OR_DEPARTMENT_OTHER): Payer: Self-pay | Admitting: *Deleted

## 2018-01-15 ENCOUNTER — Inpatient Hospital Stay (HOSPITAL_BASED_OUTPATIENT_CLINIC_OR_DEPARTMENT_OTHER)
Admission: EM | Admit: 2018-01-15 | Discharge: 2018-02-01 | DRG: 219 | Disposition: A | Discharge status: Home Health | Payer: 59 | Attending: Thoracic Surgery (Cardiothoracic Vascular Surgery) | Admitting: Thoracic Surgery (Cardiothoracic Vascular Surgery)

## 2018-01-15 ENCOUNTER — Emergency Department (HOSPITAL_BASED_OUTPATIENT_CLINIC_OR_DEPARTMENT_OTHER): Payer: 59

## 2018-01-15 ENCOUNTER — Other Ambulatory Visit: Payer: Self-pay

## 2018-01-15 DIAGNOSIS — E210 Primary hyperparathyroidism: Secondary | ICD-10-CM | POA: Diagnosis present

## 2018-01-15 DIAGNOSIS — T826XXA Infection and inflammatory reaction due to cardiac valve prosthesis, initial encounter: Principal | ICD-10-CM | POA: Diagnosis present

## 2018-01-15 DIAGNOSIS — A419 Sepsis, unspecified organism: Secondary | ICD-10-CM | POA: Diagnosis present

## 2018-01-15 DIAGNOSIS — I442 Atrioventricular block, complete: Secondary | ICD-10-CM | POA: Diagnosis not present

## 2018-01-15 DIAGNOSIS — Z09 Encounter for follow-up examination after completed treatment for conditions other than malignant neoplasm: Secondary | ICD-10-CM

## 2018-01-15 DIAGNOSIS — L0591 Pilonidal cyst without abscess: Secondary | ICD-10-CM | POA: Diagnosis present

## 2018-01-15 DIAGNOSIS — I10 Essential (primary) hypertension: Secondary | ICD-10-CM | POA: Diagnosis not present

## 2018-01-15 DIAGNOSIS — M1612 Unilateral primary osteoarthritis, left hip: Secondary | ICD-10-CM | POA: Diagnosis present

## 2018-01-15 DIAGNOSIS — M160 Bilateral primary osteoarthritis of hip: Secondary | ICD-10-CM | POA: Diagnosis present

## 2018-01-15 DIAGNOSIS — Z953 Presence of xenogenic heart valve: Secondary | ICD-10-CM | POA: Diagnosis not present

## 2018-01-15 DIAGNOSIS — D638 Anemia in other chronic diseases classified elsewhere: Secondary | ICD-10-CM | POA: Diagnosis present

## 2018-01-15 DIAGNOSIS — I110 Hypertensive heart disease with heart failure: Secondary | ICD-10-CM | POA: Diagnosis not present

## 2018-01-15 DIAGNOSIS — I712 Thoracic aortic aneurysm, without rupture: Secondary | ICD-10-CM | POA: Diagnosis present

## 2018-01-15 DIAGNOSIS — I5032 Chronic diastolic (congestive) heart failure: Secondary | ICD-10-CM | POA: Diagnosis not present

## 2018-01-15 DIAGNOSIS — D62 Acute posthemorrhagic anemia: Secondary | ICD-10-CM | POA: Diagnosis not present

## 2018-01-15 DIAGNOSIS — I339 Acute and subacute endocarditis, unspecified: Secondary | ICD-10-CM | POA: Diagnosis not present

## 2018-01-15 DIAGNOSIS — I330 Acute and subacute infective endocarditis: Secondary | ICD-10-CM | POA: Diagnosis present

## 2018-01-15 DIAGNOSIS — R918 Other nonspecific abnormal finding of lung field: Secondary | ICD-10-CM | POA: Diagnosis not present

## 2018-01-15 DIAGNOSIS — Z954 Presence of other heart-valve replacement: Secondary | ICD-10-CM | POA: Diagnosis not present

## 2018-01-15 DIAGNOSIS — E876 Hypokalemia: Secondary | ICD-10-CM | POA: Diagnosis not present

## 2018-01-15 DIAGNOSIS — B9561 Methicillin susceptible Staphylococcus aureus infection as the cause of diseases classified elsewhere: Secondary | ICD-10-CM | POA: Diagnosis not present

## 2018-01-15 DIAGNOSIS — Y838 Other surgical procedures as the cause of abnormal reaction of the patient, or of later complication, without mention of misadventure at the time of the procedure: Secondary | ICD-10-CM | POA: Diagnosis present

## 2018-01-15 DIAGNOSIS — R7881 Bacteremia: Secondary | ICD-10-CM | POA: Diagnosis not present

## 2018-01-15 DIAGNOSIS — Q211 Atrial septal defect: Secondary | ICD-10-CM | POA: Diagnosis not present

## 2018-01-15 DIAGNOSIS — R6510 Systemic inflammatory response syndrome (SIRS) of non-infectious origin without acute organ dysfunction: Secondary | ICD-10-CM | POA: Diagnosis not present

## 2018-01-15 DIAGNOSIS — I669 Occlusion and stenosis of unspecified cerebral artery: Secondary | ICD-10-CM | POA: Diagnosis not present

## 2018-01-15 DIAGNOSIS — D696 Thrombocytopenia, unspecified: Secondary | ICD-10-CM | POA: Diagnosis not present

## 2018-01-15 DIAGNOSIS — Y712 Prosthetic and other implants, materials and accessory cardiovascular devices associated with adverse incidents: Secondary | ICD-10-CM | POA: Diagnosis present

## 2018-01-15 DIAGNOSIS — I2510 Atherosclerotic heart disease of native coronary artery without angina pectoris: Secondary | ICD-10-CM | POA: Diagnosis present

## 2018-01-15 DIAGNOSIS — I38 Endocarditis, valve unspecified: Secondary | ICD-10-CM | POA: Diagnosis not present

## 2018-01-15 DIAGNOSIS — Z959 Presence of cardiac and vascular implant and graft, unspecified: Secondary | ICD-10-CM

## 2018-01-15 DIAGNOSIS — D72829 Elevated white blood cell count, unspecified: Secondary | ICD-10-CM | POA: Diagnosis not present

## 2018-01-15 DIAGNOSIS — Z79899 Other long term (current) drug therapy: Secondary | ICD-10-CM | POA: Diagnosis not present

## 2018-01-15 DIAGNOSIS — Z4509 Encounter for adjustment and management of other cardiac device: Secondary | ICD-10-CM | POA: Diagnosis not present

## 2018-01-15 DIAGNOSIS — Z01810 Encounter for preprocedural cardiovascular examination: Secondary | ICD-10-CM | POA: Diagnosis not present

## 2018-01-15 DIAGNOSIS — I7789 Other specified disorders of arteries and arterioles: Secondary | ICD-10-CM | POA: Diagnosis not present

## 2018-01-15 DIAGNOSIS — I959 Hypotension, unspecified: Secondary | ICD-10-CM | POA: Diagnosis not present

## 2018-01-15 DIAGNOSIS — I76 Septic arterial embolism: Secondary | ICD-10-CM | POA: Diagnosis present

## 2018-01-15 DIAGNOSIS — Z452 Encounter for adjustment and management of vascular access device: Secondary | ICD-10-CM | POA: Diagnosis not present

## 2018-01-15 DIAGNOSIS — Z950 Presence of cardiac pacemaker: Secondary | ICD-10-CM | POA: Diagnosis not present

## 2018-01-15 DIAGNOSIS — I358 Other nonrheumatic aortic valve disorders: Secondary | ICD-10-CM | POA: Diagnosis not present

## 2018-01-15 DIAGNOSIS — R509 Fever, unspecified: Secondary | ICD-10-CM | POA: Diagnosis not present

## 2018-01-15 DIAGNOSIS — I350 Nonrheumatic aortic (valve) stenosis: Secondary | ICD-10-CM | POA: Diagnosis not present

## 2018-01-15 DIAGNOSIS — R011 Cardiac murmur, unspecified: Secondary | ICD-10-CM | POA: Diagnosis not present

## 2018-01-15 DIAGNOSIS — J9811 Atelectasis: Secondary | ICD-10-CM | POA: Diagnosis not present

## 2018-01-15 DIAGNOSIS — I340 Nonrheumatic mitral (valve) insufficiency: Secondary | ICD-10-CM | POA: Diagnosis not present

## 2018-01-15 DIAGNOSIS — Z7982 Long term (current) use of aspirin: Secondary | ICD-10-CM | POA: Diagnosis not present

## 2018-01-15 DIAGNOSIS — B957 Other staphylococcus as the cause of diseases classified elsewhere: Secondary | ICD-10-CM | POA: Diagnosis present

## 2018-01-15 DIAGNOSIS — D649 Anemia, unspecified: Secondary | ICD-10-CM | POA: Diagnosis present

## 2018-01-15 DIAGNOSIS — R05 Cough: Secondary | ICD-10-CM | POA: Diagnosis not present

## 2018-01-15 DIAGNOSIS — I639 Cerebral infarction, unspecified: Secondary | ICD-10-CM | POA: Diagnosis not present

## 2018-01-15 DIAGNOSIS — Z98818 Other dental procedure status: Secondary | ICD-10-CM | POA: Diagnosis not present

## 2018-01-15 DIAGNOSIS — M25559 Pain in unspecified hip: Secondary | ICD-10-CM | POA: Diagnosis not present

## 2018-01-15 DIAGNOSIS — Q2112 Patent foramen ovale: Secondary | ICD-10-CM

## 2018-01-15 DIAGNOSIS — J90 Pleural effusion, not elsewhere classified: Secondary | ICD-10-CM | POA: Diagnosis not present

## 2018-01-15 DIAGNOSIS — R5383 Other fatigue: Secondary | ICD-10-CM | POA: Diagnosis not present

## 2018-01-15 DIAGNOSIS — A4101 Sepsis due to Methicillin susceptible Staphylococcus aureus: Secondary | ICD-10-CM | POA: Diagnosis present

## 2018-01-15 DIAGNOSIS — Z952 Presence of prosthetic heart valve: Secondary | ICD-10-CM

## 2018-01-15 DIAGNOSIS — I39 Endocarditis and heart valve disorders in diseases classified elsewhere: Secondary | ICD-10-CM | POA: Diagnosis not present

## 2018-01-15 DIAGNOSIS — N200 Calculus of kidney: Secondary | ICD-10-CM | POA: Diagnosis not present

## 2018-01-15 DIAGNOSIS — T826XXD Infection and inflammatory reaction due to cardiac valve prosthesis, subsequent encounter: Secondary | ICD-10-CM | POA: Diagnosis not present

## 2018-01-15 DIAGNOSIS — I351 Nonrheumatic aortic (valve) insufficiency: Secondary | ICD-10-CM | POA: Diagnosis not present

## 2018-01-15 DIAGNOSIS — R059 Cough, unspecified: Secondary | ICD-10-CM

## 2018-01-15 DIAGNOSIS — G8929 Other chronic pain: Secondary | ICD-10-CM | POA: Diagnosis not present

## 2018-01-15 DIAGNOSIS — Z8774 Personal history of (corrected) congenital malformations of heart and circulatory system: Secondary | ICD-10-CM | POA: Diagnosis not present

## 2018-01-15 HISTORY — DX: Septic arterial embolism: I76

## 2018-01-15 HISTORY — DX: Pilonidal cyst without abscess: L05.91

## 2018-01-15 HISTORY — DX: Presence of other heart-valve replacement: Z95.4

## 2018-01-15 LAB — COMPREHENSIVE METABOLIC PANEL
ALT: 18 U/L (ref 0–44)
ANION GAP: 8 (ref 5–15)
AST: 28 U/L (ref 15–41)
Albumin: 3.2 g/dL — ABNORMAL LOW (ref 3.5–5.0)
Alkaline Phosphatase: 70 U/L (ref 38–126)
BILIRUBIN TOTAL: 0.9 mg/dL (ref 0.3–1.2)
BUN: 19 mg/dL (ref 6–20)
CHLORIDE: 105 mmol/L (ref 98–111)
CO2: 24 mmol/L (ref 22–32)
Calcium: 9.5 mg/dL (ref 8.9–10.3)
Creatinine, Ser: 1.03 mg/dL (ref 0.61–1.24)
Glucose, Bld: 103 mg/dL — ABNORMAL HIGH (ref 70–99)
POTASSIUM: 4.1 mmol/L (ref 3.5–5.1)
Sodium: 137 mmol/L (ref 135–145)
TOTAL PROTEIN: 6.6 g/dL (ref 6.5–8.1)

## 2018-01-15 LAB — CBC WITH DIFFERENTIAL/PLATELET
Abs Immature Granulocytes: 0.11 10*3/uL — ABNORMAL HIGH (ref 0.00–0.07)
BASOS PCT: 1 %
Basophils Absolute: 0.1 10*3/uL (ref 0.0–0.1)
EOS ABS: 0.1 10*3/uL (ref 0.0–0.5)
EOS PCT: 1 %
HCT: 33.7 % — ABNORMAL LOW (ref 39.0–52.0)
Hemoglobin: 10.4 g/dL — ABNORMAL LOW (ref 13.0–17.0)
Immature Granulocytes: 1 %
Lymphocytes Relative: 6 %
Lymphs Abs: 1.1 10*3/uL (ref 0.7–4.0)
MCH: 26.7 pg (ref 26.0–34.0)
MCHC: 30.9 g/dL (ref 30.0–36.0)
MCV: 86.4 fL (ref 80.0–100.0)
MONOS PCT: 13 %
Monocytes Absolute: 2.2 10*3/uL — ABNORMAL HIGH (ref 0.1–1.0)
NEUTROS PCT: 78 %
NRBC: 0 % (ref 0.0–0.2)
Neutro Abs: 13.2 10*3/uL — ABNORMAL HIGH (ref 1.7–7.7)
PLATELETS: 210 10*3/uL (ref 150–400)
RBC: 3.9 MIL/uL — ABNORMAL LOW (ref 4.22–5.81)
RDW: 13.4 % (ref 11.5–15.5)
WBC: 16.8 10*3/uL — ABNORMAL HIGH (ref 4.0–10.5)

## 2018-01-15 LAB — INFLUENZA PANEL BY PCR (TYPE A & B)
INFLAPCR: NEGATIVE
Influenza B By PCR: NEGATIVE

## 2018-01-15 LAB — URINALYSIS, ROUTINE W REFLEX MICROSCOPIC
BILIRUBIN URINE: NEGATIVE
Glucose, UA: NEGATIVE mg/dL
Hgb urine dipstick: NEGATIVE
Ketones, ur: NEGATIVE mg/dL
Leukocytes, UA: NEGATIVE
Nitrite: NEGATIVE
PH: 7 (ref 5.0–8.0)
Protein, ur: NEGATIVE mg/dL
Specific Gravity, Urine: 1.01 (ref 1.005–1.030)

## 2018-01-15 LAB — I-STAT CG4 LACTIC ACID, ED: Lactic Acid, Venous: 1.54 mmol/L (ref 0.5–1.9)

## 2018-01-15 LAB — GROUP A STREP BY PCR: GROUP A STREP BY PCR: NOT DETECTED

## 2018-01-15 MED ORDER — PIPERACILLIN-TAZOBACTAM 3.375 G IVPB 30 MIN
3.3750 g | Freq: Once | INTRAVENOUS | Status: AC
  Administered 2018-01-15: 3.375 g via INTRAVENOUS
  Filled 2018-01-15 (×2): qty 50

## 2018-01-15 MED ORDER — SODIUM CHLORIDE 0.9 % IV BOLUS
500.0000 mL | Freq: Once | INTRAVENOUS | Status: AC
  Administered 2018-01-15: 500 mL via INTRAVENOUS

## 2018-01-15 MED ORDER — ACETAMINOPHEN 500 MG PO TABS
1000.0000 mg | ORAL_TABLET | Freq: Once | ORAL | Status: AC
  Administered 2018-01-15: 1000 mg via ORAL
  Filled 2018-01-15: qty 2

## 2018-01-15 MED ORDER — OSELTAMIVIR PHOSPHATE 75 MG PO CAPS
75.0000 mg | ORAL_CAPSULE | Freq: Once | ORAL | Status: AC
Start: 2018-01-15 — End: 2018-01-15
  Administered 2018-01-15: 75 mg via ORAL
  Filled 2018-01-15: qty 1

## 2018-01-15 MED ORDER — SODIUM CHLORIDE 0.9 % IV BOLUS
1000.0000 mL | Freq: Once | INTRAVENOUS | Status: AC
  Administered 2018-01-15: 1000 mL via INTRAVENOUS

## 2018-01-15 MED ORDER — SODIUM CHLORIDE 0.9 % IV BOLUS
1000.0000 mL | Freq: Once | INTRAVENOUS | Status: AC
Start: 2018-01-15 — End: 2018-01-15
  Administered 2018-01-15: 1000 mL via INTRAVENOUS

## 2018-01-15 MED ORDER — VANCOMYCIN HCL IN DEXTROSE 1-5 GM/200ML-% IV SOLN
1000.0000 mg | Freq: Once | INTRAVENOUS | Status: AC
  Administered 2018-01-15: 1000 mg via INTRAVENOUS
  Filled 2018-01-15: qty 200

## 2018-01-15 NOTE — ED Triage Notes (Signed)
Pt reports fever and body aches since Friday. States he had similar Sx 1 month ago. Reports having a flu shot in August

## 2018-01-15 NOTE — ED Notes (Signed)
Pt made aware that urine specimen is needed at this time. Urinal at bedside.

## 2018-01-15 NOTE — ED Provider Notes (Signed)
[facility]ERGENCY DEPARTMENT Provider Note   CSN: [id number] Arrival date & time: 01/15/18  1905     History   Chief Complaint Chief Complaint  Patient presents with  . Generalized Body Aches  . Fever    HPI Patrick Walter is a 62 y.o. male.  HPI Patient presents with 2 days of fever and chills.  Complains of generalized malaise.  Decreased appetite.  States he recently saw his son who has developed similar illness.  Denies cough shortness of breath.  No chest pain.  No abdominal pain.  No nausea, vomiting or diarrhea.  No recent overseas travel.  No new rashes.  No neck pain or stiffness. Past Medical History:  Diagnosis Date  . Aortic stenosis 07/24/2013  . Ascending aorta enlargement ([place])   . Bicuspid aortic valve   . Coronary artery disease   . Heart murmur   . Hx of echocardiogram    post AVR >> Echo (10/15):  Mild LVH, mod focal basal septal hypertrophy, EF 55-60%, Gr 1 DD, AVR ok (mean 15 mmHg), dilated ascending Aorta (44 mm), mod LAE  . Hypertension   . Patent foramen ovale 10/23/2013   Closed at the time of aortic valve replacement  . S/P aortic valve replacement with bioprosthetic valve 12/05/2013   25 mm [facility] Ease bovine pericardial tissue valve     Patient Active Problem List   Diagnosis Date Noted  . Prosthetic valve endocarditis ([place])   . Staphylococcus epidermidis bacteremia   . Normocytic anemia 01/16/2018  . Sepsis ([place]) 01/15/2018  . Hypercalcemia 12/07/2017  . Primary osteoarthritis of left hip 07/01/2016  . Ascending aorta enlargement ([place])   . Family history of cancer in father 12/27/2013  . S/P aortic valve replacement with bioprosthetic valve 12/05/2013  . Low back pain 07/24/2013  . Essential hypertension, benign 07/24/2013    Past Surgical History:  Procedure Laterality Date  . AORTIC VALVE REPLACEMENT N/A 12/05/2013   Procedure: AORTIC VALVE REPLACEMENT (AVR);  Surgeon: [name], MD;  Location: [facility];   Service: Open Heart Surgery;  Laterality: N/A;  . HAND SURGERY Left 07/1977  . INTRAOPERATIVE TRANSESOPHAGEAL ECHOCARDIOGRAM N/A 12/05/2013   Procedure: INTRAOPERATIVE TRANSESOPHAGEAL ECHOCARDIOGRAM;  Surgeon: [name], MD;  Location: [facility];  Service: Open Heart Surgery;  Laterality: N/A;  . LEFT AND RIGHT HEART CATHETERIZATION WITH CORONARY ANGIOGRAM N/A 11/08/2013   Procedure: LEFT AND RIGHT HEART CATHETERIZATION WITH CORONARY ANGIOGRAM;  Surgeon: Blane Ohara, MD;  Location: [facility] CATH LAB;  Service: Cardiovascular;  Laterality: N/A;  . PATENT FORAMEN OVALE CLOSURE N/A 12/05/2013   Procedure: PATENT FORAMEN OVALE CLOSURE;  Surgeon: [name], MD;  Location: [facility];  Service: Open Heart Surgery;  Laterality: N/A;  . TEE WITHOUT CARDIOVERSION N/A 10/23/2013   Procedure: TRANSESOPHAGEAL ECHOCARDIOGRAM (TEE);  Surgeon: Larey Dresser, MD;  Location: [facility];  Service: Cardiovascular;  Laterality: N/A;  . TEE WITHOUT CARDIOVERSION N/A 01/19/2018   Procedure: TRANSESOPHAGEAL ECHOCARDIOGRAM (TEE);  Surgeon: Sanda Klein, MD;  Location: [facility] ENDOSCOPY;  Service: Cardiovascular;  Laterality: N/A;  . VASECTOMY  06/2002        Home Medications    Prior to Admission medications   Medication Sig Start Date End Date Taking? Authorizing Provider  amoxicillin (AMOXIL) 500 MG tablet Take 4 tablets (2,000 mg) one hour prior to dental appointments. 11/16/17  Yes [name], MD  aspirin 81 MG EC tablet Take 1 tablet (81 mg total) by mouth daily. 05/28/14  Yes [name], MD  Boswellia-Glucosamine-Vit D (GLUCOSAMINE COMPLEX PO) Take 1 capsule by mouth daily.   Yes [provider]  celecoxib (CELEBREX) 200 MG capsule TAKE 1 TO 2 CAPSULES BY MOUTH EVERY DAY AS NEEDED FOR PAIN Patient taking differently: Take 200-400 mg by mouth daily as needed for mild pain.  10/31/17  Yes [name], MD  Coenzyme Q10 300 MG CAPS Take 1 capsule by mouth daily.   Yes [provider]  Multiple Vitamin (MULTIVITAMIN WITH MINERALS) TABS tablet Take 1 tablet by mouth daily.   Yes [provider]  Omega 3 1000 MG CAPS Take 3 capsules by mouth daily.   Yes [provider]  Probiotic Product (PROBIOTIC PO) Take 1 capsule by mouth daily.   Yes [provider]  TURMERIC PO Take 1 capsule by mouth daily.   Yes [provider]  triamcinolone cream (KENALOG) 0.1 % Apply 1 application topically 2 (two) times daily. Patient not taking: Reported on 01/16/2018 06/10/17   [name], MD    Family History Family History  Problem Relation Age of Onset  . Diabetes Mother   . Cancer Mother        colon  . Diabetes Father   . Cancer Father        colon  . Colon cancer Father 29  . Diabetes Paternal Grandmother   . Cancer Paternal Grandmother   . Colon cancer Paternal Grandmother 8  . Cancer Daughter        leukemia  . Heart attack Maternal Grandmother   . Hypertension Maternal Grandmother   . Diabetes Maternal Grandmother   . Cancer Maternal Grandmother   . Diabetes Maternal Grandfather   . Cancer Maternal Grandfather   . Stroke Paternal Grandfather   . Diabetes Paternal Grandfather   . Cancer Paternal Grandfather     Social History Social History   Tobacco Use  . Smoking status: Never Smoker  . Smokeless tobacco: Never Used  Substance Use Topics  . Alcohol use: Yes    Alcohol/week: 0.0 standard drinks    Comment: occ  . Drug use: No     Allergies   Patient has no known allergies.   Review of Systems Review of Systems  Constitutional: Positive for appetite change, chills, fatigue and fever.  HENT: Negative for congestion, sinus pressure, sinus pain, sore throat and trouble swallowing.   Eyes: Negative for visual disturbance.  Respiratory: Negative for cough and shortness of breath.   Cardiovascular: Negative for chest pain, palpitations and leg swelling.  Gastrointestinal: Negative for abdominal  pain, constipation, diarrhea, nausea and vomiting.  Genitourinary: Negative for dysuria and flank pain.  Musculoskeletal: Positive for back pain. Negative for neck pain and neck stiffness.  Skin: Negative for rash and wound.  Neurological: Negative for dizziness, syncope, weakness, light-headedness, numbness and headaches.  All other systems reviewed and are negative.    Physical Exam Updated Vital Signs BP 106/77 (BP Location: Left Arm)   Pulse 77   Temp 98.6 F (37 C) (Oral)   Resp 18   Ht 5\' 11"  (1.803 m)   Wt 80.7 kg   SpO2 96%   BMI 24.83 kg/m   Physical Exam  Constitutional: He is oriented to person, place, and time. He appears well-developed and well-nourished. No distress.  HENT:  Head: Normocephalic and atraumatic.  Mouth/Throat: Oropharynx is clear and moist.  Uvula is midline.  Mildly erythematous oropharynx.  Questionable white tonsillar patches.   Eyes: Pupils  are equal, round, and reactive to light. Conjunctivae and EOM are normal.  Neck: Normal range of motion. Neck supple.  No cervical lymphadenopathy.  No meningismus.  Cardiovascular: Normal rate and regular rhythm. Exam reveals no gallop and no friction rub.  Murmur heard. Pulmonary/Chest: Effort normal and breath sounds normal. No stridor. No respiratory distress. He has no wheezes. He has no rales. He exhibits no tenderness.  Abdominal: Soft. Bowel sounds are normal. There is no tenderness. There is no rebound and no guarding.  Musculoskeletal: Normal range of motion. He exhibits no edema or tenderness.  No midline thoracic or lumbar tenderness.  No CVA tenderness.  No lower extremity swelling, asymmetry or tenderness.  Neurological: He is alert and oriented to person, place, and time.  Moving all extremities without focal deficit.  Sensation intact.  Skin: Skin is warm and dry. Capillary refill takes less than 2 seconds. No rash noted. He is not diaphoretic. No erythema.  Psychiatric: He has a normal mood  and affect. His behavior is normal.  Nursing note and vitals reviewed.    ED Treatments / Results  Labs (all labs ordered are listed, but only abnormal results are displayed) Labs Reviewed  CULTURE, BLOOD (ROUTINE X 2) - Abnormal; Notable for the following components:      Result Value   Culture   (*)    Value: STAPHYLOCOCCUS EPIDERMIDIS SUSCEPTIBILITIES PERFORMED ON PREVIOUS CULTURE WITHIN THE LAST 5 DAYS. Performed at [facility], [place] [address], [place], [place] [address]    All other components within normal limits  CULTURE, BLOOD (ROUTINE X 2) - Abnormal; Notable for the following components:   Culture STAPHYLOCOCCUS EPIDERMIDIS (*)    All other components within normal limits  BLOOD CULTURE ID PANEL (REFLEXED) - Abnormal; Notable for the following components:   Staphylococcus species DETECTED (*)    All other components within normal limits  CBC WITH DIFFERENTIAL/PLATELET - Abnormal; Notable for the following components:   WBC 16.8 (*)    RBC 3.90 (*)    Hemoglobin 10.4 (*)    HCT 33.7 (*)    Neutro Abs 13.2 (*)    Monocytes Absolute 2.2 (*)    Abs Immature Granulocytes 0.11 (*)    All other components within normal limits  COMPREHENSIVE METABOLIC PANEL - Abnormal; Notable for the following components:   Glucose, Bld 103 (*)    Albumin 3.2 (*)    All other components within normal limits  BASIC METABOLIC PANEL - Abnormal; Notable for the following components:   Glucose, Bld 114 (*)    All other components within normal limits  HEPATIC FUNCTION PANEL - Abnormal; Notable for the following components:   Total Protein 5.7 (*)    Albumin 2.7 (*)    All other components within normal limits  CBC WITH DIFFERENTIAL/PLATELET - Abnormal; Notable for the following components:   WBC 16.0 (*)    RBC 3.41 (*)    Hemoglobin 9.1 (*)    HCT 29.8 (*)    Neutro Abs 12.1 (*)    Monocytes Absolute 2.0 (*)    Abs Immature Granulocytes 0.09 (*)    All other components within  normal limits  CBC - Abnormal; Notable for the following components:   WBC 16.8 (*)    RBC 3.30 (*)    Hemoglobin 8.9 (*)    HCT 28.9 (*)    All other components within normal limits  LACTATE DEHYDROGENASE - Abnormal; Notable for the following components:   LDH  384 (*)    All other components within normal limits  IRON AND TIBC - Abnormal; Notable for the following components:   Iron 13 (*)    TIBC 194 (*)    Saturation Ratios 7 (*)    All other components within normal limits  FERRITIN - Abnormal; Notable for the following components:   Ferritin 538 (*)    All other components within normal limits  RETICULOCYTES - Abnormal; Notable for the following components:   RBC. 3.31 (*)    All other components within normal limits  CBC WITH DIFFERENTIAL/PLATELET - Abnormal; Notable for the following components:   WBC 15.5 (*)    RBC 3.32 (*)    Hemoglobin 9.0 (*)    HCT 29.0 (*)    Neutro Abs 11.5 (*)    Monocytes Absolute 2.1 (*)    Abs Immature Granulocytes 0.14 (*)    All other components within normal limits  CBC - Abnormal; Notable for the following components:   WBC 14.8 (*)    RBC 3.35 (*)    Hemoglobin 8.8 (*)    HCT 29.2 (*)    All other components within normal limits  BASIC METABOLIC PANEL - Abnormal; Notable for the following components:   Glucose, Bld 106 (*)    Calcium 10.4 (*)    All other components within normal limits  COMPREHENSIVE METABOLIC PANEL - Abnormal; Notable for the following components:   Glucose, Bld 117 (*)    Calcium 10.4 (*)    Total Protein 6.3 (*)    Albumin 2.8 (*)    All other components within normal limits  CBC - Abnormal; Notable for the following components:   WBC 14.7 (*)    RBC 3.67 (*)    Hemoglobin 9.5 (*)    HCT 32.3 (*)    MCH 25.9 (*)    MCHC 29.4 (*)    All other components within normal limits  GROUP A STREP BY PCR  RESPIRATORY PANEL BY PCR  MRSA PCR SCREENING  CULTURE, BLOOD (ROUTINE X 2)  INFLUENZA PANEL BY PCR (TYPE  A & B)  URINALYSIS, ROUTINE W REFLEX MICROSCOPIC  HIV ANTIBODY (ROUTINE TESTING W REFLEX)  VITAMIN B12  FOLATE  CMV ANTIBODY, IGG (EIA)  PROCALCITONIN  CREATININE, SERUM  PROCALCITONIN  CREATININE, SERUM  PROCALCITONIN  OCCULT BLOOD X 1 CARD TO LAB, STOOL  I-STAT CG4 LACTIC ACID, ED  TYPE AND SCREEN  ABO/RH    EKG EKG Interpretation  Date/Time:  Sunday January 15 2018 19:57:10 EDT Ventricular Rate:  92 PR Interval:    QRS Duration: 110 QT Interval:  368 QTC Calculation: 456 R Axis:   17 Text Interpretation:  Sinus rhythm Prolonged PR interval Abnormal inferior Q waves Baseline wander in lead(s) V4 V5 Confirmed by Lacretia Leigh ([id number]) on 01/16/2018 7:27:04 PM   Radiology No results found.  Procedures Procedures (including critical care time)  Medications Ordered in ED Medications  acetaminophen (TYLENOL) tablet 650 mg (650 mg Oral Given 01/20/18 0320)    Or  acetaminophen (TYLENOL) suppository 650 mg ( Rectal See Alternative 01/20/18 0320)  ondansetron (ZOFRAN) tablet 4 mg (4 mg Oral Given 01/19/18 2114)    Or  ondansetron [facility]) injection 4 mg ( Intravenous See Alternative 01/19/18 2114)  enoxaparin (LOVENOX) injection 40 mg (40 mg Subcutaneous Not Given 01/19/18 2110)  0.9 %  sodium chloride infusion ( Intravenous Stopped 01/17/18 1201)  0.9 %  sodium chloride infusion ( Intravenous MAR Unhold 01/19/18 1352)  ceFAZolin (ANCEF)  IVPB 2g/100 mL premix (2 g Intravenous New Bag/Given 01/20/18 1050)  bisacodyl (DULCOLAX) EC tablet 5 mg (has no administration in time range)  gentamicin (GARAMYCIN) IVPB 80 mg (has no administration in time range)  rifampin (RIFADIN) capsule 300 mg (300 mg Oral Given 01/20/18 1222)  lidocaine (cardiac) 100 mg/68mL (XYLOCAINE) 100 MG/5ML injection 2% (has no administration in time range)  silver nitrate applicators applicator 3 Stick (has no administration in time range)  sodium chloride 0.9 % bolus 1,000 mL (0 mLs Intravenous Stopping  Infusion hung by another clincian 01/15/18 2119)  acetaminophen (TYLENOL) tablet 1,000 mg (1,000 mg Oral Given 01/15/18 1953)  sodium chloride 0.9 % bolus 1,000 mL (0 mLs Intravenous Stopped 01/15/18 2243)  piperacillin-tazobactam (ZOSYN) IVPB 3.375 g (0 g Intravenous Stopped 01/15/18 2211)  vancomycin (VANCOCIN) IVPB 1000 mg/200 mL premix (0 mg Intravenous Stopped 01/15/18 2313)  oseltamivir (TAMIFLU) capsule 75 mg (75 mg Oral Given 01/15/18 2134)  sodium chloride 0.9 % bolus 500 mL (0 mLs Intravenous Stopped 01/15/18 2314)  gentamicin (GARAMYCIN) IVPB 120 mg (120 mg Intravenous New Bag/Given 01/20/18 1221)   CRITICAL CARE Performed by: Julianne Rice Total critical care time: 30 minutes Critical care time was exclusive of separately billable procedures and treating other patients. Critical care was necessary to treat or prevent imminent or life-threatening deterioration. Critical care was time spent personally by me on the following activities: development of treatment plan with patient and/or surrogate as well as nursing, discussions with consultants, evaluation of patient's response to treatment, examination of patient, obtaining history from patient or surrogate, ordering and performing treatments and interventions, ordering and review of laboratory studies, ordering and review of radiographic studies, pulse oximetry and re-evaluation of patient's condition.  Initial Impression / Assessment and Plan / ED Course  I have reviewed the triage vital signs and the nursing notes.  Pertinent labs & imaging results that were available during my care of the patient were reviewed by me and considered in my medical decision making (see chart for details).     Blood pressure is borderline low.  Will start IV fluids.  Patient is otherwise well-appearing.  Persistently borderline low blood pressure despite IV fluids.  Blood culture sent.  Elevated white blood cell count but normal lactic acid.  Unsure  source of patient's infectious process.  Covered with broad-spectrum antibiotics.  Discussed with hospitalist who will fit patient in transfer. Final Clinical Impressions(s) / ED Diagnoses   Final diagnoses:  Sepsis without acute organ dysfunction, due to unspecified organism [facility])    ED Discharge Orders    None       Julianne Rice, MD 01/20/18 1511

## 2018-01-15 NOTE — ED Notes (Signed)
Pt continues to drink fluids at this time. Aware of need for urine specimen. NAD noted at this time.

## 2018-01-15 NOTE — ED Notes (Signed)
Report given to Peggye Form

## 2018-01-15 NOTE — Progress Notes (Addendum)
62 year old male with past medical history significant for aortic valve replacement around 2 years ago presenting with fever, leukocytosis but normal lactic acid and creatinine.  Was hypotensive with a white blood cell count of 17,000.  Negative strep and flu test.  Patient received 1 L of IV fluids and his blood pressure is 90/70.  Patient on IV antibiotics and Tamiflu.  Patient looks clinically good but at risk of deterioration.  Patient had a similar febrile illness around 1 month ago that resolved on its own.  His son at [place] had a febrile illness when he was visiting him.  Had a flu shot in August. U/A was negative .

## 2018-01-16 ENCOUNTER — Ambulatory Visit: Payer: 59 | Admitting: Cardiovascular Disease

## 2018-01-16 ENCOUNTER — Encounter (HOSPITAL_COMMUNITY): Payer: Self-pay | Admitting: Internal Medicine

## 2018-01-16 DIAGNOSIS — D72829 Elevated white blood cell count, unspecified: Secondary | ICD-10-CM

## 2018-01-16 DIAGNOSIS — D649 Anemia, unspecified: Secondary | ICD-10-CM | POA: Diagnosis present

## 2018-01-16 DIAGNOSIS — R6510 Systemic inflammatory response syndrome (SIRS) of non-infectious origin without acute organ dysfunction: Secondary | ICD-10-CM

## 2018-01-16 DIAGNOSIS — R7881 Bacteremia: Secondary | ICD-10-CM | POA: Insufficient documentation

## 2018-01-16 DIAGNOSIS — I959 Hypotension, unspecified: Secondary | ICD-10-CM

## 2018-01-16 DIAGNOSIS — B957 Other staphylococcus as the cause of diseases classified elsewhere: Secondary | ICD-10-CM | POA: Insufficient documentation

## 2018-01-16 LAB — RESPIRATORY PANEL BY PCR
Adenovirus: NOT DETECTED
Bordetella pertussis: NOT DETECTED
CORONAVIRUS 229E-RVPPCR: NOT DETECTED
CORONAVIRUS NL63-RVPPCR: NOT DETECTED
CORONAVIRUS OC43-RVPPCR: NOT DETECTED
Chlamydophila pneumoniae: NOT DETECTED
Coronavirus HKU1: NOT DETECTED
Influenza A: NOT DETECTED
Influenza B: NOT DETECTED
METAPNEUMOVIRUS-RVPPCR: NOT DETECTED
MYCOPLASMA PNEUMONIAE-RVPPCR: NOT DETECTED
PARAINFLUENZA VIRUS 1-RVPPCR: NOT DETECTED
PARAINFLUENZA VIRUS 2-RVPPCR: NOT DETECTED
Parainfluenza Virus 3: NOT DETECTED
Parainfluenza Virus 4: NOT DETECTED
Respiratory Syncytial Virus: NOT DETECTED
Rhinovirus / Enterovirus: NOT DETECTED

## 2018-01-16 LAB — BLOOD CULTURE ID PANEL (REFLEXED)
ACINETOBACTER BAUMANNII: NOT DETECTED
Candida albicans: NOT DETECTED
Candida glabrata: NOT DETECTED
Candida krusei: NOT DETECTED
Candida parapsilosis: NOT DETECTED
Candida tropicalis: NOT DETECTED
ENTEROCOCCUS SPECIES: NOT DETECTED
Enterobacter cloacae complex: NOT DETECTED
Enterobacteriaceae species: NOT DETECTED
Escherichia coli: NOT DETECTED
Haemophilus influenzae: NOT DETECTED
Klebsiella oxytoca: NOT DETECTED
Klebsiella pneumoniae: NOT DETECTED
LISTERIA MONOCYTOGENES: NOT DETECTED
METHICILLIN RESISTANCE: NOT DETECTED
Neisseria meningitidis: NOT DETECTED
PSEUDOMONAS AERUGINOSA: NOT DETECTED
Proteus species: NOT DETECTED
SERRATIA MARCESCENS: NOT DETECTED
STAPHYLOCOCCUS AUREUS BCID: NOT DETECTED
STAPHYLOCOCCUS SPECIES: DETECTED — AB
STREPTOCOCCUS AGALACTIAE: NOT DETECTED
Streptococcus pneumoniae: NOT DETECTED
Streptococcus pyogenes: NOT DETECTED
Streptococcus species: NOT DETECTED

## 2018-01-16 LAB — CBC WITH DIFFERENTIAL/PLATELET
Abs Immature Granulocytes: 0.09 10*3/uL — ABNORMAL HIGH (ref 0.00–0.07)
BASOS ABS: 0.1 10*3/uL (ref 0.0–0.1)
Basophils Relative: 0 %
Eosinophils Absolute: 0.2 10*3/uL (ref 0.0–0.5)
Eosinophils Relative: 1 %
HEMATOCRIT: 29.8 % — AB (ref 39.0–52.0)
Hemoglobin: 9.1 g/dL — ABNORMAL LOW (ref 13.0–17.0)
IMMATURE GRANULOCYTES: 1 %
LYMPHS ABS: 1.5 10*3/uL (ref 0.7–4.0)
Lymphocytes Relative: 10 %
MCH: 26.7 pg (ref 26.0–34.0)
MCHC: 30.5 g/dL (ref 30.0–36.0)
MCV: 87.4 fL (ref 80.0–100.0)
MONOS PCT: 13 %
Monocytes Absolute: 2 10*3/uL — ABNORMAL HIGH (ref 0.1–1.0)
NRBC: 0 % (ref 0.0–0.2)
Neutro Abs: 12.1 10*3/uL — ABNORMAL HIGH (ref 1.7–7.7)
Neutrophils Relative %: 75 %
Platelets: 177 10*3/uL (ref 150–400)
RBC: 3.41 MIL/uL — ABNORMAL LOW (ref 4.22–5.81)
RDW: 13.2 % (ref 11.5–15.5)
WBC: 16 10*3/uL — ABNORMAL HIGH (ref 4.0–10.5)

## 2018-01-16 LAB — HEPATIC FUNCTION PANEL
ALT: 17 U/L (ref 0–44)
AST: 21 U/L (ref 15–41)
Albumin: 2.7 g/dL — ABNORMAL LOW (ref 3.5–5.0)
Alkaline Phosphatase: 54 U/L (ref 38–126)
BILIRUBIN TOTAL: 1 mg/dL (ref 0.3–1.2)
Bilirubin, Direct: 0.2 mg/dL (ref 0.0–0.2)
Indirect Bilirubin: 0.8 mg/dL (ref 0.3–0.9)
Total Protein: 5.7 g/dL — ABNORMAL LOW (ref 6.5–8.1)

## 2018-01-16 LAB — BASIC METABOLIC PANEL
ANION GAP: 7 (ref 5–15)
BUN: 16 mg/dL (ref 6–20)
CALCIUM: 9 mg/dL (ref 8.9–10.3)
CO2: 24 mmol/L (ref 22–32)
Chloride: 108 mmol/L (ref 98–111)
Creatinine, Ser: 0.83 mg/dL (ref 0.61–1.24)
Glucose, Bld: 114 mg/dL — ABNORMAL HIGH (ref 70–99)
POTASSIUM: 4.3 mmol/L (ref 3.5–5.1)
Sodium: 139 mmol/L (ref 135–145)

## 2018-01-16 LAB — RETICULOCYTES
IMMATURE RETIC FRACT: 10.8 % (ref 2.3–15.9)
RBC.: 3.31 MIL/uL — ABNORMAL LOW (ref 4.22–5.81)
RETIC COUNT ABSOLUTE: 45.7 10*3/uL (ref 19.0–186.0)
Retic Ct Pct: 1.4 % (ref 0.4–3.1)

## 2018-01-16 LAB — IRON AND TIBC
IRON: 13 ug/dL — AB (ref 45–182)
Saturation Ratios: 7 % — ABNORMAL LOW (ref 17.9–39.5)
TIBC: 194 ug/dL — ABNORMAL LOW (ref 250–450)
UIBC: 181 ug/dL

## 2018-01-16 LAB — LACTATE DEHYDROGENASE: LDH: 384 U/L — ABNORMAL HIGH (ref 98–192)

## 2018-01-16 LAB — PROCALCITONIN: Procalcitonin: 0.2 ng/mL

## 2018-01-16 LAB — TYPE AND SCREEN
ABO/RH(D): A NEG
ANTIBODY SCREEN: NEGATIVE

## 2018-01-16 LAB — CBC
HEMATOCRIT: 28.9 % — AB (ref 39.0–52.0)
HEMOGLOBIN: 8.9 g/dL — AB (ref 13.0–17.0)
MCH: 27 pg (ref 26.0–34.0)
MCHC: 30.8 g/dL (ref 30.0–36.0)
MCV: 87.6 fL (ref 80.0–100.0)
NRBC: 0 % (ref 0.0–0.2)
Platelets: 176 10*3/uL (ref 150–400)
RBC: 3.3 MIL/uL — ABNORMAL LOW (ref 4.22–5.81)
RDW: 13.6 % (ref 11.5–15.5)
WBC: 16.8 10*3/uL — ABNORMAL HIGH (ref 4.0–10.5)

## 2018-01-16 LAB — FOLATE: Folate: 15.3 ng/mL (ref >5.9)

## 2018-01-16 LAB — ABO/RH: ABO/RH(D): A NEG

## 2018-01-16 LAB — VITAMIN B12: VITAMIN B 12: 631 pg/mL (ref 180–914)

## 2018-01-16 LAB — MRSA PCR SCREENING: MRSA by PCR: NEGATIVE

## 2018-01-16 LAB — FERRITIN: FERRITIN: 538 ng/mL — AB (ref 24–336)

## 2018-01-16 MED ORDER — ONDANSETRON HCL 4 MG PO TABS
4.0000 mg | ORAL_TABLET | Freq: Four times a day (QID) | ORAL | Status: DC | PRN
  Administered 2018-01-18 – 2018-01-19 (×3): 4 mg via ORAL
  Filled 2018-01-16 (×3): qty 1

## 2018-01-16 MED ORDER — ENOXAPARIN SODIUM 40 MG/0.4ML ~~LOC~~ SOLN
40.0000 mg | Freq: Every day | SUBCUTANEOUS | Status: DC
  Administered 2018-01-16: 40 mg via SUBCUTANEOUS
  Filled 2018-01-16 (×6): qty 0.4

## 2018-01-16 MED ORDER — PIPERACILLIN-TAZOBACTAM 3.375 G IVPB
3.3750 g | Freq: Three times a day (TID) | INTRAVENOUS | Status: DC
  Administered 2018-01-16 – 2018-01-17 (×4): 3.375 g via INTRAVENOUS
  Filled 2018-01-16 (×6): qty 50

## 2018-01-16 MED ORDER — VANCOMYCIN HCL 10 G IV SOLR
1750.0000 mg | INTRAVENOUS | Status: DC
  Administered 2018-01-16 – 2018-01-17 (×2): 1750 mg via INTRAVENOUS
  Filled 2018-01-16 (×2): qty 1750

## 2018-01-16 MED ORDER — SODIUM CHLORIDE 0.9 % IV SOLN
INTRAVENOUS | Status: AC
  Administered 2018-01-16: 02:00:00 via INTRAVENOUS

## 2018-01-16 MED ORDER — ACETAMINOPHEN 325 MG PO TABS
650.0000 mg | ORAL_TABLET | Freq: Four times a day (QID) | ORAL | Status: DC | PRN
  Administered 2018-01-17 – 2018-01-20 (×9): 650 mg via ORAL
  Filled 2018-01-16 (×10): qty 2

## 2018-01-16 MED ORDER — ONDANSETRON HCL 4 MG/2ML IJ SOLN
4.0000 mg | Freq: Four times a day (QID) | INTRAMUSCULAR | Status: DC | PRN
  Administered 2018-01-18 – 2018-01-20 (×3): 4 mg via INTRAVENOUS
  Filled 2018-01-16 (×2): qty 2

## 2018-01-16 MED ORDER — ACETAMINOPHEN 650 MG RE SUPP: 650.0000 mg | Freq: Four times a day (QID) | RECTAL | Status: DC | PRN

## 2018-01-16 MED ORDER — SODIUM CHLORIDE 0.9 % IV SOLN
INTRAVENOUS | Status: DC | PRN
  Administered 2018-01-16 – 2018-01-19 (×5): 250 mL via INTRAVENOUS

## 2018-01-16 NOTE — Progress Notes (Signed)
PHARMACY - PHYSICIAN COMMUNICATION CRITICAL VALUE ALERT - BLOOD CULTURE IDENTIFICATION (BCID)  Patrick Walter is an 62 y.o. male who presented to [facility] on 01/15/2018 with a chief complaint of fever & body aches.  Assessment:  Pt was admitted with SIRS-source unidentified at present.  Influenza PCR, CXR & UA were all negative.  PMH is significant for bioprosthetic valve replacement in 2015.    Name of physician (or Provider) Contacted: Brandon Melnick  Current antibiotics: Vancomycin & Zosyn  Changes to prescribed antibiotics recommended:  Recommended switch to Ancef 2gm IV q8h, however NP wants to wait & defer to AM team.  ECHO pending.   Results for orders placed or performed during the hospital encounter of 01/15/18  Blood Culture ID Panel (Reflexed) (Collected: 01/15/2018  8:15 PM)  Result Value Ref Range   Enterococcus species NOT DETECTED NOT DETECTED   Listeria monocytogenes NOT DETECTED NOT DETECTED   Staphylococcus species DETECTED (A) NOT DETECTED   Staphylococcus aureus (BCID) NOT DETECTED NOT DETECTED   Methicillin resistance NOT DETECTED NOT DETECTED   Streptococcus species NOT DETECTED NOT DETECTED   Streptococcus agalactiae NOT DETECTED NOT DETECTED   Streptococcus pneumoniae NOT DETECTED NOT DETECTED   Streptococcus pyogenes NOT DETECTED NOT DETECTED   Acinetobacter baumannii NOT DETECTED NOT DETECTED   Enterobacteriaceae species NOT DETECTED NOT DETECTED   Enterobacter cloacae complex NOT DETECTED NOT DETECTED   Escherichia coli NOT DETECTED NOT DETECTED   Klebsiella oxytoca NOT DETECTED NOT DETECTED   Klebsiella pneumoniae NOT DETECTED NOT DETECTED   Proteus species NOT DETECTED NOT DETECTED   Serratia marcescens NOT DETECTED NOT DETECTED   Haemophilus influenzae NOT DETECTED NOT DETECTED   Neisseria meningitidis NOT DETECTED NOT DETECTED   Pseudomonas aeruginosa NOT DETECTED NOT DETECTED   Candida albicans NOT DETECTED NOT DETECTED   Candida glabrata NOT  DETECTED NOT DETECTED   Candida krusei NOT DETECTED NOT DETECTED   Candida parapsilosis NOT DETECTED NOT DETECTED   Candida tropicalis NOT DETECTED NOT DETECTED    Biagio Borg 01/16/2018  8:15 PM

## 2018-01-16 NOTE — H&P (Addendum)
History and Physical    [name]:[id number] DOB: May 23, 1955 DOA: 01/15/2018  PCP: [name], MD  Patient coming from: Home.  Chief Complaint: Denies body ache and fever chills.  HPI: Patrick Walter is a 62 y.o. male with history of bicuspid aortic valve status post bioprosthetic valve replacement in 2015 and at that time patient also has had PFO closure also receives injection for right hip osteoarthritis last one was last month has had recently traveled to [place] last week for sports event presents to the ER with complaints of having fever and chills with generalized body ache for the last 2 days.  Denies any chest pain headache visual symptoms sore throat nausea vomiting diarrhea or productive cough or shortness of breath.  Since patient has been having persistent symptoms last 2 days patient comes to the ER.  ED Course: In the ER admits in [facility] patient was found to be hypotensive with a fever of 102 F.  Lactate was normal.  Chest x-ray unremarkable UA unremarkable.  Influenza PCR and strep throat were negative.  Blood cultures were obtained and patient started on empiric antibiotics.  Patient is also found to have anemia which appears to be new.  Review of Systems: As per HPI, rest all negative.   Past Medical History:  Diagnosis Date  . Aortic stenosis 07/24/2013  . Ascending aorta enlargement ([place])   . Bicuspid aortic valve   . Coronary artery disease   . Heart murmur   . Hx of echocardiogram    post AVR >> Echo (10/15):  Mild LVH, mod focal basal septal hypertrophy, EF 55-60%, Gr 1 DD, AVR ok (mean 15 mmHg), dilated ascending Aorta (44 mm), mod LAE  . Hypertension   . Patent foramen ovale 10/23/2013   Closed at the time of aortic valve replacement  . S/P aortic valve replacement with bioprosthetic valve 12/05/2013   25 mm [facility] Ease bovine pericardial tissue valve     Past Surgical History:  Procedure Laterality Date  . AORTIC VALVE  REPLACEMENT N/A 12/05/2013   Procedure: AORTIC VALVE REPLACEMENT (AVR);  Surgeon: [name], MD;  Location: [place];  Service: Open Heart Surgery;  Laterality: N/A;  . HAND SURGERY Left 07/1977  . INTRAOPERATIVE TRANSESOPHAGEAL ECHOCARDIOGRAM N/A 12/05/2013   Procedure: INTRAOPERATIVE TRANSESOPHAGEAL ECHOCARDIOGRAM;  Surgeon: [name], MD;  Location: [place];  Service: Open Heart Surgery;  Laterality: N/A;  . LEFT AND RIGHT HEART CATHETERIZATION WITH CORONARY ANGIOGRAM N/A 11/08/2013   Procedure: LEFT AND RIGHT HEART CATHETERIZATION WITH CORONARY ANGIOGRAM;  Surgeon: Blane Ohara, MD;  Location: [facility] CATH LAB;  Service: Cardiovascular;  Laterality: N/A;  . PATENT FORAMEN OVALE CLOSURE N/A 12/05/2013   Procedure: PATENT FORAMEN OVALE CLOSURE;  Surgeon: [name], MD;  Location: [place];  Service: Open Heart Surgery;  Laterality: N/A;  . TEE WITHOUT CARDIOVERSION N/A 10/23/2013   Procedure: TRANSESOPHAGEAL ECHOCARDIOGRAM (TEE);  Surgeon: Larey Dresser, MD;  Location: [facility] ENDOSCOPY;  Service: Cardiovascular;  Laterality: N/A;  . VASECTOMY  06/2002     reports that he has never smoked. He has never used smokeless tobacco. He reports that he drinks alcohol. He reports that he does not use drugs.  No Known Allergies  Family History  Problem Relation Age of Onset  . Diabetes Mother   . Cancer Mother        colon  . Diabetes Father   . Cancer Father        colon  .  Colon cancer Father 50  . Diabetes Paternal Grandmother   . Cancer Paternal Grandmother   . Colon cancer Paternal Grandmother 68  . Cancer Daughter        leukemia  . Heart attack Maternal Grandmother   . Hypertension Maternal Grandmother   . Diabetes Maternal Grandmother   . Cancer Maternal Grandmother   . Diabetes Maternal Grandfather   . Cancer Maternal Grandfather   . Stroke Paternal Grandfather   . Diabetes Paternal Grandfather   . Cancer Paternal Grandfather     Prior to Admission medications     Medication Sig Start Date End Date Taking? Authorizing Provider  aspirin 81 MG EC tablet Take 1 tablet (81 mg total) by mouth daily. 05/28/14  Yes [name], MD  Boswellia-Glucosamine-Vit D (GLUCOSAMINE COMPLEX PO) Take 1 capsule by mouth daily.   Yes [provider]  Coenzyme Q10 300 MG CAPS Take 1 capsule by mouth daily.   Yes [provider]  amoxicillin (AMOXIL) 500 MG tablet Take 4 tablets (2,000 mg) one hour prior to dental appointments. 11/16/17   [name], MD  celecoxib (CELEBREX) 200 MG capsule TAKE 1 TO 2 CAPSULES BY MOUTH EVERY DAY AS NEEDED FOR PAIN 10/31/17   [name], MD  Multiple Vitamin (MULTIVITAMIN WITH MINERALS) TABS tablet Take 1 tablet by mouth daily.    [provider]  Omega 3 1000 MG CAPS Take 3 capsules by mouth daily.    [provider]  Probiotic Product (PROBIOTIC PO) Take 1 capsule by mouth daily.    [provider]  triamcinolone cream (KENALOG) 0.1 % Apply 1 application topically 2 (two) times daily. 06/10/17   [name], MD  TURMERIC PO Take 1 capsule by mouth daily.    [provider]    Physical Exam: Vitals:   01/15/18 2230 01/15/18 2253 01/15/18 2300 01/16/18 0018  BP: 92/61 92/61 (!) 83/66 105/86  Pulse: 66 68 65 62  Resp: 20 20 (!) 7   Temp:  98 F (36.7 C)  98.2 F (36.8 C)  TempSrc:    Oral  SpO2: 96% (!) 87% 95% 100%  Weight:      Height:          Constitutional: Moderately built and nourished. Vitals:   01/15/18 2230 01/15/18 2253 01/15/18 2300 01/16/18 0018  BP: 92/61 92/61 (!) 83/66 105/86  Pulse: 66 68 65 62  Resp: 20 20 (!) 7   Temp:  98 F (36.7 C)  98.2 F (36.8 C)  TempSrc:    Oral  SpO2: 96% (!) 87% 95% 100%  Weight:      Height:       Eyes: Anicteric no pallor. ENMT: No discharge from the ears eyes nose or mouth. Neck: No mass or.  No neck rigidity.  No JVD appreciated. Respiratory: No rhonchi or crepitations. Cardiovascular:  S1-S2 heard no murmurs appreciated. Abdomen: Soft nontender bowel sounds present. Musculoskeletal: No edema.  No joint effusion. Skin: No rash. Neurologic: Alert awake oriented to time place and person.  Moves all extremities. Psychiatric: Appears normal.  Normal affect.   Labs on Admission: I have personally reviewed following labs and imaging studies  CBC: Recent Labs  Lab 01/15/18 2019  WBC 16.8*  NEUTROABS 13.2*  HGB 10.4*  HCT 33.7*  MCV 86.4  PLT 782   Basic Metabolic Panel: Recent Labs  Lab 01/15/18 2019  NA 137  K 4.1  CL 105  CO2 24  GLUCOSE 103*  BUN 19  CREATININE 1.03  CALCIUM 9.5   GFR: Estimated Creatinine Clearance: 81.2 mL/min (by C-G formula based on SCr of 1.03 mg/dL). Liver Function Tests: Recent Labs  Lab 01/15/18 2019  AST 28  ALT 18  ALKPHOS 70  BILITOT 0.9  PROT 6.6  ALBUMIN 3.2*   No results for input(s): LIPASE, AMYLASE in the last 168 hours. No results for input(s): AMMONIA in the last 168 hours. Coagulation Profile: No results for input(s): INR, PROTIME in the last 168 hours. Cardiac Enzymes: No results for input(s): CKTOTAL, CKMB, CKMBINDEX, TROPONINI in the last 168 hours. BNP (last 3 results) No results for input(s): PROBNP in the last 8760 hours. HbA1C: No results for input(s): HGBA1C in the last 72 hours. CBG: No results for input(s): GLUCAP in the last 168 hours. Lipid Profile: No results for input(s): CHOL, HDL, LDLCALC, TRIG, CHOLHDL, LDLDIRECT in the last 72 hours. Thyroid Function Tests: No results for input(s): TSH, T4TOTAL, FREET4, T3FREE, THYROIDAB in the last 72 hours. Anemia Panel: No results for input(s): VITAMINB12, FOLATE, FERRITIN, TIBC, IRON, RETICCTPCT in the last 72 hours. Urine analysis:    Component Value Date/Time   COLORURINE YELLOW 01/15/2018 2214   APPEARANCEUR CLEAR 01/15/2018 2214   LABSPEC 1.010 01/15/2018 2214   PHURINE 7.0 01/15/2018 2214   GLUCOSEU NEGATIVE 01/15/2018 2214   HGBUR  NEGATIVE 01/15/2018 2214   BILIRUBINUR NEGATIVE 01/15/2018 2214   KETONESUR NEGATIVE 01/15/2018 2214   PROTEINUR NEGATIVE 01/15/2018 2214   UROBILINOGEN 0.2 12/03/2013 1344   NITRITE NEGATIVE 01/15/2018 2214   LEUKOCYTESUR NEGATIVE 01/15/2018 2214   Sepsis Labs: @LABRCNTIP (procalcitonin:4,lacticidven:4) ) Recent Results (from the past 240 hour(s))  Group A Strep by PCR     Status: None   Collection Time: 01/15/18  8:19 PM  Result Value Ref Range Status   Group A Strep by PCR NOT DETECTED NOT DETECTED Final    Comment: Performed at [facility], [address] [place], [place] [address]     Radiological Exams on Admission: Dg Chest 2 View  Result Date: 01/15/2018 CLINICAL DATA:  Flu like symptoms with fever but without cough or congestion. EXAM: CHEST - 2 VIEW COMPARISON:  01/07/2014 FINDINGS: Slight ectasia of the thoracic aorta. Median sternotomy sutures are redemonstrated. Normal sized heart is noted. Clear lungs without pulmonary consolidation, effusion or pneumothorax. No overt pulmonary edema. No acute osseous abnormality. IMPRESSION: No active cardiopulmonary disease. Electronically Signed   By: Ashley Royalty M.D.   On: 01/15/2018 20:12    EKG: Independently reviewed.  Normal sinus rhythm.  Assessment/Plan Principal Problem:   SIRS (systemic inflammatory response syndrome) (HCC) Active Problems:   S/P aortic valve replacement with bioprosthetic valve   Hypercalcemia    1. SIRS -patient was febrile and hypotensive with leukocytosis.  Source is not clear.  Concerning for developing sepsis.  Has had injection in the right hip 1 month ago with no new changes and has chronic hip pain.  Will follow blood cultures and if cultures are positive will need to get a 2D echo given the bioprosthetic valve and PFO closure and also further imaging of hip.  Patient is able to move his hip without any difficulty.  Continue with IV fluids empiric antibiotics. 2. Normocytic  normochromic anemia appears to be new -follow CBC check anemia panel type and screen check stool for occult blood.  Check LDH for any hemolytic process. 3. History of osteoarthritis of the right hip he gets injections. 4. History of bicuspid aortic  valve status post bioprosthetic valve replacement and PFO closure.  Due for cardiology visit next week.  Last 2D echo showed mildly elevated mean gradient.  Follows with Dr. Burt Knack. 5. Aortic dilatation being followed by Dr. Roxy Manns.   DVT prophylaxis: Lovenox. Code Status: Full code. Family Communication: Patient's wife. Disposition Plan: Home. Consults called: None. Admission status: Inpatient.   Rise Patience MD [facility] Pager [phone].  If 7PM-7AM, please contact night-coverage [email or web address] Password [email or web address]  01/16/2018, 1:26 AM

## 2018-01-16 NOTE — Progress Notes (Signed)
Pharmacy Antibiotic Note  Patrick Walter is a 62 y.o. male admitted on 01/15/2018 with sepsis.  Pharmacy has been consulted for Vancomycin and Zosyn  dosing.  Plan: Zosyn 3.375g IV q8h (4 hour infusion).   Vancomycin 1gm iv x1, then ~10hr later vancomycin 1750mg  iv q24hr  Goal AUC = 400 - 500 for all indications, except meningitis (goal AUC > 500 and Cmin 15-20 mcg/mL)   Height: 5\' 11"  (180.3 cm) Weight: 178 lb (80.7 kg) IBW/kg (Calculated) : 75.3  Temp (24hrs), Avg:99.1 F (37.3 C), Min:98 F (36.7 C), Max:102.1 F (38.9 C)  Recent Labs  Lab 01/15/18 2019 01/15/18 2024  WBC 16.8*  --   CREATININE 1.03  --   LATICACIDVEN  --  1.54    Estimated Creatinine Clearance: 81.2 mL/min (by C-G formula based on SCr of 1.03 mg/dL).    No Known Allergies  Antimicrobials this admission: Vancomycin 01/15/2018 >> Zosyn 01/15/2018 >>   Dose adjustments this admission: -  Microbiology results: -  Thank you for allowing pharmacy to be a part of this patient's care.  Nani Skillern Crowford 01/16/2018 1:42 AM

## 2018-01-16 NOTE — Progress Notes (Signed)
[facility] PROGRESS NOTE    Progress Note  Patrick Walter  HYW:[id number] DOB: 10-21-1955 DOA: 01/15/2018 PCP: [name], MD     Brief Narrative:   Patrick Walter is an 62 y.o. male past medical history of bicuspid aortic valve status post bioprosthetic valve replacement 2015, status post PFO closure who received an injection for osteoarthritis 1 month prior to admission comes into the ED with fever chills body aches for the last 2 days.  In the ED he was found to be hypotensive with a temperature of 102 chest x-ray was unremarkable influenza PCR strep throat were negative he was started empirically on IV antibiotics.  Assessment/Plan:   SIRS (systemic inflammatory response syndrome) ([place]) Started empirically on 01/15/2018 on IV vancomycin and Zosyn. Influenza PCR and strep throat were negative. HIV is pending.  He denies any recent urological oral or gastroenterologic procedures. Blood cultures have been ordered.  2D echo is pending. He was fluid resuscitated and vitals have remained stable. His last temperature spike was at 7 PM has had none since then, he has a leukocytosis of 16 UA and chest x-ray shows no signs of infection.   Normocytic anemia: Anemia panel is pending, his hemoglobin in September 2019 was 13 on admission 9.1. Check FOBT's.  Essential hypertension, benign Continue to hold antihypertensive medication blood pressure has remained stable. KVO IV fluids.  S/P aortic valve replacement with bioprosthetic valve/Patent foramen ovale   DVT prophylaxis: lovenxo Family Communication:none Disposition Plan/Barrier to D/C: transfer to med-surg Code Status:     Code Status Orders  (From admission, onward)         Start     Ordered   01/16/18 0126  Full code  Continuous     01/16/18 0126        Code Status History    Date Active Date Inactive Code Status Order ID Comments User Context   12/05/2013 1357 12/09/2013 1537 Full Code  [id number]  [name], MD Inpatient   11/08/2013 0909 11/08/2013 1510 Full Code [id number]  [name], MD Inpatient        IV Access:    Peripheral IV   Procedures and diagnostic studies:   Dg Chest 2 View  Result Date: 01/15/2018 CLINICAL DATA:  Flu like symptoms with fever but without cough or congestion. EXAM: CHEST - 2 VIEW COMPARISON:  01/07/2014 FINDINGS: Slight ectasia of the thoracic aorta. Median sternotomy sutures are redemonstrated. Normal sized heart is noted. Clear lungs without pulmonary consolidation, effusion or pneumothorax. No overt pulmonary edema. No acute osseous abnormality. IMPRESSION: No active cardiopulmonary disease. Electronically Signed   By: Ashley Royalty M.D.   On: 01/15/2018 20:12     Medical Consultants:    None.  Anti-Infectives:   IV vancomycin and Zosyn  Subjective:    Patrick Walter he relates he is hungry he feels better compared to yesterday he denies any any respiratory or urinary symptoms.  Objective:    Vitals:   01/15/18 2300 01/16/18 0018 01/16/18 0400 01/16/18 0700  BP: (!) 83/66 105/86 129/77 132/80  Pulse: 65 62 70 84  Resp: (!) 7  (!) 22 (!) 29  Temp:  98.2 F (36.8 C) 99.2 F (37.3 C)   TempSrc:  Oral Oral   SpO2: 95% 100% 98% 95%  Weight:      Height:        Intake/Output Summary (Last 24 hours) at 01/16/2018 0734 Last data filed at 01/16/2018 9937 Gross per 24 hour  Intake 2832.28  ml  Output 650 ml  Net 2182.28 ml   Filed Weights   01/15/18 1923  Weight: 80.7 kg    Exam: General exam: In no acute distress. Respiratory system: Good air movement and clear to auscultation. Cardiovascular system: S1 & S2 heard, RRR.  Gastrointestinal system: Abdomen is nondistended, soft and nontender.  Central nervous system: Alert and oriented. No focal neurological deficits. Extremities: No pedal edema. Skin: No rashes or ulceration. Psychiatry: Judgement and insight appear normal. Mood & affect  appropriate.    Data Reviewed:    Labs: Basic Metabolic Panel: Recent Labs  Lab 01/15/18 2019 01/16/18 0352  NA 137 139  K 4.1 4.3  CL 105 108  CO2 24 24  GLUCOSE 103* 114*  BUN 19 16  CREATININE 1.03 0.83  CALCIUM 9.5 9.0   GFR Estimated Creatinine Clearance: 100.8 mL/min (by C-G formula based on SCr of 0.83 mg/dL). Liver Function Tests: Recent Labs  Lab 01/15/18 2019 01/16/18 0352  AST 28 21  ALT 18 17  ALKPHOS 70 54  BILITOT 0.9 1.0  PROT 6.6 5.7*  ALBUMIN 3.2* 2.7*   No results for input(s): LIPASE, AMYLASE in the last 168 hours. No results for input(s): AMMONIA in the last 168 hours. Coagulation profile No results for input(s): INR, PROTIME in the last 168 hours.  CBC: Recent Labs  Lab 01/15/18 2019 01/16/18 0352  WBC 16.8* 16.0*  NEUTROABS 13.2* 12.1*  HGB 10.4* 9.1*  HCT 33.7* 29.8*  MCV 86.4 87.4  PLT 210 177   Cardiac Enzymes: No results for input(s): CKTOTAL, CKMB, CKMBINDEX, TROPONINI in the last 168 hours. BNP (last 3 results) No results for input(s): PROBNP in the last 8760 hours. CBG: No results for input(s): GLUCAP in the last 168 hours. D-Dimer: No results for input(s): DDIMER in the last 72 hours. Hgb A1c: No results for input(s): HGBA1C in the last 72 hours. Lipid Profile: No results for input(s): CHOL, HDL, LDLCALC, TRIG, CHOLHDL, LDLDIRECT in the last 72 hours. Thyroid function studies: No results for input(s): TSH, T4TOTAL, T3FREE, THYROIDAB in the last 72 hours.  Invalid input(s): FREET3 Anemia work up: No results for input(s): VITAMINB12, FOLATE, FERRITIN, TIBC, IRON, RETICCTPCT in the last 72 hours. Sepsis Labs: Recent Labs  Lab 01/15/18 2019 01/15/18 2024 01/16/18 0352  WBC 16.8*  --  16.0*  LATICACIDVEN  --  1.54  --    Microbiology Recent Results (from the past 240 hour(s))  Group A Strep by PCR     Status: None   Collection Time: 01/15/18  8:19 PM  Result Value Ref Range Status   Group A Strep by PCR NOT  DETECTED NOT DETECTED Final    Comment: Performed at [facility], [address] [place], [place] [address]     Medications:   . enoxaparin (LOVENOX) injection  40 mg Subcutaneous QHS   Continuous Infusions: . sodium chloride 75 mL/hr at 01/16/18 0413  . sodium chloride 250 mL (01/16/18 0611)  . piperacillin-tazobactam (ZOSYN)  IV 3.375 g (01/16/18 0612)  . vancomycin       LOS: 1 day   Charlynne Cousins  [facility] Pager [phone]  *Please refer to [email or web address], password [email or web address] to get updated schedule on who will round on this patient, as hospitalists switch teams weekly. If 7PM-7AM, please contact night-coverage at [email or web address], password [email or web address] for any overnight needs.  01/16/2018, 7:34 AM

## 2018-01-17 ENCOUNTER — Other Ambulatory Visit (HOSPITAL_COMMUNITY): Payer: 59

## 2018-01-17 DIAGNOSIS — Z98818 Other dental procedure status: Secondary | ICD-10-CM

## 2018-01-17 DIAGNOSIS — M25559 Pain in unspecified hip: Secondary | ICD-10-CM

## 2018-01-17 DIAGNOSIS — R509 Fever, unspecified: Secondary | ICD-10-CM

## 2018-01-17 DIAGNOSIS — Z952 Presence of prosthetic heart valve: Secondary | ICD-10-CM

## 2018-01-17 DIAGNOSIS — G8929 Other chronic pain: Secondary | ICD-10-CM

## 2018-01-17 DIAGNOSIS — Z8774 Personal history of (corrected) congenital malformations of heart and circulatory system: Secondary | ICD-10-CM

## 2018-01-17 DIAGNOSIS — B9561 Methicillin susceptible Staphylococcus aureus infection as the cause of diseases classified elsewhere: Secondary | ICD-10-CM

## 2018-01-17 DIAGNOSIS — R011 Cardiac murmur, unspecified: Secondary | ICD-10-CM

## 2018-01-17 DIAGNOSIS — R7881 Bacteremia: Secondary | ICD-10-CM

## 2018-01-17 LAB — CBC WITH DIFFERENTIAL/PLATELET
Abs Immature Granulocytes: 0.14 10*3/uL — ABNORMAL HIGH (ref 0.00–0.07)
BASOS PCT: 0 %
Basophils Absolute: 0 10*3/uL (ref 0.0–0.1)
EOS ABS: 0.1 10*3/uL (ref 0.0–0.5)
Eosinophils Relative: 1 %
HCT: 29 % — ABNORMAL LOW (ref 39.0–52.0)
Hemoglobin: 9 g/dL — ABNORMAL LOW (ref 13.0–17.0)
IMMATURE GRANULOCYTES: 1 %
Lymphocytes Relative: 10 %
Lymphs Abs: 1.6 10*3/uL (ref 0.7–4.0)
MCH: 27.1 pg (ref 26.0–34.0)
MCHC: 31 g/dL (ref 30.0–36.0)
MCV: 87.3 fL (ref 80.0–100.0)
Monocytes Absolute: 2.1 10*3/uL — ABNORMAL HIGH (ref 0.1–1.0)
Monocytes Relative: 14 %
NEUTROS PCT: 74 %
NRBC: 0 % (ref 0.0–0.2)
Neutro Abs: 11.5 10*3/uL — ABNORMAL HIGH (ref 1.7–7.7)
PLATELETS: 196 10*3/uL (ref 150–400)
RBC: 3.32 MIL/uL — ABNORMAL LOW (ref 4.22–5.81)
RDW: 13.5 % (ref 11.5–15.5)
WBC: 15.5 10*3/uL — ABNORMAL HIGH (ref 4.0–10.5)

## 2018-01-17 LAB — CMV ANTIBODY, IGG (EIA)

## 2018-01-17 LAB — HIV ANTIBODY (ROUTINE TESTING W REFLEX): HIV Screen 4th Generation wRfx: NONREACTIVE

## 2018-01-17 LAB — CREATININE, SERUM
Creatinine, Ser: 0.87 mg/dL (ref 0.61–1.24)
GFR calc non Af Amer: >60 mL/min (ref >60)

## 2018-01-17 LAB — PROCALCITONIN: Procalcitonin: 0.16 ng/mL

## 2018-01-17 MED ORDER — SODIUM CHLORIDE 0.9 % IV SOLN
2.0000 g | INTRAVENOUS | Status: DC
  Administered 2018-01-17: 2 g via INTRAVENOUS
  Filled 2018-01-17: qty 2

## 2018-01-17 MED ORDER — CEFAZOLIN SODIUM-DEXTROSE 2-4 GM/100ML-% IV SOLN
2.0000 g | Freq: Three times a day (TID) | INTRAVENOUS | Status: DC
  Administered 2018-01-17 – 2018-01-26 (×26): 2 g via INTRAVENOUS
  Filled 2018-01-17 (×30): qty 100

## 2018-01-17 NOTE — Consult Note (Addendum)
[facility] for Infectious Disease    Date of Admission:  01/15/2018   Total days of antibiotics: 2 vanco/zosyn --> ceftriaxone               Reason for Consult: bacteremia/MSSE, prosthetic valve    Referring Provider: Olevia Bowens   Assessment: MSSE bacteremia Bioprosthetic Ao valve  Plan: 1. Will change ceftriaxone to ancef 2. Will repeat his BCx  3. Will call CV for TEE (they will see, called)  Comment- The etiology of his bacteremia is unclear (dental procedure, hip injection?). He has multiple positive BCx and a bioprosthetic valve, he is at high risk for IE.   Thank you so much for this interesting consult,  Principal Problem:   Sepsis due to methicillin susceptible Staphylococcus aureus ([place]) Active Problems:   Essential hypertension, benign   S/P aortic valve replacement with bioprosthetic valve   Patent foramen ovale   Hypercalcemia   Normocytic anemia   Bacteremia due to Staphylococcus aureus   . enoxaparin (LOVENOX) injection  40 mg Subcutaneous QHS    HPI: Patrick Walter is a 62 y.o. male with hx of bicuspid Ao valve then bioprosthetic AVR and PFO closure 2015. Comes to ED on 10-27 with 48 of f/c that did not improve with tylenol. He further relates he has had fatigue for last 2-3 weeks.  In ED he was hypotensive with temp 102. His WBC was 16.8. He was started on vanco/zosyn.  He is now found to have 4/4 BCx MSSE. His anbx were changed to ceftriaxone.   His hx is also notable for chronic hip pain and steroid injection 1 month ago. He has had no further hip pain.  He had dental work in August and took 2g of amoxil prior to procedure.    Review of Systems: Review of Systems  Constitutional: Positive for fever. Negative for chills.  HENT: Negative for sore throat.   Respiratory: Negative for cough and shortness of breath.   Cardiovascular: Negative for chest pain and leg swelling.  Gastrointestinal: Negative for constipation and diarrhea.    Genitourinary: Negative for dysuria.  Musculoskeletal: Negative for joint pain.  Please see HPI. All other systems reviewed and negative.   Past Medical History:  Diagnosis Date  . Aortic stenosis 07/24/2013  . Ascending aorta enlargement ([place])   . Bicuspid aortic valve   . Coronary artery disease   . Heart murmur   . Hx of echocardiogram    post AVR >> Echo (10/15):  Mild LVH, mod focal basal septal hypertrophy, EF 55-60%, Gr 1 DD, AVR ok (mean 15 mmHg), dilated ascending Aorta (44 mm), mod LAE  . Hypertension   . Patent foramen ovale 10/23/2013   Closed at the time of aortic valve replacement  . S/P aortic valve replacement with bioprosthetic valve 12/05/2013   25 mm [facility] Ease bovine pericardial tissue valve     Social History   Tobacco Use  . Smoking status: Never Smoker  . Smokeless tobacco: Never Used  Substance Use Topics  . Alcohol use: Yes    Alcohol/week: 0.0 standard drinks    Comment: occ  . Drug use: No    Family History  Problem Relation Age of Onset  . Diabetes Mother   . Cancer Mother        colon  . Diabetes Father   . Cancer Father        colon  . Colon cancer Father 48  . Diabetes  Paternal Grandmother   . Cancer Paternal Grandmother   . Colon cancer Paternal Grandmother 55  . Cancer Daughter        leukemia  . Heart attack Maternal Grandmother   . Hypertension Maternal Grandmother   . Diabetes Maternal Grandmother   . Cancer Maternal Grandmother   . Diabetes Maternal Grandfather   . Cancer Maternal Grandfather   . Stroke Paternal Grandfather   . Diabetes Paternal Grandfather   . Cancer Paternal Grandfather      Medications:  Scheduled: . enoxaparin (LOVENOX) injection  40 mg Subcutaneous QHS    Abtx:  Anti-infectives (From admission, onward)   Start     Dose/Rate Route Frequency Ordered Stop   01/17/18 1030  cefTRIAXone (ROCEPHIN) 2 g in sodium chloride 0.9 % 100 mL IVPB     2 g 200 mL/hr over 30 Minutes Intravenous Every 24  hours 01/17/18 0919     01/16/18 0800  vancomycin (VANCOCIN) 1,750 mg in sodium chloride 0.9 % 500 mL IVPB  Status:  Discontinued     1,750 mg 250 mL/hr over 120 Minutes Intravenous Every 24 hours 01/16/18 0136 01/17/18 0919   01/16/18 0600  piperacillin-tazobactam (ZOSYN) IVPB 3.375 g  Status:  Discontinued     3.375 g 12.5 mL/hr over 240 Minutes Intravenous Every 8 hours 01/16/18 0136 01/17/18 0918   01/15/18 2130  piperacillin-tazobactam (ZOSYN) IVPB 3.375 g     3.375 g 100 mL/hr over 30 Minutes Intravenous  Once 01/15/18 2123 01/15/18 2211   01/15/18 2130  vancomycin (VANCOCIN) IVPB 1000 mg/200 mL premix     1,000 mg 200 mL/hr over 60 Minutes Intravenous  Once 01/15/18 2123 01/15/18 2313   01/15/18 2130  oseltamivir (TAMIFLU) capsule 75 mg     75 mg Oral  Once 01/15/18 2123 01/15/18 2134        OBJECTIVE: Blood pressure 117/79, pulse 77, temperature 99.6 F (37.6 C), resp. rate 16, height 5' 11"  (1.803 m), weight 80.7 kg, SpO2 97 %.  Physical Exam  Constitutional: He is oriented to person, place, and time. He appears well-developed and well-nourished.  HENT:  Mouth/Throat: No oropharyngeal exudate.  Eyes: Pupils are equal, round, and reactive to light. EOM are normal.  Neck: Normal range of motion. Neck supple.  Cardiovascular: Normal rate and regular rhythm.  Murmur heard. Pulmonary/Chest: Effort normal and breath sounds normal.  Abdominal: Soft. Bowel sounds are normal. He exhibits no distension. There is no tenderness.  Musculoskeletal: He exhibits no edema.  Lymphadenopathy:    He has no cervical adenopathy.  Neurological: He is alert and oriented to person, place, and time.  Skin: Skin is warm and dry. No rash noted.  Psychiatric: He has a normal mood and affect.    Lab Results Results for orders placed or performed during the hospital encounter of 01/15/18 (from the past 48 hour(s))  Culture, blood (Routine X 2) w Reflex to ID Panel     Status: None (Preliminary  result)   Collection Time: 01/15/18  8:15 PM  Result Value Ref Range   Specimen Description      BLOOD RIGHT HAND Performed at [facility], [address] [place], [place] [address]    Special Requests      BOTTLES DRAWN AEROBIC AND ANAEROBIC Blood Culture adequate volume Performed at [facility], [address] [place], [place] [address]    Culture  Setup Time      IN BOTH AEROBIC AND ANAEROBIC BOTTLES  GRAM POSITIVE COCCI CRITICAL VALUE NOTED.  VALUE IS CONSISTENT WITH PREVIOUSLY REPORTED AND CALLED VALUE. Performed at [facility], [place] [address] [place], [place] [address]    Culture GRAM POSITIVE COCCI    Report Status PENDING   Culture, blood (Routine X 2) w Reflex to ID Panel     Status: None (Preliminary result)   Collection Time: 01/15/18  8:15 PM  Result Value Ref Range   Specimen Description      BLOOD RIGHT ARM Performed at [facility], [address] [place], [place] [address]    Special Requests      BOTTLES DRAWN AEROBIC AND ANAEROBIC Blood Culture adequate volume Performed at [facility], [address] [place], [place] [address]    Culture  Setup Time      GRAM POSITIVE COCCI IN BOTH AEROBIC AND ANAEROBIC BOTTLES CRITICAL RESULT CALLED TO, READ BACK BY AND VERIFIED WITH: Sheffield Slider [facility] 01/16/18 1937 JDW    Culture GRAM POSITIVE COCCI    Report Status PENDING   Blood Culture ID Panel (Reflexed)     Status: Abnormal   Collection Time: 01/15/18  8:15 PM  Result Value Ref Range   Enterococcus species NOT DETECTED NOT DETECTED   Listeria monocytogenes NOT DETECTED NOT DETECTED   Staphylococcus species DETECTED (A) NOT DETECTED    Comment: Methicillin (oxacillin) susceptible coagulase negative staphylococcus. Possible blood culture contaminant (unless isolated from more than one blood culture draw or clinical case suggests pathogenicity). No antibiotic treatment is indicated for blood  culture  contaminants. CRITICAL RESULT CALLED TO, READ BACK BY AND VERIFIED WITH: Henry Russel [facility] 01/16/18 1937 JDW    Staphylococcus aureus (BCID) NOT DETECTED NOT DETECTED   Methicillin resistance NOT DETECTED NOT DETECTED   Streptococcus species NOT DETECTED NOT DETECTED   Streptococcus agalactiae NOT DETECTED NOT DETECTED   Streptococcus pneumoniae NOT DETECTED NOT DETECTED   Streptococcus pyogenes NOT DETECTED NOT DETECTED   Acinetobacter baumannii NOT DETECTED NOT DETECTED   Enterobacteriaceae species NOT DETECTED NOT DETECTED   Enterobacter cloacae complex NOT DETECTED NOT DETECTED   Escherichia coli NOT DETECTED NOT DETECTED   Klebsiella oxytoca NOT DETECTED NOT DETECTED   Klebsiella pneumoniae NOT DETECTED NOT DETECTED   Proteus species NOT DETECTED NOT DETECTED   Serratia marcescens NOT DETECTED NOT DETECTED   Haemophilus influenzae NOT DETECTED NOT DETECTED   Neisseria meningitidis NOT DETECTED NOT DETECTED   Pseudomonas aeruginosa NOT DETECTED NOT DETECTED   Candida albicans NOT DETECTED NOT DETECTED   Candida glabrata NOT DETECTED NOT DETECTED   Candida krusei NOT DETECTED NOT DETECTED   Candida parapsilosis NOT DETECTED NOT DETECTED   Candida tropicalis NOT DETECTED NOT DETECTED    Comment: Performed at [facility] [address], [place], [place] [address]  CBC with Differential/Platelet     Status: Abnormal   Collection Time: 01/15/18  8:19 PM  Result Value Ref Range   WBC 16.8 (H) 4.0 - 10.5 K/uL   RBC 3.90 (L) 4.22 - 5.81 MIL/uL   Hemoglobin 10.4 (L) 13.0 - 17.0 g/dL   HCT 33.7 (L) 39.0 - 52.0 %   MCV 86.4 80.0 - 100.0 fL   MCH 26.7 26.0 - 34.0 pg   MCHC 30.9 30.0 - 36.0 g/dL   RDW 13.4 11.5 - 15.5 %   Platelets 210 150 - 400 K/uL   nRBC 0.0 0.0 - 0.2 %   Neutrophils Relative % 78 %  Neutro Abs 13.2 (H) 1.7 - 7.7 K/uL   Lymphocytes Relative 6 %   Lymphs Abs 1.1 0.7 - 4.0 K/uL   Monocytes Relative 13 %   Monocytes Absolute 2.2 (H) 0.1 - 1.0 K/uL    Eosinophils Relative 1 %   Eosinophils Absolute 0.1 0.0 - 0.5 K/uL   Basophils Relative 1 %   Basophils Absolute 0.1 0.0 - 0.1 K/uL   Immature Granulocytes 1 %   Abs Immature Granulocytes 0.11 (H) 0.00 - 0.07 K/uL    Comment: Performed at [facility], [address] [place], [place] [address]  Comprehensive metabolic panel     Status: Abnormal   Collection Time: 01/15/18  8:19 PM  Result Value Ref Range   Sodium 137 135 - 145 mmol/L   Potassium 4.1 3.5 - 5.1 mmol/L   Chloride 105 98 - 111 mmol/L   CO2 24 22 - 32 mmol/L   Glucose, Bld 103 (H) 70 - 99 mg/dL   BUN 19 6 - 20 mg/dL   Creatinine, Ser 1.03 0.61 - 1.24 mg/dL   Calcium 9.5 8.9 - 10.3 mg/dL   Total Protein 6.6 6.5 - 8.1 g/dL   Albumin 3.2 (L) 3.5 - 5.0 g/dL   AST 28 15 - 41 U/L   ALT 18 0 - 44 U/L   Alkaline Phosphatase 70 38 - 126 U/L   Total Bilirubin 0.9 0.3 - 1.2 mg/dL   GFR calc non Af Amer >60 >60 mL/min   GFR calc Af Amer >60 >60 mL/min    Comment: (NOTE) The eGFR has been calculated using the CKD EPI equation. This calculation has not been validated in all clinical situations. eGFR's persistently <60 mL/min signify possible Chronic Kidney Disease.    Anion gap 8 5 - 15    Comment: Performed at [facility], [address] [place], [place] [address]  Group A Strep by PCR     Status: None   Collection Time: 01/15/18  8:19 PM  Result Value Ref Range   Group A Strep by PCR NOT DETECTED NOT DETECTED    Comment: Performed at [facility], [address] [place], [place] [address]  Influenza panel by PCR (type A & B)     Status: None   Collection Time: 01/15/18  8:19 PM  Result Value Ref Range   Influenza A By PCR NEGATIVE NEGATIVE   Influenza B By PCR NEGATIVE NEGATIVE    Comment: (NOTE) The Xpert Xpress Flu assay is intended as an aid in the diagnosis of  influenza and should not be used as a sole basis for treatment.  This  assay is FDA approved for nasopharyngeal swab  specimens only. Nasal  washings and aspirates are unacceptable for Xpert Xpress Flu testing. Performed at [facility], [place] [address], [place], [place] [address]   I-Stat CG4 Lactic Acid, ED     Status: None   Collection Time: 01/15/18  8:24 PM  Result Value Ref Range   Lactic Acid, Venous 1.54 0.5 - 1.9 mmol/L  Urinalysis, Routine w reflex microscopic     Status: None   Collection Time: 01/15/18 10:14 PM  Result Value Ref Range   Color, Urine YELLOW YELLOW   APPearance CLEAR CLEAR   Specific Gravity, Urine 1.010 1.005 - 1.030   pH 7.0 5.0 - 8.0   Glucose, UA NEGATIVE NEGATIVE mg/dL   Hgb urine dipstick NEGATIVE NEGATIVE   Bilirubin Urine  NEGATIVE NEGATIVE   Ketones, ur NEGATIVE NEGATIVE mg/dL   Protein, ur NEGATIVE NEGATIVE mg/dL   Nitrite NEGATIVE NEGATIVE   Leukocytes, UA NEGATIVE NEGATIVE    Comment: Microscopic not done on urines with negative protein, blood, leukocytes, nitrite, or glucose < 500 mg/dL. Performed at [facility], [address] [place], [place] [address]   HIV antibody (Routine Testing)     Status: None   Collection Time: 01/16/18  3:52 AM  Result Value Ref Range   HIV Screen 4th Generation wRfx Non Reactive Non Reactive    Comment: (NOTE) Performed At: [facility] [place], [place] [id number] Rush Farmer MD [id number]   Basic metabolic panel     Status: Abnormal   Collection Time: 01/16/18  3:52 AM  Result Value Ref Range   Sodium 139 135 - 145 mmol/L   Potassium 4.3 3.5 - 5.1 mmol/L   Chloride 108 98 - 111 mmol/L   CO2 24 22 - 32 mmol/L   Glucose, Bld 114 (H) 70 - 99 mg/dL   BUN 16 6 - 20 mg/dL   Creatinine, Ser 0.83 0.61 - 1.24 mg/dL   Calcium 9.0 8.9 - 10.3 mg/dL   GFR calc non Af Amer >60 >60 mL/min   GFR calc Af Amer >60 >60 mL/min    Comment: (NOTE) The eGFR has been calculated using the CKD EPI equation. This calculation has not been validated in all clinical situations. eGFR's  persistently <60 mL/min signify possible Chronic Kidney Disease.    Anion gap 7 5 - 15    Comment: Performed at [facility], [place] [address], [place], [place] [address]  Hepatic function panel     Status: Abnormal   Collection Time: 01/16/18  3:52 AM  Result Value Ref Range   Total Protein 5.7 (L) 6.5 - 8.1 g/dL   Albumin 2.7 (L) 3.5 - 5.0 g/dL   AST 21 15 - 41 U/L   ALT 17 0 - 44 U/L   Alkaline Phosphatase 54 38 - 126 U/L   Total Bilirubin 1.0 0.3 - 1.2 mg/dL   Bilirubin, Direct 0.2 0.0 - 0.2 mg/dL   Indirect Bilirubin 0.8 0.3 - 0.9 mg/dL    Comment: Performed at [facility], [place] [address] [place], [place] [address]  CBC WITH DIFFERENTIAL     Status: Abnormal   Collection Time: 01/16/18  3:52 AM  Result Value Ref Range   WBC 16.0 (H) 4.0 - 10.5 K/uL   RBC 3.41 (L) 4.22 - 5.81 MIL/uL   Hemoglobin 9.1 (L) 13.0 - 17.0 g/dL   HCT 29.8 (L) 39.0 - 52.0 %   MCV 87.4 80.0 - 100.0 fL   MCH 26.7 26.0 - 34.0 pg   MCHC 30.5 30.0 - 36.0 g/dL   RDW 13.2 11.5 - 15.5 %   Platelets 177 150 - 400 K/uL   nRBC 0.0 0.0 - 0.2 %   Neutrophils Relative % 75 %   Neutro Abs 12.1 (H) 1.7 - 7.7 K/uL   Lymphocytes Relative 10 %   Lymphs Abs 1.5 0.7 - 4.0 K/uL   Monocytes Relative 13 %   Monocytes Absolute 2.0 (H) 0.1 - 1.0 K/uL   Eosinophils Relative 1 %   Eosinophils Absolute 0.2 0.0 - 0.5 K/uL   Basophils Relative 0 %   Basophils Absolute 0.1 0.0 - 0.1 K/uL   Immature Granulocytes 1 %   Abs Immature Granulocytes 0.09 (H) 0.00 - 0.07  K/uL    Comment: Performed at [facility], [place] [address] [place], [place] [address]  ABO/Rh     Status: None   Collection Time: 01/16/18  7:26 AM  Result Value Ref Range   ABO/RH(D)      A NEG Performed at [facility] [address] [place], [place] [address]   CBC     Status: Abnormal   Collection Time: 01/16/18  7:35 AM  Result Value Ref Range   WBC 16.8 (H) 4.0 - 10.5 K/uL     RBC 3.30 (L) 4.22 - 5.81 MIL/uL   Hemoglobin 8.9 (L) 13.0 - 17.0 g/dL   HCT 28.9 (L) 39.0 - 52.0 %   MCV 87.6 80.0 - 100.0 fL   MCH 27.0 26.0 - 34.0 pg   MCHC 30.8 30.0 - 36.0 g/dL   RDW 13.6 11.5 - 15.5 %   Platelets 176 150 - 400 K/uL   nRBC 0.0 0.0 - 0.2 %    Comment: Performed at [facility], [place] [address] [place], [place] [address]  Lactate dehydrogenase     Status: Abnormal   Collection Time: 01/16/18  7:35 AM  Result Value Ref Range   LDH 384 (H) 98 - 192 U/L    Comment: Performed at [facility], [place] [address] [place], [place] [address]  Type and screen [place]     Status: None   Collection Time: 01/16/18  7:35 AM  Result Value Ref Range   ABO/RH(D) A NEG    Antibody Screen NEG    Sample Expiration      01/19/2018 Performed at [facility], [place] [address], [place], [place] [address]   Vitamin B12     Status: None   Collection Time: 01/16/18  7:35 AM  Result Value Ref Range   Vitamin B-12 631 180 - 914 pg/mL    Comment: (NOTE) This assay is not validated for testing neonatal or myeloproliferative syndrome specimens for Vitamin B12 levels. Performed at [facility], [place] [address], [place], [place] [address]   Folate     Status: None   Collection Time: 01/16/18  7:35 AM  Result Value Ref Range   Folate 15.3 >5.9 ng/mL    Comment: Performed at [facility], [place] [address], [place], [place] [address]  Iron and TIBC     Status: Abnormal   Collection Time: 01/16/18  7:35 AM  Result Value Ref Range   Iron 13 (L) 45 - 182 ug/dL   TIBC 194 (L) 250 - 450 ug/dL   Saturation Ratios 7 (L) 17.9 - 39.5 %   UIBC 181 ug/dL    Comment: Performed at [facility], [place] [address], [place], [place] [address]  Ferritin     Status: Abnormal   Collection Time: 01/16/18  7:35 AM  Result Value Ref Range   Ferritin 538 (H) 24 - 336 ng/mL     Comment: Performed at [facility], [place] [address], [place], [place] [address]  Reticulocytes     Status: Abnormal   Collection Time: 01/16/18  7:35 AM  Result Value Ref Range   Retic Ct Pct 1.4 0.4 - 3.1 %   RBC. 3.31 (L) 4.22 - 5.81 MIL/uL   Retic Count, Absolute 45.7 19.0 - 186.0 K/uL   Immature Retic Fract 10.8 2.3 - 15.9 %    Comment: Performed at [facility], [address] [place], [place] [address]  Cmv antibody, IgG (EIA)     Status: None   Collection Time: 01/16/18  7:35 AM  Result Value Ref Range   CMV Ab - IgG <0.60 0.00 - 0.59 U/mL    Comment: (NOTE)                               Negative          <0.60                               Equivocal   0.60 - 0.69                               Positive          >0.69 Performed At: [facility] [place], [place] [id number] Rush Farmer MD [id number]   Respiratory Panel by PCR     Status: None   Collection Time: 01/16/18  7:52 AM  Result Value Ref Range   Adenovirus NOT DETECTED NOT DETECTED   Coronavirus 229E NOT DETECTED NOT DETECTED   Coronavirus HKU1 NOT DETECTED NOT DETECTED   Coronavirus NL63 NOT DETECTED NOT DETECTED   Coronavirus OC43 NOT DETECTED NOT DETECTED   Metapneumovirus NOT DETECTED NOT DETECTED   Rhinovirus / Enterovirus NOT DETECTED NOT DETECTED   Influenza A NOT DETECTED NOT DETECTED   Influenza B NOT DETECTED NOT DETECTED   Parainfluenza Virus 1 NOT DETECTED NOT DETECTED   Parainfluenza Virus 2 NOT DETECTED NOT DETECTED   Parainfluenza Virus 3 NOT DETECTED NOT DETECTED   Parainfluenza Virus 4 NOT DETECTED NOT DETECTED   Respiratory Syncytial Virus NOT DETECTED NOT DETECTED   Bordetella pertussis NOT DETECTED NOT DETECTED   Chlamydophila pneumoniae NOT DETECTED NOT DETECTED   Mycoplasma pneumoniae NOT DETECTED NOT DETECTED  MRSA PCR Screening     Status: None   Collection Time: 01/16/18 11:08 AM  Result Value Ref Range   MRSA by PCR  NEGATIVE NEGATIVE    Comment:        The GeneXpert MRSA Assay (FDA approved for NASAL specimens only), is one component of a comprehensive MRSA colonization surveillance program. It is not intended to diagnose MRSA infection nor to guide or monitor treatment for MRSA infections. Performed at [facility], [place] [address], [place], [place] [address]   Procalcitonin - Baseline     Status: None   Collection Time: 01/16/18 11:54 AM  Result Value Ref Range   Procalcitonin 0.20 ng/mL    Comment:        Interpretation: PCT (Procalcitonin) <= 0.5 ng/mL: Systemic infection (sepsis) is not likely. Local bacterial infection is possible. (NOTE)       Sepsis PCT Algorithm           Lower Respiratory Tract                                      Infection PCT Algorithm    ----------------------------     ----------------------------         PCT < 0.25 ng/mL                PCT < 0.10 ng/mL  Strongly encourage             Strongly discourage   discontinuation of antibiotics    initiation of antibiotics    ----------------------------     -----------------------------       PCT 0.25 - 0.50 ng/mL            PCT 0.10 - 0.25 ng/mL               OR       >80% decrease in PCT            Discourage initiation of                                            antibiotics      Encourage discontinuation           of antibiotics    ----------------------------     -----------------------------         PCT >= 0.50 ng/mL              PCT 0.26 - 0.50 ng/mL               AND        <80% decrease in PCT             Encourage initiation of                                             antibiotics       Encourage continuation           of antibiotics    ----------------------------     -----------------------------        PCT >= 0.50 ng/mL                  PCT > 0.50 ng/mL               AND         increase in PCT                  Strongly encourage                                       initiation of antibiotics    Strongly encourage escalation           of antibiotics                                     -----------------------------                                           PCT <= 0.25 ng/mL                                                 OR                                        >  80% decrease in PCT                                     Discontinue / Do not initiate                                             antibiotics Performed at [place] [address], [place], [place] [address]   Creatinine, serum     Status: None   Collection Time: 01/17/18  6:44 AM  Result Value Ref Range   Creatinine, Ser 0.87 0.61 - 1.24 mg/dL   GFR calc non Af Amer >60 >60 mL/min   GFR calc Af Amer >60 >60 mL/min    Comment: (NOTE) The eGFR has been calculated using the CKD EPI equation. This calculation has not been validated in all clinical situations. eGFR's persistently <60 mL/min signify possible Chronic Kidney Disease. Performed at [facility], [place] [address] [place], [place] [address]   Procalcitonin     Status: None   Collection Time: 01/17/18  6:44 AM  Result Value Ref Range   Procalcitonin 0.16 ng/mL    Comment:        Interpretation: PCT (Procalcitonin) <= 0.5 ng/mL: Systemic infection (sepsis) is not likely. Local bacterial infection is possible. (NOTE)       Sepsis PCT Algorithm           Lower Respiratory Tract                                      Infection PCT Algorithm    ----------------------------     ----------------------------         PCT < 0.25 ng/mL                PCT < 0.10 ng/mL         Strongly encourage             Strongly discourage   discontinuation of antibiotics    initiation of antibiotics    ----------------------------     -----------------------------       PCT 0.25 - 0.50 ng/mL            PCT 0.10 - 0.25 ng/mL               OR       >80% decrease in PCT            Discourage initiation of                                             antibiotics      Encourage discontinuation           of antibiotics    ----------------------------     -----------------------------         PCT >= 0.50 ng/mL              PCT 0.26 - 0.50 ng/mL               AND        <80% decrease in PCT  Encourage initiation of                                             antibiotics       Encourage continuation           of antibiotics    ----------------------------     -----------------------------        PCT >= 0.50 ng/mL                  PCT > 0.50 ng/mL               AND         increase in PCT                  Strongly encourage                                      initiation of antibiotics    Strongly encourage escalation           of antibiotics                                     -----------------------------                                           PCT <= 0.25 ng/mL                                                 OR                                        > 80% decrease in PCT                                     Discontinue / Do not initiate                                             antibiotics Performed at [facility] [address] [place], [place] [address]   CBC with Differential/Platelet     Status: Abnormal   Collection Time: 01/17/18  6:44 AM  Result Value Ref Range   WBC 15.5 (H) 4.0 - 10.5 K/uL   RBC 3.32 (L) 4.22 - 5.81 MIL/uL   Hemoglobin 9.0 (L) 13.0 - 17.0 g/dL   HCT 29.0 (L) 39.0 - 52.0 %   MCV 87.3 80.0 - 100.0 fL   MCH 27.1 26.0 - 34.0 pg   MCHC 31.0 30.0 - 36.0 g/dL   RDW 13.5 11.5 - 15.5 %   Platelets 196 150 - 400 K/uL   nRBC 0.0 0.0 - 0.2 %   Neutrophils Relative %  74 %   Neutro Abs 11.5 (H) 1.7 - 7.7 K/uL   Lymphocytes Relative 10 %   Lymphs Abs 1.6 0.7 - 4.0 K/uL   Monocytes Relative 14 %   Monocytes Absolute 2.1 (H) 0.1 - 1.0 K/uL   Eosinophils Relative 1 %   Eosinophils Absolute 0.1 0.0 - 0.5 K/uL   Basophils Relative 0 %    Basophils Absolute 0.0 0.0 - 0.1 K/uL   Immature Granulocytes 1 %   Abs Immature Granulocytes 0.14 (H) 0.00 - 0.07 K/uL    Comment: Performed at [facility], [place] [address] [place], [place] [address]      Component Value Date/Time   SDES  01/15/2018 2015    BLOOD RIGHT HAND Performed at [facility], [address] [place], [place] [address]    SDES  01/15/2018 2015    BLOOD RIGHT ARM Performed at [facility], [address] [place], [place] [address]    [facility]  01/15/2018 2015    BOTTLES DRAWN AEROBIC AND ANAEROBIC Blood Culture adequate volume Performed at [facility], [address] [place], [place] [address]    [facility]  01/15/2018 2015    BOTTLES DRAWN AEROBIC AND ANAEROBIC Blood Culture adequate volume Performed at [facility], [address] [place], [place] [address]    CULT GRAM POSITIVE COCCI 01/15/2018 2015   CULT GRAM POSITIVE COCCI 01/15/2018 2015   REPTSTATUS PENDING 01/15/2018 2015   REPTSTATUS PENDING 01/15/2018 2015   Dg Chest 2 View  Result Date: 01/15/2018 CLINICAL DATA:  Flu like symptoms with fever but without cough or congestion. EXAM: CHEST - 2 VIEW COMPARISON:  01/07/2014 FINDINGS: Slight ectasia of the thoracic aorta. Median sternotomy sutures are redemonstrated. Normal sized heart is noted. Clear lungs without pulmonary consolidation, effusion or pneumothorax. No overt pulmonary edema. No acute osseous abnormality. IMPRESSION: No active cardiopulmonary disease. Electronically Signed   By: Ashley Royalty M.D.   On: 01/15/2018 20:12   Recent Results (from the past 240 hour(s))  Culture, blood (Routine X 2) w Reflex to ID Panel     Status: None (Preliminary result)   Collection Time: 01/15/18  8:15 PM  Result Value Ref Range Status   Specimen Description   Final    BLOOD RIGHT HAND Performed at [facility], [address] [place], [place] [address]    Special  Requests   Final    BOTTLES DRAWN AEROBIC AND ANAEROBIC Blood Culture adequate volume Performed at [facility], [address] [place], [place] [address]    Culture  Setup Time   Final    IN BOTH AEROBIC AND ANAEROBIC BOTTLES GRAM POSITIVE COCCI CRITICAL VALUE NOTED.  VALUE IS CONSISTENT WITH PREVIOUSLY REPORTED AND CALLED VALUE. Performed at [facility], [place] [address] [place],  [address]    Culture GRAM POSITIVE COCCI  Final   Report Status PENDING  Incomplete  Culture, blood (Routine X 2) w Reflex to ID Panel     Status: None (Preliminary result)   Collection Time: 01/15/18  8:15 PM  Result Value Ref Range Status   Specimen Description   Final    BLOOD RIGHT ARM Performed at [facility], [address] [place], [place] [address]    Special Requests   Final    BOTTLES DRAWN AEROBIC AND ANAEROBIC Blood Culture adequate volume Performed at [facility]  [address] [place] [place], [place] [address]    Culture  Setup Time   Final    GRAM POSITIVE COCCI IN BOTH AEROBIC AND ANAEROBIC BOTTLES CRITICAL RESULT CALLED TO, READ BACK BY AND VERIFIED WITH: Sheffield Slider [facility] 01/16/18 1937 JDW    Culture GRAM POSITIVE COCCI  Final   Report Status PENDING  Incomplete  Blood Culture ID Panel (Reflexed)     Status: Abnormal   Collection Time: 01/15/18  8:15 PM  Result Value Ref Range Status   Enterococcus species NOT DETECTED NOT DETECTED Final   Listeria monocytogenes NOT DETECTED NOT DETECTED Final   Staphylococcus species DETECTED (A) NOT DETECTED Final    Comment: Methicillin (oxacillin) susceptible coagulase negative staphylococcus. Possible blood culture contaminant (unless isolated from more than one blood culture draw or clinical case suggests pathogenicity). No antibiotic treatment is indicated for blood  culture contaminants. CRITICAL RESULT CALLED TO, READ BACK BY AND VERIFIED WITH: Henry Russel [facility] 01/16/18 1937 JDW     Staphylococcus aureus (BCID) NOT DETECTED NOT DETECTED Final   Methicillin resistance NOT DETECTED NOT DETECTED Final   Streptococcus species NOT DETECTED NOT DETECTED Final   Streptococcus agalactiae NOT DETECTED NOT DETECTED Final   Streptococcus pneumoniae NOT DETECTED NOT DETECTED Final   Streptococcus pyogenes NOT DETECTED NOT DETECTED Final   Acinetobacter baumannii NOT DETECTED NOT DETECTED Final   Enterobacteriaceae species NOT DETECTED NOT DETECTED Final   Enterobacter cloacae complex NOT DETECTED NOT DETECTED Final   Escherichia coli NOT DETECTED NOT DETECTED Final   Klebsiella oxytoca NOT DETECTED NOT DETECTED Final   Klebsiella pneumoniae NOT DETECTED NOT DETECTED Final   Proteus species NOT DETECTED NOT DETECTED Final   Serratia marcescens NOT DETECTED NOT DETECTED Final   Haemophilus influenzae NOT DETECTED NOT DETECTED Final   Neisseria meningitidis NOT DETECTED NOT DETECTED Final   Pseudomonas aeruginosa NOT DETECTED NOT DETECTED Final   Candida albicans NOT DETECTED NOT DETECTED Final   Candida glabrata NOT DETECTED NOT DETECTED Final   Candida krusei NOT DETECTED NOT DETECTED Final   Candida parapsilosis NOT DETECTED NOT DETECTED Final   Candida tropicalis NOT DETECTED NOT DETECTED Final    Comment: Performed at [facility], [place]. [address], [place],  [address]  Group A Strep by PCR     Status: None   Collection Time: 01/15/18  8:19 PM  Result Value Ref Range Status   Group A Strep by PCR NOT DETECTED NOT DETECTED Final    Comment: Performed at [facility], [address] [place], [place] [address]  Respiratory Panel by PCR     Status: None   Collection Time: 01/16/18  7:52 AM  Result Value Ref Range Status   Adenovirus NOT DETECTED NOT DETECTED Final   Coronavirus 229E NOT DETECTED NOT DETECTED Final   Coronavirus HKU1 NOT DETECTED NOT DETECTED Final   Coronavirus NL63 NOT DETECTED NOT DETECTED Final   Coronavirus OC43 NOT DETECTED NOT  DETECTED Final   Metapneumovirus NOT DETECTED NOT DETECTED Final   Rhinovirus / Enterovirus NOT DETECTED NOT DETECTED Final   Influenza A NOT DETECTED NOT DETECTED Final   Influenza B NOT DETECTED NOT DETECTED Final   Parainfluenza Virus 1 NOT DETECTED NOT DETECTED Final   Parainfluenza Virus 2 NOT DETECTED NOT DETECTED Final   Parainfluenza Virus 3 NOT DETECTED NOT DETECTED Final   Parainfluenza Virus 4 NOT DETECTED NOT DETECTED Final   Respiratory Syncytial Virus NOT DETECTED NOT DETECTED  Final   Bordetella pertussis NOT DETECTED NOT DETECTED Final   Chlamydophila pneumoniae NOT DETECTED NOT DETECTED Final   Mycoplasma pneumoniae NOT DETECTED NOT DETECTED Final  MRSA PCR Screening     Status: None   Collection Time: 01/16/18 11:08 AM  Result Value Ref Range Status   MRSA by PCR NEGATIVE NEGATIVE Final    Comment:        The GeneXpert MRSA Assay (FDA approved for NASAL specimens only), is one component of a comprehensive MRSA colonization surveillance program. It is not intended to diagnose MRSA infection nor to guide or monitor treatment for MRSA infections. Performed at [facility], [place] [address], [place], [place] [address]     Microbiology: Recent Results (from the past 240 hour(s))  Culture, blood (Routine X 2) w Reflex to ID Panel     Status: None (Preliminary result)   Collection Time: 01/15/18  8:15 PM  Result Value Ref Range Status   Specimen Description   Final    BLOOD RIGHT HAND Performed at [facility], [address] [place], [place] [address]    Special Requests   Final    BOTTLES DRAWN AEROBIC AND ANAEROBIC Blood Culture adequate volume Performed at [facility], [address] [place], [place] [address]    Culture  Setup Time   Final    IN BOTH AEROBIC AND ANAEROBIC BOTTLES GRAM POSITIVE COCCI CRITICAL VALUE NOTED.  VALUE IS CONSISTENT WITH PREVIOUSLY REPORTED AND CALLED VALUE. Performed at [facility], [place] [address] [place], [place] [address]    Culture GRAM POSITIVE COCCI  Final   Report Status PENDING  Incomplete  Culture, blood (Routine X 2) w Reflex to ID Panel     Status: None (Preliminary result)   Collection Time: 01/15/18  8:15 PM  Result Value Ref Range Status   Specimen Description   Final    BLOOD RIGHT ARM Performed at [facility], [address] [place], [place] [address]    Special Requests   Final    BOTTLES DRAWN AEROBIC AND ANAEROBIC Blood Culture adequate volume Performed at [facility], [address] [place], [place] [address]    Culture  Setup Time   Final    GRAM POSITIVE COCCI IN BOTH AEROBIC AND ANAEROBIC BOTTLES CRITICAL RESULT CALLED TO, READ BACK BY AND VERIFIED WITH: Sheffield Slider [facility] 01/16/18 1937 JDW    Culture GRAM POSITIVE COCCI  Final   Report Status PENDING  Incomplete  Blood Culture ID Panel (Reflexed)     Status: Abnormal   Collection Time: 01/15/18  8:15 PM  Result Value Ref Range Status   Enterococcus species NOT DETECTED NOT DETECTED Final   Listeria monocytogenes NOT DETECTED NOT DETECTED Final   Staphylococcus species DETECTED (A) NOT DETECTED Final    Comment: Methicillin (oxacillin) susceptible coagulase negative staphylococcus. Possible blood culture contaminant (unless isolated from more than one blood culture draw or clinical case suggests pathogenicity). No antibiotic treatment is indicated for blood  culture contaminants. CRITICAL RESULT CALLED TO, READ BACK BY AND VERIFIED WITH: Henry Russel [facility] 01/16/18 1937 JDW    Staphylococcus aureus (BCID) NOT DETECTED NOT DETECTED Final   Methicillin resistance NOT DETECTED NOT DETECTED Final   Streptococcus species NOT DETECTED NOT DETECTED Final   Streptococcus agalactiae NOT DETECTED NOT DETECTED Final   Streptococcus pneumoniae NOT DETECTED NOT DETECTED Final   Streptococcus pyogenes NOT DETECTED NOT  DETECTED Final   Acinetobacter baumannii NOT  DETECTED NOT DETECTED Final   Enterobacteriaceae species NOT DETECTED NOT DETECTED Final   Enterobacter cloacae complex NOT DETECTED NOT DETECTED Final   Escherichia coli NOT DETECTED NOT DETECTED Final   Klebsiella oxytoca NOT DETECTED NOT DETECTED Final   Klebsiella pneumoniae NOT DETECTED NOT DETECTED Final   Proteus species NOT DETECTED NOT DETECTED Final   Serratia marcescens NOT DETECTED NOT DETECTED Final   Haemophilus influenzae NOT DETECTED NOT DETECTED Final   Neisseria meningitidis NOT DETECTED NOT DETECTED Final   Pseudomonas aeruginosa NOT DETECTED NOT DETECTED Final   Candida albicans NOT DETECTED NOT DETECTED Final   Candida glabrata NOT DETECTED NOT DETECTED Final   Candida krusei NOT DETECTED NOT DETECTED Final   Candida parapsilosis NOT DETECTED NOT DETECTED Final   Candida tropicalis NOT DETECTED NOT DETECTED Final    Comment: Performed at [facility], [place] [address], [place], [place] [address]  Group A Strep by PCR     Status: None   Collection Time: 01/15/18  8:19 PM  Result Value Ref Range Status   Group A Strep by PCR NOT DETECTED NOT DETECTED Final    Comment: Performed at [facility], [address] [place], [place] [address]  Respiratory Panel by PCR     Status: None   Collection Time: 01/16/18  7:52 AM  Result Value Ref Range Status   Adenovirus NOT DETECTED NOT DETECTED Final   Coronavirus 229E NOT DETECTED NOT DETECTED Final   Coronavirus HKU1 NOT DETECTED NOT DETECTED Final   Coronavirus NL63 NOT DETECTED NOT DETECTED Final   Coronavirus OC43 NOT DETECTED NOT DETECTED Final   Metapneumovirus NOT DETECTED NOT DETECTED Final   Rhinovirus / Enterovirus NOT DETECTED NOT DETECTED Final   Influenza A NOT DETECTED NOT DETECTED Final   Influenza B NOT DETECTED NOT DETECTED Final   Parainfluenza Virus 1 NOT DETECTED NOT DETECTED Final   Parainfluenza Virus 2 NOT DETECTED NOT DETECTED Final   Parainfluenza Virus 3 NOT DETECTED NOT DETECTED  Final   Parainfluenza Virus 4 NOT DETECTED NOT DETECTED Final   Respiratory Syncytial Virus NOT DETECTED NOT DETECTED Final   Bordetella pertussis NOT DETECTED NOT DETECTED Final   Chlamydophila pneumoniae NOT DETECTED NOT DETECTED Final   Mycoplasma pneumoniae NOT DETECTED NOT DETECTED Final  MRSA PCR Screening     Status: None   Collection Time: 01/16/18 11:08 AM  Result Value Ref Range Status   MRSA by PCR NEGATIVE NEGATIVE Final    Comment:        The GeneXpert MRSA Assay (FDA approved for NASAL specimens only), is one component of a comprehensive MRSA colonization surveillance program. It is not intended to diagnose MRSA infection nor to guide or monitor treatment for MRSA infections. Performed at [facility], [place] [address], [place], [place] [address]     Radiographs and labs were personally reviewed by me.   Bobby Rumpf, MD [facility] [place] [phone] 01/17/2018, 11:33 AM

## 2018-01-17 NOTE — Progress Notes (Signed)
    [facility] has been requested to perform a transesophageal echocardiogram on Patrick Walter for bacteremia.  After careful review of history and examination, the risks and benefits of transesophageal echocardiogram have been explained including risks of esophageal damage, perforation (1:10,000 risk), bleeding, pharyngeal hematoma as well as other potential complications associated with conscious sedation including aspiration, arrhythmia, respiratory failure and death. Alternatives to treatment were discussed, questions were answered. Patient is willing to proceed.   Pt is scheduled for TEE on 01/19/18 with Dr. Sallyanne Kuster at 11 AM. NPO at [facility] Wed night. Please arrange [facility].  Tami Lin Haddie Bruhl, [place]  01/17/2018 2:45 PM

## 2018-01-17 NOTE — Progress Notes (Addendum)
[facility] PROGRESS NOTE    Progress Note  Patrick Walter  XNA:[id number] DOB: 29-Jul-1955 DOA: 01/15/2018 PCP: [name], MD     Brief Narrative:   Patrick Walter is an 62 y.o. male past medical history of bicuspid aortic valve status post bioprosthetic valve replacement 2015, status post PFO closure who received an injection for osteoarthritis 1 month prior to admission comes into the ED with fever chills body aches for the last 2 days.  In the ED he was found to be hypotensive with a temperature of 102 chest x-ray was unremarkable influenza PCR strep throat were negative he was started empirically on IV antibiotics.  Assessment/Plan:   SIRS (systemic inflammatory response syndrome) ([place]) Started empirically on 01/15/2018 on IV vancomycin and Zosyn. Influenza PCR and strep throat were negative. HIV is nonreactive he denies any recent urological oral or gastroenterologic procedures. Blood  reflex ID panel was positive for staph, blood cultures are pending, procalcitonin is high. Discontinue IV vancomycin and Zosyn, start IV Rocephin.  Consult infectious disease. Remain afebrile CBC this morning is pending.  Normocytic anemia: Anemia panel shows a ferritin of 500 likely an acute phase reactant. This is pending this morning  Essential hypertension, benign Continue to hold antihypertensive medication blood pressure has remained stable. KVO IV fluids.  S/P aortic valve replacement with bioprosthetic valve/Patent foramen ovale   DVT prophylaxis: lovenxo Family Communication:none Disposition Plan/Barrier to D/C: transfer to med-surg Code Status:     Code Status Orders  (From admission, onward)         Start     Ordered   01/16/18 0126  Full code  Continuous     01/16/18 0126        Code Status History    Date Active Date Inactive Code Status Order ID Comments User Context   12/05/2013 1357 12/09/2013 1537 Full Code [id number]  [name], MD  Inpatient   11/08/2013 0909 11/08/2013 1510 Full Code [id number]  [name], MD Inpatient        IV Access:    Peripheral IV   Procedures and diagnostic studies:   Dg Chest 2 View  Result Date: 01/15/2018 CLINICAL DATA:  Flu like symptoms with fever but without cough or congestion. EXAM: CHEST - 2 VIEW COMPARISON:  01/07/2014 FINDINGS: Slight ectasia of the thoracic aorta. Median sternotomy sutures are redemonstrated. Normal sized heart is noted. Clear lungs without pulmonary consolidation, effusion or pneumothorax. No overt pulmonary edema. No acute osseous abnormality. IMPRESSION: No active cardiopulmonary disease. Electronically Signed   By: Ashley Royalty M.D.   On: 01/15/2018 20:12     Medical Consultants:    None.  Anti-Infectives:   IV vancomycin and Zosyn  Subjective:    [name] feeling better no new complaints.  Objective:    Vitals:   01/16/18 1015 01/16/18 1413 01/16/18 2043 01/17/18 0528  BP: 113/80 113/72 106/71 117/79  Pulse: 86 97 85 77  Resp: 20 18 19 16   Temp: 98.5 F (36.9 C) 100.2 F (37.9 C) 99.5 F (37.5 C) 99.6 F (37.6 C)  TempSrc:      SpO2: 99% 98% 98% 97%  Weight:      Height:        Intake/Output Summary (Last 24 hours) at 01/17/2018 0910 Last data filed at 01/17/2018 0558 Gross per 24 hour  Intake 1219.29 ml  Output -  Net 1219.29 ml   Filed Weights   01/15/18 1923  Weight: 80.7 kg    Exam: General  exam: In no acute distress. Respiratory system: Good air movement and clear to auscultation. Cardiovascular system: S1 & S2 heard, RRR.  Gastrointestinal system: Abdomen is nondistended, soft and nontender.  Central nervous system: Alert and oriented. No focal neurological deficits. Extremities: No pedal edema. Skin: No rashes or ulceration. Psychiatry: Judgement and insight appear normal. Mood & affect appropriate.    Data Reviewed:    Labs: Basic Metabolic Panel: Recent Labs  Lab 01/15/18 2019  01/16/18 0352 01/17/18 0644  NA 137 139  --   K 4.1 4.3  --   CL 105 108  --   CO2 24 24  --   GLUCOSE 103* 114*  --   BUN 19 16  --   CREATININE 1.03 0.83 0.87  CALCIUM 9.5 9.0  --    GFR Estimated Creatinine Clearance: 96.2 mL/min (by C-G formula based on SCr of 0.87 mg/dL). Liver Function Tests: Recent Labs  Lab 01/15/18 2019 01/16/18 0352  AST 28 21  ALT 18 17  ALKPHOS 70 54  BILITOT 0.9 1.0  PROT 6.6 5.7*  ALBUMIN 3.2* 2.7*   No results for input(s): LIPASE, AMYLASE in the last 168 hours. No results for input(s): AMMONIA in the last 168 hours. Coagulation profile No results for input(s): INR, PROTIME in the last 168 hours.  CBC: Recent Labs  Lab 01/15/18 2019 01/16/18 0352 01/16/18 0735  WBC 16.8* 16.0* 16.8*  NEUTROABS 13.2* 12.1*  --   HGB 10.4* 9.1* 8.9*  HCT 33.7* 29.8* 28.9*  MCV 86.4 87.4 87.6  PLT 210 177 176   Cardiac Enzymes: No results for input(s): CKTOTAL, CKMB, CKMBINDEX, TROPONINI in the last 168 hours. BNP (last 3 results) No results for input(s): PROBNP in the last 8760 hours. CBG: No results for input(s): GLUCAP in the last 168 hours. D-Dimer: No results for input(s): DDIMER in the last 72 hours. Hgb A1c: No results for input(s): HGBA1C in the last 72 hours. Lipid Profile: No results for input(s): CHOL, HDL, LDLCALC, TRIG, CHOLHDL, LDLDIRECT in the last 72 hours. Thyroid function studies: No results for input(s): TSH, T4TOTAL, T3FREE, THYROIDAB in the last 72 hours.  Invalid input(s): FREET3 Anemia work up: Recent Labs    01/16/18 0735  VITAMINB12 631  FOLATE 15.3  FERRITIN 538*  TIBC 194*  IRON 13*  RETICCTPCT 1.4   Sepsis Labs: Recent Labs  Lab 01/15/18 2019 01/15/18 2024 01/16/18 0352 01/16/18 0735 01/16/18 1154 01/17/18 0644  PROCALCITON  --   --   --   --  0.20 0.16  WBC 16.8*  --  16.0* 16.8*  --   --   LATICACIDVEN  --  1.54  --   --   --   --    Microbiology Recent Results (from the past 240 hour(s))    Culture, blood (Routine X 2) w Reflex to ID Panel     Status: None (Preliminary result)   Collection Time: 01/15/18  8:15 PM  Result Value Ref Range Status   Specimen Description   Final    BLOOD RIGHT HAND Performed at [facility], [address] [place], [place] [address]    Special Requests   Final    BOTTLES DRAWN AEROBIC AND ANAEROBIC Blood Culture adequate volume Performed at [facility], [address] [place], [place] [address]    Culture  Setup Time   Final    IN BOTH AEROBIC AND ANAEROBIC BOTTLES GRAM POSITIVE COCCI CRITICAL VALUE NOTED.  VALUE  IS CONSISTENT WITH PREVIOUSLY REPORTED AND CALLED VALUE. Performed at [facility], [place] [address], [place], [place] [address]    Culture GRAM POSITIVE COCCI  Final   Report Status PENDING  Incomplete  Culture, blood (Routine X 2) w Reflex to ID Panel     Status: None (Preliminary result)   Collection Time: 01/15/18  8:15 PM  Result Value Ref Range Status   Specimen Description   Final    BLOOD RIGHT ARM Performed at [facility], [address] [place], [place] [address]    Special Requests   Final    BOTTLES DRAWN AEROBIC AND ANAEROBIC Blood Culture adequate volume Performed at [facility], [address] [place], [place] [address]    Culture  Setup Time   Final    GRAM POSITIVE COCCI IN BOTH AEROBIC AND ANAEROBIC BOTTLES CRITICAL RESULT CALLED TO, READ BACK BY AND VERIFIED WITH: Sheffield Slider [facility] 01/16/18 1937 JDW    Culture GRAM POSITIVE COCCI  Final   Report Status PENDING  Incomplete  Blood Culture ID Panel (Reflexed)     Status: Abnormal   Collection Time: 01/15/18  8:15 PM  Result Value Ref Range Status   Enterococcus species NOT DETECTED NOT DETECTED Final   Listeria monocytogenes NOT DETECTED NOT DETECTED Final   Staphylococcus species DETECTED (A) NOT DETECTED Final    Comment: Methicillin (oxacillin) susceptible coagulase negative staphylococcus. Possible  blood culture contaminant (unless isolated from more than one blood culture draw or clinical case suggests pathogenicity). No antibiotic treatment is indicated for blood  culture contaminants. CRITICAL RESULT CALLED TO, READ BACK BY AND VERIFIED WITH: Henry Russel [facility] 01/16/18 1937 JDW    Staphylococcus aureus (BCID) NOT DETECTED NOT DETECTED Final   Methicillin resistance NOT DETECTED NOT DETECTED Final   Streptococcus species NOT DETECTED NOT DETECTED Final   Streptococcus agalactiae NOT DETECTED NOT DETECTED Final   Streptococcus pneumoniae NOT DETECTED NOT DETECTED Final   Streptococcus pyogenes NOT DETECTED NOT DETECTED Final   Acinetobacter baumannii NOT DETECTED NOT DETECTED Final   Enterobacteriaceae species NOT DETECTED NOT DETECTED Final   Enterobacter cloacae complex NOT DETECTED NOT DETECTED Final   Escherichia coli NOT DETECTED NOT DETECTED Final   Klebsiella oxytoca NOT DETECTED NOT DETECTED Final   Klebsiella pneumoniae NOT DETECTED NOT DETECTED Final   Proteus species NOT DETECTED NOT DETECTED Final   Serratia marcescens NOT DETECTED NOT DETECTED Final   Haemophilus influenzae NOT DETECTED NOT DETECTED Final   Neisseria meningitidis NOT DETECTED NOT DETECTED Final   Pseudomonas aeruginosa NOT DETECTED NOT DETECTED Final   Candida albicans NOT DETECTED NOT DETECTED Final   Candida glabrata NOT DETECTED NOT DETECTED Final   Candida krusei NOT DETECTED NOT DETECTED Final   Candida parapsilosis NOT DETECTED NOT DETECTED Final   Candida tropicalis NOT DETECTED NOT DETECTED Final    Comment: Performed at [facility], [place]. [address], [place], [place] [address]  Group A Strep by PCR     Status: None   Collection Time: 01/15/18  8:19 PM  Result Value Ref Range Status   Group A Strep by PCR NOT DETECTED NOT DETECTED Final    Comment: Performed at [facility], [address] [place], [place] [address]  Respiratory Panel by PCR     Status: None   Collection  Time: 01/16/18  7:52 AM  Result Value Ref Range Status   Adenovirus NOT DETECTED NOT DETECTED  Final   Coronavirus 229E NOT DETECTED NOT DETECTED Final   Coronavirus HKU1 NOT DETECTED NOT DETECTED Final   Coronavirus NL63 NOT DETECTED NOT DETECTED Final   Coronavirus OC43 NOT DETECTED NOT DETECTED Final   Metapneumovirus NOT DETECTED NOT DETECTED Final   Rhinovirus / Enterovirus NOT DETECTED NOT DETECTED Final   Influenza A NOT DETECTED NOT DETECTED Final   Influenza B NOT DETECTED NOT DETECTED Final   Parainfluenza Virus 1 NOT DETECTED NOT DETECTED Final   Parainfluenza Virus 2 NOT DETECTED NOT DETECTED Final   Parainfluenza Virus 3 NOT DETECTED NOT DETECTED Final   Parainfluenza Virus 4 NOT DETECTED NOT DETECTED Final   Respiratory Syncytial Virus NOT DETECTED NOT DETECTED Final   Bordetella pertussis NOT DETECTED NOT DETECTED Final   Chlamydophila pneumoniae NOT DETECTED NOT DETECTED Final   Mycoplasma pneumoniae NOT DETECTED NOT DETECTED Final  MRSA PCR Screening     Status: None   Collection Time: 01/16/18 11:08 AM  Result Value Ref Range Status   MRSA by PCR NEGATIVE NEGATIVE Final    Comment:        The GeneXpert MRSA Assay (FDA approved for NASAL specimens only), is one component of a comprehensive MRSA colonization surveillance program. It is not intended to diagnose MRSA infection nor to guide or monitor treatment for MRSA infections. Performed at [facility], [place] [address] [place], [place] [address]      Medications:   . enoxaparin (LOVENOX) injection  40 mg Subcutaneous QHS   Continuous Infusions: . sodium chloride 250 mL (01/16/18 0611)  . piperacillin-tazobactam (ZOSYN)  IV 3.375 g (01/17/18 0527)  . vancomycin 1,750 mg (01/17/18 0825)     LOS: 2 days   [facility] Pager [phone]  *Please refer to [email or web address], password [email or web address] to get updated schedule on who will round on this patient, as hospitalists  switch teams weekly. If 7PM-7AM, please contact night-coverage at [email or web address], password [email or web address] for any overnight needs.  01/17/2018, 9:10 AM

## 2018-01-17 NOTE — Progress Notes (Signed)
[facility] Infusion Coordinator will follow pt with ID team once consult visit made in order to [facility] services for pt if ordered at DC to home.  If patient discharges after hours, please call [phone].   Patrick Walter 01/17/2018, 10:18 AM

## 2018-01-18 DIAGNOSIS — B957 Other staphylococcus as the cause of diseases classified elsewhere: Secondary | ICD-10-CM | POA: Diagnosis present

## 2018-01-18 DIAGNOSIS — M1612 Unilateral primary osteoarthritis, left hip: Secondary | ICD-10-CM

## 2018-01-18 DIAGNOSIS — R7881 Bacteremia: Secondary | ICD-10-CM | POA: Diagnosis present

## 2018-01-18 DIAGNOSIS — A4101 Sepsis due to Methicillin susceptible Staphylococcus aureus: Secondary | ICD-10-CM

## 2018-01-18 DIAGNOSIS — Z953 Presence of xenogenic heart valve: Secondary | ICD-10-CM

## 2018-01-18 DIAGNOSIS — Q211 Atrial septal defect: Secondary | ICD-10-CM

## 2018-01-18 LAB — CBC
HCT: 29.2 % — ABNORMAL LOW (ref 39.0–52.0)
Hemoglobin: 8.8 g/dL — ABNORMAL LOW (ref 13.0–17.0)
MCH: 26.3 pg (ref 26.0–34.0)
MCHC: 30.1 g/dL (ref 30.0–36.0)
MCV: 87.2 fL (ref 80.0–100.0)
PLATELETS: 251 10*3/uL (ref 150–400)
RBC: 3.35 MIL/uL — AB (ref 4.22–5.81)
RDW: 13.3 % (ref 11.5–15.5)
WBC: 14.8 10*3/uL — AB (ref 4.0–10.5)
nRBC: 0 % (ref 0.0–0.2)

## 2018-01-18 LAB — CREATININE, SERUM
Creatinine, Ser: 0.81 mg/dL (ref 0.61–1.24)
GFR calc non Af Amer: >60 mL/min (ref >60)

## 2018-01-18 LAB — PROCALCITONIN: PROCALCITONIN: 0.14 ng/mL

## 2018-01-18 NOTE — Progress Notes (Signed)
PROGRESS NOTE    Patrick Walter   MOQ:[id number]  DOB: 1955/11/25  DOA: 01/15/2018 PCP: Patrick Decamp, MD   Brief Narrative:  Patrick Walter is an 62 y.o. male past medical history of bicuspid aortic valve status post bioprosthetic valve replacement 2015, status post PFO closure who received an injection for osteoarthritis 1 month prior to admission comes into the ED with fever chills body aches for the last 2 days.  In the ED he was found to be hypotensive with a temperature of 102 chest x-ray was unremarkable influenza PCR strep throat were negative he was started empirically on IV antibiotics.   Subjective: No complaints. ROS: no complaints of nausea, vomiting, constipation diarrhea, cough, dyspnea or dysuria. No other complaints.   Assessment & Plan:   Principal Problem:   Sepsis due to methicillin susceptible Staphylococcus epidermidis   Bacteremia due to Staphylococcus aureus in setting of bioprosthetic AVR Influenza PCR and strep throat were negative. HIV is nonreactive he denies any recent urological oral or gastroenterologic procedures. - cont antibiotics per ID- Ancef - TEE tomorrow   Active Problems:     S/P aortic valve replacement with bioprosthetic valve    Patent foramen ovale   Ascending aorta enlargement    Moderate d CHF - per ECHO in 2018     Primary osteoarthritis of left hip - Celebrex on hold     Normocytic anemia- AOCD    DVT prophylaxis: Lovenox Code Status: Full code Family Communication:  Disposition Plan: TEE tomorrow- plan per ID Consultants:   ID Procedures:   none Antimicrobials:  Anti-infectives (From admission, onward)   Start     Dose/Rate Route Frequency Ordered Stop   01/17/18 1400  ceFAZolin (ANCEF) IVPB 2g/100 mL premix     2 g 200 mL/hr over 30 Minutes Intravenous Every 8 hours 01/17/18 1156     01/17/18 1030  cefTRIAXone (ROCEPHIN) 2 g in sodium chloride 0.9 % 100 mL IVPB  Status:  Discontinued     2  g 200 mL/hr over 30 Minutes Intravenous Every 24 hours 01/17/18 0919 01/17/18 1156   01/16/18 0800  vancomycin (VANCOCIN) 1,750 mg in sodium chloride 0.9 % 500 mL IVPB  Status:  Discontinued     1,750 mg 250 mL/hr over 120 Minutes Intravenous Every 24 hours 01/16/18 0136 01/17/18 0919   01/16/18 0600  piperacillin-tazobactam (ZOSYN) IVPB 3.375 g  Status:  Discontinued     3.375 g 12.5 mL/hr over 240 Minutes Intravenous Every 8 hours 01/16/18 0136 01/17/18 0918   01/15/18 2130  piperacillin-tazobactam (ZOSYN) IVPB 3.375 g     3.375 g 100 mL/hr over 30 Minutes Intravenous  Once 01/15/18 2123 01/15/18 2211   01/15/18 2130  vancomycin (VANCOCIN) IVPB 1000 mg/200 mL premix     1,000 mg 200 mL/hr over 60 Minutes Intravenous  Once 01/15/18 2123 01/15/18 2313   01/15/18 2130  oseltamivir (TAMIFLU) capsule 75 mg     75 mg Oral  Once 01/15/18 2123 01/15/18 2134       Objective: Vitals:   01/17/18 0528 01/17/18 1443 01/17/18 2039 01/18/18 0336  BP: 117/79 107/63 111/66 121/82  Pulse: 77 97 77 75  Resp: 16 16 18 17   Temp: 99.6 F (37.6 C) 100.1 F (37.8 C) 99.4 F (37.4 C) 99.1 F (37.3 C)  TempSrc:  Oral    SpO2: 97% 99% 98% 96%  Weight:      Height:        Intake/Output Summary (Last 24 hours) at  01/18/2018 0721 Last data filed at 01/17/2018 2336 Gross per 24 hour  Intake 718.75 ml  Output -  Net 718.75 ml   Filed Weights   01/15/18 1923  Weight: 80.7 kg    Examination: General exam: Appears comfortable  HEENT: PERRLA, oral mucosa moist, no sclera icterus or thrush Respiratory system: Clear to auscultation. Respiratory effort normal. Cardiovascular system: S1 & S2 heard, RRR. 2/6 murmur at LUS border  Gastrointestinal system: Abdomen soft, non-tender, nondistended. Normal bowel sounds. Central nervous system: Alert and oriented. No focal neurological deficits. Extremities: No cyanosis, clubbing or edema Skin: No rashes or ulcers Psychiatry:  Mood & affect appropriate.      Data Reviewed: I have personally reviewed following labs and imaging studies  CBC: Recent Labs  Lab 01/15/18 2019 01/16/18 0352 01/16/18 0735 01/17/18 0644 01/18/18 0553  WBC 16.8* 16.0* 16.8* 15.5* 14.8*  NEUTROABS 13.2* 12.1*  --  11.5*  --   HGB 10.4* 9.1* 8.9* 9.0* 8.8*  HCT 33.7* 29.8* 28.9* 29.0* 29.2*  MCV 86.4 87.4 87.6 87.3 87.2  PLT 210 177 176 196 017   Basic Metabolic Panel: Recent Labs  Lab 01/15/18 2019 01/16/18 0352 01/17/18 0644 01/18/18 0553  NA 137 139  --   --   K 4.1 4.3  --   --   CL 105 108  --   --   CO2 24 24  --   --   GLUCOSE 103* 114*  --   --   BUN 19 16  --   --   CREATININE 1.03 0.83 0.87 0.81  CALCIUM 9.5 9.0  --   --    GFR: Estimated Creatinine Clearance: 103.3 mL/min (by C-G formula based on SCr of 0.81 mg/dL). Liver Function Tests: Recent Labs  Lab 01/15/18 2019 01/16/18 0352  AST 28 21  ALT 18 17  ALKPHOS 70 54  BILITOT 0.9 1.0  PROT 6.6 5.7*  ALBUMIN 3.2* 2.7*   No results for input(s): LIPASE, AMYLASE in the last 168 hours. No results for input(s): AMMONIA in the last 168 hours. Coagulation Profile: No results for input(s): INR, PROTIME in the last 168 hours. Cardiac Enzymes: No results for input(s): CKTOTAL, CKMB, CKMBINDEX, TROPONINI in the last 168 hours. BNP (last 3 results) No results for input(s): PROBNP in the last 8760 hours. HbA1C: No results for input(s): HGBA1C in the last 72 hours. CBG: No results for input(s): GLUCAP in the last 168 hours. Lipid Profile: No results for input(s): CHOL, HDL, LDLCALC, TRIG, CHOLHDL, LDLDIRECT in the last 72 hours. Thyroid Function Tests: No results for input(s): TSH, T4TOTAL, FREET4, T3FREE, THYROIDAB in the last 72 hours. Anemia Panel: Recent Labs    01/16/18 0735  VITAMINB12 631  FOLATE 15.3  FERRITIN 538*  TIBC 194*  IRON 13*  RETICCTPCT 1.4   Urine analysis:    Component Value Date/Time   COLORURINE YELLOW 01/15/2018 2214   APPEARANCEUR CLEAR  01/15/2018 2214   LABSPEC 1.010 01/15/2018 2214   PHURINE 7.0 01/15/2018 2214   GLUCOSEU NEGATIVE 01/15/2018 2214   HGBUR NEGATIVE 01/15/2018 2214   BILIRUBINUR NEGATIVE 01/15/2018 2214   KETONESUR NEGATIVE 01/15/2018 2214   PROTEINUR NEGATIVE 01/15/2018 2214   UROBILINOGEN 0.2 12/03/2013 1344   NITRITE NEGATIVE 01/15/2018 2214   LEUKOCYTESUR NEGATIVE 01/15/2018 2214   Sepsis Labs: @LABRCNTIP (procalcitonin:4,lacticidven:4) ) Recent Results (from the past 240 hour(s))  Culture, blood (Routine X 2) w Reflex to ID Panel     Status: Abnormal (Preliminary result)   Collection  Time: 01/15/18  8:15 PM  Result Value Ref Range Status   Specimen Description   Final    BLOOD RIGHT HAND Performed at [facility], [address] [place], [place] [address]    Special Requests   Final    BOTTLES DRAWN AEROBIC AND ANAEROBIC Blood Culture adequate volume Performed at [facility], [address] [place], [place] [address]    Culture  Setup Time   Final    IN BOTH AEROBIC AND ANAEROBIC BOTTLES GRAM POSITIVE COCCI CRITICAL VALUE NOTED.  VALUE IS CONSISTENT WITH PREVIOUSLY REPORTED AND CALLED VALUE. Performed at [facility], [place] [address] [place], [place] [address]    Culture STAPHYLOCOCCUS EPIDERMIDIS (A)  Final   Report Status PENDING  Incomplete  Culture, blood (Routine X 2) w Reflex to ID Panel     Status: Abnormal (Preliminary result)   Collection Time: 01/15/18  8:15 PM  Result Value Ref Range Status   Specimen Description   Final    BLOOD RIGHT ARM Performed at [facility], [address] [place], [place] [address]    Special Requests   Final    BOTTLES DRAWN AEROBIC AND ANAEROBIC Blood Culture adequate volume Performed at [facility], [address] [place], [place] [address]    Culture  Setup Time   Final    GRAM POSITIVE COCCI IN BOTH AEROBIC AND ANAEROBIC BOTTLES CRITICAL RESULT CALLED TO, READ BACK BY AND VERIFIED WITH:  Sheffield Slider [facility] 01/16/18 1937 JDW    Culture STAPHYLOCOCCUS EPIDERMIDIS (A)  Final   Report Status PENDING  Incomplete  Blood Culture ID Panel (Reflexed)     Status: Abnormal   Collection Time: 01/15/18  8:15 PM  Result Value Ref Range Status   Enterococcus species NOT DETECTED NOT DETECTED Final   Listeria monocytogenes NOT DETECTED NOT DETECTED Final   Staphylococcus species DETECTED (A) NOT DETECTED Final    Comment: Methicillin (oxacillin) susceptible coagulase negative staphylococcus. Possible blood culture contaminant (unless isolated from more than one blood culture draw or clinical case suggests pathogenicity). No antibiotic treatment is indicated for blood  culture contaminants. CRITICAL RESULT CALLED TO, READ BACK BY AND VERIFIED WITH: Henry Russel [facility] 01/16/18 1937 JDW    Staphylococcus aureus (BCID) NOT DETECTED NOT DETECTED Final   Methicillin resistance NOT DETECTED NOT DETECTED Final   Streptococcus species NOT DETECTED NOT DETECTED Final   Streptococcus agalactiae NOT DETECTED NOT DETECTED Final   Streptococcus pneumoniae NOT DETECTED NOT DETECTED Final   Streptococcus pyogenes NOT DETECTED NOT DETECTED Final   Acinetobacter baumannii NOT DETECTED NOT DETECTED Final   Enterobacteriaceae species NOT DETECTED NOT DETECTED Final   Enterobacter cloacae complex NOT DETECTED NOT DETECTED Final   Escherichia coli NOT DETECTED NOT DETECTED Final   Klebsiella oxytoca NOT DETECTED NOT DETECTED Final   Klebsiella pneumoniae NOT DETECTED NOT DETECTED Final   Proteus species NOT DETECTED NOT DETECTED Final   Serratia marcescens NOT DETECTED NOT DETECTED Final   Haemophilus influenzae NOT DETECTED NOT DETECTED Final   Neisseria meningitidis NOT DETECTED NOT DETECTED Final   Pseudomonas aeruginosa NOT DETECTED NOT DETECTED Final   Candida albicans NOT DETECTED NOT DETECTED Final   Candida glabrata NOT DETECTED NOT DETECTED Final   Candida krusei NOT DETECTED NOT DETECTED Final     Candida parapsilosis NOT DETECTED NOT DETECTED Final   Candida tropicalis NOT DETECTED NOT DETECTED Final    Comment: Performed  at [facility], [place] [address], [place], [place] [address]  Group A Strep by PCR     Status: None   Collection Time: 01/15/18  8:19 PM  Result Value Ref Range Status   Group A Strep by PCR NOT DETECTED NOT DETECTED Final    Comment: Performed at [facility], [address] [place], [place] [address]  Respiratory Panel by PCR     Status: None   Collection Time: 01/16/18  7:52 AM  Result Value Ref Range Status   Adenovirus NOT DETECTED NOT DETECTED Final   Coronavirus 229E NOT DETECTED NOT DETECTED Final   Coronavirus HKU1 NOT DETECTED NOT DETECTED Final   Coronavirus NL63 NOT DETECTED NOT DETECTED Final   Coronavirus OC43 NOT DETECTED NOT DETECTED Final   Metapneumovirus NOT DETECTED NOT DETECTED Final   Rhinovirus / Enterovirus NOT DETECTED NOT DETECTED Final   Influenza A NOT DETECTED NOT DETECTED Final   Influenza B NOT DETECTED NOT DETECTED Final   Parainfluenza Virus 1 NOT DETECTED NOT DETECTED Final   Parainfluenza Virus 2 NOT DETECTED NOT DETECTED Final   Parainfluenza Virus 3 NOT DETECTED NOT DETECTED Final   Parainfluenza Virus 4 NOT DETECTED NOT DETECTED Final   Respiratory Syncytial Virus NOT DETECTED NOT DETECTED Final   Bordetella pertussis NOT DETECTED NOT DETECTED Final   Chlamydophila pneumoniae NOT DETECTED NOT DETECTED Final   Mycoplasma pneumoniae NOT DETECTED NOT DETECTED Final  MRSA PCR Screening     Status: None   Collection Time: 01/16/18 11:08 AM  Result Value Ref Range Status   MRSA by PCR NEGATIVE NEGATIVE Final    Comment:        The GeneXpert MRSA Assay (FDA approved for NASAL specimens only), is one component of a comprehensive MRSA colonization surveillance program. It is not intended to diagnose MRSA infection nor to guide or monitor treatment for MRSA infections. Performed at [facility], [place] [address], [place], [place] [address]   Culture, blood (Routine X 2) w Reflex to ID Panel     Status: None (Preliminary result)   Collection Time: 01/17/18 12:57 PM  Result Value Ref Range Status   Specimen Description   Final    BLOOD LEFT ARM Performed at [place] [address], [place], [place] [address]    Special Requests   Final    BOTTLES DRAWN AEROBIC AND ANAEROBIC Blood Culture adequate volume   Culture PENDING  Incomplete   Report Status PENDING  Incomplete         Radiology Studies: No results found.    Scheduled Meds: . enoxaparin (LOVENOX) injection  40 mg Subcutaneous QHS   Continuous Infusions: . sodium chloride 250 mL (01/18/18 0628)  .  ceFAZolin (ANCEF) IV 2 g (01/18/18 0629)     LOS: 3 days    Time spent in minutes: 35    Debbe Odea, MD [facility] Pager: [email or web address] Password [email or web address] 01/18/2018, 7:21 AM

## 2018-01-18 NOTE — Progress Notes (Signed)
INFECTIOUS DISEASE PROGRESS NOTE  ID: Patrick Walter is a 62 y.o. male with  Principal Problem:   Sepsis due to methicillin susceptible Staphylococcus aureus ([facility]) Active Problems:   Essential hypertension, benign   S/P aortic valve replacement with bioprosthetic valve   Patent foramen ovale   Ascending aorta enlargement ([facility])   Primary osteoarthritis of left hip   Hypercalcemia   Normocytic anemia   Bacteremia due to Staphylococcus aureus  Subjective: No complaints, feels 50% better, less body aches.   Abtx:  Anti-infectives (From admission, onward)   Start     Dose/Rate Route Frequency Ordered Stop   01/17/18 1400  ceFAZolin (ANCEF) IVPB 2g/100 mL premix     2 g 200 mL/hr over 30 Minutes Intravenous Every 8 hours 01/17/18 1156     01/17/18 1030  cefTRIAXone (ROCEPHIN) 2 g in sodium chloride 0.9 % 100 mL IVPB  Status:  Discontinued     2 g 200 mL/hr over 30 Minutes Intravenous Every 24 hours 01/17/18 0919 01/17/18 1156   01/16/18 0800  vancomycin (VANCOCIN) 1,750 mg in sodium chloride 0.9 % 500 mL IVPB  Status:  Discontinued     1,750 mg 250 mL/hr over 120 Minutes Intravenous Every 24 hours 01/16/18 0136 01/17/18 0919   01/16/18 0600  piperacillin-tazobactam (ZOSYN) IVPB 3.375 g  Status:  Discontinued     3.375 g 12.5 mL/hr over 240 Minutes Intravenous Every 8 hours 01/16/18 0136 01/17/18 0918   01/15/18 2130  piperacillin-tazobactam (ZOSYN) IVPB 3.375 g     3.375 g 100 mL/hr over 30 Minutes Intravenous  Once 01/15/18 2123 01/15/18 2211   01/15/18 2130  vancomycin (VANCOCIN) IVPB 1000 mg/200 mL premix     1,000 mg 200 mL/hr over 60 Minutes Intravenous  Once 01/15/18 2123 01/15/18 2313   01/15/18 2130  oseltamivir (TAMIFLU) capsule 75 mg     75 mg Oral  Once 01/15/18 2123 01/15/18 2134      Medications:  Scheduled: . enoxaparin (LOVENOX) injection  40 mg Subcutaneous QHS    Objective: Vital signs in last 24 hours: Temp:  [98.5 F (36.9 C)-99.4 F (37.4 C)]  98.5 F (36.9 C) (10/30 1415) Pulse Rate:  [70-77] 70 (10/30 1415) Resp:  [17-20] 20 (10/30 1415) BP: (111-121)/(66-82) 117/66 (10/30 1415) SpO2:  [96 %-100 %] 100 % (10/30 1415)   General appearance: alert, cooperative and no distress Resp: clear to auscultation bilaterally Cardio: regular rate and rhythm GI: normal findings: bowel sounds normal and soft, non-tender  Lab Results Recent Labs    01/15/18 2019 01/16/18 0352  01/17/18 0644 01/18/18 0553  WBC 16.8* 16.0*   < > 15.5* 14.8*  HGB 10.4* 9.1*   < > 9.0* 8.8*  HCT 33.7* 29.8*   < > 29.0* 29.2*  NA 137 139  --   --   --   K 4.1 4.3  --   --   --   CL 105 108  --   --   --   CO2 24 24  --   --   --   BUN 19 16  --   --   --   CREATININE 1.03 0.83  --  0.87 0.81   < > = values in this interval not displayed.   Liver Panel Recent Labs    01/15/18 2019 01/16/18 0352  PROT 6.6 5.7*  ALBUMIN 3.2* 2.7*  AST 28 21  ALT 18 17  ALKPHOS 70 54  BILITOT 0.9 1.0  BILIDIR  --  0.2  IBILI  --  0.8   Sedimentation Rate No results for input(s): ESRSEDRATE in the last 72 hours. C-Reactive Protein No results for input(s): CRP in the last 72 hours.  Microbiology: Recent Results (from the past 240 hour(s))  Culture, blood (Routine X 2) w Reflex to ID Panel     Status: Abnormal (Preliminary result)   Collection Time: 01/15/18  8:15 PM  Result Value Ref Range Status   Specimen Description   Final    BLOOD RIGHT HAND Performed at [facility], [address] [place], [place] [address]    Special Requests   Final    BOTTLES DRAWN AEROBIC AND ANAEROBIC Blood Culture adequate volume Performed at [facility], [address] [place], [place] [address]    Culture  Setup Time   Final    IN BOTH AEROBIC AND ANAEROBIC BOTTLES GRAM POSITIVE COCCI CRITICAL VALUE NOTED.  VALUE IS CONSISTENT WITH PREVIOUSLY REPORTED AND CALLED VALUE. Performed at [facility], [place] [address], [place], [place]  [address]    Culture STAPHYLOCOCCUS EPIDERMIDIS (A)  Final   Report Status PENDING  Incomplete  Culture, blood (Routine X 2) w Reflex to ID Panel     Status: Abnormal   Collection Time: 01/15/18  8:15 PM  Result Value Ref Range Status   Specimen Description   Final    BLOOD RIGHT ARM Performed at [facility], [address] [place], [place] [address]    Special Requests   Final    BOTTLES DRAWN AEROBIC AND ANAEROBIC Blood Culture adequate volume Performed at [facility], [address] [place], [place] [address]    Culture  Setup Time   Final    GRAM POSITIVE COCCI IN BOTH AEROBIC AND ANAEROBIC BOTTLES CRITICAL RESULT CALLED TO, READ BACK BY AND VERIFIED WITH: Sheffield Slider [facility] 01/16/18 1937 JDW    Culture (A)  Final    STAPHYLOCOCCUS EPIDERMIDIS SUSCEPTIBILITIES PERFORMED ON PREVIOUS CULTURE WITHIN THE LAST 5 DAYS. Performed at [facility], [place] [address], [place], [place] [address]    Report Status 01/18/2018 FINAL  Final   Organism ID, Bacteria STAPHYLOCOCCUS EPIDERMIDIS  Final      Susceptibility   Staphylococcus epidermidis - MIC*    CIPROFLOXACIN <=0.5 SENSITIVE Sensitive     ERYTHROMYCIN <=0.25 SENSITIVE Sensitive     GENTAMICIN <=0.5 SENSITIVE Sensitive     OXACILLIN <=0.25 SENSITIVE Sensitive     TETRACYCLINE <=1 SENSITIVE Sensitive     VANCOMYCIN 1 SENSITIVE Sensitive     TRIMETH/SULFA <=10 SENSITIVE Sensitive     CLINDAMYCIN <=0.25 SENSITIVE Sensitive     RIFAMPIN <=0.5 SENSITIVE Sensitive     Inducible Clindamycin NEGATIVE Sensitive     * STAPHYLOCOCCUS EPIDERMIDIS  Blood Culture ID Panel (Reflexed)     Status: Abnormal   Collection Time: 01/15/18  8:15 PM  Result Value Ref Range Status   Enterococcus species NOT DETECTED NOT DETECTED Final   Listeria monocytogenes NOT DETECTED NOT DETECTED Final   Staphylococcus species DETECTED (A) NOT DETECTED Final    Comment: Methicillin (oxacillin) susceptible coagulase negative  staphylococcus. Possible blood culture contaminant (unless isolated from more than one blood culture draw or clinical case suggests pathogenicity). No antibiotic treatment is indicated for blood  culture contaminants. CRITICAL RESULT CALLED TO, READ BACK BY AND VERIFIED WITH: Henry Russel [facility] 01/16/18 1937 JDW    Staphylococcus aureus (BCID) NOT DETECTED NOT DETECTED  Final   Methicillin resistance NOT DETECTED NOT DETECTED Final   Streptococcus species NOT DETECTED NOT DETECTED Final   Streptococcus agalactiae NOT DETECTED NOT DETECTED Final   Streptococcus pneumoniae NOT DETECTED NOT DETECTED Final   Streptococcus pyogenes NOT DETECTED NOT DETECTED Final   Acinetobacter baumannii NOT DETECTED NOT DETECTED Final   Enterobacteriaceae species NOT DETECTED NOT DETECTED Final   Enterobacter cloacae complex NOT DETECTED NOT DETECTED Final   Escherichia coli NOT DETECTED NOT DETECTED Final   Klebsiella oxytoca NOT DETECTED NOT DETECTED Final   Klebsiella pneumoniae NOT DETECTED NOT DETECTED Final   Proteus species NOT DETECTED NOT DETECTED Final   Serratia marcescens NOT DETECTED NOT DETECTED Final   Haemophilus influenzae NOT DETECTED NOT DETECTED Final   Neisseria meningitidis NOT DETECTED NOT DETECTED Final   Pseudomonas aeruginosa NOT DETECTED NOT DETECTED Final   Candida albicans NOT DETECTED NOT DETECTED Final   Candida glabrata NOT DETECTED NOT DETECTED Final   Candida krusei NOT DETECTED NOT DETECTED Final   Candida parapsilosis NOT DETECTED NOT DETECTED Final   Candida tropicalis NOT DETECTED NOT DETECTED Final    Comment: Performed at [facility], [place] [address] [place], [place] [address]  Group A Strep by PCR     Status: None   Collection Time: 01/15/18  8:19 PM  Result Value Ref Range Status   Group A Strep by PCR NOT DETECTED NOT DETECTED Final    Comment: Performed at [facility], [address] [place], [place] [address]  Respiratory Panel by PCR      Status: None   Collection Time: 01/16/18  7:52 AM  Result Value Ref Range Status   Adenovirus NOT DETECTED NOT DETECTED Final   Coronavirus 229E NOT DETECTED NOT DETECTED Final   Coronavirus HKU1 NOT DETECTED NOT DETECTED Final   Coronavirus NL63 NOT DETECTED NOT DETECTED Final   Coronavirus OC43 NOT DETECTED NOT DETECTED Final   Metapneumovirus NOT DETECTED NOT DETECTED Final   Rhinovirus / Enterovirus NOT DETECTED NOT DETECTED Final   Influenza A NOT DETECTED NOT DETECTED Final   Influenza B NOT DETECTED NOT DETECTED Final   Parainfluenza Virus 1 NOT DETECTED NOT DETECTED Final   Parainfluenza Virus 2 NOT DETECTED NOT DETECTED Final   Parainfluenza Virus 3 NOT DETECTED NOT DETECTED Final   Parainfluenza Virus 4 NOT DETECTED NOT DETECTED Final   Respiratory Syncytial Virus NOT DETECTED NOT DETECTED Final   Bordetella pertussis NOT DETECTED NOT DETECTED Final   Chlamydophila pneumoniae NOT DETECTED NOT DETECTED Final   Mycoplasma pneumoniae NOT DETECTED NOT DETECTED Final  MRSA PCR Screening     Status: None   Collection Time: 01/16/18 11:08 AM  Result Value Ref Range Status   MRSA by PCR NEGATIVE NEGATIVE Final    Comment:        The GeneXpert MRSA Assay (FDA approved for NASAL specimens only), is one component of a comprehensive MRSA colonization surveillance program. It is not intended to diagnose MRSA infection nor to guide or monitor treatment for MRSA infections. Performed at [facility], [place] [address] [place], [place] [address]   Culture, blood (Routine X 2) w Reflex to ID Panel     Status: None (Preliminary result)   Collection Time: 01/17/18 12:57 PM  Result Value Ref Range Status   Specimen Description   Final    BLOOD LEFT ARM Performed at [place] [address], [place], [place] [address]  Special Requests   Final    BOTTLES DRAWN AEROBIC AND ANAEROBIC Blood Culture adequate volume   Culture   Final    NO  GROWTH < 24 HOURS Performed at [facility], [place] [address], [place], [place] [address]    Report Status PENDING  Incomplete    Studies/Results: No results found.   Assessment/Plan: MSSE bacteremia Bioprosthetic Ao valve  Total days of antibiotics: 3 ancef  Has been afebrile (Tmax 100.4), WBC slightly improved (14.8 <-- 16.8) Tee tomorrow Repeat BCx 10-29 are pending.  Await TEE to make decisions about his need for long term abnx         Bobby Rumpf MD, FACP Infectious Diseases (pager) [phone] [email or web address] 01/18/2018, 4:30 PM  LOS: 3 days

## 2018-01-18 NOTE — Progress Notes (Signed)
[facility] has been called and set up for transportation to [facility] tomorrow morning at 0900.

## 2018-01-19 ENCOUNTER — Encounter (HOSPITAL_COMMUNITY)
Admission: EM | Disposition: A | Discharge status: Home Health | Payer: Self-pay | Source: Home / Self Care | Attending: Thoracic Surgery (Cardiothoracic Vascular Surgery)

## 2018-01-19 ENCOUNTER — Other Ambulatory Visit (HOSPITAL_COMMUNITY): Payer: 59

## 2018-01-19 ENCOUNTER — Inpatient Hospital Stay (HOSPITAL_COMMUNITY): Payer: 59

## 2018-01-19 ENCOUNTER — Encounter (HOSPITAL_COMMUNITY): Payer: Self-pay | Admitting: *Deleted

## 2018-01-19 DIAGNOSIS — T826XXD Infection and inflammatory reaction due to cardiac valve prosthesis, subsequent encounter: Secondary | ICD-10-CM

## 2018-01-19 DIAGNOSIS — T826XXA Infection and inflammatory reaction due to cardiac valve prosthesis, initial encounter: Principal | ICD-10-CM

## 2018-01-19 DIAGNOSIS — R7881 Bacteremia: Secondary | ICD-10-CM

## 2018-01-19 DIAGNOSIS — I340 Nonrheumatic mitral (valve) insufficiency: Secondary | ICD-10-CM

## 2018-01-19 DIAGNOSIS — I7789 Other specified disorders of arteries and arterioles: Secondary | ICD-10-CM

## 2018-01-19 DIAGNOSIS — B957 Other staphylococcus as the cause of diseases classified elsewhere: Secondary | ICD-10-CM

## 2018-01-19 DIAGNOSIS — I330 Acute and subacute infective endocarditis: Secondary | ICD-10-CM

## 2018-01-19 DIAGNOSIS — I351 Nonrheumatic aortic (valve) insufficiency: Secondary | ICD-10-CM

## 2018-01-19 DIAGNOSIS — D649 Anemia, unspecified: Secondary | ICD-10-CM

## 2018-01-19 DIAGNOSIS — I38 Endocarditis, valve unspecified: Secondary | ICD-10-CM | POA: Diagnosis present

## 2018-01-19 HISTORY — PX: TEE WITHOUT CARDIOVERSION: SHX5443

## 2018-01-19 LAB — CULTURE, BLOOD (ROUTINE X 2)
SPECIAL REQUESTS: ADEQUATE
Special Requests: ADEQUATE

## 2018-01-19 LAB — BASIC METABOLIC PANEL
ANION GAP: 8 (ref 5–15)
BUN: 12 mg/dL (ref 6–20)
CHLORIDE: 104 mmol/L (ref 98–111)
CO2: 26 mmol/L (ref 22–32)
CREATININE: 0.82 mg/dL (ref 0.61–1.24)
Calcium: 10.4 mg/dL — ABNORMAL HIGH (ref 8.9–10.3)
GFR calc Af Amer: >60 mL/min (ref >60)
GFR calc non Af Amer: >60 mL/min (ref >60)
GLUCOSE: 106 mg/dL — AB (ref 70–99)
Potassium: 4.3 mmol/L (ref 3.5–5.1)
Sodium: 138 mmol/L (ref 135–145)

## 2018-01-19 SURGERY — ECHOCARDIOGRAM, TRANSESOPHAGEAL
Anesthesia: Moderate Sedation

## 2018-01-19 MED ORDER — ACETAMINOPHEN 325 MG PO TABS
ORAL_TABLET | ORAL | Status: AC
  Administered 2018-01-19: 650 mg via ORAL
  Filled 2018-01-19: qty 2

## 2018-01-19 MED ORDER — BUTAMBEN-TETRACAINE-BENZOCAINE 2-2-14 % EX AERO
INHALATION_SPRAY | CUTANEOUS | Status: DC | PRN
  Administered 2018-01-19: 2 via TOPICAL

## 2018-01-19 MED ORDER — MIDAZOLAM HCL 5 MG/ML IJ SOLN
INTRAMUSCULAR | Status: AC
  Filled 2018-01-19: qty 2

## 2018-01-19 MED ORDER — MIDAZOLAM HCL 10 MG/2ML IJ SOLN
INTRAMUSCULAR | Status: DC | PRN
  Administered 2018-01-19: 2 mg via INTRAVENOUS
  Administered 2018-01-19: 1 mg via INTRAVENOUS
  Administered 2018-01-19: 2 mg via INTRAVENOUS

## 2018-01-19 MED ORDER — ONDANSETRON HCL 4 MG/2ML IJ SOLN
INTRAMUSCULAR | Status: AC
  Filled 2018-01-19: qty 2

## 2018-01-19 MED ORDER — FENTANYL CITRATE (PF) 100 MCG/2ML IJ SOLN
INTRAMUSCULAR | Status: AC
  Filled 2018-01-19: qty 2

## 2018-01-19 MED ORDER — DIPHENHYDRAMINE HCL 50 MG/ML IJ SOLN
INTRAMUSCULAR | Status: AC
  Filled 2018-01-19: qty 1

## 2018-01-19 MED ORDER — FENTANYL CITRATE (PF) 100 MCG/2ML IJ SOLN
INTRAMUSCULAR | Status: DC | PRN
  Administered 2018-01-19: 50 ug via INTRAVENOUS

## 2018-01-19 MED ORDER — SODIUM CHLORIDE 0.9 % IV SOLN
INTRAVENOUS | Status: DC
  Administered 2018-01-19: 09:00:00 via INTRAVENOUS

## 2018-01-19 NOTE — Op Note (Signed)
INDICATIONS: infective endocarditis  PROCEDURE:   Informed consent was obtained prior to the procedure. The risks, benefits and alternatives for the procedure were discussed and the patient comprehended these risks.  Risks include, but are not limited to, cough, sore throat, vomiting, nausea, somnolence, esophageal and stomach trauma or perforation, bleeding, low blood pressure, aspiration, pneumonia, infection, trauma to the teeth and death.    After a procedural time-out, the oropharynx was anesthetized with 20% benzocaine spray.   During this procedure the patient was administered a total of Versed 5 mg and Fentanyl 50 mcg to achieve and maintain moderate conscious sedation.  The patient's heart rate, blood pressure, and oxygen saturationweare monitored continuously during the procedure. The period of conscious sedation was 10 minutes, of which I was present face-to-face 100% of this time.  The transesophageal probe was inserted in the esophagus and stomach without difficulty and multiple views were obtained.  The patient was kept under observation until the patient left the procedure room.  The patient left the procedure room in stable condition.   Agitated microbubble saline contrast was not administered.  COMPLICATIONS:    There were no immediate complications.  FINDINGS:  Large vegetation attached to one of the prosthetic aortic valve leaflets. Mild AI. LVH. EF 60%. Mild MR otherwise normal TEE.  RECOMMENDATIONS:     Continue antibiotics. Strongly consider redo AVR.  Time Spent Directly with the Patient:  30 minutes   Huxley Shurley 01/19/2018, 9:57 AM

## 2018-01-19 NOTE — Progress Notes (Signed)
[facility]   Reason for visit: Follow up on bacteremia with Staph epidermidis and AV endocarditis  Interval History: TEE positive for large vegetation on prosthetic aortic leaflet; no sob, no fever or chills.  No associated diarrhea.   Physical Exam: Constitutional:  Vitals:   01/19/18 1315 01/19/18 1407  BP: 125/74 106/72  Pulse: 71 83  Resp: 11 15  Temp: 99.1 F (37.3 C) 98.3 F (36.8 C)  SpO2: 98% 97%   patient appears in NAD Respiratory: Normal respiratory effort; CTA B Cardiovascular: RRR GI: soft, nt, nd  Review of Systems: Constitutional: negative for fevers, chills and malaise Gastrointestinal: negative for nausea and diarrhea Integument/breast: negative for rash  Lab Results  Component Value Date   WBC 14.8 (H) 01/18/2018   HGB 8.8 (L) 01/18/2018   HCT 29.2 (L) 01/18/2018   MCV 87.2 01/18/2018   PLT 251 01/18/2018    Lab Results  Component Value Date   CREATININE 0.82 01/19/2018   BUN 12 01/19/2018   NA 138 01/19/2018   K 4.3 01/19/2018   CL 104 01/19/2018   CO2 26 01/19/2018    Lab Results  Component Value Date   ALT 17 01/16/2018   AST 21 01/16/2018   ALKPHOS 54 01/16/2018     Microbiology: Recent Results (from the past 240 hour(s))  Culture, blood (Routine X 2) w Reflex to ID Panel     Status: Abnormal (Preliminary result)   Collection Time: 01/15/18  8:15 PM  Result Value Ref Range Status   Specimen Description   Final    BLOOD RIGHT HAND Performed at [facility], [address] [place], [place] [address]    Special Requests   Final    BOTTLES DRAWN AEROBIC AND ANAEROBIC Blood Culture adequate volume Performed at [facility], [address] [place], [place] [address]    Culture  Setup Time   Final    IN BOTH AEROBIC AND ANAEROBIC BOTTLES GRAM POSITIVE COCCI CRITICAL VALUE NOTED.  VALUE IS CONSISTENT WITH PREVIOUSLY REPORTED AND CALLED VALUE. Performed at [facility],  [place] [address], [place], [place] [address]    Culture STAPHYLOCOCCUS EPIDERMIDIS (A)  Final   Report Status PENDING  Incomplete  Culture, blood (Routine X 2) w Reflex to ID Panel     Status: Abnormal   Collection Time: 01/15/18  8:15 PM  Result Value Ref Range Status   Specimen Description   Final    BLOOD RIGHT ARM Performed at [facility], [address] [place], [place] [address]    Special Requests   Final    BOTTLES DRAWN AEROBIC AND ANAEROBIC Blood Culture adequate volume Performed at [facility], [address] [place], [place] [address]    Culture  Setup Time   Final    GRAM POSITIVE COCCI IN BOTH AEROBIC AND ANAEROBIC BOTTLES CRITICAL RESULT CALLED TO, READ BACK BY AND VERIFIED WITH: [facility] Sl[facility] 01/16/18 1937 JDW    Culture (A)  Final    STAPHYLOCOCCUS EPIDERMIDIS SUSCEPTIBILITIES PERFORMED ON PREVIOUS CULTURE WITHIN THE LAST 5 DAYS. Performed at [facility], [place] [address] [place], [place] [address]    Report Status 01/18/2018 FINAL  Final   Organism ID, Bacteria STAPHYLOCOCCUS EPIDERMIDIS  Final      Susceptibility   Staphylococcus epidermidis - MIC*    CIPROFLOXACIN <=0.5 SENSITIVE Sensitive     ERYTHROMYCIN <=0.25 SENSITIVE  Sensitive     GENTAMICIN <=0.5 SENSITIVE Sensitive     OXACILLIN <=0.25 SENSITIVE Sensitive     TETRACYCLINE <=1 SENSITIVE Sensitive     VANCOMYCIN 1 SENSITIVE Sensitive     TRIMETH/SULFA <=10 SENSITIVE Sensitive     CLINDAMYCIN <=0.25 SENSITIVE Sensitive     RIFAMPIN <=0.5 SENSITIVE Sensitive     Inducible Clindamycin NEGATIVE Sensitive     * STAPHYLOCOCCUS EPIDERMIDIS  Blood Culture ID Panel (Reflexed)     Status: Abnormal   Collection Time: 01/15/18  8:15 PM  Result Value Ref Range Status   Enterococcus species NOT DETECTED NOT DETECTED Final   Listeria monocytogenes NOT DETECTED NOT DETECTED Final   Staphylococcus species DETECTED (A) NOT DETECTED Final    Comment: Methicillin (oxacillin) susceptible  coagulase negative staphylococcus. Possible blood culture contaminant (unless isolated from more than one blood culture draw or clinical case suggests pathogenicity). No antibiotic treatment is indicated for blood  culture contaminants. CRITICAL RESULT CALLED TO, READ BACK BY AND VERIFIED WITH: [facility] 01/16/18 1937 JDW    Staphylococcus aureus (BCID) NOT DETECTED NOT DETECTED Final   Methicillin resistance NOT DETECTED NOT DETECTED Final   Streptococcus species NOT DETECTED NOT DETECTED Final   Streptococcus agalactiae NOT DETECTED NOT DETECTED Final   Streptococcus pneumoniae NOT DETECTED NOT DETECTED Final   Streptococcus pyogenes NOT DETECTED NOT DETECTED Final   Acinetobacter baumannii NOT DETECTED NOT DETECTED Final   Enterobacteriaceae species NOT DETECTED NOT DETECTED Final   Enterobacter cloacae complex NOT DETECTED NOT DETECTED Final   Escherichia coli NOT DETECTED NOT DETECTED Final   Klebsiella oxytoca NOT DETECTED NOT DETECTED Final   Klebsiella pneumoniae NOT DETECTED NOT DETECTED Final   Proteus species NOT DETECTED NOT DETECTED Final   Serratia marcescens NOT DETECTED NOT DETECTED Final   Haemophilus influenzae NOT DETECTED NOT DETECTED Final   Neisseria meningitidis NOT DETECTED NOT DETECTED Final   Pseudomonas aeruginosa NOT DETECTED NOT DETECTED Final   Candida albicans NOT DETECTED NOT DETECTED Final   Candida glabrata NOT DETECTED NOT DETECTED Final   Candida krusei NOT DETECTED NOT DETECTED Final   Candida parapsilosis NOT DETECTED NOT DETECTED Final   Candida tropicalis NOT DETECTED NOT DETECTED Final    Comment: Performed at [facility], [place] [place], [place] [address]  Group A Strep by PCR     Status: None   Collection Time: 01/15/18  8:19 PM  Result Value Ref Range Status   Group A Strep by PCR NOT DETECTED NOT DETECTED Final    Comment: Performed at [facility], [address] [place], [place] [address]  Respiratory  Panel by PCR     Status: None   Collection Time: 01/16/18  7:52 AM  Result Value Ref Range Status   Adenovirus NOT DETECTED NOT DETECTED Final   Coronavirus 229E NOT DETECTED NOT DETECTED Final   Coronavirus HKU1 NOT DETECTED NOT DETECTED Final   Coronavirus NL63 NOT DETECTED NOT DETECTED Final   Coronavirus OC43 NOT DETECTED NOT DETECTED Final   Metapneumovirus NOT DETECTED NOT DETECTED Final   Rhinovirus / Enterovirus NOT DETECTED NOT DETECTED Final   Influenza A NOT DETECTED NOT DETECTED Final   Influenza B NOT DETECTED NOT DETECTED Final   Parainfluenza Virus 1 NOT DETECTED NOT DETECTED Final   Parainfluenza Virus 2 NOT DETECTED NOT DETECTED Final   Parainfluenza Virus 3 NOT DETECTED NOT DETECTED Final   Parainfluenza Virus 4 NOT DETECTED NOT DETECTED Final  Respiratory Syncytial Virus NOT DETECTED NOT DETECTED Final   Bordetella pertussis NOT DETECTED NOT DETECTED Final   Chlamydophila pneumoniae NOT DETECTED NOT DETECTED Final   Mycoplasma pneumoniae NOT DETECTED NOT DETECTED Final  MRSA PCR Screening     Status: None   Collection Time: 01/16/18 11:08 AM  Result Value Ref Range Status   MRSA by PCR NEGATIVE NEGATIVE Final    Comment:        The GeneXpert MRSA Assay (FDA approved for NASAL specimens only), is one component of a comprehensive MRSA colonization surveillance program. It is not intended to diagnose MRSA infection nor to guide or monitor treatment for MRSA infections. Performed at [facility], [place] [address] [place], [place] [address]   Culture, blood (Routine X 2) w Reflex to ID Panel     Status: None (Preliminary result)   Collection Time: 01/17/18 12:57 PM  Result Value Ref Range Status   Specimen Description   Final    BLOOD LEFT ARM Performed at [facility] [address] [place], [place] [address]    Special Requests   Final    BOTTLES DRAWN AEROBIC AND ANAEROBIC Blood Culture adequate volume   Culture    Final    NO GROWTH < 24 HOURS Performed at [facility], [place] [address] [place], [place] [address]    Report Status PENDING  Incomplete    Impression/Plan:  1. Bacteremia - MSSE.  On cefazolin and repeat blood culture negative.    2.  AV endocarditis - on treatment as above.  Prosthetic valve.  Will need CVTS evaluation.    3. Access - will need picc line at some point once repeat blood cultures show clearance.

## 2018-01-19 NOTE — Consult Note (Signed)
Cardiology Consultation:   Patient ID: Patrick Walter MRN: [id number]; DOB: 12-13-1955  Admit date: 01/15/2018 Date of Consult: 01/19/2018  Primary Care Provider: [name], MD Primary Cardiologist: Burt Knack Primary Electrophysiologist:  None    Patient Profile:   Patrick Walter is a 62 y.o. male with a hx of bioprosthetic AVR who is being seen today for the evaluation of endocarditis at the request of Dr. Wynelle Cleveland.  History of Present Illness:   Mr. Kakos presented to the emergency room October 27 with roughly 48 hours of fever and chills, but had preceding fatigue and anorexia for 2 or 3 weeks.  He has had some mild abdominal cramping but no diarrhea, nausea or vomiting or [place] GI bleeding.  Multiple blood cultures have been positive for methicillin sensitive staph epidermidis.  He has defervesced and is feeling better after a few days of antibiotics.  Transesophageal echocardiography performed today shows a very large vegetation attached to the leaflets of his biological aortic valve prosthesis (25 mm [employer] magna ease bovine pericardial tissue valve implanted in 2014 by Dr. Roxy Manns, for bicuspid aortic valve with aortic stenosis).  He has not had any symptoms to suggest embolic events or secondary foci of infection.  He did have dental work performed roughly 2 months ago but did receive appropriate amoxicillin antibiotic prophylaxis.  He had a steroid injection for chronic hip pain roughly December 01, 2017.  He does not have any symptoms involving his hip or his teeth at this time.  Prior to the acute illness he was doing extremely well.  Following his aortic valve replacement he paid a lot of attention to his health, began exercising regularly, lost 80 pounds of weight and actually ran a marathon 2 years after his aortic valve replacement.  Occasionally held back by pain in his hip.  His wife works in the [employer] system.  He has mild dilation of the ascending aorta  measured at 4.1 cm on the TEE today, comparable to the 4.2 cm measurement on the CT performed in 2017.  He has mild nonobstructive coronary atherosclerosis.  Had PFO closure at the time of his valve replacement.  His echocardiograms have shown moderately increased gradients across the prosthesis ever since implantation (mean gradient 21 mmHg), but he has never had symptoms related to this.  Past Medical History:  Diagnosis Date  . Aortic stenosis 07/24/2013  . Ascending aorta enlargement ([place])   . Bicuspid aortic valve   . Coronary artery disease   . Heart murmur   . Hx of echocardiogram    post AVR >> Echo (10/15):  Mild LVH, mod focal basal septal hypertrophy, EF 55-60%, Gr 1 DD, AVR ok (mean 15 mmHg), dilated ascending Aorta (44 mm), mod LAE  . Hypertension   . Patent foramen ovale 10/23/2013   Closed at the time of aortic valve replacement  . S/P aortic valve replacement with bioprosthetic valve 12/05/2013   25 mm [facility] Ease bovine pericardial tissue valve     Past Surgical History:  Procedure Laterality Date  . AORTIC VALVE REPLACEMENT N/A 12/05/2013   Procedure: AORTIC VALVE REPLACEMENT (AVR);  Surgeon: [name], MD;  Location: [facility];  Service: Open Heart Surgery;  Laterality: N/A;  . HAND SURGERY Left 07/1977  . INTRAOPERATIVE TRANSESOPHAGEAL ECHOCARDIOGRAM N/A 12/05/2013   Procedure: INTRAOPERATIVE TRANSESOPHAGEAL ECHOCARDIOGRAM;  Surgeon: [name], MD;  Location: [facility];  Service: Open Heart Surgery;  Laterality: N/A;  . LEFT AND RIGHT HEART CATHETERIZATION WITH CORONARY ANGIOGRAM  N/A 11/08/2013   Procedure: LEFT AND RIGHT HEART CATHETERIZATION WITH CORONARY ANGIOGRAM;  Surgeon: Blane Ohara, MD;  Location: [facility] CATH LAB;  Service: Cardiovascular;  Laterality: N/A;  . PATENT FORAMEN OVALE CLOSURE N/A 12/05/2013   Procedure: PATENT FORAMEN OVALE CLOSURE;  Surgeon: [name], MD;  Location: [facility];  Service: Open Heart Surgery;  Laterality: N/A;  . TEE WITHOUT  CARDIOVERSION N/A 10/23/2013   Procedure: TRANSESOPHAGEAL ECHOCARDIOGRAM (TEE);  Surgeon: Larey Dresser, MD;  Location: [facility];  Service: Cardiovascular;  Laterality: N/A;  . TEE WITHOUT CARDIOVERSION N/A 01/19/2018   Procedure: TRANSESOPHAGEAL ECHOCARDIOGRAM (TEE);  Surgeon: Sanda Klein, MD;  Location: [facility] ENDOSCOPY;  Service: Cardiovascular;  Laterality: N/A;  . VASECTOMY  06/2002     Home Medications:  Prior to Admission medications   Medication Sig Start Date End Date Taking? Authorizing Provider  amoxicillin (AMOXIL) 500 MG tablet Take 4 tablets (2,000 mg) one hour prior to dental appointments. 11/16/17  Yes [name], MD  aspirin 81 MG EC tablet Take 1 tablet (81 mg total) by mouth daily. 05/28/14  Yes [name], MD  Boswellia-Glucosamine-Vit D (GLUCOSAMINE COMPLEX PO) Take 1 capsule by mouth daily.   Yes [provider]  celecoxib (CELEBREX) 200 MG capsule TAKE 1 TO 2 CAPSULES BY MOUTH EVERY DAY AS NEEDED FOR PAIN Patient taking differently: Take 200-400 mg by mouth daily as needed for mild pain.  10/31/17  Yes [name], MD  Coenzyme Q10 300 MG CAPS Take 1 capsule by mouth daily.   Yes [provider]  Multiple Vitamin (MULTIVITAMIN WITH MINERALS) TABS tablet Take 1 tablet by mouth daily.   Yes [provider]  Omega 3 1000 MG CAPS Take 3 capsules by mouth daily.   Yes [provider]  Probiotic Product (PROBIOTIC PO) Take 1 capsule by mouth daily.   Yes [provider]  TURMERIC PO Take 1 capsule by mouth daily.   Yes [provider]  triamcinolone cream (KENALOG) 0.1 % Apply 1 application topically 2 (two) times daily. Patient not taking: Reported on 01/16/2018 06/10/17   [name], MD    Inpatient Medications: Scheduled Meds: . enoxaparin (LOVENOX) injection  40 mg Subcutaneous QHS   Continuous Infusions: . sodium chloride 250 mL (01/19/18 0612)  .  ceFAZolin (ANCEF) IV 2 g  (01/19/18 0612)   PRN Meds: sodium chloride, acetaminophen **OR** acetaminophen, ondansetron **OR** ondansetron (ZOFRAN) IV  Allergies:   No Known Allergies  Social History:   Social History   Socioeconomic History  . Marital status: Married    Spouse name: Not on file  . Number of children: 5  . Years of education: Not on file  . Highest education level: Not on file  Occupational History  . Occupation: Scientist, forensic: [employer]  . Financial resource strain: Not on file  . Food insecurity:    Worry: Not on file    Inability: Not on file  . Transportation needs:    Medical: Not on file    Non-medical: Not on file  Tobacco Use  . Smoking status: Never Smoker  . Smokeless tobacco: Never Used  Substance and Sexual Activity  . Alcohol use: Yes    Alcohol/week: 0.0 standard drinks    Comment: occ  . Drug use: No  . Sexual activity: Not on file  Lifestyle  . Physical activity:    Days per week: Not on file    Minutes  per session: Not on file  . Stress: Not on file  Relationships  . Social connections:    Talks on phone: Not on file    Gets together: Not on file    Attends religious service: Not on file    Active member of club or organization: Not on file    Attends meetings of clubs or organizations: Not on file    Relationship status: Not on file  . Intimate partner violence:    Fear of current or ex partner: Not on file    Emotionally abused: Not on file    Physically abused: Not on file    Forced sexual activity: Not on file  Other Topics Concern  . Not on file  Social History Narrative   The patient is married. He is originally from [place]. He works as an Art gallery manager. He does not smoke cigarettes or drink alcohol.    Family History:    Family History  Problem Relation Age of Onset  . Diabetes Mother   . Cancer Mother        colon  . Diabetes Father   . Cancer Father        colon  . Colon cancer Father 47  .  Diabetes Paternal Grandmother   . Cancer Paternal Grandmother   . Colon cancer Paternal Grandmother 31  . Cancer Daughter        leukemia  . Heart attack Maternal Grandmother   . Hypertension Maternal Grandmother   . Diabetes Maternal Grandmother   . Cancer Maternal Grandmother   . Diabetes Maternal Grandfather   . Cancer Maternal Grandfather   . Stroke Paternal Grandfather   . Diabetes Paternal Grandfather   . Cancer Paternal Grandfather      ROS:  Please see the history of present illness.   All other ROS reviewed and negative.     Physical Exam/Data:   Vitals:   01/19/18 1015 01/19/18 1020 01/19/18 1315 01/19/18 1407  BP: 119/77 119/77 125/74 106/72  Pulse:   71 83  Resp: (!) 24 17 11 15   Temp:   99.1 F (37.3 C) 98.3 F (36.8 C)  TempSrc:    Oral  SpO2:  95% 98% 97%  Weight:      Height:       No intake or output data in the 24 hours ending 01/19/18 1723 Filed Weights   01/15/18 1923  Weight: 80.7 kg   Body mass index is 24.83 kg/m.  General:  Well nourished, well developed, in no acute distress HEENT: normal Lymph: no adenopathy Neck: no JVD Endocrine:  No thryomegaly Vascular: No carotid bruits; FA pulses 2+ bilaterally without bruits  Cardiac:  normal S1, S2; RRR; early peaking 1-2/6 aortic ejection murmur; no diastolic murmur Lungs:  clear to auscultation bilaterally, no wheezing, rhonchi or rales  Abd: soft, nontender, no hepatomegaly  Ext: no edema Musculoskeletal:  No deformities, BUE and BLE strength normal and equal Skin: warm and dry .  Peripheral stigmata of endocarditis are not seen Neuro:  CNs 2-12 intact, no focal abnormalities noted Psych:  Normal affect   EKG:  The EKG was personally reviewed and demonstrates: Sinus rhythm with first-degree AV block (PR 240 ms), deep but fairly sharp narrow Q waves in leads II and aVF Telemetry:  Telemetry was personally reviewed and demonstrates: Sinus rhythm  Relevant CV Studies TEE 01/19/2018: -  Left ventricle: The cavity size was normal. There was mild   concentric hypertrophy. Systolic function was  normal. The   estimated ejection fraction was in the range of 60% to 65%. Wall   motion was normal; there were no regional wall motion   abnormalities. - Aortic valve: There was a large, 2.8 cm (L) x 1.2 cm (W) x 0.8   cm, mobile vegetation on the left ventricular aspect of the   anterior bioprosthetic cusp. There was mild regurgitation. - Ascending aorta: The ascending aorta was mildly dilated. - Mitral valve: No evidence of vegetation. There was mild   regurgitation. - Left atrium: The atrium was mildly dilated. No evidence of   thrombus in the atrial cavity or appendage. No spontaneous echo   contrast was observed. - Right atrium: No evidence of thrombus in the atrial cavity or   appendage. - Atrial septum: No defect or patent foramen ovale was identified. - Tricuspid valve: No evidence of vegetation. - Pulmonic valve: No evidence of vegetation.  Laboratory Data:  Chemistry Recent Labs  Lab 01/15/18 2019 01/16/18 0352 01/17/18 0644 01/18/18 0553 01/19/18 0603  NA 137 139  --   --  138  K 4.1 4.3  --   --  4.3  CL 105 108  --   --  104  CO2 24 24  --   --  26  GLUCOSE 103* 114*  --   --  106*  BUN 19 16  --   --  12  CREATININE 1.03 0.83 0.87 0.81 0.82  CALCIUM 9.5 9.0  --   --  10.4*  GFRNONAA >60 >60 >60 >60 >60  GFRAA >60 >60 >60 >60 >60  ANIONGAP 8 7  --   --  8    Recent Labs  Lab 01/15/18 2019 01/16/18 0352  PROT 6.6 5.7*  ALBUMIN 3.2* 2.7*  AST 28 21  ALT 18 17  ALKPHOS 70 54  BILITOT 0.9 1.0   Hematology Recent Labs  Lab 01/16/18 0735 01/17/18 0644 01/18/18 0553  WBC 16.8* 15.5* 14.8*  RBC 3.30*  3.31* 3.32* 3.35*  HGB 8.9* 9.0* 8.8*  HCT 28.9* 29.0* 29.2*  MCV 87.6 87.3 87.2  MCH 27.0 27.1 26.3  MCHC 30.8 31.0 30.1  RDW 13.6 13.5 13.3  PLT 176 196 251   Cardiac EnzymesNo results for input(s): TROPONINI in the last 168 hours. No  results for input(s): TROPIPOC in the last 168 hours.  BNPNo results for input(s): BNP, PROBNP in the last 168 hours.  DDimer No results for input(s): DDIMER in the last 168 hours.  Radiology/Studies:  Dg Chest 2 View  Result Date: 01/15/2018 CLINICAL DATA:  Flu like symptoms with fever but without cough or congestion. EXAM: CHEST - 2 VIEW COMPARISON:  01/07/2014 FINDINGS: Slight ectasia of the thoracic aorta. Median sternotomy sutures are redemonstrated. Normal sized heart is noted. Clear lungs without pulmonary consolidation, effusion or pneumothorax. No overt pulmonary edema. No acute osseous abnormality. IMPRESSION: No active cardiopulmonary disease. Electronically Signed   By: Ashley Royalty M.D.   On: 01/15/2018 20:12    Assessment and Plan:   1. AV prosthetic endocarditis (Staph. epi): He seems to be responding clinically to therapy, fever has resolved and WBC is improving, although he still has an elevated white blood cell count.  The protracted course of his symptoms, the presence of anorexia and the development of anemia and hypoalbuminemia are consistent with a slow, subacute course of his endocarditis.  The vegetation is quite large (also probably showing the longer course of the illness), but otherwise its aortic valve location  makes it less likely to be a source of embolic events.  Fortunately, he does not have aortic insufficiency of any significance. Even though at this point there is no hemodynamic indication for replacement of the prosthesis, in view of his young age, otherwise excellent life expectancy and the very large size of the vegetation, I am convinced that he will require redo aortic valve replacement.  The timing of surgery will have to be carefully judged and should wait until we have achieved sterilization of blood cultures, at least.  He did not have any meaningful coronary artery disease at the time of his previous catheterization, is extremely active, has no symptoms of  coronary insufficiency and has no visible plaque in his aorta on transesophageal echo.  I think the risks of repeating cardiac catheterization and coronary angiography (and possibly dislodging his large vegetation), outweigh any benefit  that diagnostic procedure might provide.   2. Asc Ao dilation: Minimal, stable, as per Dr. Guy Sandifer note from several months ago unlikely to progress as long as his blood pressure is well controlled.      For questions or updates, please contact [facility] Please consult [email or web address] for contact info under     Signed, Sanda Klein, MD  01/19/2018 5:23 PM

## 2018-01-19 NOTE — Progress Notes (Signed)
Endoscopy recovery complete 01/19/2018 at 1020. Patient hold in endo for step down bed at [place]. Patient I awake, alert, family at bedside.

## 2018-01-19 NOTE — Interval H&P Note (Signed)
History and Physical Interval Note:  01/19/2018 9:29 AM  Patrick Walter  has presented today for surgery, with the diagnosis of BACTEREMIA  The various methods of treatment have been discussed with the patient and family. After consideration of risks, benefits and other options for treatment, the patient has consented to  Procedure(s): TRANSESOPHAGEAL ECHOCARDIOGRAM (TEE) (N/A) as a surgical intervention .  The patient's history has been reviewed, patient examined, no change in status, stable for surgery.  I have reviewed the patient's chart and labs.  Questions were answered to the patient's satisfaction.     Valda Christenson

## 2018-01-19 NOTE — Progress Notes (Signed)
PROGRESS NOTE    Patrick Walter   FXT:[id number]  DOB: 10-08-1955  DOA: 01/15/2018 PCP: [name], MD   Brief Narrative:  Patrick Walter is an 62 y.o. male past medical history of bicuspid aortic valve status post bioprosthetic valve replacement 2015, status post PFO closure who received an injection for osteoarthritis 1 month prior to admission comes into the [facility] ED with fever chills body aches for the last 2 days.  In the ED he was found to be hypotensive with a temperature of 102. Started on empiric antibiotics. Blood cultures revealed Staph Epi.  Subjective: Seen prior to TEE. Having on and off mild abdominal cramping which improves with Zofran. No diarrhea, nausea or vomiting.     Assessment & Plan:   Principal Problem:   Sepsis due to methicillin susceptible Staphylococcus epidermidis  - Bacteremia due to Staphylococcus aureus in setting of bioprosthetic AVR - h/o dental work in Aug for which he took Amoxil - h/o steroid injection in hip 1 mo ago - Influenza PCR, resp panel and strep throat were negative. - HIV is nonreactive   - ID consulted-  Rocephin switched to Ancef on 10/29 by ID - Transferred to [facility] for TEE- I was called by Dr Croitoru> TEE reveals large vegetation on prosthetic aortic valve- I have spoken with Johann Capers from CVTS to request a consult - blood cultures from 10/29 are negative   Active Problems:     S/P aortic valve replacement with bioprosthetic valve - see above- CVTS consulted    Patent foramen ovale   Ascending aorta enlargement    Moderate d CHF - per ECHO in 2018- per TEE today, has EF 60% and mild MR and AI     Primary osteoarthritis of left hip - Celebrex on hold     Normocytic anemia- AOCD  DVT prophylaxis: Lovenox Code Status: Full code Family Communication:  Disposition Plan: will stay at [facility] Consultants:   ID  Cardiology  CVTS Procedures:   TEE Antimicrobials:  Anti-infectives (From  admission, onward)   Start     Dose/Rate Route Frequency Ordered Stop   01/17/18 1400  ceFAZolin (ANCEF) IVPB 2g/100 mL premix     2 g 200 mL/hr over 30 Minutes Intravenous Every 8 hours 01/17/18 1156     01/17/18 1030  cefTRIAXone (ROCEPHIN) 2 g in sodium chloride 0.9 % 100 mL IVPB  Status:  Discontinued     2 g 200 mL/hr over 30 Minutes Intravenous Every 24 hours 01/17/18 0919 01/17/18 1156   01/16/18 0800  vancomycin (VANCOCIN) 1,750 mg in sodium chloride 0.9 % 500 mL IVPB  Status:  Discontinued     1,750 mg 250 mL/hr over 120 Minutes Intravenous Every 24 hours 01/16/18 0136 01/17/18 0919   01/16/18 0600  piperacillin-tazobactam (ZOSYN) IVPB 3.375 g  Status:  Discontinued     3.375 g 12.5 mL/hr over 240 Minutes Intravenous Every 8 hours 01/16/18 0136 01/17/18 0918   01/15/18 2130  piperacillin-tazobactam (ZOSYN) IVPB 3.375 g     3.375 g 100 mL/hr over 30 Minutes Intravenous  Once 01/15/18 2123 01/15/18 2211   01/15/18 2130  vancomycin (VANCOCIN) IVPB 1000 mg/200 mL premix     1,000 mg 200 mL/hr over 60 Minutes Intravenous  Once 01/15/18 2123 01/15/18 2313   01/15/18 2130  oseltamivir (TAMIFLU) capsule 75 mg     75 mg Oral  Once 01/15/18 2123 01/15/18 2134       Objective: Vitals:   01/19/18 1015  01/19/18 1020 01/19/18 1315 01/19/18 1407  BP: 119/77 119/77 125/74 106/72  Pulse:   71 83  Resp: (!) 24 17 11 15   Temp:   99.1 F (37.3 C) 98.3 F (36.8 C)  TempSrc:    Oral  SpO2:  95% 98% 97%  Weight:      Height:       No intake or output data in the 24 hours ending 01/19/18 1434 Filed Weights   01/15/18 1923  Weight: 80.7 kg    Examination: General exam: Appears comfortable  HEENT: PERRLA, oral mucosa moist, no sclera icterus or thrush Respiratory system: Clear to auscultation. Respiratory effort normal. Cardiovascular system: S1 & S2 heard 2/6 murmur at LUS border Gastrointestinal system: Abdomen soft, non-tender, nondistended. Normal bowel sound. No  organomegaly Central nervous system: Alert and oriented. No focal neurological deficits. Extremities: No cyanosis, clubbing or edema Skin: No rashes or ulcers Psychiatry:  Mood & affect appropriate.     Data Reviewed: I have personally reviewed following labs and imaging studies  CBC: Recent Labs  Lab 01/15/18 2019 01/16/18 0352 01/16/18 0735 01/17/18 0644 01/18/18 0553  WBC 16.8* 16.0* 16.8* 15.5* 14.8*  NEUTROABS 13.2* 12.1*  --  11.5*  --   HGB 10.4* 9.1* 8.9* 9.0* 8.8*  HCT 33.7* 29.8* 28.9* 29.0* 29.2*  MCV 86.4 87.4 87.6 87.3 87.2  PLT 210 177 176 196 096   Basic Metabolic Panel: Recent Labs  Lab 01/15/18 2019 01/16/18 0352 01/17/18 0644 01/18/18 0553 01/19/18 0603  NA 137 139  --   --  138  K 4.1 4.3  --   --  4.3  CL 105 108  --   --  104  CO2 24 24  --   --  26  GLUCOSE 103* 114*  --   --  106*  BUN 19 16  --   --  12  CREATININE 1.03 0.83 0.87 0.81 0.82  CALCIUM 9.5 9.0  --   --  10.4*   GFR: Estimated Creatinine Clearance: 102 mL/min (by C-G formula based on SCr of 0.82 mg/dL). Liver Function Tests: Recent Labs  Lab 01/15/18 2019 01/16/18 0352  AST 28 21  ALT 18 17  ALKPHOS 70 54  BILITOT 0.9 1.0  PROT 6.6 5.7*  ALBUMIN 3.2* 2.7*   No results for input(s): LIPASE, AMYLASE in the last 168 hours. No results for input(s): AMMONIA in the last 168 hours. Coagulation Profile: No results for input(s): INR, PROTIME in the last 168 hours. Cardiac Enzymes: No results for input(s): CKTOTAL, CKMB, CKMBINDEX, TROPONINI in the last 168 hours. BNP (last 3 results) No results for input(s): PROBNP in the last 8760 hours. HbA1C: No results for input(s): HGBA1C in the last 72 hours. CBG: No results for input(s): GLUCAP in the last 168 hours. Lipid Profile: No results for input(s): CHOL, HDL, LDLCALC, TRIG, CHOLHDL, LDLDIRECT in the last 72 hours. Thyroid Function Tests: No results for input(s): TSH, T4TOTAL, FREET4, T3FREE, THYROIDAB in the last 72  hours. Anemia Panel: No results for input(s): VITAMINB12, FOLATE, FERRITIN, TIBC, IRON, RETICCTPCT in the last 72 hours. Urine analysis:    Component Value Date/Time   COLORURINE YELLOW 01/15/2018 2214   APPEARANCEUR CLEAR 01/15/2018 2214   LABSPEC 1.010 01/15/2018 2214   PHURINE 7.0 01/15/2018 2214   GLUCOSEU NEGATIVE 01/15/2018 2214   HGBUR NEGATIVE 01/15/2018 2214   BILIRUBINUR NEGATIVE 01/15/2018 2214   KETONESUR NEGATIVE 01/15/2018 2214   PROTEINUR NEGATIVE 01/15/2018 2214   UROBILINOGEN 0.2 12/03/2013  [place] 01/15/2018 2214   LEUKOCYTESUR NEGATIVE 01/15/2018 2214   Sepsis Labs: @LABRCNTIP (procalcitonin:4,lacticidven:4) ) Recent Results (from the past 240 hour(s))  Culture, blood (Routine X 2) w Reflex to ID Panel     Status: Abnormal (Preliminary result)   Collection Time: 01/15/18  8:15 PM  Result Value Ref Range Status   Specimen Description   Final    BLOOD RIGHT HAND Performed at [facility], [address] [place], [place] [address]    Special Requests   Final    BOTTLES DRAWN AEROBIC AND ANAEROBIC Blood Culture adequate volume Performed at [facility], [address] [place], [place] [address]    Culture  Setup Time   Final    IN BOTH AEROBIC AND ANAEROBIC BOTTLES GRAM POSITIVE COCCI CRITICAL VALUE NOTED.  VALUE IS CONSISTENT WITH PREVIOUSLY REPORTED AND CALLED VALUE. Performed at [facility], [place] [address], [place], [place] [address]    Culture STAPHYLOCOCCUS EPIDERMIDIS (A)  Final   Report Status PENDING  Incomplete  Culture, blood (Routine X 2) w Reflex to ID Panel     Status: Abnormal   Collection Time: 01/15/18  8:15 PM  Result Value Ref Range Status   Specimen Description   Final    BLOOD RIGHT ARM Performed at [facility], [address] [place], [place] [address]    Special Requests   Final    BOTTLES DRAWN AEROBIC AND ANAEROBIC Blood Culture adequate volume Performed at [facility], [address] [place], [place] [address]    Culture  Setup Time   Final    GRAM POSITIVE COCCI IN BOTH AEROBIC AND ANAEROBIC BOTTLES CRITICAL RESULT CALLED TO, READ BACK BY AND VERIFIED WITH: Sheffield Slider [facility] 01/16/18 1937 JDW    Culture (A)  Final    STAPHYLOCOCCUS EPIDERMIDIS SUSCEPTIBILITIES PERFORMED ON PREVIOUS CULTURE WITHIN THE LAST 5 DAYS. Performed at [facility], [place] [address] [place], [place] [address]    Report Status 01/18/2018 FINAL  Final   Organism ID, Bacteria STAPHYLOCOCCUS EPIDERMIDIS  Final      Susceptibility   Staphylococcus epidermidis - MIC*    CIPROFLOXACIN <=0.5 SENSITIVE Sensitive     ERYTHROMYCIN <=0.25 SENSITIVE Sensitive     GENTAMICIN <=0.5 SENSITIVE Sensitive     OXACILLIN <=0.25 SENSITIVE Sensitive     TETRACYCLINE <=1 SENSITIVE Sensitive     VANCOMYCIN 1 SENSITIVE Sensitive     TRIMETH/SULFA <=10 SENSITIVE Sensitive     CLINDAMYCIN <=0.25 SENSITIVE Sensitive     RIFAMPIN <=0.5 SENSITIVE Sensitive     Inducible Clindamycin NEGATIVE Sensitive     * STAPHYLOCOCCUS EPIDERMIDIS  Blood Culture ID Panel (Reflexed)     Status: Abnormal   Collection Time: 01/15/18  8:15 PM  Result Value Ref Range Status   Enterococcus species NOT DETECTED NOT DETECTED Final   Listeria monocytogenes NOT DETECTED NOT DETECTED Final   Staphylococcus species DETECTED (A) NOT DETECTED Final    Comment: Methicillin (oxacillin) susceptible coagulase negative staphylococcus. Possible blood culture contaminant (unless isolated from more than one blood culture draw or clinical case suggests pathogenicity). No antibiotic treatment is indicated for blood  culture contaminants. CRITICAL RESULT CALLED TO, READ BACK BY AND VERIFIED WITH: Henry Russel [facility] 01/16/18 1937 JDW    Staphylococcus aureus (BCID) NOT DETECTED NOT DETECTED Final   Methicillin resistance NOT DETECTED NOT DETECTED Final   Streptococcus species NOT DETECTED NOT  DETECTED Final    Streptococcus agalactiae NOT DETECTED NOT DETECTED Final   Streptococcus pneumoniae NOT DETECTED NOT DETECTED Final   Streptococcus pyogenes NOT DETECTED NOT DETECTED Final   Acinetobacter baumannii NOT DETECTED NOT DETECTED Final   Enterobacteriaceae species NOT DETECTED NOT DETECTED Final   Enterobacter cloacae complex NOT DETECTED NOT DETECTED Final   Escherichia coli NOT DETECTED NOT DETECTED Final   Klebsiella oxytoca NOT DETECTED NOT DETECTED Final   Klebsiella pneumoniae NOT DETECTED NOT DETECTED Final   Proteus species NOT DETECTED NOT DETECTED Final   Serratia marcescens NOT DETECTED NOT DETECTED Final   Haemophilus influenzae NOT DETECTED NOT DETECTED Final   Neisseria meningitidis NOT DETECTED NOT DETECTED Final   Pseudomonas aeruginosa NOT DETECTED NOT DETECTED Final   Candida albicans NOT DETECTED NOT DETECTED Final   Candida glabrata NOT DETECTED NOT DETECTED Final   Candida krusei NOT DETECTED NOT DETECTED Final   Candida parapsilosis NOT DETECTED NOT DETECTED Final   Candida tropicalis NOT DETECTED NOT DETECTED Final    Comment: Performed at [facility], [place] [address], [place], [place] [address]  Group A Strep by PCR     Status: None   Collection Time: 01/15/18  8:19 PM  Result Value Ref Range Status   Group A Strep by PCR NOT DETECTED NOT DETECTED Final    Comment: Performed at [facility], [address] [place], [place] [address]  Respiratory Panel by PCR     Status: None   Collection Time: 01/16/18  7:52 AM  Result Value Ref Range Status   Adenovirus NOT DETECTED NOT DETECTED Final   Coronavirus 229E NOT DETECTED NOT DETECTED Final   Coronavirus HKU1 NOT DETECTED NOT DETECTED Final   Coronavirus NL63 NOT DETECTED NOT DETECTED Final   Coronavirus OC43 NOT DETECTED NOT DETECTED Final   Metapneumovirus NOT DETECTED NOT DETECTED Final   Rhinovirus / Enterovirus NOT DETECTED NOT DETECTED Final   Influenza A NOT DETECTED NOT DETECTED Final    Influenza B NOT DETECTED NOT DETECTED Final   Parainfluenza Virus 1 NOT DETECTED NOT DETECTED Final   Parainfluenza Virus 2 NOT DETECTED NOT DETECTED Final   Parainfluenza Virus 3 NOT DETECTED NOT DETECTED Final   Parainfluenza Virus 4 NOT DETECTED NOT DETECTED Final   Respiratory Syncytial Virus NOT DETECTED NOT DETECTED Final   Bordetella pertussis NOT DETECTED NOT DETECTED Final   Chlamydophila pneumoniae NOT DETECTED NOT DETECTED Final   Mycoplasma pneumoniae NOT DETECTED NOT DETECTED Final  MRSA PCR Screening     Status: None   Collection Time: 01/16/18 11:08 AM  Result Value Ref Range Status   MRSA by PCR NEGATIVE NEGATIVE Final    Comment:        The GeneXpert MRSA Assay (FDA approved for NASAL specimens only), is one component of a comprehensive MRSA colonization surveillance program. It is not intended to diagnose MRSA infection nor to guide or monitor treatment for MRSA infections. Performed at [facility], [place] [address], [place], [place] [address]   Culture, blood (Routine X 2) w Reflex to ID Panel     Status: None (Preliminary result)   Collection Time: 01/17/18 12:57 PM  Result Value Ref Range Status   Specimen Description   Final    BLOOD LEFT ARM Performed at [place] [address] [place], [place] [address]    Special Requests   Final    BOTTLES DRAWN AEROBIC AND ANAEROBIC Blood Culture  adequate volume   Culture   Final    NO GROWTH < 24 HOURS Performed at [facility], [place] [address], [place], [place] [address]    Report Status PENDING  Incomplete         Radiology Studies: No results found.    Scheduled Meds: . enoxaparin (LOVENOX) injection  40 mg Subcutaneous QHS   Continuous Infusions: . sodium chloride 250 mL (01/19/18 0612)  .  ceFAZolin (ANCEF) IV 2 g (01/19/18 0612)     LOS: 4 days    Time spent in minutes: 35    Debbe Odea, MD [facility] Pager: [email or web address] Password [facility] 01/19/2018, 2:34 PM

## 2018-01-19 NOTE — Plan of Care (Signed)
  Problem: Activity: Goal: Risk for activity intolerance will decrease Outcome: Progressing   Problem: Nutrition: Goal: Adequate nutrition will be maintained Outcome: Progressing   Problem: Pain Managment: Goal: General experience of comfort will improve Outcome: Progressing   

## 2018-01-20 ENCOUNTER — Encounter (HOSPITAL_COMMUNITY): Payer: Self-pay | Admitting: Thoracic Surgery (Cardiothoracic Vascular Surgery)

## 2018-01-20 ENCOUNTER — Inpatient Hospital Stay (HOSPITAL_COMMUNITY): Payer: 59

## 2018-01-20 ENCOUNTER — Encounter: Payer: Self-pay | Admitting: Sports Medicine

## 2018-01-20 DIAGNOSIS — I39 Endocarditis and heart valve disorders in diseases classified elsewhere: Secondary | ICD-10-CM

## 2018-01-20 DIAGNOSIS — I350 Nonrheumatic aortic (valve) stenosis: Secondary | ICD-10-CM

## 2018-01-20 HISTORY — PX: PILONIDAL CYST / SINUS EXCISION: SUR543

## 2018-01-20 LAB — CBC
HEMATOCRIT: 32.3 % — AB (ref 39.0–52.0)
HEMOGLOBIN: 9.5 g/dL — AB (ref 13.0–17.0)
MCH: 25.9 pg — ABNORMAL LOW (ref 26.0–34.0)
MCHC: 29.4 g/dL — AB (ref 30.0–36.0)
MCV: 88 fL (ref 80.0–100.0)
NRBC: 0 % (ref 0.0–0.2)
Platelets: 356 10*3/uL (ref 150–400)
RBC: 3.67 MIL/uL — ABNORMAL LOW (ref 4.22–5.81)
RDW: 13.3 % (ref 11.5–15.5)
WBC: 14.7 10*3/uL — AB (ref 4.0–10.5)

## 2018-01-20 LAB — COMPREHENSIVE METABOLIC PANEL
ALT: 40 U/L (ref 0–44)
AST: 40 U/L (ref 15–41)
Albumin: 2.8 g/dL — ABNORMAL LOW (ref 3.5–5.0)
Alkaline Phosphatase: 98 U/L (ref 38–126)
Anion gap: 6 (ref 5–15)
BILIRUBIN TOTAL: 0.6 mg/dL (ref 0.3–1.2)
BUN: 10 mg/dL (ref 6–20)
CHLORIDE: 105 mmol/L (ref 98–111)
CO2: 28 mmol/L (ref 22–32)
CREATININE: 1.03 mg/dL (ref 0.61–1.24)
Calcium: 10.4 mg/dL — ABNORMAL HIGH (ref 8.9–10.3)
Glucose, Bld: 117 mg/dL — ABNORMAL HIGH (ref 70–99)
POTASSIUM: 4.4 mmol/L (ref 3.5–5.1)
Sodium: 139 mmol/L (ref 135–145)
Total Protein: 6.3 g/dL — ABNORMAL LOW (ref 6.5–8.1)

## 2018-01-20 MED ORDER — SILVER NITRATE-POT NITRATE 75-25 % EX MISC
3.0000 | CUTANEOUS | Status: AC
  Administered 2018-01-20: 3 via TOPICAL
  Filled 2018-01-20: qty 3

## 2018-01-20 MED ORDER — BISACODYL 5 MG PO TBEC: 5.0000 mg | DELAYED_RELEASE_TABLET | Freq: Every day | ORAL | Status: DC | PRN

## 2018-01-20 MED ORDER — RIFAMPIN 300 MG PO CAPS
300.0000 mg | ORAL_CAPSULE | Freq: Three times a day (TID) | ORAL | Status: DC
  Administered 2018-01-20 – 2018-01-25 (×16): 300 mg via ORAL
  Filled 2018-01-20 (×18): qty 1

## 2018-01-20 MED ORDER — GADOBUTROL 1 MMOL/ML IV SOLN
8.0000 mL | Freq: Once | INTRAVENOUS | Status: AC | PRN
  Administered 2018-01-20: 8 mL via INTRAVENOUS

## 2018-01-20 MED ORDER — LIDOCAINE HCL (PF) 2 % IJ SOLN
10.0000 mL | Freq: Once | INTRAMUSCULAR | Status: DC
  Filled 2018-01-20: qty 10

## 2018-01-20 MED ORDER — RIFAMPIN 300 MG PO CAPS
300.0000 mg | ORAL_CAPSULE | Freq: Three times a day (TID) | ORAL | Status: DC
  Filled 2018-01-20: qty 1

## 2018-01-20 MED ORDER — GENTAMICIN IN SALINE 1.2-0.9 MG/ML-% IV SOLN
120.0000 mg | Freq: Once | INTRAVENOUS | Status: AC
  Administered 2018-01-20: 120 mg via INTRAVENOUS
  Filled 2018-01-20: qty 100

## 2018-01-20 MED ORDER — LIDOCAINE HCL (CARDIAC) PF 100 MG/5ML IV SOSY
PREFILLED_SYRINGE | INTRAVENOUS | Status: AC
  Administered 2018-01-20: 15:00:00
  Filled 2018-01-20: qty 5

## 2018-01-20 MED ORDER — GENTAMICIN IN SALINE 1.6-0.9 MG/ML-% IV SOLN
80.0000 mg | Freq: Three times a day (TID) | INTRAVENOUS | Status: DC
  Administered 2018-01-20 – 2018-01-22 (×6): 80 mg via INTRAVENOUS
  Filled 2018-01-20 (×6): qty 50

## 2018-01-20 NOTE — Procedures (Signed)
Excision of Pilonidal Cyst Procedure Note  Pre-operative Diagnosis: chronic pilonidal cyst tract  Post-operative Diagnosis: same  Indications: This is a 62 yo male who has endocarditis of his previously replaced aortic valve in 2015.  The patient is set up next week to have this replaced again by Dr. Roxy Manns.  Prior to his procedure, he was noted to have a chronic sinus tract from previous pilonidal cysts.  A request was made for excision to help eliminate possible infectious sources.    Anesthesia: 2% plain lidocaine  Procedure Details  The procedure, risks and complications have been discussed in detail (including, but not limited to infection, bleeding) with the patient, and the patient has signed consent to the procedure.  The skin was sterilely prepped and draped over the affected area in the usual fashion. After adequate local anesthesia, an elliptical incision was made with a #11 blade over the superior aspect of his gluteal cleft where his chronic sinus tract was located.  There was no evidence of infection.  Raw surface oozing was present and direct pressure was held. Ultimately hemostasis was definitively achieved with the use of silver nitrate sticks.  Purulent drainage: absent The patient was observed until stable.  Findings: Chronic sinus tract  EBL: 10 cc's  Drains: none  Condition: Tolerated procedure well and Stable   Complications: none.  Henreitta Cea 3:36 PM 01/20/2018

## 2018-01-20 NOTE — Progress Notes (Signed)
Progress Note  Patient Name: Patrick Walter Date of Encounter: 01/20/2018  Primary Cardiologist: [name], MD   Subjective   No significant overnight events. Patients states he feeling a lot better today. He notes some abdominal pain after taking the antibiotics but has no other complaints. No chest pain or shortness of breath. Patient afebrile.  Inpatient Medications    Scheduled Meds: . enoxaparin (LOVENOX) injection  40 mg Subcutaneous QHS   Continuous Infusions: . sodium chloride 250 mL (01/19/18 0612)  .  ceFAZolin (ANCEF) IV Stopped (01/20/18 0257)   PRN Meds: sodium chloride, acetaminophen **OR** acetaminophen, bisacodyl, ondansetron **OR** ondansetron (ZOFRAN) IV   Vital Signs    Vitals:   01/19/18 1947 01/20/18 0037 01/20/18 0318 01/20/18 0810  BP: 112/79 116/82  (!) 119/91  Pulse: 72 78 78 78  Resp: 15 (!) 28  20  Temp: 98.5 F (36.9 C) 98.1 F (36.7 C) 98.5 F (36.9 C) 98.3 F (36.8 C)  TempSrc: Oral Oral Oral Oral  SpO2: 96% 99% 98% 99%  Weight:      Height:        Intake/Output Summary (Last 24 hours) at 01/20/2018 0845 Last data filed at 01/20/2018 0225 Gross per 24 hour  Intake 100 ml  Output -  Net 100 ml   Filed Weights   01/15/18 1923  Weight: 80.7 kg    Telemetry    Normal sinus rhythm - Personally Reviewed  Physical Exam   GEN: 62 year old Caucasian male sitting comfortably in chair in no acute distress.   Neck: Supple.  Cardiac: RRR. Distinct S1 and S2. Soft 2/6 systolic murmur. No rubs or gallops appreciated.  Respiratory: No increased work of breathing. Clear to auscultation bilaterally. GI: Abdomen soft, nontender, non-distended. Bowel sounds present.  MS: No lower extremity edema. Radial pulses 2+ and equal bilaterally. Neuro:  No focal deficits.  Psych: Normal affect.  Labs    Chemistry Recent Labs  Lab 01/15/18 2019 01/16/18 0352  01/18/18 0553 01/19/18 0603 01/20/18 0330  NA 137 139  --   --  138 139  K  4.1 4.3  --   --  4.3 4.4  CL 105 108  --   --  104 105  CO2 24 24  --   --  26 28  GLUCOSE 103* 114*  --   --  106* 117*  BUN 19 16  --   --  12 10  CREATININE 1.03 0.83   < > 0.81 0.82 1.03  CALCIUM 9.5 9.0  --   --  10.4* 10.4*  PROT 6.6 5.7*  --   --   --  6.3*  ALBUMIN 3.2* 2.7*  --   --   --  2.8*  AST 28 21  --   --   --  40  ALT 18 17  --   --   --  40  ALKPHOS 70 54  --   --   --  98  BILITOT 0.9 1.0  --   --   --  0.6  GFRNONAA >60 >60   < > >60 >60 >60  GFRAA >60 >60   < > >60 >60 >60  ANIONGAP 8 7  --   --  8 6   < > = values in this interval not displayed.     Hematology Recent Labs  Lab 01/17/18 0644 01/18/18 0553 01/20/18 0330  WBC 15.5* 14.8* 14.7*  RBC 3.32* 3.35* 3.67*  HGB 9.0*  8.8* 9.5*  HCT 29.0* 29.2* 32.3*  MCV 87.3 87.2 88.0  MCH 27.1 26.3 25.9*  MCHC 31.0 30.1 29.4*  RDW 13.5 13.3 13.3  PLT 196 251 356    Cardiac EnzymesNo results for input(s): TROPONINI in the last 168 hours. No results for input(s): TROPIPOC in the last 168 hours.   BNPNo results for input(s): BNP, PROBNP in the last 168 hours.   DDimer No results for input(s): DDIMER in the last 168 hours.   Radiology    No results found.  Cardiac Studies   Transesophageal Echocardiogram 01/19/2018: Study Conclusions: - Left ventricle: The cavity size was normal. There was mild   concentric hypertrophy. Systolic function was normal. The   estimated ejection fraction was in the range of 60% to 65%. Wall   motion was normal; there were no regional wall motion   abnormalities. - Aortic valve: There was a large, 2.8 cm (L) x 1.2 cm (W) x 0.8   cm, mobile vegetation on the left ventricular aspect of the   anterior bioprosthetic cusp. There was mild regurgitation. - Ascending aorta: The ascending aorta was mildly dilated. - Mitral valve: No evidence of vegetation. There was mild   regurgitation. - Left atrium: The atrium was mildly dilated. No evidence of   thrombus in the atrial  cavity or appendage. No spontaneous echo   contrast was observed. - Right atrium: No evidence of thrombus in the atrial cavity or   appendage. - Atrial septum: No defect or patent foramen ovale was identified. - Tricuspid valve: No evidence of vegetation. - Pulmonic valve: No evidence of vegetation.  Patient Profile     Patrick Walter is a 62 y.o. male with a history of bioprosthetic AVR who is being seen today for the evaluation of endocarditis at the request of Dr. Wynelle Cleveland  Assessment & Plan    1. AV Prosthetic Endocarditis - Patient presented to the ED on 01/15/2018 with fever and chills x48 hours and fatigue and anorexia x2-3 weeks. Multiple blood cultures positive for methicillin sensitive staph epidermis.  - Patient on IV antibiotics and is responding to therapy well. He is afebrile and states he is feeling better today. WBC still elevated at 14.7 today but slowly trending down.  - TEE yesterday showed large mobile vegetation on the left ventricular aspect of the anterior bioprosthetic cusp. - The protracted course of his symptoms, the development of anemia and hypoalbuminemia, and the large vegetation are suggestive of a slow, subacute course of endocarditis.  - Per Dr. Victorino December note yesterday, he is convinced that patient will require a redo aortic valve replacement. However, we should wait until sterilization of blood cultures has been achieved. Most recent blood culture drawn on 01/17/2018 showed no growth.   2. Ascending Aorta Dilation - Minimal and stable.  - Per Dr. Guy Sandifer note from several months ago, unlikely to progress as long as blood pressure is well controlled.    For questions or updates, please contact [place] Please consult [email or web address] for contact info under        Signed, Darreld Mclean, PA-C  01/20/2018, 8:45 AM

## 2018-01-20 NOTE — Consult Note (Addendum)
[address]       [place] [address]             [id number]          CARDIOTHORACIC SURGERY CONSULTATION REPORT  PCP is Patrick Field Gwen Her, MD Referring Provider is Croitoru, Dani Gobble, MD Primary Cardiologist is Patrick Mocha, MD Infectious Disease Consultant is Patrick Riches, MD  Reason for consultation:  Prosthetic valve endocarditis  HPI:  [name] status post aortic valve replacement using a bioprosthetic tissue valve in 2015 for bicuspid aortic valve disease with severe symptomatic aortic stenosis who has been referred for surgical consultation to discuss treatment options for management of prosthetic valve endocarditis.  Patient is well-known to me from his previous surgery in 2015.  At the time he presented with symptoms of exertional chest tightness and he was diagnosed with bicuspid aortic valve disease and severe aortic stenosis.  He underwent elective aortic valve replacement using a 25 mm Edwards magna ease stented bovine pericardial tissue valve on December 05, 2013.  His postoperative convalescence was entirely uneventful.  Since then he has done quite well.  He got back into regular physical exercise and has even run a marathon.  He previously had been moderately obese, but over the past 4 years he lost nearly 100 pounds in weight.  He was last seen here in our office on December 13, 2016 at which time he was doing remarkably well.  He states that little over a month ago he began to experience low-grade fevers, body aches, and generalized malaise.  He thought he had come down with a case of the flu.  Symptoms seem to wax and wane initially but they have persisted and gotten worse.  He lost his appetite although he did not experience significant weight loss.  He presented to the emergency department January 16, 2018 with fever greater than 102 and leukocytosis with white blood count 16,800.  He was mildly hypotensive.  4 of 4  subsequently drawn blood cultures were positive for methicillin sensitive Staphylococcus epidermidis.  He was hospitalized and seen in consultation by Dr. Johnnye Walter from the infectious disease team.  Transesophageal echocardiogram revealed vegetations on the aortic valve consistent with prosthetic valve endocarditis.  Cardiothoracic surgical consultation was requested.  Patient is married and lives locally in [place] with his wife and family.  He works as an Art gallery manager for [employer].  He has remained quite active physically and enjoys running on a regular basis.  He had a routine dental exam and some dental work done within the last few months, but oral amoxicillin was given prior to all dental work per protocol.  In September he underwent steroid injection into his left hip for degenerative arthritis.  He notes that he has had a recurrent cyst in his presacral region which has occasionally gotten locally infected and associated with drainage of purulent material.  He denies any other significant skin infections.  He states that since hospital admission he is starting to feel little better.  He still having some low-grade fevers and malaise.  Appetite remains marginal.  He denies any shortness of breath either with activity or at rest.  He has not had any transient visual disturbances.  He reports some occasional headaches associated with fevers.  He denies any transient numbness or weakness involving either upper or lower extremity.  He has not had any significant abdominal pain or back pain.  His hip is not bothering  him at all presently.  Past Medical History:  Diagnosis Date  . Aortic stenosis 07/24/2013  . Ascending aorta enlargement ([place])   . Bicuspid aortic valve   . Coronary artery disease   . Heart murmur   . Hx of echocardiogram    post AVR >> Echo (10/15):  Mild LVH, mod focal basal septal hypertrophy, EF 55-60%, Gr 1 DD, AVR ok (mean 15 mmHg), dilated ascending Aorta (44 mm), mod LAE    . Hypertension   . Patent foramen ovale 10/23/2013   Closed at the time of aortic valve replacement  . S/P aortic valve replacement with bioprosthetic valve 12/05/2013   25 mm [facility] Ease bovine pericardial tissue valve     Past Surgical History:  Procedure Laterality Date  . AORTIC VALVE REPLACEMENT N/A 12/05/2013   Procedure: AORTIC VALVE REPLACEMENT (AVR);  Surgeon: Patrick Alberts, MD;  Location: [place];  Service: Open Heart Surgery;  Laterality: N/A;  . HAND SURGERY Left 07/1977  . INTRAOPERATIVE TRANSESOPHAGEAL ECHOCARDIOGRAM N/A 12/05/2013   Procedure: INTRAOPERATIVE TRANSESOPHAGEAL ECHOCARDIOGRAM;  Surgeon: Patrick Alberts, MD;  Location: [place];  Service: Open Heart Surgery;  Laterality: N/A;  . LEFT AND RIGHT HEART CATHETERIZATION WITH CORONARY ANGIOGRAM N/A 11/08/2013   Procedure: LEFT AND RIGHT HEART CATHETERIZATION WITH CORONARY ANGIOGRAM;  Surgeon: Patrick Ohara, MD;  Location: [facility] CATH LAB;  Service: Cardiovascular;  Laterality: N/A;  . PATENT FORAMEN OVALE CLOSURE N/A 12/05/2013   Procedure: PATENT FORAMEN OVALE CLOSURE;  Surgeon: Patrick Alberts, MD;  Location: [place];  Service: Open Heart Surgery;  Laterality: N/A;  . TEE WITHOUT CARDIOVERSION N/A 10/23/2013   Procedure: TRANSESOPHAGEAL ECHOCARDIOGRAM (TEE);  Surgeon: Patrick Dresser, MD;  Location: [place];  Service: Cardiovascular;  Laterality: N/A;  . TEE WITHOUT CARDIOVERSION N/A 01/19/2018   Procedure: TRANSESOPHAGEAL ECHOCARDIOGRAM (TEE);  Surgeon: Patrick Klein, MD;  Location: [facility] ENDOSCOPY;  Service: Cardiovascular;  Laterality: N/A;  . VASECTOMY  06/2002    Family History  Problem Relation Age of Onset  . Diabetes Mother   . Cancer Mother        colon  . Diabetes Father   . Cancer Father        colon  . Colon cancer Father 23  . Diabetes Paternal Grandmother   . Cancer Paternal Grandmother   . Colon cancer Paternal Grandmother 68  . Cancer Daughter        leukemia  . Heart attack Maternal  Grandmother   . Hypertension Maternal Grandmother   . Diabetes Maternal Grandmother   . Cancer Maternal Grandmother   . Diabetes Maternal Grandfather   . Cancer Maternal Grandfather   . Stroke Paternal Grandfather   . Diabetes Paternal Grandfather   . Cancer Paternal Grandfather     Social History   Socioeconomic History  . Marital status: Married    Spouse name: Not on file  . Number of children: 5  . Years of education: Not on file  . Highest education level: Not on file  Occupational History  . Occupation: Scientist, forensic: [employer]  . Financial resource strain: Not on file  . Food insecurity:    Worry: Not on file    Inability: Not on file  . Transportation needs:    Medical: Not on file    Non-medical: Not on file  Tobacco Use  . Smoking status: Never Smoker  . Smokeless tobacco: Never Used  Substance and Sexual Activity  .  Alcohol use: Yes    Alcohol/week: 0.0 standard drinks    Comment: occ  . Drug use: No  . Sexual activity: Not on file  Lifestyle  . Physical activity:    Days per week: Not on file    Minutes per session: Not on file  . Stress: Not on file  Relationships  . Social connections:    Talks on phone: Not on file    Gets together: Not on file    Attends religious service: Not on file    Active member of club or organization: Not on file    Attends meetings of clubs or organizations: Not on file    Relationship status: Not on file  . Intimate partner violence:    Fear of current or ex partner: Not on file    Emotionally abused: Not on file    Physically abused: Not on file    Forced sexual activity: Not on file  Other Topics Concern  . Not on file  Social History Narrative   The patient is married. He is originally from [place]. He works as an Art gallery manager. He does not smoke cigarettes or drink alcohol.    Prior to Admission medications   Medication Sig Start Date End Date Taking? Authorizing  Provider  amoxicillin (AMOXIL) 500 MG tablet Take 4 tablets (2,000 mg) one hour prior to dental appointments. 11/16/17  Yes Patrick Mocha, MD  aspirin 81 MG EC tablet Take 1 tablet (81 mg total) by mouth daily. 05/28/14  Yes Patrick Mocha, MD  Boswellia-Glucosamine-Vit D (GLUCOSAMINE COMPLEX PO) Take 1 capsule by mouth daily.   Yes [provider]  celecoxib (CELEBREX) 200 MG capsule TAKE 1 TO 2 CAPSULES BY MOUTH EVERY DAY AS NEEDED FOR PAIN Patient taking differently: Take 200-400 mg by mouth daily as needed for mild pain.  10/31/17  Yes [name], MD  Coenzyme Q10 300 MG CAPS Take 1 capsule by mouth daily.   Yes [provider]  Multiple Vitamin (MULTIVITAMIN WITH MINERALS) TABS tablet Take 1 tablet by mouth daily.   Yes [provider]  Omega 3 1000 MG CAPS Take 3 capsules by mouth daily.   Yes [provider]  Probiotic Product (PROBIOTIC PO) Take 1 capsule by mouth daily.   Yes [provider]  TURMERIC PO Take 1 capsule by mouth daily.   Yes [provider]  triamcinolone cream (KENALOG) 0.1 % Apply 1 application topically 2 (two) times daily. Patient not taking: Reported on 01/16/2018 06/10/17   [name], MD    Current Facility-Administered Medications  Medication Dose Route Frequency Provider Last Rate Last Dose  . 0.9 %  sodium chloride infusion   Intravenous PRN Croitoru, Mihai, MD 10 mL/hr at 01/19/18 0612 250 mL at 01/19/18 0612  . acetaminophen (TYLENOL) tablet 650 mg  650 mg Oral Q6H PRN Croitoru, Mihai, MD   650 mg at 01/20/18 0320   Or  . acetaminophen (TYLENOL) suppository 650 mg  650 mg Rectal Q6H PRN Croitoru, Mihai, MD      . bisacodyl (DULCOLAX) EC tablet 5 mg  5 mg Oral Daily PRN Cristal Ford, DO      . ceFAZolin (ANCEF) IVPB 2g/100 mL premix  2 g Intravenous Q8H Croitoru, Mihai, MD 200 mL/hr at 01/20/18 1050 2 g at 01/20/18 1050  . enoxaparin (LOVENOX) injection 40 mg  40 mg Subcutaneous  QHS Croitoru, Mihai, MD   40 mg at 01/16/18 0154  . gentamicin (GARAMYCIN)  IVPB 80 mg  80 mg Intravenous Q8H Thayer Headings, MD      . ondansetron [facility]) tablet 4 mg  4 mg Oral Q6H PRN Croitoru, Mihai, MD   4 mg at 01/19/18 2114   Or  . ondansetron (ZOFRAN) injection 4 mg  4 mg Intravenous Q6H PRN Croitoru, Mihai, MD   4 mg at 01/19/18 1132  . rifampin (RIFADIN) capsule 300 mg  300 mg Oral TID WC Susa Raring, RPH   300 mg at 01/20/18 1222    No Known Allergies    Review of Systems:   General:  decreased appetite, decreased energy, no weight gain, + weight loss, + fever  Cardiac:  no chest pain with exertion, no chest pain at rest, no SOB with exertion, no resting SOB, no PND, no orthopnea, no palpitations, no arrhythmia, no atrial fibrillation, no LE edema, no dizzy spells, no syncope  Respiratory:  no shortness of breath, no home oxygen, no productive cough, no dry cough, no bronchitis, no wheezing, no hemoptysis, no asthma, no pain with inspiration or cough, no sleep apnea, no CPAP at night  GI:   no difficulty swallowing, no reflux, no frequent heartburn, no hiatal hernia, no abdominal pain, no constipation, no diarrhea, no hematochezia, no hematemesis, no melena  GU:   no dysuria,  no frequency, no urinary tract infection, no hematuria, no enlarged prostate, no kidney stones, no kidney disease  Vascular:  no pain suggestive of claudication, no pain in feet, no leg cramps, no varicose veins, no DVT, no non-healing foot ulcer  Neuro:   no stroke, no TIA's, no seizures, no headaches, no temporary blindness one eye,  no slurred speech, no peripheral neuropathy, no chronic pain, no instability of gait, no memory/cognitive dysfunction  Musculoskeletal: + arthritis - primarily involving the left hip, no joint swelling, + myalgias, no difficulty walking, normal mobility   Skin:   no rash, no itching, + skin infections, no pressure sores or ulcerations  Psych:   no anxiety, no depression,  no nervousness, no unusual recent stress  Eyes:   no blurry vision, no floaters, no recent vision changes, + wears glasses or contacts  ENT:   no hearing loss, no loose or painful teeth, no dentures, last saw dentist within the past 3 months  Hematologic:  no easy bruising, no abnormal bleeding, no clotting disorder, no frequent epistaxis  Endocrine:  no diabetes, does not check CBG's at home     Physical Exam:   BP 106/77 (BP Location: Left Arm)   Pulse 77   Temp 98.6 F (37 C) (Oral)   Resp 18   Ht 5\' 11"  (1.803 m)   Wt 80.7 kg   SpO2 96%   BMI 24.83 kg/m   General:    well-appearing  HEENT:  Unremarkable   Neck:   no JVD, no bruits, no adenopathy   Chest:   clear to auscultation, symmetrical breath sounds, no wheezes, no rhonchi   CV:   RRR, no murmur   Abdomen:  soft, non-tender, no masses   Extremities:  warm, well-perfused, pulses palpable, no lower extremity edema  Rectal/GU  + small area of skin breakdown at apex of gluteal crease c/w possible pilonidal cyst  Neuro:   Grossly non-focal and symmetrical throughout  Skin:   Clean and dry, no rashes, no breakdown  Diagnostic Tests:  Lab Results: Recent Labs    01/18/18 0553 01/20/18 0330  WBC 14.8* 14.7*  HGB 8.8* 9.5*  HCT  29.2* 32.3*  PLT 251 356   BMET:  Recent Labs    01/19/18 0603 01/20/18 0330  NA 138 139  K 4.3 4.4  CL 104 105  CO2 26 28  GLUCOSE 106* 117*  BUN 12 10  CREATININE 0.82 1.03  CALCIUM 10.4* 10.4*    CBG (last 3)  No results for input(s): GLUCAP in the last 72 hours. PT/INR:  No results for input(s): LABPROT, INR in the last 72 hours.  CXR:  CHEST - 2 VIEW  COMPARISON:  01/07/2014  FINDINGS: Slight ectasia of the thoracic aorta. Median sternotomy sutures are redemonstrated. Normal sized heart is noted. Clear lungs without pulmonary consolidation, effusion or pneumothorax. No overt pulmonary edema. No acute osseous abnormality.  IMPRESSION: No active cardiopulmonary  disease.   Electronically Signed   By: Ashley Royalty M.D.   On: 01/15/2018 20:12    Transesophageal Echocardiography  Patient:    Patrick Walter, Patrick Walter MR #:       [id number] Study Date: 01/19/2018 Gender:     M Age:        43 Height: Weight: BSA: Pt. Status: Room:   [place], [place]  SONOGRAPHER  Dustin Flock, RCS  PERFORMING   Patrick Klein, MD  ADMITTING    [place] [id number]  Estell Harpin, [place], Tami Lin  cc:  ------------------------------------------------------------------- LV EF: 60% -   65%  ------------------------------------------------------------------- Indications:      Bacteremia 790.7.  ------------------------------------------------------------------- History:   PMH:   Aortic valve disease.  Bacteremia. Complications of prior valve surgery.  ------------------------------------------------------------------- Study Conclusions  - Left ventricle: The cavity size was normal. There was mild   concentric hypertrophy. Systolic function was normal. The   estimated ejection fraction was in the range of 60% to 65%. Wall   motion was normal; there were no regional wall motion   abnormalities. - Aortic valve: There was a large, 2.8 cm (L) x 1.2 cm (W) x 0.8   cm, mobile vegetation on the left ventricular aspect of the   anterior bioprosthetic cusp. There was mild regurgitation. - Ascending aorta: The ascending aorta was mildly dilated. - Mitral valve: No evidence of vegetation. There was mild   regurgitation. - Left atrium: The atrium was mildly dilated. No evidence of   thrombus in the atrial cavity or appendage. No spontaneous echo   contrast was observed. - Right atrium: No evidence of thrombus in the atrial cavity or   appendage. - Atrial septum: No defect or patent foramen ovale was identified. - Tricuspid valve: No evidence of vegetation. - Pulmonic valve: No evidence of  vegetation.  ------------------------------------------------------------------- Study data:   Study status:  Routine.  Consent:  The risks, benefits, and alternatives to the procedure were explained to the patient and informed consent was obtained.  Procedure:  The patient reported no pain pre or post test. Initial setup. The patient was brought to the laboratory. Surface ECG leads were monitored. Sedation. Conscious sedation was administered by cardiology staff. Transesophageal echocardiography. Topical anesthesia was obtained using viscous lidocaine. A transesophageal probe was inserted by the attending cardiologistwithout difficulty. Image quality was adequate.  Study completion:  The patient tolerated the procedure well. There were no complications.  Administered medications: Midazolam, 5mg , IV.  Fentanyl, 63mcg, IV.          Diagnostic transesophageal echocardiography.  2D and color Doppler. Birthdate:  Patient birthdate: 11-21-1955.  Age:  Patient is  62 yr old.  Sex:  Gender: male.  Blood pressure:     157/76  Patient status:  Inpatient.  Study date:  Study date: 01/19/2018. Study time: 09:42 AM.  Location:  Endoscopy.  -------------------------------------------------------------------  ------------------------------------------------------------------- Left ventricle:  The cavity size was normal. There was mild concentric hypertrophy. Systolic function was normal. The estimated ejection fraction was in the range of 60% to 65%. Wall motion was normal; there were no regional wall motion abnormalities.  ------------------------------------------------------------------- Aortic valve:  Well seated aortic valve bioprosthesis. There is no evidence of annular abscess or perivalvular leak. There was a large, 2.8 cm (L) x 1.2 cm (W) x 0.8 cm, mobile vegetation on the left ventricular aspect of the anterior bioprosthetic cusp. Doppler:   There was no stenosis.   There was mild  regurgitation.   ------------------------------------------------------------------- Aorta:  The aorta was normal, not dilated, and non-diseased. Ascending aorta: The ascending aorta was mildly dilated.  ------------------------------------------------------------------- Mitral valve:   Structurally normal valve.   Leaflet separation was normal.  No evidence of vegetation.  Doppler:  There was mild regurgitation.  ------------------------------------------------------------------- Left atrium:  The atrium was mildly dilated.  No evidence of thrombus in the atrial cavity or appendage. No spontaneous echo contrast was observed.  Emptying velocity was normal.  ------------------------------------------------------------------- Atrial septum:  No defect or patent foramen ovale was identified.   ------------------------------------------------------------------- Right ventricle:  The cavity size was normal. Systolic function was normal.  ------------------------------------------------------------------- Pulmonic valve:    Structurally normal valve.   Cusp separation was normal.  No evidence of vegetation.  Doppler:  There was trivial regurgitation.  ------------------------------------------------------------------- Tricuspid valve:   Structurally normal valve.   Leaflet separation was normal.  No evidence of vegetation.  Doppler:  There was trivial regurgitation.  ------------------------------------------------------------------- Right atrium:  The atrium was normal in size.  No evidence of thrombus in the atrial cavity or appendage.  ------------------------------------------------------------------- Pericardium:  There was no pericardial effusion.   ------------------------------------------------------------------- Post procedure conclusions Ascending Aorta:  - The aorta was normal, not dilated, and  non-diseased.  ------------------------------------------------------------------- Measurements   Aorta                                Value    Reference  Aortic root ID, M-L, ED              37.81 mm ---------  Ascending aorta ID, A-P, mid, ED (H) 40.99 mm 21 - 34  Legend: (L)  and  (H)  mark values outside specified reference range.  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  He presents with a 4 to 6-week history of recurrent fevers and generalized malaise.  There are no clinical signs of embolization nor congestive heart failure.  He has remained afebrile and white blood count is trending down since he was admitted to the hospital and started on intravenous antibiotics.  Although the original portal of entry remains unclear, the patient may have a pilonidal cyst associated with recurrent skin infections.  I have personally reviewed the patient's transesophageal echocardiogram which demonstrates bulky vegetations involving both the aortic and ventricular surface of all 3 leaflets of the patient's bioprosthetic tissue valve in the aortic position.  There is no sign of annular abscess formation and there is trivial aortic insufficiency.  However, there already appears to be some degree of  prolapse of 1 of the 3 leaflets.  Due to the extensive bulky vegetations the patient would appear to be at significant risk for embolization.    Plan:  I have discussed treatment options with the patient at length including continued medical therapy with standard 6-week course of intravenous antibiotics with or without redo aortic valve replacement.  Absolute and relative indications for surgery were discussed.  The need to eradicate any potential source for recurrent bacteremia was emphasized.   All the patient's questions have been answered.  He desires to proceed with redo aortic valve replacement at some point in the near future in an effort to eliminate his risk of embolization and completely eradicate the underlying infection.  As a next step the patient will undergo MRI of the brain and CT angiography to rule out the presence of subclinical embolization.  We will ask for the patient to be evaluated by general surgery to consider surgical treatment for possible pilonidal cyst.  Will discuss w/ Dr. Burt Knack and cardiology team whether or not to consider coronary CTA.  He had mild non-obstructive CAD on cath in 2015.  I don't think he needs diagnostic cardiac catheterization.   I spent in excess of 90 minutes during the conduct of this hospital consultation and >50% of this time involved direct face-to-face encounter for counseling and/or coordination of the patient's care.    Valentina Gu. Roxy Manns, MD 01/20/2018 1:53 PM    Addendum:  Discussed with Dr. Burt Knack who favors getting coronary CT angiogram to r/o possible progression of CAD.  Patrick Alberts, MD 01/20/2018 3:10 PM

## 2018-01-20 NOTE — Progress Notes (Signed)
PROGRESS NOTE    Patrick Walter  HMC:[id number] DOB: 09/28/1955 DOA: 01/15/2018 PCP: [name], MD   Brief Narrative:  HPI on 01/16/2018 by Dr. Gean Birchwood Patrick Walter is a 62 y.o. male with history of bicuspid aortic valve status post bioprosthetic valve replacement in 2015 and at that time patient also has had PFO closure also receives injection for right hip osteoarthritis last one was last month has had recently traveled to [place] last week for sports event presents to the ER with complaints of having fever and chills with generalized body ache for the last 2 days.  Denies any chest pain headache visual symptoms sore throat nausea vomiting diarrhea or productive cough or shortness of breath.  Since patient has been having persistent symptoms last 2 days patient comes to the ER.  Interim history Patient found to have sepsis secondary to methicillin susceptible Staphylococcus epidermidis.  TEE revealed large vegetation on prosthetic aortic valve.  Cardiology as well as cardiothoracic surgery consulted and following. Assessment & Plan   Sepsis secondary to methicillin susceptible Staphylococcus epidermidis/prosthetic valve endocarditis -Patient had a history of dental work in August for which she took Amoxil.  Also had steroid injection in the hip approximately 1 month ago. -Influenza PCR as well as respiratory panel as well as strep throat were negative -HIV nonreactive -ID consulted and appreciated, patient was initially on Rocephin however transition to Ancef on 01/17/2018 -Patient [facility] -TEE showed large vegetation on the prosthetic aortic valve -Blood cultures from 01/17/2018 show no growth to date -Cardiothoracic surgery consulted and appreciated, MRI of the brain as well as CT angiography ordered to rule out presence of subclinical embolization.  General surgery consulted for possible pilonidal  cyst.  Left hip osteoarthritis -Celebrex on hold  Normocytic anemia  Pain foramen ovale/a sending aortic enlargement/moderate diastolic CHF -Noted on echocardiogram in 2018, per TEE, EF 60% with mild MR and AI -Stable  Pilonidal cyst -As above General surgery consulted and appreciated  DVT Prophylaxis  Lovenox  Code Status: Full  Family Communication: None at bedside  Disposition Plan: Admitted. Pending recommendations from cardiothoracic surgery.  Consultants Infectious disease Cardiology Cardiothoracic surgery General surgery  Procedures  TEE  Antibiotics   Anti-infectives (From admission, onward)   Start     Dose/Rate Route Frequency Ordered Stop   01/20/18 1900  gentamicin (GARAMYCIN) IVPB 80 mg     80 mg 100 mL/hr over 30 Minutes Intravenous Every 8 hours 01/20/18 0945     01/20/18 1400  rifampin (RIFADIN) capsule 300 mg  Status:  Discontinued     300 mg Oral Every 8 hours 01/20/18 0945 01/20/18 1000   01/20/18 1200  rifampin (RIFADIN) capsule 300 mg     300 mg Oral 3 times daily with meals 01/20/18 1000     01/20/18 1100  gentamicin (GARAMYCIN) IVPB 120 mg     120 mg 200 mL/hr over 30 Minutes Intravenous  Once 01/20/18 0945 01/20/18 1251   01/17/18 1400  ceFAZolin (ANCEF) IVPB 2g/100 mL premix     2 g 200 mL/hr over 30 Minutes Intravenous Every 8 hours 01/17/18 1156     01/17/18 1030  cefTRIAXone (ROCEPHIN) 2 g in sodium chloride 0.9 % 100 mL IVPB  Status:  Discontinued     2 g 200 mL/hr over 30 Minutes Intravenous Every 24 hours 01/17/18 0919 01/17/18 1156   01/16/18 0800  vancomycin (VANCOCIN) 1,750 mg in sodium chloride 0.9 %  500 mL IVPB  Status:  Discontinued     1,750 mg 250 mL/hr over 120 Minutes Intravenous Every 24 hours 01/16/18 0136 01/17/18 0919   01/16/18 0600  piperacillin-tazobactam (ZOSYN) IVPB 3.375 g  Status:  Discontinued     3.375 g 12.5 mL/hr over 240 Minutes Intravenous Every 8 hours 01/16/18 0136 01/17/18 0918   01/15/18 2130   piperacillin-tazobactam (ZOSYN) IVPB 3.375 g     3.375 g 100 mL/hr over 30 Minutes Intravenous  Once 01/15/18 2123 01/15/18 2211   01/15/18 2130  vancomycin (VANCOCIN) IVPB 1000 mg/200 mL premix     1,000 mg 200 mL/hr over 60 Minutes Intravenous  Once 01/15/18 2123 01/15/18 2313   01/15/18 2130  oseltamivir (TAMIFLU) capsule 75 mg     75 mg Oral  Once 01/15/18 2123 01/15/18 2134      Subjective:   Patrick Walter seen and examined today.  Patient has no complaints this morning.  Denies chest pain, shortness breath, abdominal pain, nausea or vomiting, diarrhea constipation, dizziness or headache. Objective:   Vitals:   01/20/18 0810 01/20/18 0900 01/20/18 1100 01/20/18 1225  BP: (!) 119/91   106/77  Pulse: 78 86 91 77  Resp: 20 18 (!) 22 18  Temp: 98.3 F (36.8 C)   98.6 F (37 C)  TempSrc: Oral   Oral  SpO2: 99% 98% 92% 96%  Weight:      Height:        Intake/Output Summary (Last 24 hours) at 01/20/2018 1419 Last data filed at 01/20/2018 0800 Gross per 24 hour  Intake 460 ml  Output -  Net 460 ml   Filed Weights   01/15/18 1923  Weight: 80.7 kg    Exam  General: Well developed, well nourished, NAD, appears stated age  68: NCAT, mucous membranes moist.   Neck: Supple  Cardiovascular: S1 S2 auscultated, 2/6 SEM  Respiratory: Clear to auscultation bilaterally with equal chest rise  Abdomen: Soft, nontender, nondistended, + bowel sounds  Extremities: warm dry without cyanosis clubbing or edema  Neuro: AAOx3, nonfocal  Psych: Normal affect and demeanor   Data Reviewed: I have personally reviewed following labs and imaging studies  CBC: Recent Labs  Lab 01/15/18 2019 01/16/18 0352 01/16/18 0735 01/17/18 0644 01/18/18 0553 01/20/18 0330  WBC 16.8* 16.0* 16.8* 15.5* 14.8* 14.7*  NEUTROABS 13.2* 12.1*  --  11.5*  --   --   HGB 10.4* 9.1* 8.9* 9.0* 8.8* 9.5*  HCT 33.7* 29.8* 28.9* 29.0* 29.2* 32.3*  MCV 86.4 87.4 87.6 87.3 87.2 88.0  PLT 210 177  176 196 251 299   Basic Metabolic Panel: Recent Labs  Lab 01/15/18 2019 01/16/18 0352 01/17/18 0644 01/18/18 0553 01/19/18 0603 01/20/18 0330  NA 137 139  --   --  138 139  K 4.1 4.3  --   --  4.3 4.4  CL 105 108  --   --  104 105  CO2 24 24  --   --  26 28  GLUCOSE 103* 114*  --   --  106* 117*  BUN 19 16  --   --  12 10  CREATININE 1.03 0.83 0.87 0.81 0.82 1.03  CALCIUM 9.5 9.0  --   --  10.4* 10.4*   GFR: Estimated Creatinine Clearance: 81.2 mL/min (by C-G formula based on SCr of 1.03 mg/dL). Liver Function Tests: Recent Labs  Lab 01/15/18 2019 01/16/18 0352 01/20/18 0330  AST 28 21 40  ALT 18 17 40  ALKPHOS 70 54 98  BILITOT 0.9 1.0 0.6  PROT 6.6 5.7* 6.3*  ALBUMIN 3.2* 2.7* 2.8*   No results for input(s): LIPASE, AMYLASE in the last 168 hours. No results for input(s): AMMONIA in the last 168 hours. Coagulation Profile: No results for input(s): INR, PROTIME in the last 168 hours. Cardiac Enzymes: No results for input(s): CKTOTAL, CKMB, CKMBINDEX, TROPONINI in the last 168 hours. BNP (last 3 results) No results for input(s): PROBNP in the last 8760 hours. HbA1C: No results for input(s): HGBA1C in the last 72 hours. CBG: No results for input(s): GLUCAP in the last 168 hours. Lipid Profile: No results for input(s): CHOL, HDL, LDLCALC, TRIG, CHOLHDL, LDLDIRECT in the last 72 hours. Thyroid Function Tests: No results for input(s): TSH, T4TOTAL, FREET4, T3FREE, THYROIDAB in the last 72 hours. Anemia Panel: No results for input(s): VITAMINB12, FOLATE, FERRITIN, TIBC, IRON, RETICCTPCT in the last 72 hours. Urine analysis:    Component Value Date/Time   COLORURINE YELLOW 01/15/2018 2214   APPEARANCEUR CLEAR 01/15/2018 2214   LABSPEC 1.010 01/15/2018 2214   PHURINE 7.0 01/15/2018 2214   GLUCOSEU NEGATIVE 01/15/2018 2214   HGBUR NEGATIVE 01/15/2018 2214   BILIRUBINUR NEGATIVE 01/15/2018 2214   KETONESUR NEGATIVE 01/15/2018 2214   PROTEINUR NEGATIVE 01/15/2018  2214   UROBILINOGEN 0.2 12/03/2013 1344   NITRITE NEGATIVE 01/15/2018 2214   LEUKOCYTESUR NEGATIVE 01/15/2018 2214   Sepsis Labs: @LABRCNTIP (procalcitonin:4,lacticidven:4)  ) Recent Results (from the past 240 hour(s))  Culture, blood (Routine X 2) w Reflex to ID Panel     Status: Abnormal   Collection Time: 01/15/18  8:15 PM  Result Value Ref Range Status   Specimen Description   Final    BLOOD RIGHT HAND Performed at [facility], [address] [place], [place] [address]    Special Requests   Final    BOTTLES DRAWN AEROBIC AND ANAEROBIC Blood Culture adequate volume Performed at [facility], [address] [place], [place] [address]    Culture  Setup Time   Final    IN BOTH AEROBIC AND ANAEROBIC BOTTLES GRAM POSITIVE COCCI CRITICAL VALUE NOTED.  VALUE IS CONSISTENT WITH PREVIOUSLY REPORTED AND CALLED VALUE.    Culture (A)  Final    STAPHYLOCOCCUS EPIDERMIDIS SUSCEPTIBILITIES PERFORMED ON PREVIOUS CULTURE WITHIN THE LAST 5 DAYS. Performed at [facility], [place] [address], [place], [place] [address]    Report Status 01/19/2018 FINAL  Final  Culture, blood (Routine X 2) w Reflex to ID Panel     Status: Abnormal   Collection Time: 01/15/18  8:15 PM  Result Value Ref Range Status   Specimen Description   Final    BLOOD RIGHT ARM Performed at [facility], [address] [place], [place] [address]    Special Requests   Final    BOTTLES DRAWN AEROBIC AND ANAEROBIC Blood Culture adequate volume Performed at [facility], [address] [place], [place] [address]    Culture  Setup Time   Final    GRAM POSITIVE COCCI IN BOTH AEROBIC AND ANAEROBIC BOTTLES CRITICAL RESULT CALLED TO, READ BACK BY AND VERIFIED WITH: [facility] 01/16/18 1937 JDW    Culture STAPHYLOCOCCUS EPIDERMIDIS (A)  Final   Report Status 01/18/2018 FINAL  Final   Organism ID, Bacteria STAPHYLOCOCCUS EPIDERMIDIS  Final      Susceptibility    Staphylococcus epidermidis - MIC*    CIPROFLOXACIN <=0.5 SENSITIVE Sensitive  ERYTHROMYCIN <=0.25 SENSITIVE Sensitive     GENTAMICIN <=0.5 SENSITIVE Sensitive     OXACILLIN <=0.25 SENSITIVE Sensitive     TETRACYCLINE <=1 SENSITIVE Sensitive     VANCOMYCIN 1 SENSITIVE Sensitive     TRIMETH/SULFA <=10 SENSITIVE Sensitive     CLINDAMYCIN <=0.25 SENSITIVE Sensitive     RIFAMPIN <=0.5 SENSITIVE Sensitive     Inducible Clindamycin NEGATIVE Sensitive     * STAPHYLOCOCCUS EPIDERMIDIS  Blood Culture ID Panel (Reflexed)     Status: Abnormal   Collection Time: 01/15/18  8:15 PM  Result Value Ref Range Status   Enterococcus species NOT DETECTED NOT DETECTED Final   Listeria monocytogenes NOT DETECTED NOT DETECTED Final   Staphylococcus species DETECTED (A) NOT DETECTED Final    Comment: Methicillin (oxacillin) susceptible coagulase negative staphylococcus. Possible blood culture contaminant (unless isolated from more than one blood culture draw or clinical case suggests pathogenicity). No antibiotic treatment is indicated for blood  culture contaminants. CRITICAL RESULT CALLED TO, READ BACK BY AND VERIFIED WITH: [facility] 01/16/18 1937 JDW    Staphylococcus aureus (BCID) NOT DETECTED NOT DETECTED Final   Methicillin resistance NOT DETECTED NOT DETECTED Final   Streptococcus species NOT DETECTED NOT DETECTED Final   Streptococcus agalactiae NOT DETECTED NOT DETECTED Final   Streptococcus pneumoniae NOT DETECTED NOT DETECTED Final   Streptococcus pyogenes NOT DETECTED NOT DETECTED Final   Acinetobacter baumannii NOT DETECTED NOT DETECTED Final   Enterobacteriaceae species NOT DETECTED NOT DETECTED Final   Enterobacter cloacae complex NOT DETECTED NOT DETECTED Final   Escherichia coli NOT DETECTED NOT DETECTED Final   Klebsiella oxytoca NOT DETECTED NOT DETECTED Final   Klebsiella pneumoniae NOT DETECTED NOT DETECTED Final   Proteus species NOT DETECTED NOT DETECTED Final   Serratia  marcescens NOT DETECTED NOT DETECTED Final   Haemophilus influenzae NOT DETECTED NOT DETECTED Final   Neisseria meningitidis NOT DETECTED NOT DETECTED Final   Pseudomonas aeruginosa NOT DETECTED NOT DETECTED Final   Candida albicans NOT DETECTED NOT DETECTED Final   Candida glabrata NOT DETECTED NOT DETECTED Final   Candida krusei NOT DETECTED NOT DETECTED Final   Candida parapsilosis NOT DETECTED NOT DETECTED Final   Candida tropicalis NOT DETECTED NOT DETECTED Final    Comment: Performed at [facility], [place]. [address] [place], [place] [address]  Group A Strep by PCR     Status: None   Collection Time: 01/15/18  8:19 PM  Result Value Ref Range Status   Group A Strep by PCR NOT DETECTED NOT DETECTED Final    Comment: Performed at [facility], [address] [place], [place] [address]  Respiratory Panel by PCR     Status: None   Collection Time: 01/16/18  7:52 AM  Result Value Ref Range Status   Adenovirus NOT DETECTED NOT DETECTED Final   Coronavirus 229E NOT DETECTED NOT DETECTED Final   Coronavirus HKU1 NOT DETECTED NOT DETECTED Final   Coronavirus NL63 NOT DETECTED NOT DETECTED Final   Coronavirus OC43 NOT DETECTED NOT DETECTED Final   Metapneumovirus NOT DETECTED NOT DETECTED Final   Rhinovirus / Enterovirus NOT DETECTED NOT DETECTED Final   Influenza A NOT DETECTED NOT DETECTED Final   Influenza B NOT DETECTED NOT DETECTED Final   Parainfluenza Virus 1 NOT DETECTED NOT DETECTED Final   Parainfluenza Virus 2 NOT DETECTED NOT DETECTED Final   Parainfluenza Virus 3 NOT DETECTED NOT DETECTED Final   Parainfluenza Virus 4 NOT DETECTED NOT  DETECTED Final   Respiratory Syncytial Virus NOT DETECTED NOT DETECTED Final   Bordetella pertussis NOT DETECTED NOT DETECTED Final   Chlamydophila pneumoniae NOT DETECTED NOT DETECTED Final   Mycoplasma pneumoniae NOT DETECTED NOT DETECTED Final  MRSA PCR Screening     Status: None   Collection Time: 01/16/18 11:08 AM    Result Value Ref Range Status   MRSA by PCR NEGATIVE NEGATIVE Final    Comment:        The GeneXpert MRSA Assay (FDA approved for NASAL specimens only), is one component of a comprehensive MRSA colonization surveillance program. It is not intended to diagnose MRSA infection nor to guide or monitor treatment for MRSA infections. Performed at [facility], [place] [address] [place], [place] [address]   Culture, blood (Routine X 2) w Reflex to ID Panel     Status: None (Preliminary result)   Collection Time: 01/17/18 12:57 PM  Result Value Ref Range Status   Specimen Description   Final    BLOOD LEFT ARM Performed at [place] [address] [place], [place] [address]    Special Requests   Final    BOTTLES DRAWN AEROBIC AND ANAEROBIC Blood Culture adequate volume   Culture   Final    NO GROWTH 3 DAYS Performed at [facility], [place] [address] [place], [place] [address]    Report Status PENDING  Incomplete      Radiology Studies: No results found.   Scheduled Meds: . enoxaparin (LOVENOX) injection  40 mg Subcutaneous QHS  . lidocaine  10 mL Intradermal Once  . rifampin  300 mg Oral TID WC   Continuous Infusions: . sodium chloride 250 mL (01/19/18 0612)  .  ceFAZolin (ANCEF) IV 2 g (01/20/18 1050)  . gentamicin       LOS: 5 days   Time Spent in minutes   30 minutes  Patrick Walter D.O. on 01/20/2018 at 2:19 PM  Between 7am to 7pm - Please see pager noted on [email or web address]  After 7pm go to [email or web address]  And look for the night coverage person covering for me after hours  [facility] Office  [phone]

## 2018-01-20 NOTE — Consult Note (Signed)
Patrick Walter Jul 13, 1955  [id number].    Requesting MD: Dr. Darylene Price Chief Complaint/Reason for Consult: chronic pilonidal sinus tract  HPI:  This is a 62 yo white male who underwent a tissue aortic valve replacement by Dr. Roxy Manns in 2015.  He has done well since then until about a month or so ago when he developed some fatigue and myalgias.  Ultimately, he was admitted recently due to some hypotension and fevers.  He was found to have staph epidermidis in his blood and endocarditis.  He has been evaluated for other causes and only source currently is a chronic pilonidal cyst tract.  He states this has bothered him intermittently for the last 3 years since he lost a lot weight.  It occasionally swells and then improves on its own.  He did have a hip steroid injection just prior to all of this starting as well.  We have been asked to see him to excise this cyst prior to surgical intervention.  ROS: ROS: Please see HPI, otherwise all other systems have been reviewed and are negative.   Family History  Problem Relation Age of Onset  . Diabetes Mother   . Cancer Mother        colon  . Diabetes Father   . Cancer Father        colon  . Colon cancer Father 58  . Diabetes Paternal Grandmother   . Cancer Paternal Grandmother   . Colon cancer Paternal Grandmother 82  . Cancer Daughter        leukemia  . Heart attack Maternal Grandmother   . Hypertension Maternal Grandmother   . Diabetes Maternal Grandmother   . Cancer Maternal Grandmother   . Diabetes Maternal Grandfather   . Cancer Maternal Grandfather   . Stroke Paternal Grandfather   . Diabetes Paternal Grandfather   . Cancer Paternal Grandfather     Past Medical History:  Diagnosis Date  . Aortic stenosis 07/24/2013  . Ascending aorta enlargement ([place])   . Bicuspid aortic valve   . Coronary artery disease   . Heart murmur   . Hx of echocardiogram    post AVR >> Echo (10/15):  Mild LVH, mod focal basal septal  hypertrophy, EF 55-60%, Gr 1 DD, AVR ok (mean 15 mmHg), dilated ascending Aorta (44 mm), mod LAE  . Hypertension   . Patent foramen ovale 10/23/2013   Closed at the time of aortic valve replacement  . S/P aortic valve replacement with bioprosthetic valve 12/05/2013   25 mm [facility] Ease bovine pericardial tissue valve     Past Surgical History:  Procedure Laterality Date  . AORTIC VALVE REPLACEMENT N/A 12/05/2013   Procedure: AORTIC VALVE REPLACEMENT (AVR);  Surgeon: [name], MD;  Location: [place];  Service: Open Heart Surgery;  Laterality: N/A;  . HAND SURGERY Left 07/1977  . INTRAOPERATIVE TRANSESOPHAGEAL ECHOCARDIOGRAM N/A 12/05/2013   Procedure: INTRAOPERATIVE TRANSESOPHAGEAL ECHOCARDIOGRAM;  Surgeon: [name], MD;  Location: [facility];  Service: Open Heart Surgery;  Laterality: N/A;  . LEFT AND RIGHT HEART CATHETERIZATION WITH CORONARY ANGIOGRAM N/A 11/08/2013   Procedure: LEFT AND RIGHT HEART CATHETERIZATION WITH CORONARY ANGIOGRAM;  Surgeon: Blane Ohara, MD;  Location[facility] CATH LAB;  Service: Cardiovascular;  Laterality: N/A;  . PATENT FORAMEN OVALE CLOSURE N/A 12/05/2013   Procedure: PATENT FORAMEN OVALE CLOSURE;  Surgeon: [name], MD;  Location: [facility];  Service: Open Heart Surgery;  Laterality: N/A;  . TEE WITHOUT  CARDIOVERSION N/A 10/23/2013   Procedure: TRANSESOPHAGEAL ECHOCARDIOGRAM (TEE);  Surgeon: Larey Dresser, MD;  Location: [place];  Service: Cardiovascular;  Laterality: N/A;  . TEE WITHOUT CARDIOVERSION N/A 01/19/2018   Procedure: TRANSESOPHAGEAL ECHOCARDIOGRAM (TEE);  Surgeon: Sanda Klein, MD;  Location: [facility] ENDOSCOPY;  Service: Cardiovascular;  Laterality: N/A;  . VASECTOMY  06/2002    Social History:  reports that he has never smoked. He has never used smokeless tobacco. He reports that he drinks alcohol. He reports that he does not use drugs.  Allergies: No Known Allergies  Medications Prior to Admission  Medication Sig Dispense Refill    . amoxicillin (AMOXIL) 500 MG tablet Take 4 tablets (2,000 mg) one hour prior to dental appointments. 8 tablet 6  . aspirin 81 MG EC tablet Take 1 tablet (81 mg total) by mouth daily.    . [place]-Glucosamine-Vit D (GLUCOSAMINE COMPLEX PO) Take 1 capsule by mouth daily.    . celecoxib (CELEBREX) 200 MG capsule TAKE 1 TO 2 CAPSULES BY MOUTH EVERY DAY AS NEEDED FOR PAIN (Patient taking differently: Take 200-400 mg by mouth daily as needed for mild pain. ) 60 capsule 2  . Coenzyme Q10 300 MG CAPS Take 1 capsule by mouth daily.    . Multiple Vitamin (MULTIVITAMIN WITH MINERALS) TABS tablet Take 1 tablet by mouth daily.    . Omega 3 1000 MG CAPS Take 3 capsules by mouth daily.    . Probiotic Product (PROBIOTIC PO) Take 1 capsule by mouth daily.    . TURMERIC PO Take 1 capsule by mouth daily.    Marland Kitchen triamcinolone cream (KENALOG) 0.1 % Apply 1 application topically 2 (two) times daily. (Patient not taking: Reported on 01/16/2018) 30 g 0     Physical Exam: Blood pressure 106/77, pulse 77, temperature 98.6 F (37 C), temperature source Oral, resp. rate 18, height _0  (1.803 m), weight 80.7 kg, SpO2 96 %. General: pleasant, WD, WN white male who is laying in bed in NAD HEENT: head is normocephalic, atraumatic.  Sclera are noninjected.  PERRL.  Ears and nose without any masses or lesions.  Mouth is pink and moist Heart: regular, rate, and rhythm.  Normal s1,s2. No obvious gallops, or rubs noted. +murmur.  Palpable radial and pedal pulses bilaterally Lungs: CTAB, no wheezes, rhonchi, or rales noted.  Respiratory effort nonlabored Abd: soft, NT, ND, +BS, no masses, hernias, or organomegaly Skin: warm and dry with no masses, lesions, or rashes.  Very small chronic pilonidal sinus tract.  No evidence of infection.  See procedure note for details regarding excision Psych: A&Ox3 with an appropriate affect.   Results for orders placed or performed during the hospital encounter of 01/15/18 (from the past  48 hour(s))  Basic metabolic panel     Status: Abnormal   Collection Time: 01/19/18  6:03 AM  Result Value Ref Range   Sodium 138 135 - 145 mmol/L   Potassium 4.3 3.5 - 5.1 mmol/L   Chloride 104 98 - 111 mmol/L   CO2 26 22 - 32 mmol/L   Glucose, Bld 106 (H) 70 - 99 mg/dL   BUN 12 6 - 20 mg/dL   Creatinine, Ser 0.82 0.61 - 1.24 mg/dL   Calcium 10.4 (H) 8.9 - 10.3 mg/dL   GFR calc non Af Amer >60 >60 mL/min   GFR calc Af Amer >60 >60 mL/min    Comment: (NOTE) The eGFR has been calculated using the CKD EPI equation. This calculation has not been validated in  all clinical situations. eGFR's persistently <60 mL/min signify possible Chronic Kidney Disease.    Anion gap 8 5 - 15    Comment: Performed at [name], [place] [address], [place], [place] [address]  Comprehensive metabolic panel     Status: Abnormal   Collection Time: 01/20/18  3:30 AM  Result Value Ref Range   Sodium 139 135 - 145 mmol/L   Potassium 4.4 3.5 - 5.1 mmol/L   Chloride 105 98 - 111 mmol/L   CO2 28 22 - 32 mmol/L   Glucose, Bld 117 (H) 70 - 99 mg/dL   BUN 10 6 - 20 mg/dL   Creatinine, Ser 1.03 0.61 - 1.24 mg/dL   Calcium 10.4 (H) 8.9 - 10.3 mg/dL   Total Protein 6.3 (L) 6.5 - 8.1 g/dL   Albumin 2.8 (L) 3.5 - 5.0 g/dL   AST 40 15 - 41 U/L   ALT 40 0 - 44 U/L   Alkaline Phosphatase 98 38 - 126 U/L   Total Bilirubin 0.6 0.3 - 1.2 mg/dL   GFR calc non Af Amer >60 >60 mL/min   GFR calc Af Amer >60 >60 mL/min    Comment: (NOTE) The eGFR has been calculated using the CKD EPI equation. This calculation has not been validated in all clinical situations. eGFR's persistently <60 mL/min signify possible Chronic Kidney Disease.    Anion gap 6 5 - 15    Comment: Performed at [place] [address], [place], [place] [address]  CBC     Status: Abnormal   Collection Time: 01/20/18  3:30 AM  Result Value Ref Range   WBC 14.7 (H) 4.0 - 10.5 K/uL   RBC 3.67 (L) 4.22 - 5.81 MIL/uL    Hemoglobin 9.5 (L) 13.0 - 17.0 g/dL   HCT 32.3 (L) 39.0 - 52.0 %   MCV 88.0 80.0 - 100.0 fL   MCH 25.9 (L) 26.0 - 34.0 pg   MCHC 29.4 (L) 30.0 - 36.0 g/dL   RDW 13.3 11.5 - 15.5 %   Platelets 356 150 - 400 K/uL   nRBC 0.0 0.0 - 0.2 %    Comment: Performed at [facility], [place] [address] [place],  [address]   No results found.    Assessment/Plan Chronic pilonidal sinus tract -this was excised at the bedside.  No evidence of infection. -dressing in place.  Will recheck this tomorrow.  Endocarditis  Staph bacteremia  Henreitta Cea, [facility] 01/20/2018, 3:37 PM Pager: [phone]

## 2018-01-20 NOTE — Progress Notes (Signed)
CARDIAC REHAB PHASE I   PRE:  Rate/Rhythm: 99 SR  BP:  Sitting: 105/74        SaO2: 99 RA  MODE:  Ambulation: 470 ft   POST:  Rate/Rhythm: 95 SR  BP:  Sitting: 102/84        SaO2: 100 RA  1400 - 1438  Pt ambulated independently for 470 ft. Tolerated well. Gait was strong. Pt reports that he has been walking independently a lot. PA was in room waiting for pt after walk.   Philis Kendall, MS 01/20/2018 2:31 PM

## 2018-01-20 NOTE — Consult Note (Addendum)
[address]       [place] [address]             [phone]        June Brosseau [place] Medical Record #[id number] Date of Birth: April 16, 1955  Referring: Croitoru/Infectious Disease Primary Care: [name], MD Primary Cardiologist:Michael Burt Knack, MD  Chief Complaint:    Chief Complaint  Patient presents with  . Generalized Body Aches  . Fever   History of Present Illness:      Mr. Mccathern is a 62 yo white male well known to [facility].  He is S/P Aortic Valve Replacement with a 25 mm Edwards Magna Ease Pericardial Tissue Valve and Closure of a Patent Foramen Ovale performed 12/05/2013.  His hospital stay was uncomplicated and he was discharged home on 12/09/2013.  He has done well since his surgery has routinely followed by Dr. Roxy Manns and Cardiology as scheduled.  He has lost 100 lbs and completed 2 different marathons since that time.  The patient has been in his normal state of health until September of this year.  He developed episodes of fever, malaise, and a general feeling of being unwell.  These episodes resolved without intervention.  He visited his son in college in [place] who was sick at the time and he felt this could be a viral illness he contracted during that visit.  The patient however presented to the ED in [facility] on  01/15/2018 at which time he complained of a 2 day history of fevers and body aches.  Workup in the ED revealed the patient to be hypotensive, febrile at 102 with leukocytosis, and anemic which was new.  Rapid strep and flu test were both negative.  The patient had received his flu shot in August.  UA and CXR was also obtained and was negative.  Blood cultures were obtained, he was placed on IV Antibiotics and Tamiflu and sent to [facility] for further care.  Upon arrival to [facility] the patient was stable, but felt to be developing sepsis.  The patient admitted to having his right hip injected about 1 month prior.   However, there was no clear source as to where his infection could be coming from.  Blood cultures ultimately came back positive for Staph Epidermidis.  Echocardiogram was obtained and confirmed the presence of a vegetation on the patient's aortic valve prosthesis.  Infectious disease consult has been placed for ABX recommendations.  [facility]CTS consult has been requested for surgical intervention.  Currently, the patient is asymptomatic denying chest pain, shortness of breath, and myalgias at this time.  He states he feels okay considering.  His last dental visit was in August and was without issues.  He does have a Pilonidal cyst which he states has been present for the past 3 years.  He states that the cyst will fill with pus off and on, but just usually goes away without issue.  He states it has never ruptured until this admission.  Current Activity/ Functional Status: Patient is independent with mobility/ambulation, transfers, ADL's, IADL's.   Zubrod Score: At the time of surgery this patient's most appropriate activity status/level should be described as: []     0    Normal activity, no symptoms []     1    Restricted in physical strenuous activity but ambulatory, able to do out light work []     2    Ambulatory and capable of self care, unable to do  work activities, up and about                 more than 50%  Of the time                            []     3    Only limited self care, in bed greater than 50% of waking hours []     4    Completely disabled, no self care, confined to bed or chair []     5    Moribund  Past Medical History:  Diagnosis Date  . Aortic stenosis 07/24/2013  . Ascending aorta enlargement ([place])   . Bicuspid aortic valve   . Coronary artery disease   . Heart murmur   . Hx of echocardiogram    post AVR >> Echo (10/15):  Mild LVH, mod focal basal septal hypertrophy, EF 55-60%, Gr 1 DD, AVR ok (mean 15 mmHg), dilated ascending Aorta (44 mm), mod LAE  . Hypertension   . Patent  foramen ovale 10/23/2013   Closed at the time of aortic valve replacement  . S/P aortic valve replacement with bioprosthetic valve 12/05/2013   25 mm [facility] Ease bovine pericardial tissue valve     Past Surgical History:  Procedure Laterality Date  . AORTIC VALVE REPLACEMENT N/A 12/05/2013   Procedure: AORTIC VALVE REPLACEMENT (AVR);  Surgeon: [name], MD;  Location: [place];  Service: Open Heart Surgery;  Laterality: N/A;  . HAND SURGERY Left 07/1977  . INTRAOPERATIVE TRANSESOPHAGEAL ECHOCARDIOGRAM N/A 12/05/2013   Procedure: INTRAOPERATIVE TRANSESOPHAGEAL ECHOCARDIOGRAM;  Surgeon: [name], MD;  Location: [place];  Service: Open Heart Surgery;  Laterality: N/A;  . LEFT AND RIGHT HEART CATHETERIZATION WITH CORONARY ANGIOGRAM N/A 11/08/2013   Procedure: LEFT AND RIGHT HEART CATHETERIZATION WITH CORONARY ANGIOGRAM;  Surgeon: Blane Ohara, MD;  Location: [facility] CATH LAB;  Service: Cardiovascular;  Laterality: N/A;  . PATENT FORAMEN OVALE CLOSURE N/A 12/05/2013   Procedure: PATENT FORAMEN OVALE CLOSURE;  Surgeon: [name], MD;  Location: [place];  Service: Open Heart Surgery;  Laterality: N/A;  . TEE WITHOUT CARDIOVERSION N/A 10/23/2013   Procedure: TRANSESOPHAGEAL ECHOCARDIOGRAM (TEE);  Surgeon: Larey Dresser, MD;  Location: [place];  Service: Cardiovascular;  Laterality: N/A;  . TEE WITHOUT CARDIOVERSION N/A 01/19/2018   Procedure: TRANSESOPHAGEAL ECHOCARDIOGRAM (TEE);  Surgeon: Sanda Klein, MD;  Location: [place];  Service: Cardiovascular;  Laterality: N/A;  . VASECTOMY  06/2002    Social History   Tobacco Use  Smoking Status Never Smoker  Smokeless Tobacco Never Used    Social History   Substance and Sexual Activity  Alcohol Use Yes  . Alcohol/week: 0.0 standard drinks   Comment: occ     No Known Allergies  Current Facility-Administered Medications  Medication Dose Route Frequency Provider Last Rate Last Dose  . 0.9 %  sodium chloride infusion    Intravenous PRN Croitoru, Mihai, MD 10 mL/hr at 01/19/18 0612 250 mL at 01/19/18 0612  . acetaminophen (TYLENOL) tablet 650 mg  650 mg Oral Q6H PRN Croitoru, Mihai, MD   650 mg at 01/20/18 0320   Or  . acetaminophen (TYLENOL) suppository 650 mg  650 mg Rectal Q6H PRN Croitoru, Mihai, MD      . bisacodyl (DULCOLAX) EC tablet 5 mg  5 mg Oral Daily PRN Cristal Ford, DO      . ceFAZolin (ANCEF) IVPB 2g/100 mL premix  2 g Intravenous Q8H Croitoru, Mihai, MD 200 mL/hr at 01/20/18 1050 2 g at 01/20/18 1050  . enoxaparin (LOVENOX) injection 40 mg  40 mg Subcutaneous QHS Croitoru, Mihai, MD   40 mg at 01/16/18 0154  . gentamicin (GARAMYCIN) IVPB 80 mg  80 mg Intravenous Q8H Comer, Okey Regal, MD      . ondansetron [facility]) tablet 4 mg  4 mg Oral Q6H PRN Croitoru, Mihai, MD   4 mg at 01/19/18 2114   Or  . ondansetron (ZOFRAN) injection 4 mg  4 mg Intravenous Q6H PRN Croitoru, Mihai, MD   4 mg at 01/19/18 1132  . rifampin (RIFADIN) capsule 300 mg  300 mg Oral TID WC Susa Raring, RPH   300 mg at 01/20/18 1222    Medications Prior to Admission  Medication Sig Dispense Refill Last Dose  . amoxicillin (AMOXIL) 500 MG tablet Take 4 tablets (2,000 mg) one hour prior to dental appointments. 8 tablet 6 Past Month at Unknown time  . aspirin 81 MG EC tablet Take 1 tablet (81 mg total) by mouth daily.   01/14/2018 at 0900  . [place]wellia-Glucosamine-Vit D (GLUCOSAMINE COMPLEX PO) Take 1 capsule by mouth daily.   01/14/2018 at 0900  . celecoxib (CELEBREX) 200 MG capsule TAKE 1 TO 2 CAPSULES BY MOUTH EVERY DAY AS NEEDED FOR PAIN (Patient taking differently: Take 200-400 mg by mouth daily as needed for mild pain. ) 60 capsule 2 Past Week at Unknown time  . Coenzyme Q10 300 MG CAPS Take 1 capsule by mouth daily.   01/13/2018 at 0900  . Multiple Vitamin (MULTIVITAMIN WITH MINERALS) TABS tablet Take 1 tablet by mouth daily.   01/13/2018 at 0900  . Omega 3 1000 MG CAPS Take 3 capsules by mouth daily.   01/13/2018 at  0900  . Probiotic Product (PROBIOTIC PO) Take 1 capsule by mouth daily.   01/13/2018 at 0900  . TURMERIC PO Take 1 capsule by mouth daily.   01/13/2018 at 0900  . triamcinolone cream (KENALOG) 0.1 % Apply 1 application topically 2 (two) times daily. (Patient not taking: Reported on 01/16/2018) 30 g 0 Not Taking at Unknown time    Family History  Problem Relation Age of Onset  . Diabetes Mother   . Cancer Mother        colon  . Diabetes Father   . Cancer Father        colon  . Colon cancer Father 38  . Diabetes Paternal Grandmother   . Cancer Paternal Grandmother   . Colon cancer Paternal Grandmother 55  . Cancer Daughter        leukemia  . Heart attack Maternal Grandmother   . Hypertension Maternal Grandmother   . Diabetes Maternal Grandmother   . Cancer Maternal Grandmother   . Diabetes Maternal Grandfather   . Cancer Maternal Grandfather   . Stroke Paternal Grandfather   . Diabetes Paternal Grandfather   . Cancer Paternal Grandfather      Review of Systems:   ROS     Cardiac Review of Systems: Y or  [    ]= no  Chest Pain [ N   ]  Resting SOB [ N  ] Exertional SOB  [N  ]  Orthopnea [  ]   Pedal Edema [   ]    Palpitations [  ] Syncope  Aqua.Slicker  ]   Presyncope [   ]  General Review of Systems: [Y] = yes [  ]=no  Constitional: recent weight change [ Y ]; anorexia [  ]; fatigue [ Y ]; nausea [  ]; night sweats [  ]; fever [ Y ]; or chills [  ]                                                               Dental: Last Dentist visit: August 2019  Eye : blurred vision Aqua.Slicker  ]; diplopia [   ]; vision changes Aqua.Slicker  ];  Amaurosis fugax[  ]; Resp: cough [ N ];  wheezing[N  ];  hemoptysis[  ]; shortness of breath[N  ]; paroxysmal nocturnal dyspnea[  ]; dyspnea on exertion[  ]; or orthopnea[  ];  GI:  gallstones[  ], vomiting[N  ];  dysphagia[  ]; melena[  ];  hematochezia [  ]; heartburn[  ];   Hx of  Colonoscopy[  ]; GU: kidney stones [  ]; hematuria[  ];   dysuria [  ];  nocturia[  ];   history of     obstruction [  ]; urinary frequency [  ]             Skin: rash, swelling[N  ];, hair loss[  ];  peripheral edema[ N ];  or itching[  ]; Musculosketetal: myalgias[  ];  joint swelling[  ];  joint erythema[  ];  joint pain[  ];  back pain[  ];  Heme/Lymph: bruising[  ];  bleeding[  ];  anemia[  ];  Neuro: TIA[  ];  headaches[ N ];  stroke[N ];  vertigo[  ];  seizures[  ];   paresthesias[  ];  difficulty walking[ N ];  Psych:depression[N]; anxiety[ N ];  Endocrine: diabetes[ N ];  thyroid dysfunction[ N ];  Physical Exam: BP 106/77 (BP Location: Left Arm)   Pulse 77   Temp 98.6 F (37 C) (Oral)   Resp 18   Ht 5\' 11"  (1.803 m)   Wt 80.7 kg   SpO2 96%   BMI 24.83 kg/m   General appearance: alert, cooperative and no distress Head: Normocephalic, without obvious abnormality, atraumatic Resp: clear to auscultation bilaterally Cardio: regular rate and rhythm GI: soft, non-tender; bowel sounds normal; no masses,  no organomegaly Extremities: extremities normal, atraumatic, no cyanosis or edema and Pilonidal cyst, gluteal cleft Neurologic: Grossly normal  Diagnostic Studies & Laboratory data:     Recent Radiology Findings:   No results found.   I have independently reviewed the above radiologic studies and discussed with the patient   Recent Lab Findings: Lab Results  Component Value Date   WBC 14.7 (H) 01/20/2018   HGB 9.5 (L) 01/20/2018   HCT 32.3 (L) 01/20/2018   PLT 356 01/20/2018   GLUCOSE 117 (H) 01/20/2018   CHOL 171 12/06/2017   TRIG 75 12/06/2017   HDL 48 12/06/2017   LDLCALC 107 (H) 12/06/2017   ALT 40 01/20/2018   AST 40 01/20/2018   NA 139 01/20/2018   K 4.4 01/20/2018   CL 105 01/20/2018   CREATININE 1.03 01/20/2018   BUN 10 01/20/2018   CO2 28 01/20/2018   TSH 2.94 12/06/2017   INR 1.31 12/05/2013   HGBA1C 5.4 12/06/2017    Assessment / Plan:      1. Prosthetic Valve Endocarditis- will  likely need surgical intervention in the near  future 2. Pilonidal Cyst- General surgery consult has been placed 3. Recent Hip Injection right hip- ? Is orthopedics consult indicated 4. ID- blood cultures positive for Staph Epidermidis, on ABX per ID recommendations, currently afebrile 5. Dispo- patient stable, Endocarditis on IV ABX, Dr. Roxy Manns will follow up with further recommendations  I  spent 60 minutes counseling the patient face to face.  Erin Barrett PA-C  01/20/2018 1:34 PM

## 2018-01-20 NOTE — Progress Notes (Signed)
[place] for Infectious Disease   Reason for visit: Follow up on bacteremia with Staph epidermidis and AV endocarditis  Interval History: No new complaints.  No associated diarrhea. WBC stable at 14.7. Afebrile.  Repeat blood culture ngtd from 10/29.  Some abdominal crampy pain Cefazolin Rifampin gentamicin  Physical Exam: Constitutional:  Vitals:   01/20/18 0318 01/20/18 0810  BP:  (!) 119/91  Pulse: 78 78  Resp:  20  Temp: 98.5 F (36.9 C) 98.3 F (36.8 C)  SpO2: 98% 99%   patient appears in NAD Respiratory: Normal respiratory effort; CTA B Cardiovascular: RRR GI: soft, nt, nd  Review of Systems: Constitutional: negative for fevers, chills and malaise Gastrointestinal: negative for nausea and diarrhea Integument/breast: negative for rash  Lab Results  Component Value Date   WBC 14.7 (H) 01/20/2018   HGB 9.5 (L) 01/20/2018   HCT 32.3 (L) 01/20/2018   MCV 88.0 01/20/2018   PLT 356 01/20/2018    Lab Results  Component Value Date   CREATININE 1.03 01/20/2018   BUN 10 01/20/2018   NA 139 01/20/2018   K 4.4 01/20/2018   CL 105 01/20/2018   CO2 28 01/20/2018    Lab Results  Component Value Date   ALT 40 01/20/2018   AST 40 01/20/2018   ALKPHOS 98 01/20/2018     Microbiology: Recent Results (from the past 240 hour(s))  Culture, blood (Routine X 2) w Reflex to ID Panel     Status: Abnormal   Collection Time: 01/15/18  8:15 PM  Result Value Ref Range Status   Specimen Description   Final    BLOOD RIGHT HAND Performed at [facility], [address] [place], [place] [address]    Special Requests   Final    BOTTLES DRAWN AEROBIC AND ANAEROBIC Blood Culture adequate volume Performed at [facility], [address] [place], [place] [address]    Culture  Setup Time   Final    IN BOTH AEROBIC AND ANAEROBIC BOTTLES GRAM POSITIVE COCCI CRITICAL VALUE NOTED.  VALUE IS CONSISTENT WITH PREVIOUSLY REPORTED AND CALLED VALUE.    Culture (A)  Final    STAPHYLOCOCCUS EPIDERMIDIS SUSCEPTIBILITIES PERFORMED ON PREVIOUS CULTURE WITHIN THE LAST 5 DAYS. Performed at [facility], [place] [address] [place], [place] [address]    Report Status 01/19/2018 FINAL  Final  Culture, blood (Routine X 2) w Reflex to ID Panel     Status: Abnormal   Collection Time: 01/15/18  8:15 PM  Result Value Ref Range Status   Specimen Description   Final    BLOOD RIGHT ARM Performed at [facility], [address] [place], [place] [address]    Special Requests   Final    BOTTLES DRAWN AEROBIC AND ANAEROBIC Blood Culture adequate volume Performed at [facility], [address] [place], [place] [address]    Culture  Setup Time   Final    GRAM POSITIVE COCCI IN BOTH AEROBIC AND ANAEROBIC BOTTLES CRITICAL RESULT CALLED TO, READ BACK BY AND VERIFIED WITH: Sheffield Slider [facility] 01/16/18 1937 JDW    Culture STAPHYLOCOCCUS EPIDERMIDIS (A)  Final   Report Status 01/18/2018 FINAL  Final   Organism ID, Bacteria STAPHYLOCOCCUS EPIDERMIDIS  Final      Susceptibility   Staphylococcus epidermidis - MIC*    CIPROFLOXACIN <=0.5 SENSITIVE Sensitive     ERYTHROMYCIN <=0.25 SENSITIVE Sensitive     GENTAMICIN <=0.5  SENSITIVE Sensitive     OXACILLIN <=0.25 SENSITIVE Sensitive     TETRACYCLINE <=1 SENSITIVE Sensitive     VANCOMYCIN 1 SENSITIVE Sensitive     TRIMETH/SULFA <=10 SENSITIVE Sensitive     CLINDAMYCIN <=0.25 SENSITIVE Sensitive     RIFAMPIN <=0.5 SENSITIVE Sensitive     Inducible Clindamycin NEGATIVE Sensitive     * STAPHYLOCOCCUS EPIDERMIDIS  Blood Culture ID Panel (Reflexed)     Status: Abnormal   Collection Time: 01/15/18  8:15 PM  Result Value Ref Range Status   Enterococcus species NOT DETECTED NOT DETECTED Final   Listeria monocytogenes NOT DETECTED NOT DETECTED Final   Staphylococcus species DETECTED (A) NOT DETECTED Final    Comment: Methicillin (oxacillin) susceptible coagulase negative staphylococcus.  Possible blood culture contaminant (unless isolated from more than one blood culture draw or clinical case suggests pathogenicity). No antibiotic treatment is indicated for blood  culture contaminants. CRITICAL RESULT CALLED TO, READ BACK BY AND VERIFIED WITH: Henry Russel [facility] 01/16/18 1937 JDW    Staphylococcus aureus (BCID) NOT DETECTED NOT DETECTED Final   Methicillin resistance NOT DETECTED NOT DETECTED Final   Streptococcus species NOT DETECTED NOT DETECTED Final   Streptococcus agalactiae NOT DETECTED NOT DETECTED Final   Streptococcus pneumoniae NOT DETECTED NOT DETECTED Final   Streptococcus pyogenes NOT DETECTED NOT DETECTED Final   Acinetobacter baumannii NOT DETECTED NOT DETECTED Final   Enterobacteriaceae species NOT DETECTED NOT DETECTED Final   Enterobacter cloacae complex NOT DETECTED NOT DETECTED Final   Escherichia coli NOT DETECTED NOT DETECTED Final   Klebsiella oxytoca NOT DETECTED NOT DETECTED Final   Klebsiella pneumoniae NOT DETECTED NOT DETECTED Final   Proteus species NOT DETECTED NOT DETECTED Final   Serratia marcescens NOT DETECTED NOT DETECTED Final   Haemophilus influenzae NOT DETECTED NOT DETECTED Final   Neisseria meningitidis NOT DETECTED NOT DETECTED Final   Pseudomonas aeruginosa NOT DETECTED NOT DETECTED Final   Candida albicans NOT DETECTED NOT DETECTED Final   Candida glabrata NOT DETECTED NOT DETECTED Final   Candida krusei NOT DETECTED NOT DETECTED Final   Candida parapsilosis NOT DETECTED NOT DETECTED Final   Candida tropicalis NOT DETECTED NOT DETECTED Final    Comment: Performed at [facility], [place] [place], [place] [address]  Group A Strep by PCR     Status: None   Collection Time: 01/15/18  8:19 PM  Result Value Ref Range Status   Group A Strep by PCR NOT DETECTED NOT DETECTED Final    Comment: Performed at [facility], [address] [place], [place] [address]  Respiratory Panel by PCR     Status: None    Collection Time: 01/16/18  7:52 AM  Result Value Ref Range Status   Adenovirus NOT DETECTED NOT DETECTED Final   Coronavirus 229E NOT DETECTED NOT DETECTED Final   Coronavirus HKU1 NOT DETECTED NOT DETECTED Final   Coronavirus NL63 NOT DETECTED NOT DETECTED Final   Coronavirus OC43 NOT DETECTED NOT DETECTED Final   Metapneumovirus NOT DETECTED NOT DETECTED Final   Rhinovirus / Enterovirus NOT DETECTED NOT DETECTED Final   Influenza A NOT DETECTED NOT DETECTED Final   Influenza B NOT DETECTED NOT DETECTED Final   Parainfluenza Virus 1 NOT DETECTED NOT DETECTED Final   Parainfluenza Virus 2 NOT DETECTED NOT DETECTED Final   Parainfluenza Virus 3 NOT DETECTED NOT DETECTED Final   Parainfluenza Virus 4 NOT DETECTED NOT DETECTED Final   Respiratory Syncytial Virus NOT DETECTED  NOT DETECTED Final   Bordetella pertussis NOT DETECTED NOT DETECTED Final   Chlamydophila pneumoniae NOT DETECTED NOT DETECTED Final   Mycoplasma pneumoniae NOT DETECTED NOT DETECTED Final  MRSA PCR Screening     Status: None   Collection Time: 01/16/18 11:08 AM  Result Value Ref Range Status   MRSA by PCR NEGATIVE NEGATIVE Final    Comment:        The GeneXpert MRSA Assay (FDA approved for NASAL specimens only), is one component of a comprehensive MRSA colonization surveillance program. It is not intended to diagnose MRSA infection nor to guide or monitor treatment for MRSA infections. Performed at [facility], [place] [address] [place], [place] [address]   Culture, blood (Routine X 2) w Reflex to ID Panel     Status: None (Preliminary result)   Collection Time: 01/17/18 12:57 PM  Result Value Ref Range Status   Specimen Description   Final    BLOOD LEFT ARM Performed at [facility] [address] [place], [place] [address]    Special Requests   Final    BOTTLES DRAWN AEROBIC AND ANAEROBIC Blood Culture adequate volume   Culture   Final    NO GROWTH 2  DAYS Performed at [facility], [place] [address], [place], [place] [address]    Report Status PENDING  Incomplete    Impression/Plan:  1. Bacteremia - MSSE.  On cefazolin with gentamicin and rifampin.    2.  AV endocarditis - on treatment as above.  Prosthetic valve.  Getting CVTS evaluation.  Valve previously placed by Dr. Roxy Manns.     3. Access - will need picc line at some point once repeat blood cultures show clearance.

## 2018-01-20 NOTE — Progress Notes (Signed)
Pharmacy Antibiotic Note  Patrick Walter is a 62 y.o. male admitted on 01/15/2018 with MSSE aortic valve endocarditis with prosthetic valve in place.    Pharmacy has been consulted for gentamicin dosing. WBC stable today at 14.7 and patient afebrile. Renal function has been mostly stable with a slight bump in creatinine today from 0.82 to 1.03. CrCl ~ 81 ml/min. Cardiology evaluating patient for replacement surgery.   Plan: Gentamicin 120 mg (~ 1.5 mg/kg) X 1 then 80 mg every 8 hours  Continue cefazolin 2 gm every 8 hours Add Rifampin 300 mg TID with meals Monitor SCr daily while on gentamicin  F/U peak and trough at steady state   Height: 5\' 11"  (180.3 cm) Weight: 178 lb (80.7 kg) IBW/kg (Calculated) : 75.3  Temp (24hrs), Avg:98.5 F (36.9 C), Min:98.1 F (36.7 C), Max:99.1 F (37.3 C)  Recent Labs  Lab 01/15/18 2024 01/16/18 0352 01/16/18 0735 01/17/18 0644 01/18/18 0553 01/19/18 0603 01/20/18 0330  WBC  --  16.0* 16.8* 15.5* 14.8*  --  14.7*  CREATININE  --  0.83  --  0.87 0.81 0.82 1.03  LATICACIDVEN 1.54  --   --   --   --   --   --     Estimated Creatinine Clearance: 81.2 mL/min (by C-G formula based on SCr of 1.03 mg/dL).    No Known Allergies  Antimicrobials this admission: 10/29 Ancef >> 11/1 Norva Karvonen >> 11/1 Rifampin>>    Microbiology results: 10/27  BCx: 4/4 MSSE 10/29 BCx: NGTD    Thank you for allowing pharmacy to be a part of this patient's care.  Susa Raring, PharmD, BCPS  Infectious Diseases Clinical Pharmacist Phone: [phone] 01/20/2018 10:03 AM

## 2018-01-21 ENCOUNTER — Inpatient Hospital Stay (HOSPITAL_COMMUNITY): Payer: 59

## 2018-01-21 ENCOUNTER — Encounter (HOSPITAL_COMMUNITY): Payer: Self-pay | Admitting: Thoracic Surgery (Cardiothoracic Vascular Surgery)

## 2018-01-21 DIAGNOSIS — I76 Septic arterial embolism: Secondary | ICD-10-CM | POA: Diagnosis present

## 2018-01-21 LAB — CREATININE, SERUM
Creatinine, Ser: 0.98 mg/dL (ref 0.61–1.24)
GFR calc Af Amer: >60 mL/min (ref >60)
GFR calc non Af Amer: >60 mL/min (ref >60)

## 2018-01-21 LAB — GENTAMICIN LEVEL, TROUGH: GENTAMICIN TR: 1.7 ug/mL (ref 0.5–2.0)

## 2018-01-21 MED ORDER — IOPAMIDOL (ISOVUE-370) INJECTION 76%
INTRAVENOUS | Status: AC
  Filled 2018-01-21: qty 100

## 2018-01-21 MED ORDER — BENZONATATE 100 MG PO CAPS
200.0000 mg | ORAL_CAPSULE | Freq: Three times a day (TID) | ORAL | Status: DC
  Administered 2018-01-21 – 2018-01-25 (×14): 200 mg via ORAL
  Filled 2018-01-21 (×14): qty 2

## 2018-01-21 MED ORDER — IOPAMIDOL (ISOVUE-370) INJECTION 76%
100.0000 mL | Freq: Once | INTRAVENOUS | Status: AC | PRN
  Administered 2018-01-21: 100 mL via INTRAVENOUS

## 2018-01-21 NOTE — Progress Notes (Addendum)
PROGRESS NOTE    Patrick Walter  OMV:[id number] DOB: 1955/09/12 DOA: 01/15/2018 PCP: [name], MD   Brief Narrative:  HPI on 01/16/2018 by Dr. Gean Birchwood Patrick Walter is a 62 y.o. male with history of bicuspid aortic valve status post bioprosthetic valve replacement in 2015 and at that time patient also has had PFO closure also receives injection for right hip osteoarthritis last one was last month has had recently traveled to [place] last week for sports event presents to the ER with complaints of having fever and chills with generalized body ache for the last 2 days.  Denies any chest pain headache visual symptoms sore throat nausea vomiting diarrhea or productive cough or shortness of breath.  Since patient has been having persistent symptoms last 2 days patient comes to the ER.  Interim history Patient found to have sepsis secondary to methicillin susceptible Staphylococcus epidermidis.  TEE revealed large vegetation on prosthetic aortic valve.  Cardiology as well as cardiothoracic surgery consulted and following. Assessment & Plan   Sepsis secondary to methicillin susceptible Staphylococcus epidermidis/prosthetic valve endocarditis -Patient had a history of dental work in August for which he took Amoxil.  Also had steroid injection in the hip approximately 1 month ago. -Influenza PCR as well as respiratory panel as well as strep throat were negative -HIV nonreactive -ID consulted and appreciated, patient was initially on Rocephin however transition to Ancef on 01/17/2018 -Patient was [facility] -TEE showed large vegetation on the prosthetic aortic valve -Blood cultures from 01/17/2018 show no growth to date -Cardiothoracic surgery consulted and appreciated, MRI of the brain as well as CT angiography ordered to rule out presence of subclinical embolization.  General surgery consulted for possible pilonidal  cyst. -MRI obtained: 2 tiny cortical infarct suspected in the left parietal lobe suspicious for sequela of septic emboli -Pending CTA  Septic emobli -Noted on MRI as above -Discussed with neurology, Dr. Leonel Ramsay, antithrombotics are not indicated -Speech therapy consulted -Currently patient does not need physical or occupational therapy, this may need to be readdressed after valve replacement/surgery  Left hip osteoarthritis -Celebrex on hold  Normocytic anemia -hemoglobin has been stable  -labs pending stable   Pain foramen ovale/a sending aortic enlargement/moderate diastolic CHF -Noted on echocardiogram in 2018, per TEE, EF 60% with mild MR and AI -Stable  Pilonidal cyst -As above General surgery consulted and appreciated -s/p excision of chronic pilonidal sinus tract -cleanse daily and PRN after bowel movements, apply dry gauze as needed -surgery signed off  DVT Prophylaxis  Lovenox  Code Status: Full  Family Communication: None at bedside  Disposition Plan: Admitted. Pending recommendations from cardiothoracic surgery.  Consultants Infectious disease Cardiology Cardiothoracic surgery General surgery Neurology, Dr. Leonel Ramsay, via phone  Procedures  TEE Excision of chronic pilonidal sinus tract  Antibiotics   Anti-infectives (From admission, onward)   Start     Dose/Rate Route Frequency Ordered Stop   01/20/18 1900  gentamicin (GARAMYCIN) IVPB 80 mg     80 mg 100 mL/hr over 30 Minutes Intravenous Every 8 hours 01/20/18 0945     01/20/18 1400  rifampin (RIFADIN) capsule 300 mg  Status:  Discontinued     300 mg Oral Every 8 hours 01/20/18 0945 01/20/18 1000   01/20/18 1200  rifampin (RIFADIN) capsule 300 mg     300 mg Oral 3 times daily with meals 01/20/18 1000     01/20/18 1100  gentamicin (GARAMYCIN) IVPB 120 mg  120 mg 200 mL/hr over 30 Minutes Intravenous  Once 01/20/18 0945 01/20/18 1251   01/17/18 1400  ceFAZolin (ANCEF) IVPB 2g/100 mL premix      2 g 200 mL/hr over 30 Minutes Intravenous Every 8 hours 01/17/18 1156     01/17/18 1030  cefTRIAXone (ROCEPHIN) 2 g in sodium chloride 0.9 % 100 mL IVPB  Status:  Discontinued     2 g 200 mL/hr over 30 Minutes Intravenous Every 24 hours 01/17/18 0919 01/17/18 1156   01/16/18 0800  vancomycin (VANCOCIN) 1,750 mg in sodium chloride 0.9 % 500 mL IVPB  Status:  Discontinued     1,750 mg 250 mL/hr over 120 Minutes Intravenous Every 24 hours 01/16/18 0136 01/17/18 0919   01/16/18 0600  piperacillin-tazobactam (ZOSYN) IVPB 3.375 g  Status:  Discontinued     3.375 g 12.5 mL/hr over 240 Minutes Intravenous Every 8 hours 01/16/18 0136 01/17/18 0918   01/15/18 2130  piperacillin-tazobactam (ZOSYN) IVPB 3.375 g     3.375 g 100 mL/hr over 30 Minutes Intravenous  Once 01/15/18 2123 01/15/18 2211   01/15/18 2130  vancomycin (VANCOCIN) IVPB 1000 mg/200 mL premix     1,000 mg 200 mL/hr over 60 Minutes Intravenous  Once 01/15/18 2123 01/15/18 2313   01/15/18 2130  oseltamivir (TAMIFLU) capsule 75 mg     75 mg Oral  Once 01/15/18 2123 01/15/18 2134      Subjective:   Patrick Walter seen and examined today.  Patient has no complaints at this time.  Denies current chest pain, shortness breath, abdominal pain, nausea or vomiting, diarrhea or constipation, dizziness or headache.    Objective:   Vitals:   01/21/18 0038 01/21/18 0343 01/21/18 0810 01/21/18 0811  BP: 109/67  (!) 120/106 111/81  Pulse: 72  86 86  Resp: 20  18 17   Temp: 98.6 F (37 C) 98.8 F (37.1 C)  98.1 F (36.7 C)  TempSrc: Oral Oral  Oral  SpO2: 97%  98% 99%  Weight:      Height:        Intake/Output Summary (Last 24 hours) at 01/21/2018 1023 Last data filed at 01/21/2018 0252 Gross per 24 hour  Intake 1110 ml  Output -  Net 1110 ml   Filed Weights   01/15/18 1923  Weight: 80.7 kg   Exam  General: Well developed, well nourished, NAD, appears stated age  33: NCAT, mucous membranes moist.   Neck:  Supple  Cardiovascular: S1 S2 auscultated, 2/6 SEM, RRR  Respiratory: Clear to auscultation bilaterally with equal chest rise  Abdomen: Soft, nontender, nondistended, + bowel sounds  Extremities: warm dry without cyanosis clubbing or edema  Neuro: AAOx3, nonfocal  Psych: Normal affect and demeanor, pleasant   Data Reviewed: I have personally reviewed following labs and imaging studies  CBC: Recent Labs  Lab 01/15/18 2019 01/16/18 0352 01/16/18 0735 01/17/18 0644 01/18/18 0553 01/20/18 0330  WBC 16.8* 16.0* 16.8* 15.5* 14.8* 14.7*  NEUTROABS 13.2* 12.1*  --  11.5*  --   --   HGB 10.4* 9.1* 8.9* 9.0* 8.8* 9.5*  HCT 33.7* 29.8* 28.9* 29.0* 29.2* 32.3*  MCV 86.4 87.4 87.6 87.3 87.2 88.0  PLT 210 177 176 196 251 409   Basic Metabolic Panel: Recent Labs  Lab 01/15/18 2019 01/16/18 0352 01/17/18 0644 01/18/18 0553 01/19/18 0603 01/20/18 0330 01/21/18 0432  NA 137 139  --   --  138 139  --   K 4.1 4.3  --   --  4.3 4.4  --   CL 105 108  --   --  104 105  --   CO2 24 24  --   --  26 28  --   GLUCOSE 103* 114*  --   --  106* 117*  --   BUN 19 16  --   --  12 10  --   CREATININE 1.03 0.83 0.87 0.81 0.82 1.03 0.98  CALCIUM 9.5 9.0  --   --  10.4* 10.4*  --    GFR: Estimated Creatinine Clearance: 85.4 mL/min (by C-G formula based on SCr of 0.98 mg/dL). Liver Function Tests: Recent Labs  Lab 01/15/18 2019 01/16/18 0352 01/20/18 0330  AST 28 21 40  ALT 18 17 40  ALKPHOS 70 54 98  BILITOT 0.9 1.0 0.6  PROT 6.6 5.7* 6.3*  ALBUMIN 3.2* 2.7* 2.8*   No results for input(s): LIPASE, AMYLASE in the last 168 hours. No results for input(s): AMMONIA in the last 168 hours. Coagulation Profile: No results for input(s): INR, PROTIME in the last 168 hours. Cardiac Enzymes: No results for input(s): CKTOTAL, CKMB, CKMBINDEX, TROPONINI in the last 168 hours. BNP (last 3 results) No results for input(s): PROBNP in the last 8760 hours. HbA1C: No results for input(s): HGBA1C  in the last 72 hours. CBG: No results for input(s): GLUCAP in the last 168 hours. Lipid Profile: No results for input(s): CHOL, HDL, LDLCALC, TRIG, CHOLHDL, LDLDIRECT in the last 72 hours. Thyroid Function Tests: No results for input(s): TSH, T4TOTAL, FREET4, T3FREE, THYROIDAB in the last 72 hours. Anemia Panel: No results for input(s): VITAMINB12, FOLATE, FERRITIN, TIBC, IRON, RETICCTPCT in the last 72 hours. Urine analysis:    Component Value Date/Time   COLORURINE YELLOW 01/15/2018 2214   APPEARANCEUR CLEAR 01/15/2018 2214   LABSPEC 1.010 01/15/2018 2214   PHURINE 7.0 01/15/2018 2214   GLUCOSEU NEGATIVE 01/15/2018 2214   HGBUR NEGATIVE 01/15/2018 2214   BILIRUBINUR NEGATIVE 01/15/2018 2214   KETONESUR NEGATIVE 01/15/2018 2214   PROTEINUR NEGATIVE 01/15/2018 2214   UROBILINOGEN 0.2 12/03/2013 1344   NITRITE NEGATIVE 01/15/2018 2214   LEUKOCYTESUR NEGATIVE 01/15/2018 2214   Sepsis Labs: @LABRCNTIP (procalcitonin:4,lacticidven:4)  ) Recent Results (from the past 240 hour(s))  Culture, blood (Routine X 2) w Reflex to ID Panel     Status: Abnormal   Collection Time: 01/15/18  8:15 PM  Result Value Ref Range Status   Specimen Description   Final    BLOOD RIGHT HAND Performed at [facility], [address] [place], [place] [address]    Special Requests   Final    BOTTLES DRAWN AEROBIC AND ANAEROBIC Blood Culture adequate volume Performed at [facility], [address] [place], [place] [address]    Culture  Setup Time   Final    IN BOTH AEROBIC AND ANAEROBIC BOTTLES GRAM POSITIVE COCCI CRITICAL VALUE NOTED.  VALUE IS CONSISTENT WITH PREVIOUSLY REPORTED AND CALLED VALUE.    Culture (A)  Final    STAPHYLOCOCCUS EPIDERMIDIS SUSCEPTIBILITIES PERFORMED ON PREVIOUS CULTURE WITHIN THE LAST 5 DAYS. Performed at [facility], [place] [address] [place], [place] [address]    Report Status 01/19/2018 FINAL  Final  Culture, blood (Routine X 2) w Reflex to  ID Panel     Status: Abnormal   Collection Time: 01/15/18  8:15 PM  Result Value Ref Range Status   Specimen Description   Final    BLOOD RIGHT ARM Performed at  [facility], [address] [place], [place] [address]    Special Requests   Final    BOTTLES DRAWN AEROBIC AND ANAEROBIC Blood Culture adequate volume Performed at [facility], [address] [place], [place] [address]    Culture  Setup Time   Final    GRAM POSITIVE COCCI IN BOTH AEROBIC AND ANAEROBIC BOTTLES CRITICAL RESULT CALLED TO, READ BACK BY AND VERIFIED WITH: Sheffield Slider [facility] 01/16/18 1937 JDW    Culture STAPHYLOCOCCUS EPIDERMIDIS (A)  Final   Report Status 01/18/2018 FINAL  Final   Organism ID, Bacteria STAPHYLOCOCCUS EPIDERMIDIS  Final      Susceptibility   Staphylococcus epidermidis - MIC*    CIPROFLOXACIN <=0.5 SENSITIVE Sensitive     ERYTHROMYCIN <=0.25 SENSITIVE Sensitive     GENTAMICIN <=0.5 SENSITIVE Sensitive     OXACILLIN <=0.25 SENSITIVE Sensitive     TETRACYCLINE <=1 SENSITIVE Sensitive     VANCOMYCIN 1 SENSITIVE Sensitive     TRIMETH/SULFA <=10 SENSITIVE Sensitive     CLINDAMYCIN <=0.25 SENSITIVE Sensitive     RIFAMPIN <=0.5 SENSITIVE Sensitive     Inducible Clindamycin NEGATIVE Sensitive     * STAPHYLOCOCCUS EPIDERMIDIS  Blood Culture ID Panel (Reflexed)     Status: Abnormal   Collection Time: 01/15/18  8:15 PM  Result Value Ref Range Status   Enterococcus species NOT DETECTED NOT DETECTED Final   Listeria monocytogenes NOT DETECTED NOT DETECTED Final   Staphylococcus species DETECTED (A) NOT DETECTED Final    Comment: Methicillin (oxacillin) susceptible coagulase negative staphylococcus. Possible blood culture contaminant (unless isolated from more than one blood culture draw or clinical case suggests pathogenicity). No antibiotic treatment is indicated for blood  culture contaminants. CRITICAL RESULT CALLED TO, READ BACK BY AND VERIFIED WITH: Henry Russel [facility]  01/16/18 1937 JDW    Staphylococcus aureus (BCID) NOT DETECTED NOT DETECTED Final   Methicillin resistance NOT DETECTED NOT DETECTED Final   Streptococcus species NOT DETECTED NOT DETECTED Final   Streptococcus agalactiae NOT DETECTED NOT DETECTED Final   Streptococcus pneumoniae NOT DETECTED NOT DETECTED Final   Streptococcus pyogenes NOT DETECTED NOT DETECTED Final   Acinetobacter baumannii NOT DETECTED NOT DETECTED Final   Enterobacteriaceae species NOT DETECTED NOT DETECTED Final   Enterobacter cloacae complex NOT DETECTED NOT DETECTED Final   Escherichia coli NOT DETECTED NOT DETECTED Final   Klebsiella oxytoca NOT DETECTED NOT DETECTED Final   Klebsiella pneumoniae NOT DETECTED NOT DETECTED Final   Proteus species NOT DETECTED NOT DETECTED Final   Serratia marcescens NOT DETECTED NOT DETECTED Final   Haemophilus influenzae NOT DETECTED NOT DETECTED Final   Neisseria meningitidis NOT DETECTED NOT DETECTED Final   Pseudomonas aeruginosa NOT DETECTED NOT DETECTED Final   Candida albicans NOT DETECTED NOT DETECTED Final   Candida glabrata NOT DETECTED NOT DETECTED Final   Candida krusei NOT DETECTED NOT DETECTED Final   Candida parapsilosis NOT DETECTED NOT DETECTED Final   Candida tropicalis NOT DETECTED NOT DETECTED Final    Comment: Performed at [facility], [place]. [address], [place], [place] [address]  Group A Strep by PCR     Status: None   Collection Time: 01/15/18  8:19 PM  Result Value Ref Range Status   Group A Strep by PCR NOT DETECTED NOT DETECTED Final    Comment: Performed at [facility], [address] [place], [place] [address]  Respiratory Panel by PCR     Status:  None   Collection Time: 01/16/18  7:52 AM  Result Value Ref Range Status   Adenovirus NOT DETECTED NOT DETECTED Final   Coronavirus 229E NOT DETECTED NOT DETECTED Final   Coronavirus HKU1 NOT DETECTED NOT DETECTED Final   Coronavirus NL63 NOT DETECTED NOT DETECTED Final   Coronavirus  OC43 NOT DETECTED NOT DETECTED Final   Metapneumovirus NOT DETECTED NOT DETECTED Final   Rhinovirus / Enterovirus NOT DETECTED NOT DETECTED Final   Influenza A NOT DETECTED NOT DETECTED Final   Influenza B NOT DETECTED NOT DETECTED Final   Parainfluenza Virus 1 NOT DETECTED NOT DETECTED Final   Parainfluenza Virus 2 NOT DETECTED NOT DETECTED Final   Parainfluenza Virus 3 NOT DETECTED NOT DETECTED Final   Parainfluenza Virus 4 NOT DETECTED NOT DETECTED Final   Respiratory Syncytial Virus NOT DETECTED NOT DETECTED Final   Bordetella pertussis NOT DETECTED NOT DETECTED Final   Chlamydophila pneumoniae NOT DETECTED NOT DETECTED Final   Mycoplasma pneumoniae NOT DETECTED NOT DETECTED Final  MRSA PCR Screening     Status: None   Collection Time: 01/16/18 11:08 AM  Result Value Ref Range Status   MRSA by PCR NEGATIVE NEGATIVE Final    Comment:        The GeneXpert MRSA Assay (FDA approved for NASAL specimens only), is one component of a comprehensive MRSA colonization surveillance program. It is not intended to diagnose MRSA infection nor to guide or monitor treatment for MRSA infections. Performed at [facility], [place] [address], [place], [place] [address]   Culture, blood (Routine X 2) w Reflex to ID Panel     Status: None (Preliminary result)   Collection Time: 01/17/18 12:57 PM  Result Value Ref Range Status   Specimen Description   Final    BLOOD LEFT ARM Performed at [facility] [address] [place], [place] [address]    Special Requests   Final    BOTTLES DRAWN AEROBIC AND ANAEROBIC Blood Culture adequate volume   Culture   Final    NO GROWTH 4 DAYS Performed at [facility], [place] [address] [place], [place] [address]    Report Status PENDING  Incomplete      Radiology Studies: Mr Jeri Cos Wo Contrast  Result Date: 01/20/2018 CLINICAL DATA:  62 year old male with sepsis secondary to methicillin susceptible Staphylococcus  epidermidis, prosthetic valve endocarditis. EXAM: MRI HEAD WITHOUT AND WITH CONTRAST TECHNIQUE: Multiplanar, multiecho pulse sequences of the brain and surrounding structures were obtained without and with intravenous contrast. CONTRAST:  8 milliliters Gadavist. COMPARISON:  No prior brain imaging. FINDINGS: Brain: There are 2 small cortical foci of restricted diffusion in the left parietal lobe, that on series 3, image 36 is most apparent. There is minimal associated T2 and FLAIR hyperintensity. No associated hemorrhage or hemosiderin. However, there is an associated small focus of leptomeningeal enhancement best seen on series 13, image 10) matter near the infarct site. No masslike enhancement. No other No abnormal enhancement or dural thickening identified. No other restricted diffusion. No midline shift, mass effect, evidence of mass lesion, ventriculomegaly, extra-axial fluid collection or acute intracranial hemorrhage. Cervicomedullary junction and pituitary are within normal limits. Incidental left ambient cistern intracranial lipoma measuring about 15 millimeters (series 5, image 15). Outside of the acute findings largely normal for age gray and white matter signal; minimal nonspecific white matter T2 and FLAIR hyperintensity. The deep gray matter nuclei, brainstem, and cerebellum appear normal. Vascular: Major intracranial vascular flow voids  are preserved. The major dural venous sinuses are enhancing and appear patent. Skull and upper cervical spine: Visualized bone marrow signal is within normal limits. Negative visible cervical spine. Sinuses/Orbits: Normal orbits soft tissues. Paranasal sinuses are clear. Other: Mastoid air cells are clear. Visible internal auditory structures appear normal. Scalp and face soft tissues appear negative. IMPRESSION: 1. Two tiny cortical infarcts suspected in the left parietal lobe. This is suspicious for sequelae of Septic Emboli given associated mild regional  leptomeningeal enhancement, which might reflect a mild associated Cerebritis. But there is no associated hemorrhage or mass effect. 2. No other acute intracranial abnormality, and otherwise negative for age MRI appearance of the brain . 3. 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 Electronically Signed   By: Genevie Ann M.D.   On: 01/20/2018 16:34     Scheduled Meds: . enoxaparin (LOVENOX) injection  40 mg Subcutaneous QHS  . rifampin  300 mg Oral TID WC   Continuous Infusions: . sodium chloride 250 mL (01/19/18 0612)  .  ceFAZolin (ANCEF) IV 2 g (01/21/18 0953)  . gentamicin 80 mg (01/21/18 0252)     LOS: 6 days   Time Spent in minutes   45 minutes (greater than 50% of time spent with patient face to face, as well as reviewing records, calling consults, and formulating a plan)   Cristal Ford D.O. on 01/21/2018 at 10:23 AM  Between 7am to 7pm - Please see pager noted on [email or web address]  After 7pm go to [email or web address]  And look for the night coverage person covering for me after hours  [facility] Office  [phone]

## 2018-01-21 NOTE — Progress Notes (Signed)
Patient is eating and swallowing normally today without coughing or difficulty.  Patient took morning medication and ate breakfast today without any issues.

## 2018-01-21 NOTE — Final Consult Note (Signed)
[facility] Progress Note  2 Days Post-Op  Subjective: CC: s/p excision of chronic pilonidal sinus tract Mild pain with pressure on incision. No other complaints.   Objective: Vital signs in last 24 hours: Temp:  [98.1 F (36.7 C)-98.8 F (37.1 C)] 98.1 F (36.7 C) (11/02 0811) Pulse Rate:  [72-97] 86 (11/02 0811) Resp:  [9-28] 17 (11/02 0811) BP: (99-120)/(67-106) 111/81 (11/02 0811) SpO2:  [92 %-99 %] 99 % (11/02 0811) Last BM Date: 01/18/18  Intake/Output from previous day: 11/01 0701 - 11/02 0700 In: 1478 [P.O.:1320; IV Piggyback:150] Out: -  Intake/Output this shift: No intake/output data recorded.  PE: Skin: wound clean with minimal SS drainage on packing, no surrounding erythema or induration   Lab Results:  Recent Labs    01/20/18 0330  WBC 14.7*  HGB 9.5*  HCT 32.3*  PLT 356   BMET Recent Labs    01/19/18 0603 01/20/18 0330 01/21/18 0432  NA 138 139  --   K 4.3 4.4  --   CL 104 105  --   CO2 26 28  --   GLUCOSE 106* 117*  --   BUN 12 10  --   CREATININE 0.82 1.03 0.98  CALCIUM 10.4* 10.4*  --    PT/INR No results for input(s): LABPROT, INR in the last 72 hours. CMP     Component Value Date/Time   NA 139 01/20/2018 0330   K 4.4 01/20/2018 0330   CL 105 01/20/2018 0330   CO2 28 01/20/2018 0330   GLUCOSE 117 (H) 01/20/2018 0330   BUN 10 01/20/2018 0330   CREATININE 0.98 01/21/2018 0432   CREATININE 1.04 12/13/2017 0936   CALCIUM 10.4 (H) 01/20/2018 0330   PROT 6.3 (L) 01/20/2018 0330   ALBUMIN 2.8 (L) 01/20/2018 0330   AST 40 01/20/2018 0330   ALT 40 01/20/2018 0330   ALKPHOS 98 01/20/2018 0330   BILITOT 0.6 01/20/2018 0330   GFRNONAA >60 01/21/2018 0432   GFRAA >60 01/21/2018 0432   Lipase  No results found for: LIPASE     Studies/Results: Mr Jeri Cos GN Contrast  Result Date: 01/20/2018 CLINICAL DATA:  62 year old male with sepsis secondary to methicillin susceptible Staphylococcus epidermidis, prosthetic valve  endocarditis. EXAM: MRI HEAD WITHOUT AND WITH CONTRAST TECHNIQUE: Multiplanar, multiecho pulse sequences of the brain and surrounding structures were obtained without and with intravenous contrast. CONTRAST:  8 milliliters Gadavist. COMPARISON:  No prior brain imaging. FINDINGS: Brain: There are 2 small cortical foci of restricted diffusion in the left parietal lobe, that on series 3, image 36 is most apparent. There is minimal associated T2 and FLAIR hyperintensity. No associated hemorrhage or hemosiderin. However, there is an associated small focus of leptomeningeal enhancement best seen on series 13, image 10) matter near the infarct site. No masslike enhancement. No other No abnormal enhancement or dural thickening identified. No other restricted diffusion. No midline shift, mass effect, evidence of mass lesion, ventriculomegaly, extra-axial fluid collection or acute intracranial hemorrhage. Cervicomedullary junction and pituitary are within normal limits. Incidental left ambient cistern intracranial lipoma measuring about 15 millimeters (series 5, image 15). Outside of the acute findings largely normal for age gray and white matter signal; minimal nonspecific white matter T2 and FLAIR hyperintensity. The deep gray matter nuclei, brainstem, and cerebellum appear normal. Vascular: Major intracranial vascular flow voids are preserved. The major dural venous sinuses are enhancing and appear patent. Skull and upper cervical spine: Visualized bone marrow signal is within normal limits. Negative visible  cervical spine. Sinuses/Orbits: Normal orbits soft tissues. Paranasal sinuses are clear. Other: Mastoid air cells are clear. Visible internal auditory structures appear normal. Scalp and face soft tissues appear negative. IMPRESSION: 1. Two tiny cortical infarcts suspected in the left parietal lobe. This is suspicious for sequelae of Septic Emboli given associated mild regional leptomeningeal enhancement, which might  reflect a mild associated Cerebritis. But there is no associated hemorrhage or mass effect. 2. No other acute intracranial abnormality, and otherwise negative for age MRI appearance of the brain . 3. 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 Electronically Signed   By: Genevie Ann M.D.   On: 01/20/2018 16:34    Anti-infectives: Anti-infectives (From admission, onward)   Start     Dose/Rate Route Frequency Ordered Stop   01/20/18 1900  gentamicin (GARAMYCIN) IVPB 80 mg     80 mg 100 mL/hr over 30 Minutes Intravenous Every 8 hours 01/20/18 0945     01/20/18 1400  rifampin (RIFADIN) capsule 300 mg  Status:  Discontinued     300 mg Oral Every 8 hours 01/20/18 0945 01/20/18 1000   01/20/18 1200  rifampin (RIFADIN) capsule 300 mg     300 mg Oral 3 times daily with meals 01/20/18 1000     01/20/18 1100  gentamicin (GARAMYCIN) IVPB 120 mg     120 mg 200 mL/hr over 30 Minutes Intravenous  Once 01/20/18 0945 01/20/18 1251   01/17/18 1400  ceFAZolin (ANCEF) IVPB 2g/100 mL premix     2 g 200 mL/hr over 30 Minutes Intravenous Every 8 hours 01/17/18 1156     01/17/18 1030  cefTRIAXone (ROCEPHIN) 2 g in sodium chloride 0.9 % 100 mL IVPB  Status:  Discontinued     2 g 200 mL/hr over 30 Minutes Intravenous Every 24 hours 01/17/18 0919 01/17/18 1156   01/16/18 0800  vancomycin (VANCOCIN) 1,750 mg in sodium chloride 0.9 % 500 mL IVPB  Status:  Discontinued     1,750 mg 250 mL/hr over 120 Minutes Intravenous Every 24 hours 01/16/18 0136 01/17/18 0919   01/16/18 0600  piperacillin-tazobactam (ZOSYN) IVPB 3.375 g  Status:  Discontinued     3.375 g 12.5 mL/hr over 240 Minutes Intravenous Every 8 hours 01/16/18 0136 01/17/18 0918   01/15/18 2130  piperacillin-tazobactam (ZOSYN) IVPB 3.375 g     3.375 g 100 mL/hr over 30 Minutes Intravenous  Once 01/15/18 2123 01/15/18 2211   01/15/18 2130  vancomycin (VANCOCIN) IVPB 1000 mg/200 mL premix     1,000 mg 200 mL/hr over 60 Minutes Intravenous  Once  01/15/18 2123 01/15/18 2313   01/15/18 2130  oseltamivir (TAMIFLU) capsule 75 mg     75 mg Oral  Once 01/15/18 2123 01/15/18 2134       Assessment/Plan Chronic pilonidal sinus tract -this was excised at the bedside.  No evidence of infection. -dressing removed, minimal SS drainage - cleanse daily and prn after BMs, apply dry gauze as needed - we will sign off, no further general surgery needs at this time  Endocarditis  Staph bacteremia  LOS: 6 days    Brigid Re , [facility] 01/21/2018, 9:04 AM Pager: [phone] Consults: [phone] Mon-Fri 7:00 am-4:30 pm Sat-Sun 7:00 am-11:30 am

## 2018-01-21 NOTE — Progress Notes (Signed)
Performed NIHSS assessment once per Dr. Ree Kida, MD order score=0.  No deficits observed, see flow sheet for assessment.

## 2018-01-21 NOTE — Progress Notes (Signed)
CARDIAC REHAB PHASE I   PRE:  Rate/Rhythm: 88 SR  BP:  Supine:   Sitting: 120/74  Standing:    SaO2: 97 RA  MODE:  Ambulation: 1580 ft   POST:  Rate/Rhythm: 101 ST  BP:  Supine:   Sitting: 98/75  Standing:    SaO2: 98 RA 1400-1430 Pt tolerated ambulation well without c/o. VS stable Pt to side of bed after walk with call light in reach and wife present.  Rodney Langton RN 01/21/2018 2:35 PM

## 2018-01-22 LAB — BASIC METABOLIC PANEL
ANION GAP: 5 (ref 5–15)
BUN: 15 mg/dL (ref 6–20)
CALCIUM: 9.5 mg/dL (ref 8.9–10.3)
CHLORIDE: 105 mmol/L (ref 98–111)
CO2: 25 mmol/L (ref 22–32)
Creatinine, Ser: 0.93 mg/dL (ref 0.61–1.24)
GFR calc non Af Amer: >60 mL/min (ref >60)
GLUCOSE: 100 mg/dL — AB (ref 70–99)
POTASSIUM: 4.1 mmol/L (ref 3.5–5.1)
Sodium: 135 mmol/L (ref 135–145)

## 2018-01-22 LAB — CBC
HEMATOCRIT: 29.9 % — AB (ref 39.0–52.0)
HEMOGLOBIN: 8.8 g/dL — AB (ref 13.0–17.0)
MCH: 25.4 pg — ABNORMAL LOW (ref 26.0–34.0)
MCHC: 29.4 g/dL — ABNORMAL LOW (ref 30.0–36.0)
MCV: 86.2 fL (ref 80.0–100.0)
NRBC: 0 % (ref 0.0–0.2)
Platelets: 363 10*3/uL (ref 150–400)
RBC: 3.47 MIL/uL — AB (ref 4.22–5.81)
RDW: 13.5 % (ref 11.5–15.5)
WBC: 17.8 10*3/uL — AB (ref 4.0–10.5)

## 2018-01-22 LAB — CULTURE, BLOOD (ROUTINE X 2)
CULTURE: NO GROWTH
Special Requests: ADEQUATE

## 2018-01-22 LAB — OCCULT BLOOD X 1 CARD TO LAB, STOOL: Fecal Occult Bld: NEGATIVE

## 2018-01-22 MED ORDER — GENTAMICIN IN SALINE 1.6-0.9 MG/ML-% IV SOLN
80.0000 mg | Freq: Two times a day (BID) | INTRAVENOUS | Status: DC
  Administered 2018-01-22 – 2018-01-26 (×7): 80 mg via INTRAVENOUS
  Filled 2018-01-22 (×8): qty 50

## 2018-01-22 NOTE — Evaluation (Signed)
Speech Language Pathology Evaluation Patient Details Name: Patrick Walter MRN: [id number] DOB: [birth year] Today's Date: 01/22/2018 Time: 6144-3154 SLP Time Calculation (min) (ACUTE ONLY): 9 min  Problem List:  Patient Active Problem List   Diagnosis Date Noted  . Septic embolism ([place])   . Prosthetic valve endocarditis ([place])   . Staphylococcus epidermidis bacteremia   . Normocytic anemia 01/16/2018  . Sepsis ([place]) 01/15/2018  . Hypercalcemia 12/07/2017  . Primary osteoarthritis of left hip 07/01/2016  . Ascending aorta enlargement ([place])   . Family history of cancer in father 12/27/2013  . S/P aortic valve replacement with bioprosthetic valve 12/05/2013  . Low back pain 07/24/2013  . Essential hypertension, benign 07/24/2013   Past Medical History:  Past Medical History:  Diagnosis Date  . Aortic stenosis 07/24/2013  . Ascending aorta enlargement ([place])   . Bicuspid aortic valve   . Coronary artery disease   . Heart murmur   . Hx of echocardiogram    post AVR >> Echo (10/15):  Mild LVH, mod focal basal septal hypertrophy, EF 55-60%, Gr 1 DD, AVR ok (mean 15 mmHg), dilated ascending Aorta (44 mm), mod LAE  . Hypertension   . Patent foramen ovale 10/23/2013   Closed at the time of aortic valve replacement  . S/P aortic valve replacement with bioprosthetic valve 12/05/2013   25 mm [facility] Ease bovine pericardial tissue valve   . Septic embolism ([facility])    Brain, spleen, superior mesenteric artery   Past Surgical History:  Past Surgical History:  Procedure Laterality Date  . AORTIC VALVE REPLACEMENT N/A 12/05/2013   Procedure: AORTIC VALVE REPLACEMENT (AVR);  Surgeon: [name], MD;  Location: [place];  Service: Open Heart Surgery;  Laterality: N/A;  . HAND SURGERY Left 07/1977  . INTRAOPERATIVE TRANSESOPHAGEAL ECHOCARDIOGRAM N/A 12/05/2013   Procedure: INTRAOPERATIVE TRANSESOPHAGEAL ECHOCARDIOGRAM;  Surgeon: [name], MD;  Location: [place];  Service: Open Heart  Surgery;  Laterality: N/A;  . LEFT AND RIGHT HEART CATHETERIZATION WITH CORONARY ANGIOGRAM N/A 11/08/2013   Procedure: LEFT AND RIGHT HEART CATHETERIZATION WITH CORONARY ANGIOGRAM;  Surgeon: Blane Ohara, MD;  Location: [facility] CATH LAB;  Service: Cardiovascular;  Laterality: N/A;  . PATENT FORAMEN OVALE CLOSURE N/A 12/05/2013   Procedure: PATENT FORAMEN OVALE CLOSURE;  Surgeon: [name], MD;  Location: [place];  Service: Open Heart Surgery;  Laterality: N/A;  . TEE WITHOUT CARDIOVERSION N/A 10/23/2013   Procedure: TRANSESOPHAGEAL ECHOCARDIOGRAM (TEE);  Surgeon: Larey Dresser, MD;  Location: [place];  Service: Cardiovascular;  Laterality: N/A;  . TEE WITHOUT CARDIOVERSION N/A 01/19/2018   Procedure: TRANSESOPHAGEAL ECHOCARDIOGRAM (TEE);  Surgeon: Sanda Klein, MD;  Location: [facility] ENDOSCOPY;  Service: Cardiovascular;  Laterality: N/A;  . VASECTOMY  06/2002   HPI:  62 year old male admitted with sepsis secondary to methicillin suspeptible Staphylococcus epidermis. TEE revealed large vegetation on prosthetic aortic valve. H/o dental work in August requiring abx as well as steroid injection in the hip 1 month ago. MRI of the brain ordered to r/o presence of subclinical embolization, noted 2 tiny cortical infarcts suspecte din the left parietal lobe suspicious for sequela of septic emboli. s/p Excision of Pilonidal Cyst 11/1.    Assessment / Plan / Recommendation Clinical Impression  Cognitive-linguistic functional appearing largely WFL. Patient with fluent verbal output and intact comprehension. Mild difficulty with mathmatical reasoning however patient verbalizing and appearing generally anxious and distracted by acute medical condition. No acute SLP f/u indicated. Educated patient on possibility of OP  SLP services if patient feels necessary after d/c.     SLP Assessment  SLP Recommendation/Assessment: Patient does not need any further Speech Lanaguage Pathology Services SLP Visit Diagnosis:  Cognitive communication deficit (R41.841)    Follow Up Recommendations  None          SLP Evaluation Cognition  Overall Cognitive Status: Within Functional Limits for tasks assessed       Comprehension  Auditory Comprehension Overall Auditory Comprehension: Appears within functional limits for tasks assessed Visual Recognition/Discrimination Discrimination: Within Function Limits Reading Comprehension Reading Status: Not tested    Expression Expression Primary Mode of Expression: Verbal Verbal Expression Overall Verbal Expression: Appears within functional limits for tasks assessed   Oral / Motor  Oral Motor/Sensory Function Overall Oral Motor/Sensory Function: Within functional limits Motor Speech Overall Motor Speech: Appears within functional limits for tasks assessed   Gabriel Rainwater MA, CCC-SLP             Dylon Correa Meryl 01/22/2018, 9:10 AM

## 2018-01-22 NOTE — Progress Notes (Signed)
Pharmacy Antibiotic Note  Patrick Walter is a 62 y.o. male admitted on 01/15/2018 with endocarditis.  Pharmacy has been consulted for gentamicin dosing.  Gent trough at goal.  Plan: This patient's current antibiotics will be continued without adjustments.  Height: 5\' 11"  (180.3 cm) Weight: 178 lb (80.7 kg) IBW/kg (Calculated) : 75.3  Temp (24hrs), Avg:98.7 F (37.1 C), Min:98.1 F (36.7 C), Max:100.1 F (37.8 C)  Recent Labs  Lab 01/15/18 2024 01/16/18 0352 01/16/18 0735 01/17/18 0644 01/18/18 0553 01/19/18 0603 01/20/18 0330 01/21/18 0432 01/21/18 1831  WBC  --  16.0* 16.8* 15.5* 14.8*  --  14.7*  --   --   CREATININE  --  0.83  --  0.87 0.81 0.82 1.03 0.98  --   LATICACIDVEN 1.54  --   --   --   --   --   --   --   --   GENTTROUGH  --   --   --   --   --   --   --   --  1.7    Estimated Creatinine Clearance: 85.4 mL/min (by C-G formula based on SCr of 0.98 mg/dL).    No Known Allergies   Thank you for allowing pharmacy to be a part of this patient's care.  Wynona Neat, PharmD, BCPS  01/22/2018 12:07 AM

## 2018-01-22 NOTE — Progress Notes (Signed)
PROGRESS NOTE    Patrick Walter  XNA:[id number] DOB: 08/19/1955 DOA: 01/15/2018 PCP: [name], MD   Brief Narrative:  HPI on 01/16/2018 by Dr. Gean Birchwood Patrick Walter is a 62 y.o. male with history of bicuspid aortic valve status post bioprosthetic valve replacement in 2015 and at that time patient also has had PFO closure also receives injection for right hip osteoarthritis last one was last month has had recently traveled to [place] last week for sports event presents to the ER with complaints of having fever and chills with generalized body ache for the last 2 days.  Denies any chest pain headache visual symptoms sore throat nausea vomiting diarrhea or productive cough or shortness of breath.  Since patient has been having persistent symptoms last 2 days patient comes to the ER.  Interim history Patient found to have sepsis secondary to methicillin susceptible Staphylococcus epidermidis.  TEE revealed large vegetation on prosthetic aortic valve.  Cardiology as well as cardiothoracic surgery consulted and following. Assessment & Plan   Sepsis secondary to methicillin susceptible Staphylococcus epidermidis/prosthetic valve endocarditis -Patient had a history of dental work in August for which he took Amoxil.  Also had steroid injection in the hip approximately 1 month ago. -Influenza PCR as well as respiratory panel as well as strep throat were negative -HIV nonreactive -ID consulted and appreciated, patient was initially on Rocephin however transition to Ancef on 01/17/2018 -Patient was transferred [facility] -TEE showed large vegetation on the prosthetic aortic valve -Blood cultures from 01/17/2018 show no growth to date -Cardiothoracic surgery consulted and appreciated, MRI of the brain as well as CT angiography ordered to rule out presence of subclinical embolization.  General surgery consulted for possible pilonidal  cyst. -MRI obtained: 2 tiny cortical infarct suspected in the left parietal lobe suspicious for sequela of septic emboli -CTA chest/abd/pelvis: Abrupt embolic occlusion of SMA, abrupt embolic occlusion of the distal splenic artery with multiple splenic infarcts, small left renal infarct, 4.2 cm ascending thoracic aortic aneurysm, no evidence of ischemic bowel -Discussed findings with vascular surgery, Dr. Donnetta Hutching, recommended speaking with thoracic surgery regarding possible for anticoagulation.  Feels these findings are all incidental as patient is currently asymptomatic.  Septic emobli -Noted on MRI as above -Discussed with neurology, Dr. Leonel Ramsay, antithrombotics are not indicated -Speech therapy consulted -Currently patient does not need physical or occupational therapy, this may need to be readdressed after valve replacement/surgery  Left hip osteoarthritis -Celebrex on hold  Normocytic anemia -hemoglobin has been stable  -labs pending stable   Pain foramen ovale/asending aortic enlargement/moderate diastolic CHF -Noted on echocardiogram in 2018, per TEE, EF 60% with mild MR and AI -Stable  Pilonidal cyst -As above General surgery consulted and appreciated -s/p excision of chronic pilonidal sinus tract -cleanse daily and PRN after bowel movements, apply dry gauze as needed -surgery signed off  DVT Prophylaxis  Lovenox  Code Status: Full  Family Communication: None at bedside  Disposition Plan: Admitted. Pending recommendations from cardiothoracic surgery.  Consultants Infectious disease Cardiology Cardiothoracic surgery General surgery Neurology, Dr. Leonel Ramsay, via phone Vascular surgery, Dr. Donnetta Hutching, via phone  Procedures  TEE Excision of chronic pilonidal sinus tract  Antibiotics   Anti-infectives (From admission, onward)   Start     Dose/Rate Route Frequency Ordered Stop   01/20/18 1900  gentamicin (GARAMYCIN) IVPB 80 mg     80 mg 100 mL/hr over 30  Minutes Intravenous Every 8 hours 01/20/18 0945  01/20/18 1400  rifampin (RIFADIN) capsule 300 mg  Status:  Discontinued     300 mg Oral Every 8 hours 01/20/18 0945 01/20/18 1000   01/20/18 1200  rifampin (RIFADIN) capsule 300 mg     300 mg Oral 3 times daily with meals 01/20/18 1000     01/20/18 1100  gentamicin (GARAMYCIN) IVPB 120 mg     120 mg 200 mL/hr over 30 Minutes Intravenous  Once 01/20/18 0945 01/20/18 1251   01/17/18 1400  ceFAZolin (ANCEF) IVPB 2g/100 mL premix     2 g 200 mL/hr over 30 Minutes Intravenous Every 8 hours 01/17/18 1156     01/17/18 1030  cefTRIAXone (ROCEPHIN) 2 g in sodium chloride 0.9 % 100 mL IVPB  Status:  Discontinued     2 g 200 mL/hr over 30 Minutes Intravenous Every 24 hours 01/17/18 0919 01/17/18 1156   01/16/18 0800  vancomycin (VANCOCIN) 1,750 mg in sodium chloride 0.9 % 500 mL IVPB  Status:  Discontinued     1,750 mg 250 mL/hr over 120 Minutes Intravenous Every 24 hours 01/16/18 0136 01/17/18 0919   01/16/18 0600  piperacillin-tazobactam (ZOSYN) IVPB 3.375 g  Status:  Discontinued     3.375 g 12.5 mL/hr over 240 Minutes Intravenous Every 8 hours 01/16/18 0136 01/17/18 0918   01/15/18 2130  piperacillin-tazobactam (ZOSYN) IVPB 3.375 g     3.375 g 100 mL/hr over 30 Minutes Intravenous  Once 01/15/18 2123 01/15/18 2211   01/15/18 2130  vancomycin (VANCOCIN) IVPB 1000 mg/200 mL premix     1,000 mg 200 mL/hr over 60 Minutes Intravenous  Once 01/15/18 2123 01/15/18 2313   01/15/18 2130  oseltamivir (TAMIFLU) capsule 75 mg     75 mg Oral  Once 01/15/18 2123 01/15/18 2134      Subjective:   Patrick Walter seen and examined today.  Patient has no complaints at this time.  Would like to go home and do a few things prior to surgery but understands the need for hospitalization.  Denies current chest pain, shortness of breath, abdominal pain, nausea or vomiting, diarrhea or constipation.  Objective:   Vitals:   01/21/18 2016 01/21/18 2313 01/22/18  0540 01/22/18 0724  BP: 98/63 109/67 108/76 104/76  Pulse: 83 82  82  Resp: (!) 28 19 19 13   Temp: 98.8 F (37.1 C) 98.4 F (36.9 C) 98.2 F (36.8 C) 98.8 F (37.1 C)  TempSrc:  Oral Oral Oral  SpO2: 94% 98% 98% 97%  Weight:      Height:       No intake or output data in the 24 hours ending 01/22/18 1020 Filed Weights   01/15/18 1923  Weight: 80.7 kg   Exam  General: Well developed, well nourished, NAD, appears stated age  4: NCAT, mucous membranes moist.   Neck: Supple  Cardiovascular: S1 S2 auscultated, RRR, +SEM  Respiratory: Clear to auscultation bilaterally with equal chest rise  Abdomen: Soft, nontender, nondistended, + bowel sounds  Extremities: warm dry without cyanosis clubbing or edema  Neuro: AAOx3, nonocal  Psych: Normal affect and demeanor with intact judgement and insight, pleasant   Data Reviewed: I have personally reviewed following labs and imaging studies  CBC: Recent Labs  Lab 01/15/18 2019 01/16/18 0352 01/16/18 0735 01/17/18 0644 01/18/18 0553 01/20/18 0330 01/22/18 0303  WBC 16.8* 16.0* 16.8* 15.5* 14.8* 14.7* 17.8*  NEUTROABS 13.2* 12.1*  --  11.5*  --   --   --   HGB 10.4* 9.1* 8.9*  9.0* 8.8* 9.5* 8.8*  HCT 33.7* 29.8* 28.9* 29.0* 29.2* 32.3* 29.9*  MCV 86.4 87.4 87.6 87.3 87.2 88.0 86.2  PLT 210 177 176 196 251 356 474   Basic Metabolic Panel: Recent Labs  Lab 01/15/18 2019 01/16/18 0352  01/18/18 0553 01/19/18 0603 01/20/18 0330 01/21/18 0432 01/22/18 0303  NA 137 139  --   --  138 139  --  135  K 4.1 4.3  --   --  4.3 4.4  --  4.1  CL 105 108  --   --  104 105  --  105  CO2 24 24  --   --  26 28  --  25  GLUCOSE 103* 114*  --   --  106* 117*  --  100*  BUN 19 16  --   --  12 10  --  15  CREATININE 1.03 0.83   < > 0.81 0.82 1.03 0.98 0.93  CALCIUM 9.5 9.0  --   --  10.4* 10.4*  --  9.5   < > = values in this interval not displayed.   GFR: Estimated Creatinine Clearance: 90 mL/min (by C-G formula based on SCr  of 0.93 mg/dL). Liver Function Tests: Recent Labs  Lab 01/15/18 2019 01/16/18 0352 01/20/18 0330  AST 28 21 40  ALT 18 17 40  ALKPHOS 70 54 98  BILITOT 0.9 1.0 0.6  PROT 6.6 5.7* 6.3*  ALBUMIN 3.2* 2.7* 2.8*   No results for input(s): LIPASE, AMYLASE in the last 168 hours. No results for input(s): AMMONIA in the last 168 hours. Coagulation Profile: No results for input(s): INR, PROTIME in the last 168 hours. Cardiac Enzymes: No results for input(s): CKTOTAL, CKMB, CKMBINDEX, TROPONINI in the last 168 hours. BNP (last 3 results) No results for input(s): PROBNP in the last 8760 hours. HbA1C: No results for input(s): HGBA1C in the last 72 hours. CBG: No results for input(s): GLUCAP in the last 168 hours. Lipid Profile: No results for input(s): CHOL, HDL, LDLCALC, TRIG, CHOLHDL, LDLDIRECT in the last 72 hours. Thyroid Function Tests: No results for input(s): TSH, T4TOTAL, FREET4, T3FREE, THYROIDAB in the last 72 hours. Anemia Panel: No results for input(s): VITAMINB12, FOLATE, FERRITIN, TIBC, IRON, RETICCTPCT in the last 72 hours. Urine analysis:    Component Value Date/Time   COLORURINE YELLOW 01/15/2018 2214   APPEARANCEUR CLEAR 01/15/2018 2214   LABSPEC 1.010 01/15/2018 2214   PHURINE 7.0 01/15/2018 2214   GLUCOSEU NEGATIVE 01/15/2018 2214   HGBUR NEGATIVE 01/15/2018 2214   BILIRUBINUR NEGATIVE 01/15/2018 2214   KETONESUR NEGATIVE 01/15/2018 2214   PROTEINUR NEGATIVE 01/15/2018 2214   UROBILINOGEN 0.2 12/03/2013 1344   NITRITE NEGATIVE 01/15/2018 2214   LEUKOCYTESUR NEGATIVE 01/15/2018 2214   Sepsis Labs: @LABRCNTIP (procalcitonin:4,lacticidven:4)  ) Recent Results (from the past 240 hour(s))  Culture, blood (Routine X 2) w Reflex to ID Panel     Status: Abnormal   Collection Time: 01/15/18  8:15 PM  Result Value Ref Range Status   Specimen Description   Final    BLOOD RIGHT HAND Performed at [facility], [address] [place], [place] [address]     Special Requests   Final    BOTTLES DRAWN AEROBIC AND ANAEROBIC Blood Culture adequate volume Performed at [facility], [address] [place], [place] [address]    Culture  Setup Time   Final    IN BOTH AEROBIC AND ANAEROBIC BOTTLES GRAM POSITIVE COCCI CRITICAL  VALUE NOTED.  VALUE IS CONSISTENT WITH PREVIOUSLY REPORTED AND CALLED VALUE.    Culture (A)  Final    STAPHYLOCOCCUS EPIDERMIDIS SUSCEPTIBILITIES PERFORMED ON PREVIOUS CULTURE WITHIN THE LAST 5 DAYS. Performed at [facility], [place] [address], [place], [place] [address]    Report Status 01/19/2018 FINAL  Final  Culture, blood (Routine X 2) w Reflex to ID Panel     Status: Abnormal   Collection Time: 01/15/18  8:15 PM  Result Value Ref Range Status   Specimen Description   Final    BLOOD RIGHT ARM Performed at [facility], [address] [place], [place] [address]    Special Requests   Final    BOTTLES DRAWN AEROBIC AND ANAEROBIC Blood Culture adequate volume Performed at [facility], [address] [place], [place] [address]    Culture  Setup Time   Final    GRAM POSITIVE COCCI IN BOTH AEROBIC AND ANAEROBIC BOTTLES CRITICAL RESULT CALLED TO, READ BACK BY AND VERIFIED WITH: [facility] 01/16/18 1937 JDW    Culture STAPHYLOCOCCUS EPIDERMIDIS (A)  Final   Report Status 01/18/2018 FINAL  Final   Organism ID, Bacteria STAPHYLOCOCCUS EPIDERMIDIS  Final      Susceptibility   Staphylococcus epidermidis - MIC*    CIPROFLOXACIN <=0.5 SENSITIVE Sensitive     ERYTHROMYCIN <=0.25 SENSITIVE Sensitive     GENTAMICIN <=0.5 SENSITIVE Sensitive     OXACILLIN <=0.25 SENSITIVE Sensitive     TETRACYCLINE <=1 SENSITIVE Sensitive     VANCOMYCIN 1 SENSITIVE Sensitive     TRIMETH/SULFA <=10 SENSITIVE Sensitive     CLINDAMYCIN <=0.25 SENSITIVE Sensitive     RIFAMPIN <=0.5 SENSITIVE Sensitive     Inducible Clindamycin NEGATIVE Sensitive     * STAPHYLOCOCCUS EPIDERMIDIS  Blood Culture ID  Panel (Reflexed)     Status: Abnormal   Collection Time: 01/15/18  8:15 PM  Result Value Ref Range Status   Enterococcus species NOT DETECTED NOT DETECTED Final   Listeria monocytogenes NOT DETECTED NOT DETECTED Final   Staphylococcus species DETECTED (A) NOT DETECTED Final    Comment: Methicillin (oxacillin) susceptible coagulase negative staphylococcus. Possible blood culture contaminant (unless isolated from more than one blood culture draw or clinical case suggests pathogenicity). No antibiotic treatment is indicated for blood  culture contaminants. CRITICAL RESULT CALLED TO, READ BACK BY AND VERIFIED WITH: [facility] 01/16/18 1937 JDW    Staphylococcus aureus (BCID) NOT DETECTED NOT DETECTED Final   Methicillin resistance NOT DETECTED NOT DETECTED Final   Streptococcus species NOT DETECTED NOT DETECTED Final   Streptococcus agalactiae NOT DETECTED NOT DETECTED Final   Streptococcus pneumoniae NOT DETECTED NOT DETECTED Final   Streptococcus pyogenes NOT DETECTED NOT DETECTED Final   Acinetobacter baumannii NOT DETECTED NOT DETECTED Final   Enterobacteriaceae species NOT DETECTED NOT DETECTED Final   Enterobacter cloacae complex NOT DETECTED NOT DETECTED Final   Escherichia coli NOT DETECTED NOT DETECTED Final   Klebsiella oxytoca NOT DETECTED NOT DETECTED Final   Klebsiella pneumoniae NOT DETECTED NOT DETECTED Final   Proteus species NOT DETECTED NOT DETECTED Final   Serratia marcescens NOT DETECTED NOT DETECTED Final   Haemophilus influenzae NOT DETECTED NOT DETECTED Final   Neisseria meningitidis NOT DETECTED NOT DETECTED Final   Pseudomonas aeruginosa NOT DETECTED NOT DETECTED Final   Candida albicans NOT DETECTED NOT DETECTED Final   Candida glabrata NOT DETECTED NOT DETECTED Final   Candida krusei NOT DETECTED NOT DETECTED  Final   Candida parapsilosis NOT DETECTED NOT DETECTED Final   Candida tropicalis NOT DETECTED NOT DETECTED Final    Comment: Performed at [facility], [place] [address], [place], [place] [address]  Group A Strep by PCR     Status: None   Collection Time: 01/15/18  8:19 PM  Result Value Ref Range Status   Group A Strep by PCR NOT DETECTED NOT DETECTED Final    Comment: Performed at [facility], [address] [place], [place] [address]  Respiratory Panel by PCR     Status: None   Collection Time: 01/16/18  7:52 AM  Result Value Ref Range Status   Adenovirus NOT DETECTED NOT DETECTED Final   Coronavirus 229E NOT DETECTED NOT DETECTED Final   Coronavirus HKU1 NOT DETECTED NOT DETECTED Final   Coronavirus NL63 NOT DETECTED NOT DETECTED Final   Coronavirus OC43 NOT DETECTED NOT DETECTED Final   Metapneumovirus NOT DETECTED NOT DETECTED Final   Rhinovirus / Enterovirus NOT DETECTED NOT DETECTED Final   Influenza A NOT DETECTED NOT DETECTED Final   Influenza B NOT DETECTED NOT DETECTED Final   Parainfluenza Virus 1 NOT DETECTED NOT DETECTED Final   Parainfluenza Virus 2 NOT DETECTED NOT DETECTED Final   Parainfluenza Virus 3 NOT DETECTED NOT DETECTED Final   Parainfluenza Virus 4 NOT DETECTED NOT DETECTED Final   Respiratory Syncytial Virus NOT DETECTED NOT DETECTED Final   Bordetella pertussis NOT DETECTED NOT DETECTED Final   Chlamydophila pneumoniae NOT DETECTED NOT DETECTED Final   Mycoplasma pneumoniae NOT DETECTED NOT DETECTED Final  MRSA PCR Screening     Status: None   Collection Time: 01/16/18 11:08 AM  Result Value Ref Range Status   MRSA by PCR NEGATIVE NEGATIVE Final    Comment:        The GeneXpert MRSA Assay (FDA approved for NASAL specimens only), is one component of a comprehensive MRSA colonization surveillance program. It is not intended to diagnose MRSA infection nor to guide or monitor treatment for MRSA infections. Performed at [facility], [place] [address], [place], [place] [address]   Culture, blood (Routine X 2) w Reflex to ID Panel     Status: None (Preliminary  result)   Collection Time: 01/17/18 12:57 PM  Result Value Ref Range Status   Specimen Description   Final    BLOOD LEFT ARM Performed at [facility] [address], [place], [place] [address]    Special Requests   Final    BOTTLES DRAWN AEROBIC AND ANAEROBIC Blood Culture adequate volume   Culture   Final    NO GROWTH 4 DAYS Performed at [facility], [place] [address] [place], [place] [address]    Report Status PENDING  Incomplete      Radiology Studies: Dg Chest 2 View  Result Date: 01/21/2018 CLINICAL DATA:  Cough EXAM: CHEST - 2 VIEW COMPARISON:  CT of the chest from earlier in the same day. Chest x-ray from 01/15/2018 FINDINGS: Cardiac shadow is stable. Postoperative changes are again seen. The lungs are well aerated bilaterally. No acute infiltrate or sizable effusion is seen. No bony abnormality is noted. IMPRESSION: No acute abnormality noted. Electronically Signed   By: Inez Catalina M.D.   On: 01/21/2018 19:38   Mr Jeri Cos TK Contrast  Result Date: 01/20/2018 CLINICAL DATA:  62 year old male with sepsis secondary to methicillin susceptible Staphylococcus epidermidis, prosthetic valve endocarditis. EXAM: MRI HEAD WITHOUT  AND WITH CONTRAST TECHNIQUE: Multiplanar, multiecho pulse sequences of the brain and surrounding structures were obtained without and with intravenous contrast. CONTRAST:  8 milliliters Gadavist. COMPARISON:  No prior brain imaging. FINDINGS: Brain: There are 2 small cortical foci of restricted diffusion in the left parietal lobe, that on series 3, image 36 is most apparent. There is minimal associated T2 and FLAIR hyperintensity. No associated hemorrhage or hemosiderin. However, there is an associated small focus of leptomeningeal enhancement best seen on series 13, image 10) matter near the infarct site. No masslike enhancement. No other No abnormal enhancement or dural thickening identified. No other restricted diffusion. No midline shift,  mass effect, evidence of mass lesion, ventriculomegaly, extra-axial fluid collection or acute intracranial hemorrhage. Cervicomedullary junction and pituitary are within normal limits. Incidental left ambient cistern intracranial lipoma measuring about 15 millimeters (series 5, image 15). Outside of the acute findings largely normal for age gray and white matter signal; minimal nonspecific white matter T2 and FLAIR hyperintensity. The deep gray matter nuclei, brainstem, and cerebellum appear normal. Vascular: Major intracranial vascular flow voids are preserved. The major dural venous sinuses are enhancing and appear patent. Skull and upper cervical spine: Visualized bone marrow signal is within normal limits. Negative visible cervical spine. Sinuses/Orbits: Normal orbits soft tissues. Paranasal sinuses are clear. Other: Mastoid air cells are clear. Visible internal auditory structures appear normal. Scalp and face soft tissues appear negative. IMPRESSION: 1. Two tiny cortical infarcts suspected in the left parietal lobe. This is suspicious for sequelae of Septic Emboli given associated mild regional leptomeningeal enhancement, which might reflect a mild associated Cerebritis. But there is no associated hemorrhage or mass effect. 2. No other acute intracranial abnormality, and otherwise negative for age MRI appearance of the brain . 3. 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 Electronically Signed   By: Genevie Ann M.D.   On: 01/20/2018 16:34   Ct Angio Chest/abd/pel For Dissection W And/or W/wo  Result Date: 01/21/2018 CLINICAL DATA:  Chest and back pain. Prosthetic valve endocarditis. Evaluate for emboli. EXAM: CT ANGIOGRAPHY CHEST, ABDOMEN AND PELVIS TECHNIQUE: Multidetector CT imaging through the chest, abdomen and pelvis was performed using the standard protocol during bolus administration of intravenous contrast. Multiplanar reconstructed images and MIPs were obtained and reviewed to evaluate  the vascular anatomy. CONTRAST:  132mL ISOVUE-370 IOPAMIDOL (ISOVUE-370) INJECTION 76% COMPARISON:  Cardiac CT dated November 30, 2016. CT chest dated December 15, 2015. FINDINGS: CTA CHEST FINDINGS Cardiovascular: Unchanged aneurysmal dilatation of the ascending thoracic aorta, measuring up to 4.2 cm. No aortic dissection. No intramural hematoma on noncontrast images. No pulmonary embolism. Stable mild cardiomegaly. No pericardial effusion. Prior aortic valve replacement. Coronary artery atherosclerosis. Mediastinum/Nodes: No enlarged mediastinal, hilar, or axillary lymph nodes. Thyroid gland, trachea, and esophagus demonstrate no significant findings. Lungs/Pleura: Lungs are clear. No pleural effusion or pneumothorax. Musculoskeletal: No chest wall abnormality. No acute or significant osseous findings. Review of the MIP images confirms the above findings. CTA ABDOMEN AND PELVIS FINDINGS VASCULAR Aorta: Normal caliber aorta without aneurysm, dissection, vasculitis or significant stenosis. Celiac: Patent without evidence of aneurysm, dissection, vasculitis or significant stenosis. Abrupt occlusion of the distal splenic artery (series 7, image 162). Replaced left hepatic artery arising from the left gastric artery. SMA: Posterior luminal irregularity of the mid SMA. Abrupt occlusion of the SMA just distal to the origin of the right colic artery (series 11, image 122). Renals: Both renal arteries are patent without evidence of aneurysm, dissection, vasculitis, fibromuscular dysplasia or significant stenosis.  IMA: Patent without evidence of aneurysm, dissection, vasculitis or significant stenosis. Inflow: Patent without evidence of aneurysm, dissection, vasculitis or significant stenosis. Veins: Possible occluded branch of the SMV (series 7, image 200), although evaluation is limited on this arterial phase study. Review of the MIP images confirms the above findings. NON-VASCULAR Hepatobiliary: No focal liver  abnormality is seen. Gallbladder is contracted. No gallstones or biliary dilatation. Pancreas: Unremarkable. No pancreatic ductal dilatation or surrounding inflammatory changes. Spleen: Multiple wedge-shaped areas of hypoenhancement within the spleen, consistent with infarcts. Adrenals/Urinary Tract: The adrenal glands are unremarkable. Wedge-shaped hypodensity in the peripheral mid pole of the left kidney. Small nonobstructive calculus in the lower pole of the left kidney. 2.3 cm exophytic left renal simple cyst. Small chronic cortical defect in the upper pole of the right kidney. The right kidney is otherwise unremarkable. No hydronephrosis. The bladder is unremarkable. Stomach/Bowel: Stomach is within normal limits. Appendix appears normal. No evidence of bowel wall thickening, distention, or inflammatory changes. Redundant sigmoid colon. Lymphatic: No enlarged abdominal or pelvic lymph nodes. Reproductive: Prostate is unremarkable. Other: 3.0 x 1.8 cm area of ill-defined mesenteric soft tissue density in the mid abdomen just distal to the occluded distal SMA. No ascites or pneumoperitoneum. Musculoskeletal: No acute or significant osseous findings. Review of the MIP images confirms the above findings. IMPRESSION: Vascular: 1. 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 Vascular surgery consultation is recommended. 2. Posterior luminal irregularity of the mid SMA could reflect nonocclusive emboli or atherosclerotic plaque. 3. 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 4. Small left renal infarct. 5. Possibly occluded branch of the SMV, although evaluation is limited on this arterial phase study. Consider portal venous phase CT of the abdomen and pelvis for further evaluation. 6. Unchanged 4.2 cm ascending thoracic aortic aneurysm. Recommend annual imaging followup by CTA or MRA. This recommendation follows 2010  ACCF/AHA/AATS/ACR/ASA/SCA/SCAI/SIR/STS/SVM Guidelines for the Diagnosis and Management of Patients with Thoracic Aortic Disease. Circulation. 2010; 121: G644-I347 Non-vascular: 1. No evidence of ischemic bowel. 2. 3.0 x 1.8 cm area of ill-defined mesenteric soft tissue density in the mid abdomen near the occluded distal SMA branches may be inflammatory related to adjacent vessel occlusion. 3. Nonobstructive left nephrolithiasis. 4. No acute intrathoracic process. Critical Value/emergent results were called by telephone at the time of interpretation on 01/21/2018 at 5:03 pm to Dr. Cristal Ford, who verbally acknowledged these results. Electronically Signed   By: Titus Dubin M.D.   On: 01/21/2018 17:07     Scheduled Meds: . benzonatate  200 mg Oral TID  . enoxaparin (LOVENOX) injection  40 mg Subcutaneous QHS  . rifampin  300 mg Oral TID WC   Continuous Infusions: . sodium chloride 250 mL (01/19/18 0612)  .  ceFAZolin (ANCEF) IV 2 g (01/22/18 4259)  . gentamicin 80 mg (01/22/18 0236)     LOS: 7 days   Time Spent in minutes   45 minutes (greater than 50% of time spent with patient face to face, as well as reviewing records, calling consults, and formulating a plan)   Cristal Ford D.O. on 01/22/2018 at 10:20 AM  Between 7am to 7pm - Please see pager noted on [email or web address]  After 7pm go to [email or web address]  And look for the night coverage person covering for me after hours  [facility] Office  [phone]

## 2018-01-22 NOTE — Progress Notes (Signed)
Pharmacy Antibiotic Note  Patrick Walter is a 62 y.o. male admitted on 01/15/2018 with MSSE prosthetic aortic valve endocarditis.  Pharmacy has been consulted for gentamicin dosing .   -gentamicin trough= 1.7, SCr= 0.93, CrCl ~ 90  Plan: -Change gentamicin to 80mg  IV q12h -Will follow renal function, cultures and clinical progress   Height: 5\' 11"  (180.3 cm) Weight: 178 lb (80.7 kg) IBW/kg (Calculated) : 75.3  Temp (24hrs), Avg:98.7 F (37.1 C), Min:98.1 F (36.7 C), Max:100.1 F (37.8 C)  Recent Labs  Lab 01/15/18 2024  01/16/18 0735 01/17/18 0644 01/18/18 0553 01/19/18 0603 01/20/18 0330 01/21/18 0432 01/21/18 1831 01/22/18 0303  WBC  --    < > 16.8* 15.5* 14.8*  --  14.7*  --   --  17.8*  CREATININE  --    < >  --  0.87 0.81 0.82 1.03 0.98  --  0.93  LATICACIDVEN 1.54  --   --   --   --   --   --   --   --   --   GENTTROUGH  --   --   --   --   --   --   --   --  1.7  --    < > = values in this interval not displayed.    Estimated Creatinine Clearance: 90 mL/min (by C-G formula based on SCr of 0.93 mg/dL).    No Known Allergies  Antimicrobials this admission:  10/27 Tamiflu x 1 10/27 Vancomycin >> 10/29 10/27 Zosyn >> 10/29 10/29 Rocephin >> 10/29 10/29 Ancef >>  11/1 gent>>  Dose adjustments this admission:  11/2: gent trough= 1.7 (1830) 11/2 gent 80mg  IV q8h>> 80mg  IV q12h  Microbiology results:  10/27 BCx: 4/4 GPC (BCID: Staph sp, no resistance) 10/27 Group A Strep PCR: not detected 10/27 Influenza A/B: neg/neg 10/28 Respiratory panel: neg 10/28 CMV Ab: <0.6 10/28 HIV Ab: NR 10/28 MRSA PCR: negative 10/29 repeat bcx: ngtd  Thank you for allowing pharmacy to be a part of this patient's care.  Hildred Laser, PharmD Clinical Pharmacist **Pharmacist phone directory can now be found on [email or web address] ([email or web address]).  Listed under [place].

## 2018-01-23 ENCOUNTER — Inpatient Hospital Stay (HOSPITAL_COMMUNITY): Payer: 59

## 2018-01-23 LAB — CBC
HCT: 29.9 % — ABNORMAL LOW (ref 39.0–52.0)
Hemoglobin: 9.1 g/dL — ABNORMAL LOW (ref 13.0–17.0)
MCH: 25.9 pg — ABNORMAL LOW (ref 26.0–34.0)
MCHC: 30.4 g/dL (ref 30.0–36.0)
MCV: 85.2 fL (ref 80.0–100.0)
NRBC: 0 % (ref 0.0–0.2)
PLATELETS: 368 10*3/uL (ref 150–400)
RBC: 3.51 MIL/uL — AB (ref 4.22–5.81)
RDW: 13.5 % (ref 11.5–15.5)
WBC: 16.1 10*3/uL — AB (ref 4.0–10.5)

## 2018-01-23 LAB — BASIC METABOLIC PANEL
ANION GAP: 3 — AB (ref 5–15)
BUN: 11 mg/dL (ref 6–20)
CALCIUM: 10.1 mg/dL (ref 8.9–10.3)
CO2: 26 mmol/L (ref 22–32)
Chloride: 105 mmol/L (ref 98–111)
Creatinine, Ser: 0.87 mg/dL (ref 0.61–1.24)
GFR calc Af Amer: >60 mL/min (ref >60)
GLUCOSE: 102 mg/dL — AB (ref 70–99)
Potassium: 4 mmol/L (ref 3.5–5.1)
Sodium: 134 mmol/L — ABNORMAL LOW (ref 135–145)

## 2018-01-23 LAB — CREATININE, SERUM: Creatinine, Ser: 0.97 mg/dL (ref 0.61–1.24)

## 2018-01-23 NOTE — Plan of Care (Signed)
Pt had no acute events during night. Pt walks to bathroom independently. Abx given.   Problem: Education: Goal: Knowledge of General Education information will improve Description Including pain rating scale, medication(s)/side effects and non-pharmacologic comfort measures Outcome: Progressing   Problem: Health Behavior/Discharge Planning: Goal: Ability to manage health-related needs will improve Outcome: Progressing   Problem: Clinical Measurements: Goal: Ability to maintain clinical measurements within normal limits will improve Outcome: Progressing Goal: Will remain free from infection Outcome: Progressing Goal: Diagnostic test results will improve Outcome: Progressing Goal: Respiratory complications will improve Outcome: Progressing Goal: Cardiovascular complication will be avoided Outcome: Progressing   Problem: Activity: Goal: Risk for activity intolerance will decrease Outcome: Progressing   Problem: Nutrition: Goal: Adequate nutrition will be maintained Outcome: Progressing   Problem: Coping: Goal: Level of anxiety will decrease Outcome: Progressing   Problem: Elimination: Goal: Will not experience complications related to bowel motility Outcome: Progressing Goal: Will not experience complications related to urinary retention Outcome: Progressing   Problem: Pain Managment: Goal: General experience of comfort will improve Outcome: Progressing   Problem: Safety: Goal: Ability to remain free from injury will improve Outcome: Progressing   Problem: Skin Integrity: Goal: Risk for impaired skin integrity will decrease Outcome: Progressing

## 2018-01-23 NOTE — Progress Notes (Signed)
PROGRESS NOTE    Patrick Walter  AYT:[id number] DOB: 21-Mar-1956 DOA: 01/15/2018 PCP: [name], MD   Brief Narrative:  HPI on 01/16/2018 by Dr. Gean Birchwood Patrick Walter is a 62 y.o. male with history of bicuspid aortic valve status post bioprosthetic valve replacement in 2015 and at that time patient also has had PFO closure also receives injection for right hip osteoarthritis last one was last month has had recently traveled to [place] last week for sports event presents to the ER with complaints of having fever and chills with generalized body ache for the last 2 days.  Denies any chest pain headache visual symptoms sore throat nausea vomiting diarrhea or productive cough or shortness of breath.  Since patient has been having persistent symptoms last 2 days patient comes to the ER.  Interim history Patient found to have sepsis secondary to methicillin susceptible Staphylococcus epidermidis.  TEE revealed large vegetation on prosthetic aortic valve.  Cardiology as well as cardiothoracic surgery consulted and following. Assessment & Plan   Sepsis secondary to methicillin susceptible Staphylococcus epidermidis/prosthetic valve endocarditis -Patient had a history of dental work in August for which he took Amoxil.  Also had steroid injection in the hip approximately 1 month ago. -Influenza PCR as well as respiratory panel as well as strep throat were negative -HIV nonreactive -ID consulted and appreciated, patient was initially on Rocephin however transition to Ancef on 01/17/2018 -Patient was transferred to [facility] -TEE showed large vegetation on the prosthetic aortic valve -Blood cultures from 01/17/2018 show no growth to date -Cardiothoracic surgery consulted and appreciated, MRI of the brain as well as CT angiography ordered to rule out presence of subclinical embolization.  General surgery consulted for possible pilonidal  cyst. -MRI obtained: 2 tiny cortical infarct suspected in the left parietal lobe suspicious for sequela of septic emboli -CTA chest/abd/pelvis: Abrupt embolic occlusion of SMA, abrupt embolic occlusion of the distal splenic artery with multiple splenic infarcts, small left renal infarct, 4.2 cm ascending thoracic aortic aneurysm, no evidence of ischemic bowel -Discussed findings with vascular surgery, Dr. Donnetta Hutching, recommended speaking with thoracic surgery regarding possible for anticoagulation.  Feels these findings are all incidental as patient is currently asymptomatic.  Septic emobli -Noted on MRI as above -Discussed with neurology, Dr. Leonel Ramsay, antithrombotics are not indicated -Speech therapy consulted -Currently patient does not need physical or occupational therapy, this may need to be readdressed after valve replacement/surgery  Left hip osteoarthritis -Celebrex on hold  Normocytic anemia -hemoglobin has been stable  -Hemoglobin currently 9.1 -continue to monitor CBC  Pain foramen ovale/asending aortic enlargement/moderate diastolic CHF -Noted on echocardiogram in 2018, per TEE, EF 60% with mild MR and AI -Stable  Pilonidal cyst -As above General surgery consulted and appreciated -s/p excision of chronic pilonidal sinus tract -cleanse daily and PRN after bowel movements, apply dry gauze as needed -surgery signed off  DVT Prophylaxis  Lovenox  Code Status: Full  Family Communication: None at bedside (spoke with wife via phone on 01/22/2018)  Disposition Plan: Admitted. Pending recommendations from cardiothoracic surgery.  Consultants Infectious disease Cardiology Cardiothoracic surgery General surgery Neurology, Dr. Leonel Ramsay, via phone Vascular surgery, Dr. Donnetta Hutching, via phone  Procedures  TEE Excision of chronic pilonidal sinus tract  Antibiotics   Anti-infectives (From admission, onward)   Start     Dose/Rate Route Frequency Ordered Stop   01/22/18 2300   gentamicin (GARAMYCIN) IVPB 80 mg     80 mg 100 mL/hr over  30 Minutes Intravenous Every 12 hours 01/22/18 1115     01/20/18 1900  gentamicin (GARAMYCIN) IVPB 80 mg  Status:  Discontinued     80 mg 100 mL/hr over 30 Minutes Intravenous Every 8 hours 01/20/18 0945 01/22/18 1115   01/20/18 1400  rifampin (RIFADIN) capsule 300 mg  Status:  Discontinued     300 mg Oral Every 8 hours 01/20/18 0945 01/20/18 1000   01/20/18 1200  rifampin (RIFADIN) capsule 300 mg     300 mg Oral 3 times daily with meals 01/20/18 1000     01/20/18 1100  gentamicin (GARAMYCIN) IVPB 120 mg     120 mg 200 mL/hr over 30 Minutes Intravenous  Once 01/20/18 0945 01/20/18 1251   01/17/18 1400  ceFAZolin (ANCEF) IVPB 2g/100 mL premix     2 g 200 mL/hr over 30 Minutes Intravenous Every 8 hours 01/17/18 1156     01/17/18 1030  cefTRIAXone (ROCEPHIN) 2 g in sodium chloride 0.9 % 100 mL IVPB  Status:  Discontinued     2 g 200 mL/hr over 30 Minutes Intravenous Every 24 hours 01/17/18 0919 01/17/18 1156   01/16/18 0800  vancomycin (VANCOCIN) 1,750 mg in sodium chloride 0.9 % 500 mL IVPB  Status:  Discontinued     1,750 mg 250 mL/hr over 120 Minutes Intravenous Every 24 hours 01/16/18 0136 01/17/18 0919   01/16/18 0600  piperacillin-tazobactam (ZOSYN) IVPB 3.375 g  Status:  Discontinued     3.375 g 12.5 mL/hr over 240 Minutes Intravenous Every 8 hours 01/16/18 0136 01/17/18 0918   01/15/18 2130  piperacillin-tazobactam (ZOSYN) IVPB 3.375 g     3.375 g 100 mL/hr over 30 Minutes Intravenous  Once 01/15/18 2123 01/15/18 2211   01/15/18 2130  vancomycin (VANCOCIN) IVPB 1000 mg/200 mL premix     1,000 mg 200 mL/hr over 60 Minutes Intravenous  Once 01/15/18 2123 01/15/18 2313   01/15/18 2130  oseltamivir (TAMIFLU) capsule 75 mg     75 mg Oral  Once 01/15/18 2123 01/15/18 2134      Subjective:   Patrick Walter seen and examined today.  She has no complaints at this time.  Denies current chest pain, shortness of breath,  abdominal pain, nausea or vomiting, diarrhea or constipation. Wonders what the next step will be.  Objective:   Vitals:   01/22/18 1606 01/22/18 1932 01/23/18 0559 01/23/18 1135  BP: 113/73 110/83 118/80 105/73  Pulse: 87 81  89  Resp: 17 16 14  (!) 23  Temp: (!) 100.5 F (38.1 C) 100.1 F (37.8 C) 98.4 F (36.9 C)   TempSrc: Oral Oral Oral   SpO2: 98% 98%  100%  Weight:      Height:        Intake/Output Summary (Last 24 hours) at 01/23/2018 1312 Last data filed at 01/23/2018 1001 Gross per 24 hour  Intake 240 ml  Output -  Net 240 ml   Filed Weights   01/15/18 1923  Weight: 80.7 kg   Exam  General: Well developed, well nourished, NAD, appears stated age  44: NCAT,mucous membranes moist.   Neck: Supple  Cardiovascular: S1 S2 auscultated, RRR, +SEM  Respiratory: Clear to auscultation bilaterally with equal chest rise  Abdomen: Soft, nontender, nondistended, + bowel sounds  Extremities: warm dry without cyanosis clubbing or edema  Neuro: AAOx3, nonfocal  Psych: appropriate mood and affect, pleasant  Data Reviewed: I have personally reviewed following labs and imaging studies  CBC: Recent Labs  Lab 01/17/18 0644  01/18/18 0553 01/20/18 0330 01/22/18 0303 01/23/18 0729  WBC 15.5* 14.8* 14.7* 17.8* 16.1*  NEUTROABS 11.5*  --   --   --   --   HGB 9.0* 8.8* 9.5* 8.8* 9.1*  HCT 29.0* 29.2* 32.3* 29.9* 29.9*  MCV 87.3 87.2 88.0 86.2 85.2  PLT 196 251 356 363 010   Basic Metabolic Panel: Recent Labs  Lab 01/19/18 0603 01/20/18 0330 01/21/18 0432 01/22/18 0303 01/23/18 0418 01/23/18 0729  NA 138 139  --  135  --  134*  K 4.3 4.4  --  4.1  --  4.0  CL 104 105  --  105  --  105  CO2 26 28  --  25  --  26  GLUCOSE 106* 117*  --  100*  --  102*  BUN 12 10  --  15  --  11  CREATININE 0.82 1.03 0.98 0.93 0.97 0.87  CALCIUM 10.4* 10.4*  --  9.5  --  10.1   GFR: Estimated Creatinine Clearance: 96.2 mL/min (by C-G formula based on SCr of 0.87  mg/dL). Liver Function Tests: Recent Labs  Lab 01/20/18 0330  AST 40  ALT 40  ALKPHOS 98  BILITOT 0.6  PROT 6.3*  ALBUMIN 2.8*   No results for input(s): LIPASE, AMYLASE in the last 168 hours. No results for input(s): AMMONIA in the last 168 hours. Coagulation Profile: No results for input(s): INR, PROTIME in the last 168 hours. Cardiac Enzymes: No results for input(s): CKTOTAL, CKMB, CKMBINDEX, TROPONINI in the last 168 hours. BNP (last 3 results) No results for input(s): PROBNP in the last 8760 hours. HbA1C: No results for input(s): HGBA1C in the last 72 hours. CBG: No results for input(s): GLUCAP in the last 168 hours. Lipid Profile: No results for input(s): CHOL, HDL, LDLCALC, TRIG, CHOLHDL, LDLDIRECT in the last 72 hours. Thyroid Function Tests: No results for input(s): TSH, T4TOTAL, FREET4, T3FREE, THYROIDAB in the last 72 hours. Anemia Panel: No results for input(s): VITAMINB12, FOLATE, FERRITIN, TIBC, IRON, RETICCTPCT in the last 72 hours. Urine analysis:    Component Value Date/Time   COLORURINE YELLOW 01/15/2018 2214   APPEARANCEUR CLEAR 01/15/2018 2214   LABSPEC 1.010 01/15/2018 2214   PHURINE 7.0 01/15/2018 2214   GLUCOSEU NEGATIVE 01/15/2018 2214   HGBUR NEGATIVE 01/15/2018 2214   BILIRUBINUR NEGATIVE 01/15/2018 2214   KETONESUR NEGATIVE 01/15/2018 2214   PROTEINUR NEGATIVE 01/15/2018 2214   UROBILINOGEN 0.2 12/03/2013 1344   NITRITE NEGATIVE 01/15/2018 2214   LEUKOCYTESUR NEGATIVE 01/15/2018 2214   Sepsis Labs: @LABRCNTIP (procalcitonin:4,lacticidven:4)  ) Recent Results (from the past 240 hour(s))  Culture, blood (Routine X 2) w Reflex to ID Panel     Status: Abnormal   Collection Time: 01/15/18  8:15 PM  Result Value Ref Range Status   Specimen Description   Final    BLOOD RIGHT HAND Performed at [facility], [address] [place], [place] [address]    Special Requests   Final    BOTTLES DRAWN AEROBIC AND ANAEROBIC Blood Culture  adequate volume Performed at [facility], [address] [place], [place] [address]    Culture  Setup Time   Final    IN BOTH AEROBIC AND ANAEROBIC BOTTLES GRAM POSITIVE COCCI CRITICAL VALUE NOTED.  VALUE IS CONSISTENT WITH PREVIOUSLY REPORTED AND CALLED VALUE.    Culture (A)  Final    STAPHYLOCOCCUS EPIDERMIDIS SUSCEPTIBILITIES PERFORMED ON PREVIOUS CULTURE WITHIN THE LAST 5 DAYS. Performed at  [facility], [place] [address], [place], [place] [address]    Report Status 01/19/2018 FINAL  Final  Culture, blood (Routine X 2) w Reflex to ID Panel     Status: Abnormal   Collection Time: 01/15/18  8:15 PM  Result Value Ref Range Status   Specimen Description   Final    BLOOD RIGHT ARM Performed at [facility], [address] [place], [place] [address]    Special Requests   Final    BOTTLES DRAWN AEROBIC AND ANAEROBIC Blood Culture adequate volume Performed at [facility], [address] [place], [place] [address]    Culture  Setup Time   Final    GRAM POSITIVE COCCI IN BOTH AEROBIC AND ANAEROBIC BOTTLES CRITICAL RESULT CALLED TO, READ BACK BY AND VERIFIED WITH: Sheffield Slider [facility] 01/16/18 1937 JDW    Culture STAPHYLOCOCCUS EPIDERMIDIS (A)  Final   Report Status 01/18/2018 FINAL  Final   Organism ID, Bacteria STAPHYLOCOCCUS EPIDERMIDIS  Final      Susceptibility   Staphylococcus epidermidis - MIC*    CIPROFLOXACIN <=0.5 SENSITIVE Sensitive     ERYTHROMYCIN <=0.25 SENSITIVE Sensitive     GENTAMICIN <=0.5 SENSITIVE Sensitive     OXACILLIN <=0.25 SENSITIVE Sensitive     TETRACYCLINE <=1 SENSITIVE Sensitive     VANCOMYCIN 1 SENSITIVE Sensitive     TRIMETH/SULFA <=10 SENSITIVE Sensitive     CLINDAMYCIN <=0.25 SENSITIVE Sensitive     RIFAMPIN <=0.5 SENSITIVE Sensitive     Inducible Clindamycin NEGATIVE Sensitive     * STAPHYLOCOCCUS EPIDERMIDIS  Blood Culture ID Panel (Reflexed)     Status: Abnormal   Collection Time: 01/15/18  8:15 PM   Result Value Ref Range Status   Enterococcus species NOT DETECTED NOT DETECTED Final   Listeria monocytogenes NOT DETECTED NOT DETECTED Final   Staphylococcus species DETECTED (A) NOT DETECTED Final    Comment: Methicillin (oxacillin) susceptible coagulase negative staphylococcus. Possible blood culture contaminant (unless isolated from more than one blood culture draw or clinical case suggests pathogenicity). No antibiotic treatment is indicated for blood  culture contaminants. CRITICAL RESULT CALLED TO, READ BACK BY AND VERIFIED WITH: Henry Russel [facility] 01/16/18 1937 JDW    Staphylococcus aureus (BCID) NOT DETECTED NOT DETECTED Final   Methicillin resistance NOT DETECTED NOT DETECTED Final   Streptococcus species NOT DETECTED NOT DETECTED Final   Streptococcus agalactiae NOT DETECTED NOT DETECTED Final   Streptococcus pneumoniae NOT DETECTED NOT DETECTED Final   Streptococcus pyogenes NOT DETECTED NOT DETECTED Final   Acinetobacter baumannii NOT DETECTED NOT DETECTED Final   Enterobacteriaceae species NOT DETECTED NOT DETECTED Final   Enterobacter cloacae complex NOT DETECTED NOT DETECTED Final   Escherichia coli NOT DETECTED NOT DETECTED Final   Klebsiella oxytoca NOT DETECTED NOT DETECTED Final   Klebsiella pneumoniae NOT DETECTED NOT DETECTED Final   Proteus species NOT DETECTED NOT DETECTED Final   Serratia marcescens NOT DETECTED NOT DETECTED Final   Haemophilus influenzae NOT DETECTED NOT DETECTED Final   Neisseria meningitidis NOT DETECTED NOT DETECTED Final   Pseudomonas aeruginosa NOT DETECTED NOT DETECTED Final   Candida albicans NOT DETECTED NOT DETECTED Final   Candida glabrata NOT DETECTED NOT DETECTED Final   Candida krusei NOT DETECTED NOT DETECTED Final   Candida parapsilosis NOT DETECTED NOT DETECTED Final   Candida tropicalis NOT DETECTED NOT DETECTED Final    Comment: Performed at [facility], [place]. [address], [place], [place] [address]  Group A Strep by  PCR     Status: None   Collection Time: 01/15/18  8:19 PM  Result Value Ref Range Status   Group A Strep by PCR NOT DETECTED NOT DETECTED Final    Comment: Performed at [facility], [address] [place], [place] [address]  Respiratory Panel by PCR     Status: None   Collection Time: 01/16/18  7:52 AM  Result Value Ref Range Status   Adenovirus NOT DETECTED NOT DETECTED Final   Coronavirus 229E NOT DETECTED NOT DETECTED Final   Coronavirus HKU1 NOT DETECTED NOT DETECTED Final   Coronavirus NL63 NOT DETECTED NOT DETECTED Final   Coronavirus OC43 NOT DETECTED NOT DETECTED Final   Metapneumovirus NOT DETECTED NOT DETECTED Final   Rhinovirus / Enterovirus NOT DETECTED NOT DETECTED Final   Influenza A NOT DETECTED NOT DETECTED Final   Influenza B NOT DETECTED NOT DETECTED Final   Parainfluenza Virus 1 NOT DETECTED NOT DETECTED Final   Parainfluenza Virus 2 NOT DETECTED NOT DETECTED Final   Parainfluenza Virus 3 NOT DETECTED NOT DETECTED Final   Parainfluenza Virus 4 NOT DETECTED NOT DETECTED Final   Respiratory Syncytial Virus NOT DETECTED NOT DETECTED Final   Bordetella pertussis NOT DETECTED NOT DETECTED Final   Chlamydophila pneumoniae NOT DETECTED NOT DETECTED Final   Mycoplasma pneumoniae NOT DETECTED NOT DETECTED Final  MRSA PCR Screening     Status: None   Collection Time: 01/16/18 11:08 AM  Result Value Ref Range Status   MRSA by PCR NEGATIVE NEGATIVE Final    Comment:        The GeneXpert MRSA Assay (FDA approved for NASAL specimens only), is one component of a comprehensive MRSA colonization surveillance program. It is not intended to diagnose MRSA infection nor to guide or monitor treatment for MRSA infections. Performed at [facility], [place] [address] [place], [place] [address]   Culture, blood (Routine X 2) w Reflex to ID Panel     Status: None   Collection Time: 01/17/18 12:57 PM  Result Value Ref Range Status   Specimen  Description   Final    BLOOD LEFT ARM Performed at [facility] [address], [place], [place] [address]    Special Requests   Final    BOTTLES DRAWN AEROBIC AND ANAEROBIC Blood Culture adequate volume   Culture   Final    NO GROWTH 5 DAYS Performed at [facility], [place] [address], [place], [place] [address]    Report Status 01/22/2018 FINAL  Final      Radiology Studies: Dg Chest 2 View  Result Date: 01/21/2018 CLINICAL DATA:  Cough EXAM: CHEST - 2 VIEW COMPARISON:  CT of the chest from earlier in the same day. Chest x-ray from 01/15/2018 FINDINGS: Cardiac shadow is stable. Postoperative changes are again seen. The lungs are well aerated bilaterally. No acute infiltrate or sizable effusion is seen. No bony abnormality is noted. IMPRESSION: No acute abnormality noted. Electronically Signed   By: Inez Catalina M.D.   On: 01/21/2018 19:38   Ct Angio Chest/abd/pel For Dissection W And/or W/wo  Result Date: 01/21/2018 CLINICAL DATA:  Chest and back pain. Prosthetic valve endocarditis. Evaluate for emboli. EXAM: CT ANGIOGRAPHY CHEST, ABDOMEN AND PELVIS TECHNIQUE: Multidetector CT imaging through the chest, abdomen and pelvis was performed using the standard protocol during bolus administration of intravenous contrast. Multiplanar reconstructed images and MIPs were obtained and reviewed to evaluate the vascular  anatomy. CONTRAST:  17mL ISOVUE-370 IOPAMIDOL (ISOVUE-370) INJECTION 76% COMPARISON:  Cardiac CT dated November 30, 2016. CT chest dated December 15, 2015. FINDINGS: CTA CHEST FINDINGS Cardiovascular: Unchanged aneurysmal dilatation of the ascending thoracic aorta, measuring up to 4.2 cm. No aortic dissection. No intramural hematoma on noncontrast images. No pulmonary embolism. Stable mild cardiomegaly. No pericardial effusion. Prior aortic valve replacement. Coronary artery atherosclerosis. Mediastinum/Nodes: No enlarged mediastinal, hilar, or axillary lymph  nodes. Thyroid gland, trachea, and esophagus demonstrate no significant findings. Lungs/Pleura: Lungs are clear. No pleural effusion or pneumothorax. Musculoskeletal: No chest wall abnormality. No acute or significant osseous findings. Review of the MIP images confirms the above findings. CTA ABDOMEN AND PELVIS FINDINGS VASCULAR Aorta: Normal caliber aorta without aneurysm, dissection, vasculitis or significant stenosis. Celiac: Patent without evidence of aneurysm, dissection, vasculitis or significant stenosis. Abrupt occlusion of the distal splenic artery (series 7, image 162). Replaced left hepatic artery arising from the left gastric artery. SMA: Posterior luminal irregularity of the mid SMA. Abrupt occlusion of the SMA just distal to the origin of the right colic artery (series 11, image 122). Renals: Both renal arteries are patent without evidence of aneurysm, dissection, vasculitis, fibromuscular dysplasia or significant stenosis. IMA: Patent without evidence of aneurysm, dissection, vasculitis or significant stenosis. Inflow: Patent without evidence of aneurysm, dissection, vasculitis or significant stenosis. Veins: Possible occluded branch of the SMV (series 7, image 200), although evaluation is limited on this arterial phase study. Review of the MIP images confirms the above findings. NON-VASCULAR Hepatobiliary: No focal liver abnormality is seen. Gallbladder is contracted. No gallstones or biliary dilatation. Pancreas: Unremarkable. No pancreatic ductal dilatation or surrounding inflammatory changes. Spleen: Multiple wedge-shaped areas of hypoenhancement within the spleen, consistent with infarcts. Adrenals/Urinary Tract: The adrenal glands are unremarkable. Wedge-shaped hypodensity in the peripheral mid pole of the left kidney. Small nonobstructive calculus in the lower pole of the left kidney. 2.3 cm exophytic left renal simple cyst. Small chronic cortical defect in the upper pole of the right kidney.  The right kidney is otherwise unremarkable. No hydronephrosis. The bladder is unremarkable. Stomach/Bowel: Stomach is within normal limits. Appendix appears normal. No evidence of bowel wall thickening, distention, or inflammatory changes. Redundant sigmoid colon. Lymphatic: No enlarged abdominal or pelvic lymph nodes. Reproductive: Prostate is unremarkable. Other: 3.0 x 1.8 cm area of ill-defined mesenteric soft tissue density in the mid abdomen just distal to the occluded distal SMA. No ascites or pneumoperitoneum. Musculoskeletal: No acute or significant osseous findings. Review of the MIP images confirms the above findings. IMPRESSION: Vascular: 1. 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 Vascular surgery consultation is recommended. 2. Posterior luminal irregularity of the mid SMA could reflect nonocclusive emboli or atherosclerotic plaque. 3. 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 4. Small left renal infarct. 5. Possibly occluded branch of the SMV, although evaluation is limited on this arterial phase study. Consider portal venous phase CT of the abdomen and pelvis for further evaluation. 6. Unchanged 4.2 cm ascending thoracic aortic aneurysm. Recommend annual imaging followup by CTA or MRA. This recommendation follows 2010 ACCF/AHA/AATS/ACR/ASA/SCA/SCAI/SIR/STS/SVM Guidelines for the Diagnosis and Management of Patients with Thoracic Aortic Disease. Circulation. 2010; 121: P102-H852 Non-vascular: 1. No evidence of ischemic bowel. 2. 3.0 x 1.8 cm area of ill-defined mesenteric soft tissue density in the mid abdomen near the occluded distal SMA branches may be inflammatory related to adjacent vessel occlusion. 3. Nonobstructive left nephrolithiasis. 4. No acute intrathoracic  process. Critical Value/emergent results were called by telephone at the time of interpretation on 01/21/2018 at 5:03 pm to Dr. Cristal Ford, who  verbally acknowledged these results. Electronically Signed   By: Titus Dubin M.D.   On: 01/21/2018 17:07     Scheduled Meds: . benzonatate  200 mg Oral TID  . enoxaparin (LOVENOX) injection  40 mg Subcutaneous QHS  . rifampin  300 mg Oral TID WC   Continuous Infusions: . sodium chloride 250 mL (01/19/18 0612)  .  ceFAZolin (ANCEF) IV 2 g (01/23/18 0935)  . gentamicin 80 mg (01/23/18 1226)     LOS: 8 days   Time Spent in minutes   45 minutes (greater than 50% of time spent with patient face to face, as well as reviewing records, calling consults, and formulating a plan)   Cristal Ford D.O. on 01/23/2018 at 1:12 PM  Between 7am to 7pm - Please see pager noted on [email or web address]  After 7pm go to [email or web address]  And look for the night coverage person covering for me after hours  [facility] Office  [phone]

## 2018-01-23 NOTE — Progress Notes (Signed)
Patient refused 22:00 dose of Lovenox. Pt stated "I don't want that."

## 2018-01-23 NOTE — Progress Notes (Addendum)
[address]       [place] [address]             [phone]     CARDIOTHORACIC SURGERY PROGRESS NOTE  4 Days Post-Op  S/P Procedure(s) (LRB): TRANSESOPHAGEAL ECHOCARDIOGRAM (TEE) (N/A)  Subjective: Reports feeling better.   Specifically no SOB, chest pain, abdominal pain, or transient neurologic symptoms.  Objective: Vital signs in last 24 hours: Temp:  [98.4 F (36.9 C)-100.1 F (37.8 C)] 98.4 F (36.9 C) (11/04 0559) Pulse Rate:  [81-89] 89 (11/04 1135) Cardiac Rhythm: Heart block;Normal sinus rhythm (11/04 0935) Resp:  [14-23] 23 (11/04 1135) BP: (105-118)/(73-83) 105/73 (11/04 1135) SpO2:  [98 %-100 %] 100 % (11/04 1135)  Physical Exam:  Rhythm:   sinus  Breath sounds: clear  Heart sounds:  RRR  Incisions:  n/a  Abdomen:  Soft, non-distended, non-tender  Extremities:  Warm, well-perfused   Intake/Output from previous day: 11/03 0701 - 11/04 0700 In: 720 [P.O.:720] Out: -  Intake/Output this shift: Total I/O In: 240 [P.O.:240] Out: -   Lab Results: Recent Labs    01/22/18 0303 01/23/18 0729  WBC 17.8* 16.1*  HGB 8.8* 9.1*  HCT 29.9* 29.9*  PLT 363 368   BMET:  Recent Labs    01/22/18 0303 01/23/18 0418 01/23/18 0729  NA 135  --  134*  K 4.1  --  4.0  CL 105  --  105  CO2 25  --  26  GLUCOSE 100*  --  102*  BUN 15  --  11  CREATININE 0.93 0.97 0.87  CALCIUM 9.5  --  10.1    CBG (last 3)  No results for input(s): GLUCAP in the last 72 hours. PT/INR:  No results for input(s): LABPROT, INR in the last 72 hours.  CXR:  CHEST - 2 VIEW  COMPARISON:  CT of the chest from earlier in the same day. Chest x-ray from 01/15/2018  FINDINGS: Cardiac shadow is stable. Postoperative changes are again seen. The lungs are well aerated bilaterally. No acute infiltrate or sizable effusion is seen. No bony abnormality is noted.  IMPRESSION: No acute abnormality noted.   Electronically Signed   By: Inez Catalina M.D.  On: 01/21/2018 19:38   MRI HEAD WITHOUT AND WITH CONTRAST  TECHNIQUE: Multiplanar, multiecho pulse sequences of the brain and surrounding structures were obtained without and with intravenous contrast.  CONTRAST:  8 milliliters Gadavist.  COMPARISON:  No prior brain imaging.  FINDINGS: Brain: There are 2 small cortical foci of restricted diffusion in the left parietal lobe, that on series 3, image 36 is most apparent. There is minimal associated T2 and FLAIR hyperintensity. No associated hemorrhage or hemosiderin.  However, there is an associated small focus of leptomeningeal enhancement best seen on series 13, image 10) matter near the infarct site. No masslike enhancement.  No other No abnormal enhancement or dural thickening identified.  No other restricted diffusion. No midline shift, mass effect, evidence of mass lesion, ventriculomegaly, extra-axial fluid collection or acute intracranial hemorrhage. Cervicomedullary junction and pituitary are within normal limits.  Incidental left ambient cistern intracranial lipoma measuring about 15 millimeters (series 5, image 15).  Outside of the acute findings largely normal for age gray and white matter signal; minimal nonspecific white matter T2 and FLAIR hyperintensity. The deep gray matter nuclei, brainstem, and cerebellum appear normal.  Vascular: Major intracranial vascular flow voids are preserved. The major dural venous sinuses are enhancing and appear patent.  Skull and upper cervical spine: Visualized bone marrow signal is within normal limits. Negative visible cervical spine.  Sinuses/Orbits: Normal orbits soft tissues. Paranasal sinuses are clear.  Other: Mastoid air cells are clear. Visible internal auditory structures appear normal.  Scalp and face soft tissues appear negative.  IMPRESSION: 1. Two tiny cortical infarcts suspected in the left parietal lobe. This is suspicious for sequelae of  Septic Emboli given associated mild regional leptomeningeal enhancement, which might reflect a mild associated Cerebritis. But there is no associated hemorrhage or mass effect.  2. No other acute intracranial abnormality, and otherwise negative for age MRI appearance of the brain .  3. 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   Electronically Signed   By: Patrick Ann M.D.   On: 01/20/2018 16:34   CT ANGIOGRAPHY CHEST, ABDOMEN AND PELVIS  TECHNIQUE: Multidetector CT imaging through the chest, abdomen and pelvis was performed using the standard protocol during bolus administration of intravenous contrast. Multiplanar reconstructed images and MIPs were obtained and reviewed to evaluate the vascular anatomy.  CONTRAST:  145mL ISOVUE-370 IOPAMIDOL (ISOVUE-370) INJECTION 76%  COMPARISON:  Cardiac CT dated November 30, 2016. CT chest dated December 15, 2015.  FINDINGS: CTA CHEST FINDINGS  Cardiovascular: Unchanged aneurysmal dilatation of the ascending thoracic aorta, measuring up to 4.2 cm. No aortic dissection. No intramural hematoma on noncontrast images. No pulmonary embolism. Stable mild cardiomegaly. No pericardial effusion. Prior aortic valve replacement. Coronary artery atherosclerosis.  Mediastinum/Nodes: No enlarged mediastinal, hilar, or axillary lymph nodes. Thyroid gland, trachea, and esophagus demonstrate no significant findings.  Lungs/Pleura: Lungs are clear. No pleural effusion or pneumothorax.  Musculoskeletal: No chest wall abnormality. No acute or significant osseous findings.  Review of the MIP images confirms the above findings.  CTA ABDOMEN AND PELVIS FINDINGS  VASCULAR  Aorta: Normal caliber aorta without aneurysm, dissection, vasculitis or significant stenosis.  Celiac: Patent without evidence of aneurysm, dissection, vasculitis or significant stenosis. Abrupt occlusion of the distal splenic artery (series  7, image 162). Replaced left hepatic artery arising from the left gastric artery.  SMA: Posterior luminal irregularity of the mid SMA. Abrupt occlusion of the SMA just distal to the origin of the right colic artery (series 11, image 122).   Renals: Both renal arteries are patent without evidence of aneurysm, dissection, vasculitis, fibromuscular dysplasia or significant stenosis.  IMA: Patent without evidence of aneurysm, dissection, vasculitis or significant stenosis.  Inflow: Patent without evidence of aneurysm, dissection, vasculitis or significant stenosis.  Veins: Possible occluded branch of the SMV (series 7, image 200), although evaluation is limited on this arterial phase study.  Review of the MIP images confirms the above findings.  NON-VASCULAR  Hepatobiliary: No focal liver abnormality is seen. Gallbladder is contracted. No gallstones or biliary dilatation.  Pancreas: Unremarkable. No pancreatic ductal dilatation or surrounding inflammatory changes.  Spleen: Multiple wedge-shaped areas of hypoenhancement within the spleen, consistent with infarcts.  Adrenals/Urinary Tract: The adrenal glands are unremarkable. Wedge-shaped hypodensity in the peripheral mid pole of the left kidney. Small nonobstructive calculus in the lower pole of the left kidney. 2.3 cm exophytic left renal simple cyst. Small chronic cortical defect in the upper pole of the right kidney. The right kidney is otherwise unremarkable. No hydronephrosis. The bladder is unremarkable.  Stomach/Bowel: Stomach is within normal limits. Appendix appears normal. No evidence of bowel wall thickening, distention, or inflammatory changes. Redundant sigmoid colon.  Lymphatic: No enlarged abdominal or pelvic lymph nodes.  Reproductive: Prostate is unremarkable.  Other: 3.0 x  1.8 cm area of ill-defined mesenteric soft tissue density in the mid abdomen just distal to the occluded distal  SMA. No ascites or pneumoperitoneum.  Musculoskeletal: No acute or significant osseous findings.  Review of the MIP images confirms the above findings.  IMPRESSION: Vascular:  1. 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 Vascular surgery consultation is recommended. 2. Posterior luminal irregularity of the mid SMA could reflect nonocclusive emboli or atherosclerotic plaque. 3. 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 4. Small left renal infarct. 5. Possibly occluded branch of the SMV, although evaluation is limited on this arterial phase study. Consider portal venous phase CT of the abdomen and pelvis for further evaluation. 6. Unchanged 4.2 cm ascending thoracic aortic aneurysm. Recommend annual imaging followup by CTA or MRA. This recommendation follows 2010 ACCF/AHA/AATS/ACR/ASA/SCA/SCAI/SIR/STS/SVM Guidelines for the Diagnosis and Management of Patients with Thoracic Aortic Disease. Circulation. 2010; 121: B343-H686  Non-vascular:  1. No evidence of ischemic bowel. 2. 3.0 x 1.8 cm area of ill-defined mesenteric soft tissue density in the mid abdomen near the occluded distal SMA branches may be inflammatory related to adjacent vessel occlusion. 3. Nonobstructive left nephrolithiasis. 4. No acute intrathoracic process.  Critical Value/emergent results were called by telephone at the time of interpretation on 01/21/2018 at 5:03 pm to Dr. Cristal Ford, who verbally acknowledged these results.   Electronically Signed   By: Titus Dubin M.D.   On: 01/21/2018 17:07    Assessment/Plan: S/P Procedure(s) (LRB): TRANSESOPHAGEAL ECHOCARDIOGRAM (TEE) (N/A)  Patrick Walter remains clinically stable and reports feeling better.  He has still had some intermittent low grade fever and WBC remains elevated.  No signs of CHF.  MRI of brain and CT scans reveal evidence of subclinical  septic embolization.  Pilonidal cyst has been treated. There are no contraindications for proceeding with definitive surgical treatment, and given the significant risk of additional embolization I favor proceeding with surgery sooner rather than later.  We tentatively plan redo AVR on Thursday 11/7.  There are no indications for systemic anticoagulation.   I spent in excess of 15 minutes during the conduct of this hospital encounter and >50% of this time involved direct face-to-face encounter with the patient for counseling and/or coordination of their care.    [name], MD 01/23/2018

## 2018-01-23 NOTE — Progress Notes (Signed)
CARDIAC REHAB PHASE I   Offered to walk pt earlier today, pt declined stating he had just gotten out of the shower and would walk later. Checked in on pt, pt asleep. Pt has been ambulating in halls independently. Will continue to follow.  Rufina Falco, RN BSN 01/23/2018 1:40 PM

## 2018-01-24 ENCOUNTER — Inpatient Hospital Stay (HOSPITAL_COMMUNITY): Payer: 59

## 2018-01-24 DIAGNOSIS — I669 Occlusion and stenosis of unspecified cerebral artery: Secondary | ICD-10-CM

## 2018-01-24 DIAGNOSIS — Z01810 Encounter for preprocedural cardiovascular examination: Secondary | ICD-10-CM

## 2018-01-24 DIAGNOSIS — I76 Septic arterial embolism: Secondary | ICD-10-CM

## 2018-01-24 LAB — CREATININE, SERUM
CREATININE: 0.96 mg/dL (ref 0.61–1.24)
GFR calc Af Amer: >60 mL/min (ref >60)
GFR calc non Af Amer: >60 mL/min (ref >60)

## 2018-01-24 LAB — URINALYSIS, COMPLETE (UACMP) WITH MICROSCOPIC
BILIRUBIN URINE: NEGATIVE
Bacteria, UA: NONE SEEN
Glucose, UA: NEGATIVE mg/dL
HGB URINE DIPSTICK: NEGATIVE
KETONES UR: NEGATIVE mg/dL
Leukocytes, UA: NEGATIVE
NITRITE: NEGATIVE
PROTEIN: NEGATIVE mg/dL
Specific Gravity, Urine: 1.006 (ref 1.005–1.030)
pH: 6 (ref 5.0–8.0)

## 2018-01-24 NOTE — Progress Notes (Signed)
Pre-AVR testing has been completed. 1-39% ICA stenosis bilaterally.  Palmar waveforms Right Palmar waveforms are obliterated with radial and ulnar compression Left Palmar waveforms are obliterated with radial and ulnar compression  01/24/18 9:55 AM Patrick Walter RVT

## 2018-01-24 NOTE — Progress Notes (Signed)
Patrick Walter for Infectious Disease   Reason for visit: Follow up on PV endocarditis  Interval History: no new complaints, afebrile, creat wnl.  No associated rash, diarrhea.  No sob.   Physical Exam: Constitutional:  Vitals:   01/24/18 0517 01/24/18 0808  BP: 102/72 106/74  Pulse: 78 87  Resp: (!) 28   Temp: 98 F (36.7 C) 98.6 F (37 C)  SpO2: 97%    patient appears in NAD Respiratory: Normal respiratory effort; CTA B Cardiovascular: RRR GI: soft, nt, nd  Review of Systems: Constitutional: negative for fevers and chills Gastrointestinal: negative for diarrhea Integument/breast: negative for rash  Lab Results  Component Value Date   WBC 16.1 (H) 01/23/2018   HGB 9.1 (L) 01/23/2018   HCT 29.9 (L) 01/23/2018   MCV 85.2 01/23/2018   PLT 368 01/23/2018    Lab Results  Component Value Date   CREATININE 0.96 01/24/2018   BUN 11 01/23/2018   NA 134 (L) 01/23/2018   K 4.0 01/23/2018   CL 105 01/23/2018   CO2 26 01/23/2018    Lab Results  Component Value Date   ALT 40 01/20/2018   AST 40 01/20/2018   ALKPHOS 98 01/20/2018     Microbiology: Recent Results (from the past 240 hour(s))  Culture, blood (Routine X 2) w Reflex to ID Panel     Status: Abnormal   Collection Time: 01/15/18  8:15 PM  Result Value Ref Range Status   Specimen Description   Final    BLOOD RIGHT HAND Performed at [facility], [address] [place], [place] [address]    Special Requests   Final    BOTTLES DRAWN AEROBIC AND ANAEROBIC Blood Culture adequate volume Performed at [facility], [address] [place], [place] [address]    Culture  Setup Time   Final    IN BOTH AEROBIC AND ANAEROBIC BOTTLES GRAM POSITIVE COCCI CRITICAL VALUE NOTED.  VALUE IS CONSISTENT WITH PREVIOUSLY REPORTED AND CALLED VALUE.    Culture (A)  Final    STAPHYLOCOCCUS EPIDERMIDIS SUSCEPTIBILITIES PERFORMED ON PREVIOUS CULTURE WITHIN THE LAST 5 DAYS. Performed at [facility], [place] [address] [place], [place] [address]    Report Status 01/19/2018 FINAL  Final  Culture, blood (Routine X 2) w Reflex to ID Panel     Status: Abnormal   Collection Time: 01/15/18  8:15 PM  Result Value Ref Range Status   Specimen Description   Final    BLOOD RIGHT ARM Performed at [facility], [address] [place], [place] [address]    Special Requests   Final    BOTTLES DRAWN AEROBIC AND ANAEROBIC Blood Culture adequate volume Performed at [facility], [address] [place], [place] [address]    Culture  Setup Time   Final    GRAM POSITIVE COCCI IN BOTH AEROBIC AND ANAEROBIC BOTTLES CRITICAL RESULT CALLED TO, READ BACK BY AND VERIFIED WITH: Sheffield Slider [facility] 01/16/18 1937 JDW    Culture STAPHYLOCOCCUS EPIDERMIDIS (A)  Final   Report Status 01/18/2018 FINAL  Final   Organism ID, Bacteria STAPHYLOCOCCUS EPIDERMIDIS  Final      Susceptibility   Staphylococcus epidermidis - MIC*    CIPROFLOXACIN <=0.5 SENSITIVE Sensitive     ERYTHROMYCIN <=0.25 SENSITIVE Sensitive     GENTAMICIN <=0.5 SENSITIVE Sensitive     OXACILLIN <=0.25 SENSITIVE Sensitive     TETRACYCLINE <=1 SENSITIVE Sensitive  VANCOMYCIN 1 SENSITIVE Sensitive     TRIMETH/SULFA <=10 SENSITIVE Sensitive     CLINDAMYCIN <=0.25 SENSITIVE Sensitive     RIFAMPIN <=0.5 SENSITIVE Sensitive     Inducible Clindamycin NEGATIVE Sensitive     * STAPHYLOCOCCUS EPIDERMIDIS  Blood Culture ID Panel (Reflexed)     Status: Abnormal   Collection Time: 01/15/18  8:15 PM  Result Value Ref Range Status   Enterococcus species NOT DETECTED NOT DETECTED Final   Listeria monocytogenes NOT DETECTED NOT DETECTED Final   Staphylococcus species DETECTED (A) NOT DETECTED Final    Comment: Methicillin (oxacillin) susceptible coagulase negative staphylococcus. Possible blood culture contaminant (unless isolated from more than one blood culture draw or clinical case suggests pathogenicity). No antibiotic  treatment is indicated for blood  culture contaminants. CRITICAL RESULT CALLED TO, READ BACK BY AND VERIFIED WITH: Henry Russel [facility] 01/16/18 1937 JDW    Staphylococcus aureus (BCID) NOT DETECTED NOT DETECTED Final   Methicillin resistance NOT DETECTED NOT DETECTED Final   Streptococcus species NOT DETECTED NOT DETECTED Final   Streptococcus agalactiae NOT DETECTED NOT DETECTED Final   Streptococcus pneumoniae NOT DETECTED NOT DETECTED Final   Streptococcus pyogenes NOT DETECTED NOT DETECTED Final   Acinetobacter baumannii NOT DETECTED NOT DETECTED Final   Enterobacteriaceae species NOT DETECTED NOT DETECTED Final   Enterobacter cloacae complex NOT DETECTED NOT DETECTED Final   Escherichia coli NOT DETECTED NOT DETECTED Final   Klebsiella oxytoca NOT DETECTED NOT DETECTED Final   Klebsiella pneumoniae NOT DETECTED NOT DETECTED Final   Proteus species NOT DETECTED NOT DETECTED Final   Serratia marcescens NOT DETECTED NOT DETECTED Final   Haemophilus influenzae NOT DETECTED NOT DETECTED Final   Neisseria meningitidis NOT DETECTED NOT DETECTED Final   Pseudomonas aeruginosa NOT DETECTED NOT DETECTED Final   Candida albicans NOT DETECTED NOT DETECTED Final   Candida glabrata NOT DETECTED NOT DETECTED Final   Candida krusei NOT DETECTED NOT DETECTED Final   Candida parapsilosis NOT DETECTED NOT DETECTED Final   Candida tropicalis NOT DETECTED NOT DETECTED Final    Comment: Performed at [facility], [place]. [address] [place], [place] [address]  Group A Strep by PCR     Status: None   Collection Time: 01/15/18  8:19 PM  Result Value Ref Range Status   Group A Strep by PCR NOT DETECTED NOT DETECTED Final    Comment: Performed at [facility], [address] [place], [place] [address]  Respiratory Panel by PCR     Status: None   Collection Time: 01/16/18  7:52 AM  Result Value Ref Range Status   Adenovirus NOT DETECTED NOT DETECTED Final   Coronavirus 229E NOT DETECTED  NOT DETECTED Final   Coronavirus HKU1 NOT DETECTED NOT DETECTED Final   Coronavirus NL63 NOT DETECTED NOT DETECTED Final   Coronavirus OC43 NOT DETECTED NOT DETECTED Final   Metapneumovirus NOT DETECTED NOT DETECTED Final   Rhinovirus / Enterovirus NOT DETECTED NOT DETECTED Final   Influenza A NOT DETECTED NOT DETECTED Final   Influenza B NOT DETECTED NOT DETECTED Final   Parainfluenza Virus 1 NOT DETECTED NOT DETECTED Final   Parainfluenza Virus 2 NOT DETECTED NOT DETECTED Final   Parainfluenza Virus 3 NOT DETECTED NOT DETECTED Final   Parainfluenza Virus 4 NOT DETECTED NOT DETECTED Final   Respiratory Syncytial Virus NOT DETECTED NOT DETECTED Final   Bordetella pertussis NOT DETECTED NOT DETECTED Final   Chlamydophila pneumoniae NOT DETECTED NOT DETECTED Final  Mycoplasma pneumoniae NOT DETECTED NOT DETECTED Final  MRSA PCR Screening     Status: None   Collection Time: 01/16/18 11:08 AM  Result Value Ref Range Status   MRSA by PCR NEGATIVE NEGATIVE Final    Comment:        The GeneXpert MRSA Assay (FDA approved for NASAL specimens only), is one component of a comprehensive MRSA colonization surveillance program. It is not intended to diagnose MRSA infection nor to guide or monitor treatment for MRSA infections. Performed at [facility], [place] [address] [place], [place] [address]   Culture, blood (Routine X 2) w Reflex to ID Panel     Status: None   Collection Time: 01/17/18 12:57 PM  Result Value Ref Range Status   Specimen Description   Final    BLOOD LEFT ARM Performed at [facility] [address], [place], [place] [address]    Special Requests   Final    BOTTLES DRAWN AEROBIC AND ANAEROBIC Blood Culture adequate volume   Culture   Final    NO GROWTH 5 DAYS Performed at [facility], [place] [address] [place], [place] [address]    Report Status 01/22/2018 FINAL  Final    Impression/Plan:  1. PV endocarditis - MSSE; on  cefazolin, gentamicin and rifampin.  Will need a prolonged course after surgery.  2. Medication monitoring - creat has remained stable. Continue to monitor  3.  Septic emboli - noted on MRI of brain.  Asymptomatic.    Will see again after surgery.

## 2018-01-24 NOTE — Progress Notes (Signed)
TCTS BRIEF PROGRESS NOTE  5 Days Post-Op  S/P Procedure(s) (LRB): TRANSESOPHAGEAL ECHOCARDIOGRAM (TEE) (N/A)   Clinically stable  Plan: Tentatively for OR on Thursday 11/7  [name], MD 01/24/2018 4:46 PM

## 2018-01-24 NOTE — Progress Notes (Signed)
CARDIAC REHAB PHASE I    Offered to walk with pt, pt declining at this time. Preop ed done with pt and family. Pt given IS, demonstrated >2500. Encouraged use before and after surgery. Pt instructed on importance of sternal precautions. Cardiac Surgery care book given. Pt encouraged to continue with ambulation pre-op to prevent clots, especially since he has been declining VTE. Also stressed importance of ambulation post-op. Pt and family deny questions or concerns. Will continue to follow.  [id number] Rufina Falco, RN BSN 01/24/2018 10:31 AM

## 2018-01-24 NOTE — Progress Notes (Signed)
PROGRESS NOTE    Patrick Walter  MVE:[id number] DOB: 04/11/1955 DOA: 01/15/2018 PCP: [name], MD   Brief Narrative:  HPI on 01/16/2018 by Dr. Gean Birchwood Patrick Walter is a 62 y.o. male with history of bicuspid aortic valve status post bioprosthetic valve replacement in 2015 and at that time patient also has had PFO closure also receives injection for right hip osteoarthritis last one was last month has had recently traveled to [place] last week for sports event presents to the ER with complaints of having fever and chills with generalized body ache for the last 2 days.  Denies any chest pain headache visual symptoms sore throat nausea vomiting diarrhea or productive cough or shortness of breath.  Since patient has been having persistent symptoms last 2 days patient comes to the ER.  Interim history Patient found to have sepsis secondary to methicillin susceptible Staphylococcus epidermidis.  TEE revealed large vegetation on prosthetic aortic valve.  Cardiology as well as cardiothoracic surgery consulted and following. Plan for surgery on 01/26/2018. Assessment & Plan   Sepsis secondary to methicillin susceptible Staphylococcus epidermidis/prosthetic valve endocarditis -Patient had a history of dental work in August for which he took Amoxil.  Also had steroid injection in the hip approximately 1 month ago. -Influenza PCR as well as respiratory panel as well as strep throat were negative -HIV nonreactive -ID consulted and appreciated, patient was initially on Rocephin however transition to Ancef on 01/17/2018 -[facility] -TEE showed large vegetation on the prosthetic aortic valve -Blood cultures from 01/17/2018 show no growth to date -Cardiothoracic surgery consulted and appreciated, MRI of the brain as well as CT angiography ordered to rule out presence of subclinical embolization.  General surgery  consulted for possible pilonidal cyst. -MRI obtained: 2 tiny cortical infarct suspected in the left parietal lobe suspicious for sequela of septic emboli -CTA chest/abd/pelvis: Abrupt embolic occlusion of SMA, abrupt embolic occlusion of the distal splenic artery with multiple splenic infarcts, small left renal infarct, 4.2 cm ascending thoracic aortic aneurysm, no evidence of ischemic bowel -Discussed findings with vascular surgery, Dr. Donnetta Hutching, recommended speaking with thoracic surgery regarding possible for anticoagulation.  Feels these findings are all incidental as patient is currently asymptomatic. -Discussed with Dr. Ihor Gully, plan for surgery on 01/26/2018.  No indication for anticoagulation.  Septic emobli -Noted on MRI as well as CTA as above -Discussed with neurology, Dr. Leonel Ramsay, antithrombotics are not indicated -Speech therapy consulted -Currently patient does not need physical or occupational therapy, this may need to be readdressed after valve replacement/surgery  Left hip osteoarthritis -Celebrex on hold  Normocytic anemia -hemoglobin has been stable  -Hemoglobin currently 9.1 -continue to monitor CBC  Pain foramen ovale/asending aortic enlargement/moderate diastolic CHF -Noted on echocardiogram in 2018, per TEE, EF 60% with mild MR and AI -Stable  Pilonidal cyst -As above General surgery consulted and appreciated -s/p excision of chronic pilonidal sinus tract -cleanse daily and PRN after bowel movements, apply dry gauze as needed -surgery signed off  DVT Prophylaxis  Lovenox  Code Status: Full  Family Communication: None at bedside  Disposition Plan: Admitted. Pending surgery on 01/26/2018. Dispo TBD  Consultants Infectious disease Cardiology Cardiothoracic surgery General surgery Neurology, Dr. Leonel Ramsay, via phone Vascular surgery, Dr. Donnetta Hutching, via phone  Procedures  TEE Excision of chronic pilonidal sinus tract  Antibiotics   Anti-infectives (From  admission, onward)   Start     Dose/Rate Route Frequency Ordered Stop  01/22/18 2300  gentamicin (GARAMYCIN) IVPB 80 mg     80 mg 100 mL/hr over 30 Minutes Intravenous Every 12 hours 01/22/18 1115     01/20/18 1900  gentamicin (GARAMYCIN) IVPB 80 mg  Status:  Discontinued     80 mg 100 mL/hr over 30 Minutes Intravenous Every 8 hours 01/20/18 0945 01/22/18 1115   01/20/18 1400  rifampin (RIFADIN) capsule 300 mg  Status:  Discontinued     300 mg Oral Every 8 hours 01/20/18 0945 01/20/18 1000   01/20/18 1200  rifampin (RIFADIN) capsule 300 mg     300 mg Oral 3 times daily with meals 01/20/18 1000     01/20/18 1100  gentamicin (GARAMYCIN) IVPB 120 mg     120 mg 200 mL/hr over 30 Minutes Intravenous  Once 01/20/18 0945 01/20/18 1251   01/17/18 1400  ceFAZolin (ANCEF) IVPB 2g/100 mL premix     2 g 200 mL/hr over 30 Minutes Intravenous Every 8 hours 01/17/18 1156     01/17/18 1030  cefTRIAXone (ROCEPHIN) 2 g in sodium chloride 0.9 % 100 mL IVPB  Status:  Discontinued     2 g 200 mL/hr over 30 Minutes Intravenous Every 24 hours 01/17/18 0919 01/17/18 1156   01/16/18 0800  vancomycin (VANCOCIN) 1,750 mg in sodium chloride 0.9 % 500 mL IVPB  Status:  Discontinued     1,750 mg 250 mL/hr over 120 Minutes Intravenous Every 24 hours 01/16/18 0136 01/17/18 0919   01/16/18 0600  piperacillin-tazobactam (ZOSYN) IVPB 3.375 g  Status:  Discontinued     3.375 g 12.5 mL/hr over 240 Minutes Intravenous Every 8 hours 01/16/18 0136 01/17/18 0918   01/15/18 2130  piperacillin-tazobactam (ZOSYN) IVPB 3.375 g     3.375 g 100 mL/hr over 30 Minutes Intravenous  Once 01/15/18 2123 01/15/18 2211   01/15/18 2130  vancomycin (VANCOCIN) IVPB 1000 mg/200 mL premix     1,000 mg 200 mL/hr over 60 Minutes Intravenous  Once 01/15/18 2123 01/15/18 2313   01/15/18 2130  oseltamivir (TAMIFLU) capsule 75 mg     75 mg Oral  Once 01/15/18 2123 01/15/18 2134      Subjective:   Patrick Walter seen and examined today.   Patient has no complaints at this time.  Denies current chest pain, shortness of breath, abdominal pain, nausea or vomiting, diarrhea or constipation, dizziness or headache.  Objective:   Vitals:   01/23/18 2024 01/24/18 0110 01/24/18 0517 01/24/18 0808  BP: 97/79 94/65 102/72 106/74  Pulse: 96 82 78 87  Resp: (!) 22 11 (!) 28   Temp:  98.8 F (37.1 C) 98 F (36.7 C) 98.6 F (37 C)  TempSrc:  Oral Oral Tympanic  SpO2: 97% 94% 97%   Weight:      Height:        Intake/Output Summary (Last 24 hours) at 01/24/2018 1224 Last data filed at 01/24/2018 0300 Gross per 24 hour  Intake 100 ml  Output -  Net 100 ml   Filed Weights   01/15/18 1923  Weight: 80.7 kg   Exam  General: Well developed, well nourished, NAD, appears stated age  92: NCAT, mucous membranes moist.   Neck: Supple  Cardiovascular: S1 S2 auscultated, +SEM, RRR  Respiratory: Clear to auscultation bilaterally with equal chest rise  Abdomen: Soft, nontender, nondistended, + bowel sounds  Extremities: warm dry without cyanosis clubbing or edema  Neuro: AAOx3, nonfocal  Psych: Pleasant, appropriate mood and affect  Data Reviewed: I have personally  reviewed following labs and imaging studies  CBC: Recent Labs  Lab 01/18/18 0553 01/20/18 0330 01/22/18 0303 01/23/18 0729  WBC 14.8* 14.7* 17.8* 16.1*  HGB 8.8* 9.5* 8.8* 9.1*  HCT 29.2* 32.3* 29.9* 29.9*  MCV 87.2 88.0 86.2 85.2  PLT 251 356 363 283   Basic Metabolic Panel: Recent Labs  Lab 01/19/18 0603 01/20/18 0330 01/21/18 0432 01/22/18 0303 01/23/18 0418 01/23/18 0729 01/24/18 0337  NA 138 139  --  135  --  134*  --   K 4.3 4.4  --  4.1  --  4.0  --   CL 104 105  --  105  --  105  --   CO2 26 28  --  25  --  26  --   GLUCOSE 106* 117*  --  100*  --  102*  --   BUN 12 10  --  15  --  11  --   CREATININE 0.82 1.03 0.98 0.93 0.97 0.87 0.96  CALCIUM 10.4* 10.4*  --  9.5  --  10.1  --    GFR: Estimated Creatinine Clearance: 87.2  mL/min (by C-G formula based on SCr of 0.96 mg/dL). Liver Function Tests: Recent Labs  Lab 01/20/18 0330  AST 40  ALT 40  ALKPHOS 98  BILITOT 0.6  PROT 6.3*  ALBUMIN 2.8*   No results for input(s): LIPASE, AMYLASE in the last 168 hours. No results for input(s): AMMONIA in the last 168 hours. Coagulation Profile: No results for input(s): INR, PROTIME in the last 168 hours. Cardiac Enzymes: No results for input(s): CKTOTAL, CKMB, CKMBINDEX, TROPONINI in the last 168 hours. BNP (last 3 results) No results for input(s): PROBNP in the last 8760 hours. HbA1C: No results for input(s): HGBA1C in the last 72 hours. CBG: No results for input(s): GLUCAP in the last 168 hours. Lipid Profile: No results for input(s): CHOL, HDL, LDLCALC, TRIG, CHOLHDL, LDLDIRECT in the last 72 hours. Thyroid Function Tests: No results for input(s): TSH, T4TOTAL, FREET4, T3FREE, THYROIDAB in the last 72 hours. Anemia Panel: No results for input(s): VITAMINB12, FOLATE, FERRITIN, TIBC, IRON, RETICCTPCT in the last 72 hours. Urine analysis:    Component Value Date/Time   COLORURINE YELLOW 01/24/2018 0823   APPEARANCEUR CLEAR 01/24/2018 0823   LABSPEC 1.006 01/24/2018 0823   PHURINE 6.0 01/24/2018 0823   GLUCOSEU NEGATIVE 01/24/2018 0823   HGBUR NEGATIVE 01/24/2018 0823   BILIRUBINUR NEGATIVE 01/24/2018 0823   KETONESUR NEGATIVE 01/24/2018 0823   PROTEINUR NEGATIVE 01/24/2018 0823   UROBILINOGEN 0.2 12/03/2013 1344   NITRITE NEGATIVE 01/24/2018 0823   LEUKOCYTESUR NEGATIVE 01/24/2018 0823   Sepsis Labs: @LABRCNTIP (procalcitonin:4,lacticidven:4)  ) Recent Results (from the past 240 hour(s))  Culture, blood (Routine X 2) w Reflex to ID Panel     Status: Abnormal   Collection Time: 01/15/18  8:15 PM  Result Value Ref Range Status   Specimen Description   Final    BLOOD RIGHT HAND Performed at [facility], [address] [place], [place] [address]    Special Requests   Final     BOTTLES DRAWN AEROBIC AND ANAEROBIC Blood Culture adequate volume Performed at [facility], [address] [place], [place] [address]    Culture  Setup Time   Final    IN BOTH AEROBIC AND ANAEROBIC BOTTLES GRAM POSITIVE COCCI CRITICAL VALUE NOTED.  VALUE IS CONSISTENT WITH PREVIOUSLY REPORTED AND CALLED VALUE.    Culture (A)  Final  STAPHYLOCOCCUS EPIDERMIDIS SUSCEPTIBILITIES PERFORMED ON PREVIOUS CULTURE WITHIN THE LAST 5 DAYS. Performed at [facility], [place] [address], [place], [place] [address]    Report Status 01/19/2018 FINAL  Final  Culture, blood (Routine X 2) w Reflex to ID Panel     Status: Abnormal   Collection Time: 01/15/18  8:15 PM  Result Value Ref Range Status   Specimen Description   Final    BLOOD RIGHT ARM Performed at [facility], [address] [place], [place] [address]    Special Requests   Final    BOTTLES DRAWN AEROBIC AND ANAEROBIC Blood Culture adequate volume Performed at [facility], [address] [place], [place] [address]    Culture  Setup Time   Final    GRAM POSITIVE COCCI IN BOTH AEROBIC AND ANAEROBIC BOTTLES CRITICAL RESULT CALLED TO, READ BACK BY AND VERIFIED WITH: Sheffield Slider [facility] 01/16/18 1937 JDW    Culture STAPHYLOCOCCUS EPIDERMIDIS (A)  Final   Report Status 01/18/2018 FINAL  Final   Organism ID, Bacteria STAPHYLOCOCCUS EPIDERMIDIS  Final      Susceptibility   Staphylococcus epidermidis - MIC*    CIPROFLOXACIN <=0.5 SENSITIVE Sensitive     ERYTHROMYCIN <=0.25 SENSITIVE Sensitive     GENTAMICIN <=0.5 SENSITIVE Sensitive     OXACILLIN <=0.25 SENSITIVE Sensitive     TETRACYCLINE <=1 SENSITIVE Sensitive     VANCOMYCIN 1 SENSITIVE Sensitive     TRIMETH/SULFA <=10 SENSITIVE Sensitive     CLINDAMYCIN <=0.25 SENSITIVE Sensitive     RIFAMPIN <=0.5 SENSITIVE Sensitive     Inducible Clindamycin NEGATIVE Sensitive     * STAPHYLOCOCCUS EPIDERMIDIS  Blood Culture ID Panel (Reflexed)     Status:  Abnormal   Collection Time: 01/15/18  8:15 PM  Result Value Ref Range Status   Enterococcus species NOT DETECTED NOT DETECTED Final   Listeria monocytogenes NOT DETECTED NOT DETECTED Final   Staphylococcus species DETECTED (A) NOT DETECTED Final    Comment: Methicillin (oxacillin) susceptible coagulase negative staphylococcus. Possible blood culture contaminant (unless isolated from more than one blood culture draw or clinical case suggests pathogenicity). No antibiotic treatment is indicated for blood  culture contaminants. CRITICAL RESULT CALLED TO, READ BACK BY AND VERIFIED WITH: Henry Russel [facility] 01/16/18 1937 JDW    Staphylococcus aureus (BCID) NOT DETECTED NOT DETECTED Final   Methicillin resistance NOT DETECTED NOT DETECTED Final   Streptococcus species NOT DETECTED NOT DETECTED Final   Streptococcus agalactiae NOT DETECTED NOT DETECTED Final   Streptococcus pneumoniae NOT DETECTED NOT DETECTED Final   Streptococcus pyogenes NOT DETECTED NOT DETECTED Final   Acinetobacter baumannii NOT DETECTED NOT DETECTED Final   Enterobacteriaceae species NOT DETECTED NOT DETECTED Final   Enterobacter cloacae complex NOT DETECTED NOT DETECTED Final   Escherichia coli NOT DETECTED NOT DETECTED Final   Klebsiella oxytoca NOT DETECTED NOT DETECTED Final   Klebsiella pneumoniae NOT DETECTED NOT DETECTED Final   Proteus species NOT DETECTED NOT DETECTED Final   Serratia marcescens NOT DETECTED NOT DETECTED Final   Haemophilus influenzae NOT DETECTED NOT DETECTED Final   Neisseria meningitidis NOT DETECTED NOT DETECTED Final   Pseudomonas aeruginosa NOT DETECTED NOT DETECTED Final   Candida albicans NOT DETECTED NOT DETECTED Final   Candida glabrata NOT DETECTED NOT DETECTED Final   Candida krusei NOT DETECTED NOT DETECTED Final   Candida parapsilosis NOT DETECTED NOT DETECTED Final   Candida tropicalis NOT DETECTED NOT DETECTED Final  Comment: Performed at [facility], [place] [address] [place], [place] [address]  Group A Strep by PCR     Status: None   Collection Time: 01/15/18  8:19 PM  Result Value Ref Range Status   Group A Strep by PCR NOT DETECTED NOT DETECTED Final    Comment: Performed at [facility], [address] [place], [place] [address]  Respiratory Panel by PCR     Status: None   Collection Time: 01/16/18  7:52 AM  Result Value Ref Range Status   Adenovirus NOT DETECTED NOT DETECTED Final   Coronavirus 229E NOT DETECTED NOT DETECTED Final   Coronavirus HKU1 NOT DETECTED NOT DETECTED Final   Coronavirus NL63 NOT DETECTED NOT DETECTED Final   Coronavirus OC43 NOT DETECTED NOT DETECTED Final   Metapneumovirus NOT DETECTED NOT DETECTED Final   Rhinovirus / Enterovirus NOT DETECTED NOT DETECTED Final   Influenza A NOT DETECTED NOT DETECTED Final   Influenza B NOT DETECTED NOT DETECTED Final   Parainfluenza Virus 1 NOT DETECTED NOT DETECTED Final   Parainfluenza Virus 2 NOT DETECTED NOT DETECTED Final   Parainfluenza Virus 3 NOT DETECTED NOT DETECTED Final   Parainfluenza Virus 4 NOT DETECTED NOT DETECTED Final   Respiratory Syncytial Virus NOT DETECTED NOT DETECTED Final   Bordetella pertussis NOT DETECTED NOT DETECTED Final   Chlamydophila pneumoniae NOT DETECTED NOT DETECTED Final   Mycoplasma pneumoniae NOT DETECTED NOT DETECTED Final  MRSA PCR Screening     Status: None   Collection Time: 01/16/18 11:08 AM  Result Value Ref Range Status   MRSA by PCR NEGATIVE NEGATIVE Final    Comment:        The GeneXpert MRSA Assay (FDA approved for NASAL specimens only), is one component of a comprehensive MRSA colonization surveillance program. It is not intended to diagnose MRSA infection nor to guide or monitor treatment for MRSA infections. Performed at [facility], [place] [address], [place], [place] [address]   Culture, blood (Routine X 2) w Reflex to ID Panel     Status: None   Collection Time: 01/17/18 12:57 PM  Result  Value Ref Range Status   Specimen Description   Final    BLOOD LEFT ARM Performed at [place] [address] [place], [place] [address]    Special Requests   Final    BOTTLES DRAWN AEROBIC AND ANAEROBIC Blood Culture adequate volume   Culture   Final    NO GROWTH 5 DAYS Performed at [facility], [place] [address], [place], [place] [address]    Report Status 01/22/2018 FINAL  Final      Radiology Studies: No results found.   Scheduled Meds: . benzonatate  200 mg Oral TID  . enoxaparin (LOVENOX) injection  40 mg Subcutaneous QHS  . rifampin  300 mg Oral TID WC   Continuous Infusions: . sodium chloride 250 mL (01/19/18 0612)  .  ceFAZolin (ANCEF) IV 2 g (01/24/18 1023)  . gentamicin 80 mg (01/24/18 1148)     LOS: 9 days   Time Spent in minutes   30 minutes  Yuki Purves D.O. on 01/24/2018 at 12:24 PM  Between 7am to 7pm - Please see pager noted on [email or web address]  After 7pm go to [email or web address]  And look for the night coverage person covering for me after hours  [facility] Office  [phone]

## 2018-01-25 DIAGNOSIS — A419 Sepsis, unspecified organism: Secondary | ICD-10-CM

## 2018-01-25 DIAGNOSIS — I10 Essential (primary) hypertension: Secondary | ICD-10-CM

## 2018-01-25 LAB — PROTIME-INR
INR: 1.17
PROTHROMBIN TIME: 14.8 s (ref 11.4–15.2)

## 2018-01-25 LAB — HEMOGLOBIN A1C
Hgb A1c MFr Bld: 5.5 % (ref 4.8–5.6)
MEAN PLASMA GLUCOSE: 111.15 mg/dL

## 2018-01-25 LAB — BLOOD GAS, ARTERIAL
Acid-Base Excess: 2.2 mmol/L — ABNORMAL HIGH (ref 0.0–2.0)
Bicarbonate: 25.7 mmol/L (ref 20.0–28.0)
DRAWN BY: 313941
FIO2: 21
O2 Saturation: 96.6 %
PO2 ART: 81.5 mmHg — AB (ref 83.0–108.0)
Patient temperature: 97.9
pCO2 arterial: 35.2 mmHg (ref 32.0–48.0)
pH, Arterial: 7.475 — ABNORMAL HIGH (ref 7.350–7.450)

## 2018-01-25 LAB — CBC
HCT: 31.1 % — ABNORMAL LOW (ref 39.0–52.0)
Hemoglobin: 9.6 g/dL — ABNORMAL LOW (ref 13.0–17.0)
MCH: 26.1 pg (ref 26.0–34.0)
MCHC: 30.9 g/dL (ref 30.0–36.0)
MCV: 84.5 fL (ref 80.0–100.0)
NRBC: 0 % (ref 0.0–0.2)
Platelets: 346 10*3/uL (ref 150–400)
RBC: 3.68 MIL/uL — ABNORMAL LOW (ref 4.22–5.81)
RDW: 13.3 % (ref 11.5–15.5)
WBC: 14.7 10*3/uL — AB (ref 4.0–10.5)

## 2018-01-25 LAB — COMPREHENSIVE METABOLIC PANEL
ALT: 15 U/L (ref 0–44)
AST: 18 U/L (ref 15–41)
Albumin: 2.8 g/dL — ABNORMAL LOW (ref 3.5–5.0)
Alkaline Phosphatase: 94 U/L (ref 38–126)
Anion gap: 7 (ref 5–15)
BILIRUBIN TOTAL: 0.7 mg/dL (ref 0.3–1.2)
BUN: 16 mg/dL (ref 6–20)
CO2: 26 mmol/L (ref 22–32)
CREATININE: 0.99 mg/dL (ref 0.61–1.24)
Calcium: 10.3 mg/dL (ref 8.9–10.3)
Chloride: 101 mmol/L (ref 98–111)
GFR calc Af Amer: >60 mL/min (ref >60)
GFR calc non Af Amer: >60 mL/min (ref >60)
Glucose, Bld: 116 mg/dL — ABNORMAL HIGH (ref 70–99)
POTASSIUM: 4.3 mmol/L (ref 3.5–5.1)
Sodium: 134 mmol/L — ABNORMAL LOW (ref 135–145)
Total Protein: 6.2 g/dL — ABNORMAL LOW (ref 6.5–8.1)

## 2018-01-25 LAB — APTT: APTT: 38 s — AB (ref 24–36)

## 2018-01-25 LAB — PREALBUMIN: Prealbumin: 11.6 mg/dL — ABNORMAL LOW (ref 18–38)

## 2018-01-25 LAB — MRSA PCR SCREENING: MRSA by PCR: NEGATIVE

## 2018-01-25 LAB — GENTAMICIN LEVEL, TROUGH: Gentamicin Trough: 0.9 ug/mL (ref 0.5–2.0)

## 2018-01-25 MED ORDER — DOPAMINE-DEXTROSE 3.2-5 MG/ML-% IV SOLN
0.0000 ug/kg/min | INTRAVENOUS | Status: DC
  Filled 2018-01-25: qty 250

## 2018-01-25 MED ORDER — SODIUM CHLORIDE 0.9 % IV SOLN
INTRAVENOUS | Status: DC
  Filled 2018-01-25: qty 30

## 2018-01-25 MED ORDER — BISACODYL 5 MG PO TBEC
5.0000 mg | DELAYED_RELEASE_TABLET | Freq: Once | ORAL | Status: DC
  Filled 2018-01-25: qty 1

## 2018-01-25 MED ORDER — PHENYLEPHRINE HCL-NACL 20-0.9 MG/250ML-% IV SOLN
30.0000 ug/min | INTRAVENOUS | Status: AC
  Administered 2018-01-26: 50 ug/min via INTRAVENOUS
  Filled 2018-01-25: qty 250

## 2018-01-25 MED ORDER — SODIUM CHLORIDE 0.9 % IV SOLN
750.0000 mg | INTRAVENOUS | Status: DC
  Filled 2018-01-25: qty 750

## 2018-01-25 MED ORDER — SODIUM CHLORIDE 0.9 % IV SOLN
1.5000 g | INTRAVENOUS | Status: AC
  Administered 2018-01-26: 1.5 g via INTRAVENOUS
  Filled 2018-01-25: qty 1.5

## 2018-01-25 MED ORDER — CHLORHEXIDINE GLUCONATE 4 % EX LIQD
60.0000 mL | Freq: Once | CUTANEOUS | Status: AC
  Administered 2018-01-25: 4 via TOPICAL
  Filled 2018-01-25: qty 60

## 2018-01-25 MED ORDER — TRANEXAMIC ACID 1000 MG/10ML IV SOLN
1.5000 mg/kg/h | INTRAVENOUS | Status: AC
  Administered 2018-01-26: 1.5 mg/kg/h via INTRAVENOUS
  Filled 2018-01-25: qty 25

## 2018-01-25 MED ORDER — KENNESTONE BLOOD CARDIOPLEGIA (KBC) MANNITOL SYRINGE (20%, 32ML)
32.0000 mL | Freq: Once | INTRAVENOUS | Status: DC
  Filled 2018-01-25: qty 32

## 2018-01-25 MED ORDER — POTASSIUM CHLORIDE 2 MEQ/ML IV SOLN
80.0000 meq | INTRAVENOUS | Status: DC
  Filled 2018-01-25: qty 40

## 2018-01-25 MED ORDER — CHLORHEXIDINE GLUCONATE 0.12 % MT SOLN
15.0000 mL | Freq: Once | OROMUCOSAL | Status: AC
  Administered 2018-01-26: 15 mL via OROMUCOSAL
  Filled 2018-01-25: qty 15

## 2018-01-25 MED ORDER — VANCOMYCIN HCL 10 G IV SOLR
1250.0000 mg | INTRAVENOUS | Status: AC
  Administered 2018-01-26: 1250 mg via INTRAVENOUS
  Filled 2018-01-25: qty 1250

## 2018-01-25 MED ORDER — TEMAZEPAM 7.5 MG PO CAPS: 15.0000 mg | ORAL_CAPSULE | Freq: Once | ORAL | Status: DC | PRN

## 2018-01-25 MED ORDER — METOPROLOL TARTRATE 12.5 MG HALF TABLET
12.5000 mg | ORAL_TABLET | Freq: Once | ORAL | Status: AC
  Administered 2018-01-26: 12.5 mg via ORAL
  Filled 2018-01-25: qty 1

## 2018-01-25 MED ORDER — KENNESTONE BLOOD CARDIOPLEGIA VIAL
13.0000 mL | Freq: Once | Status: DC
  Filled 2018-01-25: qty 13

## 2018-01-25 MED ORDER — TRANEXAMIC ACID (OHS) BOLUS VIA INFUSION
15.0000 mg/kg | INTRAVENOUS | Status: AC
  Administered 2018-01-26: 1189.5 mg via INTRAVENOUS
  Filled 2018-01-25: qty 1190

## 2018-01-25 MED ORDER — CHLORHEXIDINE GLUCONATE 4 % EX LIQD
60.0000 mL | Freq: Once | CUTANEOUS | Status: AC
  Administered 2018-01-26: 4 via TOPICAL
  Filled 2018-01-25: qty 60

## 2018-01-25 MED ORDER — VANCOMYCIN HCL 1000 MG IV SOLR
INTRAVENOUS | Status: AC
  Administered 2018-01-26: 1000 mL
  Filled 2018-01-25: qty 1000

## 2018-01-25 MED ORDER — NOREPINEPHRINE 4 MG/250ML-% IV SOLN
0.0000 ug/min | INTRAVENOUS | Status: DC
  Filled 2018-01-25: qty 250

## 2018-01-25 MED ORDER — EPINEPHRINE PF 1 MG/ML IJ SOLN
0.0000 ug/min | INTRAVENOUS | Status: DC
  Filled 2018-01-25: qty 4

## 2018-01-25 MED ORDER — MILRINONE LACTATE IN DEXTROSE 20-5 MG/100ML-% IV SOLN
0.3000 ug/kg/min | INTRAVENOUS | Status: DC
  Filled 2018-01-25: qty 100

## 2018-01-25 MED ORDER — MAGNESIUM SULFATE 50 % IJ SOLN
40.0000 meq | INTRAMUSCULAR | Status: DC
  Filled 2018-01-25: qty 9.85

## 2018-01-25 MED ORDER — TRANEXAMIC ACID (OHS) PUMP PRIME SOLUTION
2.0000 mg/kg | INTRAVENOUS | Status: DC
  Filled 2018-01-25: qty 1.59

## 2018-01-25 MED ORDER — PLASMA-LYTE 148 IV SOLN
INTRAVENOUS | Status: DC
  Filled 2018-01-25: qty 2.5

## 2018-01-25 MED ORDER — NITROGLYCERIN IN D5W 200-5 MCG/ML-% IV SOLN
2.0000 ug/min | INTRAVENOUS | Status: DC
  Filled 2018-01-25: qty 250

## 2018-01-25 MED ORDER — DEXMEDETOMIDINE HCL IN NACL 400 MCG/100ML IV SOLN
0.1000 ug/kg/h | INTRAVENOUS | Status: AC
  Administered 2018-01-26: 0.7 ug/kg/h via INTRAVENOUS
  Filled 2018-01-25: qty 100

## 2018-01-25 MED ORDER — INSULIN REGULAR(HUMAN) IN NACL 100-0.9 UT/100ML-% IV SOLN
INTRAVENOUS | Status: AC
  Administered 2018-01-26: 1 [IU]/h via INTRAVENOUS
  Filled 2018-01-25: qty 100

## 2018-01-25 NOTE — Progress Notes (Signed)
      [address]       [place] [address]             [phone]     CARDIOTHORACIC SURGERY PROGRESS NOTE  6 Days Post-Op  S/P Procedure(s) (LRB): TRANSESOPHAGEAL ECHOCARDIOGRAM (TEE) (N/A)  Subjective: No complaints.  Mild soreness pre-sacral region.  Objective: Vital signs in last 24 hours: Temp:  [97.9 F (36.6 C)-99.7 F (37.6 C)] 98.1 F (36.7 C) (11/06 1240) Pulse Rate:  [79-97] 89 (11/06 1240) Cardiac Rhythm: Heart block;Bundle branch block (11/06 1447) Resp:  [14-28] 21 (11/06 1240) BP: (94-106)/(67-81) 94/67 (11/06 1240) SpO2:  [95 %-97 %] 95 % (11/06 1240) Weight:  [79.3 kg] 79.3 kg (11/06 0451)  Physical Exam:  Rhythm:   sinus  Breath sounds: clear  Heart sounds:  RRR   Incisions:  n/a  Abdomen:  soft  Extremities:  warm   Intake/Output from previous day: 11/05 0701 - 11/06 0700 In: 240 [P.O.:240] Out: -  Intake/Output this shift: No intake/output data recorded.  Lab Results: Recent Labs    01/23/18 0729 01/25/18 0321  WBC 16.1* 14.7*  HGB 9.1* 9.6*  HCT 29.9* 31.1*  PLT 368 346   BMET:  Recent Labs    01/23/18 0729 01/24/18 0337 01/25/18 0321  NA 134*  --  134*  K 4.0  --  4.3  CL 105  --  101  CO2 26  --  26  GLUCOSE 102*  --  116*  BUN 11  --  16  CREATININE 0.87 0.96 0.99  CALCIUM 10.1  --  10.3    CBG (last 3)  No results for input(s): GLUCAP in the last 72 hours. PT/INR:   Recent Labs    01/25/18 0321  LABPROT 14.8  INR 1.17    CXR:  N/A  Assessment/Plan: S/P Procedure(s) (LRB): TRANSESOPHAGEAL ECHOCARDIOGRAM (TEE) (N/A)  The patient was again counseled at length regarding treatment alternatives for management of prosthetic valve endocarditis including continued medical therapy versus proceeding with redo aortic valve replacement.  Discussion was held comparing the relative risks of mechanical valve replacement with need for lifelong anticoagulation versus use of a bioprosthetic tissue valve and  the associated potential for late structural valve deterioration and failure.  This discussion was placed in the context of the patient's particular circumstances, and as a result the patient specifically requests that their valve be replaced using a bioprosthetic tissue valve.   The patient understands and accepts all potential associated risks of surgery including but not limited to risk of death, stroke, myocardial infarction, congestive heart failure, respiratory failure, renal failure, pneumonia, bleeding requiring blood transfusion and or reexploration, arrhythmia, heart block or bradycardia requiring permanent pacemaker, aortic dissection or other major vascular complication, pleural effusions or other delayed complications related to continued congestive heart failure, and other late complications related to valve replacement including structural valve deterioration and failure, thrombosis, endocarditis, or paravalvular leak.  For OR tomorrow.  All questions answered.   I spent in excess of 15 minutes during the conduct of this hospital encounter and >50% of this time involved direct face-to-face encounter with the patient for counseling and/or coordination of their care.    [name], MD 01/25/2018 6:23 PM

## 2018-01-25 NOTE — Anesthesia Preprocedure Evaluation (Addendum)
Anesthesia Evaluation  Patient identified by MRN, date of birth, ID band Patient awake    Reviewed: Allergy & Precautions, NPO status , Patient's Chart, lab work & pertinent test results  History of Anesthesia Complications Negative for: history of anesthetic complications  Airway Mallampati: II  TM Distance: >3 FB Neck ROM: Full    Dental  (+) Dental Advisory Given   Pulmonary neg pulmonary ROS,    breath sounds clear to auscultation       Cardiovascular hypertension, (-) angina+ CAD ('15 cath: mild, non-obstructive disease) and + Peripheral Vascular Disease (4.2 cm ascending aortic enlargement, possible SMA embolus)  + Valvular Problems/Murmurs (s/p aortic valve replacement and PFO closure (h/o bicuspid valve))  Rhythm:Regular Rate:Normal + Systolic murmurs 39/03 ECHO: EF 60-65%, 2.8 x1.2 cm mobile vegetation on the L ventricular aspect of the anterior bioprosthetic cusp   Neuro/Psych    GI/Hepatic negative GI ROS, Neg liver ROS,   Endo/Other  negative endocrine ROS  Renal/GU negative Renal ROS     Musculoskeletal  (+) Arthritis ,   Abdominal   Peds  Hematology  (+) Blood dyscrasia (Hb 9.6), ,   Anesthesia Other Findings Endocarditis of prosthetic aortic valve (source infected pilonidal cyst?)  Reproductive/Obstetrics                           Anesthesia Physical Anesthesia Plan  ASA: IV  Anesthesia Plan: General   Post-op Pain Management:    Induction: Intravenous  PONV Risk Score and Plan: 2 and Treatment may vary due to age or medical condition  Airway Management Planned: Oral ETT  Additional Equipment: Arterial line, PA Cath, 3D TEE and Ultrasound Guidance Line Placement  Intra-op Plan:   Post-operative Plan: Post-operative intubation/ventilation  Informed Consent: I have reviewed the patients History and Physical, chart, labs and discussed the procedure including the  risks, benefits and alternatives for the proposed anesthesia with the patient or authorized representative who has indicated his/her understanding and acceptance.   Dental advisory given  Plan Discussed with: CRNA and Surgeon  Anesthesia Plan Comments:        Anesthesia Quick Evaluation

## 2018-01-25 NOTE — Care Management Note (Signed)
Case Management Note Marvetta Gibbons RN, BSN Transitions of Care Unit 4E- RN Case Manager [phone]  Patient Details  Name: Patrick Walter MRN: [id number] Date of Birth: [birth year]  Subjective/Objective:   Pt tx [facility] to [facility] for further treatment of sepsis secondary to methicillin susceptible Staphylococcus epidermidis/prosthetic valve endocarditis, TEE showed large vegetation on the prosthetic aortic valve CVTS consulted and plan for redo valve replacement on 11/7               Action/Plan: PTA pt lived at home with spouse, independent- anticipate return home, CM to follow for transition of care needs post op.   Expected Discharge Date:                  Expected Discharge Plan:     In-House Referral:     Discharge planning Services  CM Consult  Post Acute Care Choice:    Choice offered to:     DME Arranged:    DME Agency:     HH Arranged:    HH Agency:     Status of Service:  In process, will continue to follow  If discussed at Long Length of Stay Meetings, dates discussed:    Discharge Disposition:   Additional Comments:  Dawayne Patricia, RN 01/25/2018, 2:32 PM

## 2018-01-25 NOTE — Progress Notes (Signed)
PROGRESS NOTE    Patrick Walter  YIR:[id number] DOB: January 09, 1956 DOA: 01/15/2018 PCP: [name], MD  Brief Narrative:  Patrick Walter a 62 y.o.malewithhistory of bicuspid aortic valve status post bioprosthetic valve replacement in 2015 and at that time patient also has had PFO closure also receives injection for right hip osteoarthritis last one was last month has had recently traveled to [place] last week for sports event presents to the ER with complaints of having fever and chills with generalized body ache for the last 2 days.  Patient found to have sepsis secondary to methicillin susceptible Staphylococcus epidermidis.  TEE revealed large vegetation on prosthetic aortic valve.  Cardiology as well as cardiothoracic surgery consulted and following. Plan for surgery on 01/26/2018.    Assessment & Plan:   Principal Problem:   Prosthetic valve endocarditis (HCC) Active Problems:   Essential hypertension, benign   S/P aortic valve replacement with bioprosthetic valve   Primary osteoarthritis of left hip   Hypercalcemia   Sepsis (HCC)   Normocytic anemia   Staphylococcus epidermidis bacteremia   Septic embolism (HCC)   Sepsis sec to MSSE/ PV endocarditis.  - h/o dental work in August and intra articular steroid injection last month.  - ID consulted and recommendations given.  - pt transferred to [facility] and underwent TEE, showing large vegetation on the prosthetic aortic valve. Blood cultures from 10/29 no growth till date.  - cardiothoracic surgery consulted and plan for OR tomorrow.  Septic emboli on MRI brain : 2 tiny cortical infarct suspected in the left parietal lobe suspicious for septic emboli. Discussed with Dr Leonel Ramsay , anti thrombotic are not indicated.   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  No  need for anti coagulation as per Dr Donnetta Hutching and the patient remains asymptomatic.    Anemia of chronic disease Transfuse to keep hemoglobin greater than 7.    Pain foramen ovale/ Ascending aortic enlargement/ moderate diastolic CHF - noted on echo in 2018, on TEE. LVEF is 60%  Pilonidal cyst:  Surgery consulted and he underwent excision of chronic pilonidal sinus tract.  Cleanse daily.    DVT prophylaxis: lovenox.  Code Status: full code.  Family Communication: family at bedside.  Disposition Plan: pending surgery on 11/7.    Consultants:   Cardiothoracic surgery.   Infectious disease.   Cardiology  General surgery.   Neurology   Vascular surgery.    Procedures: TEE  Excision of chronic pilonidal sinus tract.    Antimicrobials:  Ancef and gentamicin as per ID.   Subjective: Pt denies any chest pain or sob. No nausea or vomiting.  No abd pain.   Objective: Vitals:   01/25/18 0028 01/25/18 0451 01/25/18 0802 01/25/18 1240  BP: 99/73 106/81 104/77 94/67  Pulse: 92 79 86 89  Resp: 18 14 14  (!) 21  Temp: 98.7 F (37.1 C) 97.9 F (36.6 C) 98 F (36.7 C) 98.1 F (36.7 C)  TempSrc: Oral Oral Oral Oral  SpO2: 97% 97% 95% 95%  Weight:  79.3 kg    Height:       No intake or output data in the 24 hours ending 01/25/18 1606 Filed Weights   01/15/18 1923 01/25/18 0451  Weight: 80.7 kg 79.3 kg    Examination:  General exam: Appears calm and comfortable  Respiratory system: Clear to auscultation. Respiratory effort normal.  Cardiovascular system: S1 & S2 heard, RRR. No JVD,  No pedal edema. Gastrointestinal system: Abdomen is nondistended, soft and nontender. No organomegaly or masses felt. Normal bowel sounds heard. Central nervous system: Alert and oriented. No focal neurological deficits. Extremities: Symmetric 5 x 5 power. Skin: No rashes, lesions or ulcers Psychiatry:Mood & affect appropriate.     Data Reviewed: I have personally reviewed following  labs and imaging studies  CBC: Recent Labs  Lab 01/20/18 0330 01/22/18 0303 01/23/18 0729 01/25/18 0321  WBC 14.7* 17.8* 16.1* 14.7*  HGB 9.5* 8.8* 9.1* 9.6*  HCT 32.3* 29.9* 29.9* 31.1*  MCV 88.0 86.2 85.2 84.5  PLT 356 363 368 829   Basic Metabolic Panel: Recent Labs  Lab 01/19/18 0603 01/20/18 0330  01/22/18 0303 01/23/18 0418 01/23/18 0729 01/24/18 0337 01/25/18 0321  NA 138 139  --  135  --  134*  --  134*  K 4.3 4.4  --  4.1  --  4.0  --  4.3  CL 104 105  --  105  --  105  --  101  CO2 26 28  --  25  --  26  --  26  GLUCOSE 106* 117*  --  100*  --  102*  --  116*  BUN 12 10  --  15  --  11  --  16  CREATININE 0.82 1.03   < > 0.93 0.97 0.87 0.96 0.99  CALCIUM 10.4* 10.4*  --  9.5  --  10.1  --  10.3   < > = values in this interval not displayed.   GFR: Estimated Creatinine Clearance: 84.5 mL/min (by C-G formula based on SCr of 0.99 mg/dL). Liver Function Tests: Recent Labs  Lab 01/20/18 0330 01/25/18 0321  AST 40 18  ALT 40 15  ALKPHOS 98 94  BILITOT 0.6 0.7  PROT 6.3* 6.2*  ALBUMIN 2.8* 2.8*   No results for input(s): LIPASE, AMYLASE in the last 168 hours. No results for input(s): AMMONIA in the last 168 hours. Coagulation Profile: Recent Labs  Lab 01/25/18 0321  INR 1.17   Cardiac Enzymes: No results for input(s): CKTOTAL, CKMB, CKMBINDEX, TROPONINI in the last 168 hours. BNP (last 3 results) No results for input(s): PROBNP in the last 8760 hours. HbA1C: Recent Labs    01/25/18 1244  HGBA1C 5.5   CBG: No results for input(s): GLUCAP in the last 168 hours. Lipid Profile: No results for input(s): CHOL, HDL, LDLCALC, TRIG, CHOLHDL, LDLDIRECT in the last 72 hours. Thyroid Function Tests: No results for input(s): TSH, T4TOTAL, FREET4, T3FREE, THYROIDAB in the last 72 hours. Anemia Panel: No results for input(s): VITAMINB12, FOLATE, FERRITIN, TIBC, IRON, RETICCTPCT in the last 72 hours. Sepsis Labs: No results for input(s): PROCALCITON,  LATICACIDVEN in the last 168 hours.  Recent Results (from the past 240 hour(s))  Culture, blood (Routine X 2) w Reflex to ID Panel     Status: Abnormal   Collection Time: 01/15/18  8:15 PM  Result Value Ref Range Status   Specimen Description   Final    BLOOD RIGHT HAND Performed at [facility], [address] [place], [place] [address]    Special Requests   Final    BOTTLES DRAWN AEROBIC AND ANAEROBIC Blood Culture adequate volume Performed at [facility], [address] [place], [place] [address]    Culture  Setup Time   Final    IN BOTH AEROBIC AND ANAEROBIC  BOTTLES GRAM POSITIVE COCCI CRITICAL VALUE NOTED.  VALUE IS CONSISTENT WITH PREVIOUSLY REPORTED AND CALLED VALUE.    Culture (A)  Final    STAPHYLOCOCCUS EPIDERMIDIS SUSCEPTIBILITIES PERFORMED ON PREVIOUS CULTURE WITHIN THE LAST 5 DAYS. Performed at [facility], [place] [address] [place], [place] [address]    Report Status 01/19/2018 FINAL  Final  Culture, blood (Routine X 2) w Reflex to ID Panel     Status: Abnormal   Collection Time: 01/15/18  8:15 PM  Result Value Ref Range Status   Specimen Description   Final    BLOOD RIGHT ARM Performed at [facility], [address] [place], [place] [address]    Special Requests   Final    BOTTLES DRAWN AEROBIC AND ANAEROBIC Blood Culture adequate volume Performed at [facility], [address] [place], [place] [address]    Culture  Setup Time   Final    GRAM POSITIVE COCCI IN BOTH AEROBIC AND ANAEROBIC BOTTLES CRITICAL RESULT CALLED TO, READ BACK BY AND VERIFIED WITH: Sheffield Slider [facility] 01/16/18 1937 JDW    Culture STAPHYLOCOCCUS EPIDERMIDIS (A)  Final   Report Status 01/18/2018 FINAL  Final   Organism ID, Bacteria STAPHYLOCOCCUS EPIDERMIDIS  Final      Susceptibility   Staphylococcus epidermidis - MIC*    CIPROFLOXACIN <=0.5 SENSITIVE Sensitive     ERYTHROMYCIN <=0.25 SENSITIVE Sensitive     GENTAMICIN <=0.5  SENSITIVE Sensitive     OXACILLIN <=0.25 SENSITIVE Sensitive     TETRACYCLINE <=1 SENSITIVE Sensitive     VANCOMYCIN 1 SENSITIVE Sensitive     TRIMETH/SULFA <=10 SENSITIVE Sensitive     CLINDAMYCIN <=0.25 SENSITIVE Sensitive     RIFAMPIN <=0.5 SENSITIVE Sensitive     Inducible Clindamycin NEGATIVE Sensitive     * STAPHYLOCOCCUS EPIDERMIDIS  Blood Culture ID Panel (Reflexed)     Status: Abnormal   Collection Time: 01/15/18  8:15 PM  Result Value Ref Range Status   Enterococcus species NOT DETECTED NOT DETECTED Final   Listeria monocytogenes NOT DETECTED NOT DETECTED Final   Staphylococcus species DETECTED (A) NOT DETECTED Final    Comment: Methicillin (oxacillin) susceptible coagulase negative staphylococcus. Possible blood culture contaminant (unless isolated from more than one blood culture draw or clinical case suggests pathogenicity). No antibiotic treatment is indicated for blood  culture contaminants. CRITICAL RESULT CALLED TO, READ BACK BY AND VERIFIED WITH: Henry Russel [facility] 01/16/18 1937 JDW    Staphylococcus aureus (BCID) NOT DETECTED NOT DETECTED Final   Methicillin resistance NOT DETECTED NOT DETECTED Final   Streptococcus species NOT DETECTED NOT DETECTED Final   Streptococcus agalactiae NOT DETECTED NOT DETECTED Final   Streptococcus pneumoniae NOT DETECTED NOT DETECTED Final   Streptococcus pyogenes NOT DETECTED NOT DETECTED Final   Acinetobacter baumannii NOT DETECTED NOT DETECTED Final   Enterobacteriaceae species NOT DETECTED NOT DETECTED Final   Enterobacter cloacae complex NOT DETECTED NOT DETECTED Final   Escherichia coli NOT DETECTED NOT DETECTED Final   Klebsiella oxytoca NOT DETECTED NOT DETECTED Final   Klebsiella pneumoniae NOT DETECTED NOT DETECTED Final   Proteus species NOT DETECTED NOT DETECTED Final   Serratia marcescens NOT DETECTED NOT DETECTED Final   Haemophilus influenzae NOT DETECTED NOT DETECTED Final   Neisseria meningitidis NOT DETECTED NOT  DETECTED Final   Pseudomonas aeruginosa NOT DETECTED NOT DETECTED Final   Candida albicans NOT DETECTED NOT DETECTED Final   Candida glabrata NOT DETECTED NOT DETECTED Final  Candida krusei NOT DETECTED NOT DETECTED Final   Candida parapsilosis NOT DETECTED NOT DETECTED Final   Candida tropicalis NOT DETECTED NOT DETECTED Final    Comment: Performed at [facility], [place] [address] [place], [place] [address]  Group A Strep by PCR     Status: None   Collection Time: 01/15/18  8:19 PM  Result Value Ref Range Status   Group A Strep by PCR NOT DETECTED NOT DETECTED Final    Comment: Performed at [facility], [address] [place], [place] [address]  Respiratory Panel by PCR     Status: None   Collection Time: 01/16/18  7:52 AM  Result Value Ref Range Status   Adenovirus NOT DETECTED NOT DETECTED Final   Coronavirus 229E NOT DETECTED NOT DETECTED Final   Coronavirus HKU1 NOT DETECTED NOT DETECTED Final   Coronavirus NL63 NOT DETECTED NOT DETECTED Final   Coronavirus OC43 NOT DETECTED NOT DETECTED Final   Metapneumovirus NOT DETECTED NOT DETECTED Final   Rhinovirus / Enterovirus NOT DETECTED NOT DETECTED Final   Influenza A NOT DETECTED NOT DETECTED Final   Influenza B NOT DETECTED NOT DETECTED Final   Parainfluenza Virus 1 NOT DETECTED NOT DETECTED Final   Parainfluenza Virus 2 NOT DETECTED NOT DETECTED Final   Parainfluenza Virus 3 NOT DETECTED NOT DETECTED Final   Parainfluenza Virus 4 NOT DETECTED NOT DETECTED Final   Respiratory Syncytial Virus NOT DETECTED NOT DETECTED Final   Bordetella pertussis NOT DETECTED NOT DETECTED Final   Chlamydophila pneumoniae NOT DETECTED NOT DETECTED Final   Mycoplasma pneumoniae NOT DETECTED NOT DETECTED Final  MRSA PCR Screening     Status: None   Collection Time: 01/16/18 11:08 AM  Result Value Ref Range Status   MRSA by PCR NEGATIVE NEGATIVE Final    Comment:        The GeneXpert MRSA Assay (FDA approved for NASAL  specimens only), is one component of a comprehensive MRSA colonization surveillance program. It is not intended to diagnose MRSA infection nor to guide or monitor treatment for MRSA infections. Performed at [facility], [place] [address] [place], [place] [address]   Culture, blood (Routine X 2) w Reflex to ID Panel     Status: None   Collection Time: 01/17/18 12:57 PM  Result Value Ref Range Status   Specimen Description   Final    BLOOD LEFT ARM Performed at [place] [address], [place], [place] [address]    Special Requests   Final    BOTTLES DRAWN AEROBIC AND ANAEROBIC Blood Culture adequate volume   Culture   Final    NO GROWTH 5 DAYS Performed at [facility], [place] [address] [place], [place] [address]    Report Status 01/22/2018 FINAL  Final  MRSA PCR Screening     Status: None   Collection Time: 01/25/18  1:05 PM  Result Value Ref Range Status   MRSA by PCR NEGATIVE NEGATIVE Final    Comment:        The GeneXpert MRSA Assay (FDA approved for NASAL specimens only), is one component of a comprehensive MRSA colonization surveillance program. It is not intended to diagnose MRSA infection nor to guide or monitor treatment for MRSA infections. Performed at [facility], [place] [address] [place], [place] [address]          Radiology Studies: Vas US Doppler Pre Cabg  Result Date: 01/24/2018 PREOPERATIVE VASCULAR  EVALUATION  Indications: Pre-AVR. Performing Technologist: Carlos Levering RVT  Examination Guidelines: A complete evaluation includes B-mode imaging, spectral Doppler, color Doppler, and power Doppler as needed of all accessible portions of each vessel. Bilateral testing is considered an integral part of a complete examination. Limited examinations for reoccurring indications may be performed as noted.  Right Carotid Findings: +----------+--------+--------+--------+-----------------------+--------+            PSV cm/sEDV cm/sStenosisDescribe               Comments +----------+--------+--------+--------+-----------------------+--------+ CCA Prox  65      19              smooth and heterogenous         +----------+--------+--------+--------+-----------------------+--------+ CCA Distal76      31              smooth and heterogenous         +----------+--------+--------+--------+-----------------------+--------+ ICA Prox  55      27                                              +----------+--------+--------+--------+-----------------------+--------+ ICA Distal69      37                                              +----------+--------+--------+--------+-----------------------+--------+ ECA       50      10                                              +----------+--------+--------+--------+-----------------------+--------+ Portions of this table do not appear on this page. +----------+--------+-------+--------+------------+           PSV cm/sEDV cmsDescribeArm Pressure +----------+--------+-------+--------+------------+ Subclavian56                     109          +----------+--------+-------+--------+------------+ +---------+--------+--+--------+--+---------+ VertebralPSV cm/s47EDV cm/s22Antegrade +---------+--------+--+--------+--+---------+ Left Carotid Findings: +----------+--------+--------+--------+-----------------------+--------+           PSV cm/sEDV cm/sStenosisDescribe               Comments +----------+--------+--------+--------+-----------------------+--------+ CCA Prox  41      14              smooth and heterogenous         +----------+--------+--------+--------+-----------------------+--------+ CCA Distal78      28              smooth and heterogenous         +----------+--------+--------+--------+-----------------------+--------+ ICA Prox  63      29              smooth and heterogenous          +----------+--------+--------+--------+-----------------------+--------+ ICA Distal48      21                                     tortuous +----------+--------+--------+--------+-----------------------+--------+ ECA       60      15                                              +----------+--------+--------+--------+-----------------------+--------+ +----------+--------+--------+--------+------------+  SubclavianPSV cm/sEDV cm/sDescribeArm Pressure +----------+--------+--------+--------+------------+           103                     111          +----------+--------+--------+--------+------------+ +---------+--------+--+--------+--+---------+ VertebralPSV cm/s43EDV cm/s18Antegrade +---------+--------+--+--------+--+---------+  ABI Findings: +--------+------------------+-----+---------+--------+ Right   Rt Pressure (mmHg)IndexWaveform Comment  +--------+------------------+-----+---------+--------+ [id number]                    triphasic         +--------+------------------+-----+---------+--------+ +--------+------------------+-----+---------+-------+ Left    Lt Pressure (mmHg)IndexWaveform Comment +--------+------------------+-----+---------+-------+ [id number]                    triphasic        +--------+------------------+-----+---------+-------+  Right Doppler Findings: +-----------+--------+-----+---------+-----------------------------------------+ Site       PressureIndexDoppler  Comments                                  +-----------+--------+-----+---------+-----------------------------------------+ Brachial   109          triphasic                                          +-----------+--------+-----+---------+-----------------------------------------+ Radial                  triphasic                                          +-----------+--------+-----+---------+-----------------------------------------+ Ulnar                    triphasic                                          +-----------+--------+-----+---------+-----------------------------------------+ Palmar Arch                      Palmar waveforms are obliterated with                                      radial and ulnar compression              +-----------+--------+-----+---------+-----------------------------------------+  Left Doppler Findings: +-----------+--------+-----+---------+-----------------------------------------+ Site       PressureIndexDoppler  Comments                                  +-----------+--------+-----+---------+-----------------------------------------+ Brachial   111          triphasic                                          +-----------+--------+-----+---------+-----------------------------------------+ Radial                  triphasic                                          +-----------+--------+-----+---------+-----------------------------------------+  Ulnar                   triphasic                                          +-----------+--------+-----+---------+-----------------------------------------+ Palmar Arch                      Palmar waveforms are obliterated with                                      radial and ulnar compression              +-----------+--------+-----+---------+-----------------------------------------+  Summary: Right Carotid: Velocities in the right ICA are consistent with a 1-39% stenosis. Left Carotid: Velocities in the left ICA are consistent with a 1-39% stenosis. Vertebrals: Bilateral vertebral arteries demonstrate antegrade flow.  Electronically signed by Harold Barban MD on 01/24/2018 at 6:54:54 PM.    Final         Scheduled Meds: . benzonatate  200 mg Oral TID  . bisacodyl  5 mg Oral Once  . [START ON 01/26/2018] heparin-papaverine-plasmalyte irrigation   Irrigation To OR  . [START ON 01/26/2018] insulin   Intravenous To OR  . [START ON 01/26/2018]  [facility] Blood Cardioplegia (KBC) lidocaine 2% Syringe (45mL)  13 mL Intracoronary Once  . [START ON 01/26/2018] [facility] Blood Cardioplegia (KBC) lidocaine 2% Syringe (83mL)  13 mL Intracoronary Once  . [START ON 01/26/2018] [facility]ople[facility] ([facility]) mannitol 20% Syringe (11mL)  32 mL Intracoronary Once  . [START ON 01/26/2018] [facility] Blood Cardioplegia ([facility]BC) mannitol 20% Syringe (65mL)  32 mL Intracoronary Once  . [START ON 01/26/2018] magnesium sulfate  40 mEq Other To OR  . [START ON 01/26/2018] metoprolol tartrate  12.5 mg Oral Once  . [START ON 01/26/2018] phenylephrine  30-200 mcg/min Intravenous To OR  . [START ON 01/26/2018] potassium chloride  80 mEq Other To OR  . rifampin  300 mg Oral TID WC  . [START ON 01/26/2018] tranexamic acid  15 mg/kg Intravenous To OR  . [START ON 01/26/2018] tranexamic acid  2 mg/kg Intracatheter To OR  . [START ON 01/26/2018] vancomycin 1000 mg in NS (1000 ml) irrigation for Dr. Roxy Manns case   Irrigation To OR   Continuous Infusions: . sodium chloride 250 mL (01/19/18 0612)  .  ceFAZolin (ANCEF) IV 2 g (01/25/18 0817)  . [START ON 01/26/2018] cefUROXime (ZINACEF)  IV    . [START ON 01/26/2018] cefUROXime (ZINACEF)  IV    . [START ON 01/26/2018] dexmedetomidine    . [START ON 01/26/2018] DOPamine    . [START ON 01/26/2018] epinephrine    . gentamicin 80 mg (01/25/18 1057)  . [START ON 01/26/2018] heparin 30,000 units/NS 1000 mL solution for CELLSAVER    . [START ON 01/26/2018] milrinone    . [START ON 01/26/2018] nitroGLYCERIN    . [START ON 01/26/2018] norepinephrine (LEVOPHED) Adult infusion    . [START ON 01/26/2018] tranexamic acid (CYKLOKAPRON) infusion ([facility])    . [START ON 01/26/2018] vancomycin       LOS: 10 days    Time spent: 35 MINUTES.     Hosie Poisson, MD [facility] Pager [phone]  If 7PM-7AM, please contact night-coverage [email or web address] Password [email or web address] 01/25/2018, 4:06 PM

## 2018-01-25 NOTE — Progress Notes (Signed)
Pharmacy Antibiotic Note  Patrick Walter is a 62 y.o. male admitted on 01/15/2018 with MSSE prosthetic aortic valve endocarditis.  Pharmacy has been consulted for gentamicin dosing.   Day # 6 Gentamicin. Dose adjusted on 11/3 after trough 1.7 mcg/ml on 11/2.   Gentamicin trough level today is 0.9 mcg/ml, at goal. Creatinine stable.  Plan:  Continue Gentamicin 80 mg IV q12hrs for synergy  Target troughs < 1 mcg/ml  Continues on Cefazolin 2gm IV q8hrs and Rifampin 300 mg PO q8hrs  Re-do AVR scheduled for 11/7.  Will follow renal function, OR findings and length of therapy.   Height: 5\' 11"  (180.3 cm) Weight: 174 lb 14.4 oz (79.3 kg) IBW/kg (Calculated) : 75.3  Temp (24hrs), Avg:98.5 F (36.9 C), Min:97.9 F (36.6 C), Max:99.7 F (37.6 C)  Recent Labs  Lab 01/20/18 0330  01/21/18 1831 01/22/18 0303 01/23/18 0418 01/23/18 0729 01/24/18 0337 01/25/18 0321 01/25/18 0952  WBC 14.7*  --   --  17.8*  --  16.1*  --  14.7*  --   CREATININE 1.03   < >  --  0.93 0.97 0.87 0.96 0.99  --   GENTTROUGH  --   --  1.7  --   --   --   --   --  0.9   < > = values in this interval not displayed.    Estimated Creatinine Clearance: 84.5 mL/min (by C-G formula based on SCr of 0.99 mg/dL).    No Known Allergies  Antimicrobials this admission:  Tamiflu x 1 on 10/27 Vancomycin 10/27 >> 10/29 Zosyn 10/27 >> 10/29 Ceftriaxone x 1 on 10/29 Cefazolin 10/29 >>  Rifampin PO 11/1 Gentamicin 11/1>>  Dose adjustments this admission:  11/2  gent trough = 1.7 (1830) 11/3  gent 80mg  IV q8h>> 80mg  IV q12h 11/7  gent trough 0.9 mcg/ml - continue 80 mg IV q12h  Microbiology results:  10/27 Blood x 4: 4/4 Staph epi (BCID: Staph sp, no resistance)l MIC to gent <0.5 10/27 Group A Strep PCR: not detected 10/27 Influenza A/B: neg/neg 10/28 Respiratory panel: negative 10/28 CMV Ab: <0.6 10/28 HIV Ab: NR 10/28 MRSA PCR: negative 10/29 repeat blood cx x 1: negative 11/6 MRSA PCR negative  Thank  you for allowing pharmacy to be a part of this patient's care.  Arty Baumgartner Pager: [phone] or phone: [phone] 01/25/2018 3:40 PM

## 2018-01-25 NOTE — Progress Notes (Deleted)
Patient ID: Patrick Walter, male   DOB: [birth year], 62 y.o.   MRN: [id number]   Talked with wife and her sister this am Will hold yet today on PICC  Pt just had a code blue secondary choking and wants to "regroup"  She is also to have a meeting with MD and Palliative today  Will recheck in am

## 2018-01-26 ENCOUNTER — Inpatient Hospital Stay (HOSPITAL_COMMUNITY): Payer: 59

## 2018-01-26 ENCOUNTER — Encounter (HOSPITAL_COMMUNITY)
Admission: EM | Disposition: A | Discharge status: Home Health | Payer: Self-pay | Source: Home / Self Care | Attending: Thoracic Surgery (Cardiothoracic Vascular Surgery)

## 2018-01-26 ENCOUNTER — Inpatient Hospital Stay (HOSPITAL_COMMUNITY): Payer: 59 | Admitting: Certified Registered Nurse Anesthetist

## 2018-01-26 ENCOUNTER — Encounter (HOSPITAL_COMMUNITY): Payer: Self-pay | Admitting: Thoracic Surgery (Cardiothoracic Vascular Surgery)

## 2018-01-26 DIAGNOSIS — L0591 Pilonidal cyst without abscess: Secondary | ICD-10-CM | POA: Diagnosis present

## 2018-01-26 DIAGNOSIS — Z954 Presence of other heart-valve replacement: Secondary | ICD-10-CM

## 2018-01-26 HISTORY — PX: TEE WITHOUT CARDIOVERSION: SHX5443

## 2018-01-26 HISTORY — PX: AORTIC VALVE REPLACEMENT: SHX41

## 2018-01-26 HISTORY — DX: Presence of other heart-valve replacement: Z95.4

## 2018-01-26 LAB — POCT I-STAT, CHEM 8
BUN: 13 mg/dL (ref 6–20)
BUN: 13 mg/dL (ref 6–20)
BUN: 15 mg/dL (ref 6–20)
BUN: 13 mg/dL (ref 6–20)
BUN: 13 mg/dL (ref 6–20)
BUN: 11 mg/dL (ref 6–20)
BUN: 12 mg/dL (ref 6–20)
BUN: 11 mg/dL (ref 6–20)
BUN: 13 mg/dL (ref 6–20)
CALCIUM ION: 1.26 mmol/L (ref 1.15–1.40)
CALCIUM ION: 1.41 mmol/L — AB (ref 1.15–1.40)
CALCIUM ION: 1.3 mmol/L (ref 1.15–1.40)
CHLORIDE: 104 mmol/L (ref 98–111)
CREATININE: 0.7 mg/dL (ref 0.61–1.24)
CREATININE: 0.7 mg/dL (ref 0.61–1.24)
Calcium, Ion: 1.41 mmol/L — ABNORMAL HIGH (ref 1.15–1.40)
Calcium, Ion: 1.26 mmol/L (ref 1.15–1.40)
Calcium, Ion: 1.12 mmol/L — ABNORMAL LOW (ref 1.15–1.40)
Calcium, Ion: 1.18 mmol/L (ref 1.15–1.40)
Calcium, Ion: 1 mmol/L — ABNORMAL LOW (ref 1.15–1.40)
Calcium, Ion: 1.21 mmol/L (ref 1.15–1.40)
Chloride: 103 mmol/L (ref 98–111)
Chloride: 100 mmol/L (ref 98–111)
Chloride: 103 mmol/L (ref 98–111)
Chloride: 101 mmol/L (ref 98–111)
Chloride: 102 mmol/L (ref 98–111)
Chloride: 102 mmol/L (ref 98–111)
Chloride: 103 mmol/L (ref 98–111)
Chloride: 107 mmol/L (ref 98–111)
Creatinine, Ser: 0.6 mg/dL — ABNORMAL LOW (ref 0.61–1.24)
Creatinine, Ser: 0.7 mg/dL (ref 0.61–1.24)
Creatinine, Ser: 0.7 mg/dL (ref 0.61–1.24)
Creatinine, Ser: 0.6 mg/dL — ABNORMAL LOW (ref 0.61–1.24)
Creatinine, Ser: 0.5 mg/dL — ABNORMAL LOW (ref 0.61–1.24)
Creatinine, Ser: 0.6 mg/dL — ABNORMAL LOW (ref 0.61–1.24)
Creatinine, Ser: 0.5 mg/dL — ABNORMAL LOW (ref 0.61–1.24)
GLUCOSE: 103 mg/dL — AB (ref 70–99)
Glucose, Bld: 109 mg/dL — ABNORMAL HIGH (ref 70–99)
Glucose, Bld: 107 mg/dL — ABNORMAL HIGH (ref 70–99)
Glucose, Bld: 153 mg/dL — ABNORMAL HIGH (ref 70–99)
Glucose, Bld: 172 mg/dL — ABNORMAL HIGH (ref 70–99)
Glucose, Bld: 154 mg/dL — ABNORMAL HIGH (ref 70–99)
Glucose, Bld: 162 mg/dL — ABNORMAL HIGH (ref 70–99)
Glucose, Bld: 119 mg/dL — ABNORMAL HIGH (ref 70–99)
Glucose, Bld: 104 mg/dL — ABNORMAL HIGH (ref 70–99)
HCT: 22 % — ABNORMAL LOW (ref 39.0–52.0)
HCT: 24 % — ABNORMAL LOW (ref 39.0–52.0)
HCT: 23 % — ABNORMAL LOW (ref 39.0–52.0)
HEMATOCRIT: 26 % — AB (ref 39.0–52.0)
HEMATOCRIT: 22 % — AB (ref 39.0–52.0)
HEMATOCRIT: 23 % — AB (ref 39.0–52.0)
HEMATOCRIT: 26 % — AB (ref 39.0–52.0)
HEMATOCRIT: 23 % — AB (ref 39.0–52.0)
HEMATOCRIT: 23 % — AB (ref 39.0–52.0)
HEMOGLOBIN: 8.8 g/dL — AB (ref 13.0–17.0)
HEMOGLOBIN: 7.5 g/dL — AB (ref 13.0–17.0)
HEMOGLOBIN: 7.8 g/dL — AB (ref 13.0–17.0)
HEMOGLOBIN: 8.8 g/dL — AB (ref 13.0–17.0)
HEMOGLOBIN: 7.8 g/dL — AB (ref 13.0–17.0)
Hemoglobin: 8.2 g/dL — ABNORMAL LOW (ref 13.0–17.0)
Hemoglobin: 7.5 g/dL — ABNORMAL LOW (ref 13.0–17.0)
Hemoglobin: 7.8 g/dL — ABNORMAL LOW (ref 13.0–17.0)
Hemoglobin: 7.8 g/dL — ABNORMAL LOW (ref 13.0–17.0)
POTASSIUM: 4.7 mmol/L (ref 3.5–5.1)
POTASSIUM: 4.6 mmol/L (ref 3.5–5.1)
POTASSIUM: 4.5 mmol/L (ref 3.5–5.1)
POTASSIUM: 4.8 mmol/L (ref 3.5–5.1)
Potassium: 4.2 mmol/L (ref 3.5–5.1)
Potassium: 4.4 mmol/L (ref 3.5–5.1)
Potassium: 4.6 mmol/L (ref 3.5–5.1)
Potassium: 4.4 mmol/L (ref 3.5–5.1)
Potassium: 4.3 mmol/L (ref 3.5–5.1)
SODIUM: 136 mmol/L (ref 135–145)
SODIUM: 135 mmol/L (ref 135–145)
SODIUM: 136 mmol/L (ref 135–145)
SODIUM: 139 mmol/L (ref 135–145)
SODIUM: 138 mmol/L (ref 135–145)
Sodium: 136 mmol/L (ref 135–145)
Sodium: 136 mmol/L (ref 135–145)
Sodium: 136 mmol/L (ref 135–145)
Sodium: 137 mmol/L (ref 135–145)
TCO2: 30 mmol/L (ref 22–32)
TCO2: 29 mmol/L (ref 22–32)
TCO2: 30 mmol/L (ref 22–32)
TCO2: 26 mmol/L (ref 22–32)
TCO2: 26 mmol/L (ref 22–32)
TCO2: 25 mmol/L (ref 22–32)
TCO2: 27 mmol/L (ref 22–32)
TCO2: 26 mmol/L (ref 22–32)
TCO2: 23 mmol/L (ref 22–32)

## 2018-01-26 LAB — POCT I-STAT 3, ART BLOOD GAS (G3+)
ACID-BASE EXCESS: 2 mmol/L (ref 0.0–2.0)
ACID-BASE EXCESS: 1 mmol/L (ref 0.0–2.0)
Acid-Base Excess: 3 mmol/L — ABNORMAL HIGH (ref 0.0–2.0)
Acid-base deficit: 2 mmol/L (ref 0.0–2.0)
BICARBONATE: 22.5 mmol/L (ref 20.0–28.0)
Bicarbonate: 26.8 mmol/L (ref 20.0–28.0)
Bicarbonate: 26.6 mmol/L (ref 20.0–28.0)
Bicarbonate: 25.7 mmol/L (ref 20.0–28.0)
O2 SAT: 100 %
O2 Saturation: 98 %
O2 Saturation: 100 %
O2 Saturation: 97 %
PCO2 ART: 39.1 mmHg (ref 32.0–48.0)
PCO2 ART: 42.6 mmHg (ref 32.0–48.0)
PH ART: 7.418 (ref 7.350–7.450)
PH ART: 7.386 (ref 7.350–7.450)
PO2 ART: 185 mmHg — AB (ref 83.0–108.0)
PO2 ART: 93 mmHg (ref 83.0–108.0)
Patient temperature: 36.9
Patient temperature: 36.9
TCO2: 28 mmol/L (ref 22–32)
TCO2: 28 mmol/L (ref 22–32)
TCO2: 27 mmol/L (ref 22–32)
TCO2: 24 mmol/L (ref 22–32)
pCO2 arterial: 40.9 mmHg (ref 32.0–48.0)
pCO2 arterial: 37.5 mmHg (ref 32.0–48.0)
pH, Arterial: 7.445 (ref 7.350–7.450)
pH, Arterial: 7.388 (ref 7.350–7.450)
pO2, Arterial: 305 mmHg — ABNORMAL HIGH (ref 83.0–108.0)
pO2, Arterial: 97 mmHg (ref 83.0–108.0)

## 2018-01-26 LAB — ECHO TEE
HEIGHTINCHES: 71 in
Weight: 2776.03 oz

## 2018-01-26 LAB — POCT I-STAT 4, (NA,K, GLUC, HGB,HCT)
GLUCOSE: 96 mg/dL (ref 70–99)
HCT: 24 % — ABNORMAL LOW (ref 39.0–52.0)
HEMOGLOBIN: 8.2 g/dL — AB (ref 13.0–17.0)
POTASSIUM: 4.5 mmol/L (ref 3.5–5.1)
Sodium: 141 mmol/L (ref 135–145)

## 2018-01-26 LAB — CREATININE, SERUM
Creatinine, Ser: 0.88 mg/dL (ref 0.61–1.24)
GFR calc non Af Amer: >60 mL/min (ref >60)

## 2018-01-26 LAB — CBC
HCT: 26.5 % — ABNORMAL LOW (ref 39.0–52.0)
HCT: 28.6 % — ABNORMAL LOW (ref 39.0–52.0)
HCT: 27.4 % — ABNORMAL LOW (ref 39.0–52.0)
HEMATOCRIT: 30.6 % — AB (ref 39.0–52.0)
HEMOGLOBIN: 9.1 g/dL — AB (ref 13.0–17.0)
HEMOGLOBIN: 8.9 g/dL — AB (ref 13.0–17.0)
Hemoglobin: 8.5 g/dL — ABNORMAL LOW (ref 13.0–17.0)
Hemoglobin: 9 g/dL — ABNORMAL LOW (ref 13.0–17.0)
MCH: 25.1 pg — AB (ref 26.0–34.0)
MCH: 27.7 pg (ref 26.0–34.0)
MCH: 27.1 pg (ref 26.0–34.0)
MCH: 27.9 pg (ref 26.0–34.0)
MCHC: 29.7 g/dL — AB (ref 30.0–36.0)
MCHC: 32.1 g/dL (ref 30.0–36.0)
MCHC: 31.5 g/dL (ref 30.0–36.0)
MCHC: 32.5 g/dL (ref 30.0–36.0)
MCV: 84.5 fL (ref 80.0–100.0)
MCV: 86.3 fL (ref 80.0–100.0)
MCV: 86.1 fL (ref 80.0–100.0)
MCV: 85.9 fL (ref 80.0–100.0)
NRBC: 0 % (ref 0.0–0.2)
NRBC: 0 % (ref 0.0–0.2)
PLATELETS: 115 10*3/uL — AB (ref 150–400)
PLATELETS: 140 10*3/uL — AB (ref 150–400)
Platelets: 368 10*3/uL (ref 150–400)
Platelets: 146 10*3/uL — ABNORMAL LOW (ref 150–400)
RBC: 3.62 MIL/uL — ABNORMAL LOW (ref 4.22–5.81)
RBC: 3.07 MIL/uL — ABNORMAL LOW (ref 4.22–5.81)
RBC: 3.32 MIL/uL — AB (ref 4.22–5.81)
RBC: 3.19 MIL/uL — AB (ref 4.22–5.81)
RDW: 13.3 % (ref 11.5–15.5)
RDW: 13.3 % (ref 11.5–15.5)
RDW: 13.3 % (ref 11.5–15.5)
RDW: 13.5 % (ref 11.5–15.5)
WBC: 14.2 10*3/uL — ABNORMAL HIGH (ref 4.0–10.5)
WBC: 33.1 10*3/uL — ABNORMAL HIGH (ref 4.0–10.5)
WBC: 32 10*3/uL — AB (ref 4.0–10.5)
WBC: 22.6 10*3/uL — ABNORMAL HIGH (ref 4.0–10.5)
nRBC: 0 % (ref 0.0–0.2)
nRBC: 0 % (ref 0.0–0.2)

## 2018-01-26 LAB — APTT
APTT: 46 s — AB (ref 24–36)
APTT: 41 s — AB (ref 24–36)

## 2018-01-26 LAB — PLATELET COUNT: Platelets: 124 10*3/uL — ABNORMAL LOW (ref 150–400)

## 2018-01-26 LAB — BASIC METABOLIC PANEL
Anion gap: 9 (ref 5–15)
BUN: 18 mg/dL (ref 6–20)
CO2: 24 mmol/L (ref 22–32)
CREATININE: 0.96 mg/dL (ref 0.61–1.24)
Calcium: 10.2 mg/dL (ref 8.9–10.3)
Chloride: 103 mmol/L (ref 98–111)
GFR calc Af Amer: >60 mL/min (ref >60)
GFR calc non Af Amer: >60 mL/min (ref >60)
Glucose, Bld: 122 mg/dL — ABNORMAL HIGH (ref 70–99)
Potassium: 4.1 mmol/L (ref 3.5–5.1)
SODIUM: 136 mmol/L (ref 135–145)

## 2018-01-26 LAB — PROTIME-INR
INR: 1.77
INR: 1.65
PROTHROMBIN TIME: 20.4 s — AB (ref 11.4–15.2)
PROTHROMBIN TIME: 19.3 s — AB (ref 11.4–15.2)

## 2018-01-26 LAB — FIBRINOGEN
FIBRINOGEN: 226 mg/dL (ref 210–475)
Fibrinogen: 254 mg/dL (ref 210–475)

## 2018-01-26 LAB — HEMOGLOBIN AND HEMATOCRIT, BLOOD
HEMATOCRIT: 25 % — AB (ref 39.0–52.0)
HEMOGLOBIN: 7.9 g/dL — AB (ref 13.0–17.0)

## 2018-01-26 LAB — MAGNESIUM: Magnesium: 3.4 mg/dL — ABNORMAL HIGH (ref 1.7–2.4)

## 2018-01-26 LAB — PREPARE RBC (CROSSMATCH)

## 2018-01-26 SURGERY — REDO AORTIC VALVE REPLACEMENT (AVR)
Anesthesia: General | Site: Esophagus

## 2018-01-26 MED ORDER — HEPARIN SODIUM (PORCINE) 1000 UNIT/ML IJ SOLN
INTRAMUSCULAR | Status: AC
  Filled 2018-01-26: qty 1

## 2018-01-26 MED ORDER — SODIUM CHLORIDE 0.9% FLUSH
3.0000 mL | Freq: Two times a day (BID) | INTRAVENOUS | Status: DC
  Administered 2018-01-29 – 2018-01-30 (×2): 3 mL via INTRAVENOUS

## 2018-01-26 MED ORDER — MIDAZOLAM HCL (PF) 10 MG/2ML IJ SOLN
INTRAMUSCULAR | Status: AC
  Filled 2018-01-26: qty 2

## 2018-01-26 MED ORDER — CALCIUM CHLORIDE 10 % IV SOLN
INTRAVENOUS | Status: AC
  Filled 2018-01-26: qty 10

## 2018-01-26 MED ORDER — ORAL CARE MOUTH RINSE: 15.0000 mL | OROMUCOSAL | Status: DC

## 2018-01-26 MED ORDER — SODIUM CHLORIDE 0.9% FLUSH: 3.0000 mL | INTRAVENOUS | Status: DC | PRN

## 2018-01-26 MED ORDER — ACETAMINOPHEN 160 MG/5ML PO SOLN: 650.0000 mg | Freq: Once | ORAL | Status: AC

## 2018-01-26 MED ORDER — VECURONIUM BROMIDE 10 MG IV SOLR
INTRAVENOUS | Status: AC
  Filled 2018-01-26: qty 10

## 2018-01-26 MED ORDER — SODIUM CHLORIDE 0.9 % IV SOLN
1.5000 g | Freq: Two times a day (BID) | INTRAVENOUS | Status: DC
  Filled 2018-01-26: qty 1.5

## 2018-01-26 MED ORDER — VECURONIUM BROMIDE 10 MG IV SOLR
INTRAVENOUS | Status: DC | PRN
  Administered 2018-01-26 (×4): 5 mg via INTRAVENOUS

## 2018-01-26 MED ORDER — PHENYLEPHRINE 40 MCG/ML (10ML) SYRINGE FOR IV PUSH (FOR BLOOD PRESSURE SUPPORT)
PREFILLED_SYRINGE | INTRAVENOUS | Status: DC | PRN
  Administered 2018-01-26 (×3): 80 ug via INTRAVENOUS
  Administered 2018-01-26: 120 ug via INTRAVENOUS

## 2018-01-26 MED ORDER — ROCURONIUM BROMIDE 50 MG/5ML IV SOSY
PREFILLED_SYRINGE | INTRAVENOUS | Status: AC
  Filled 2018-01-26: qty 10

## 2018-01-26 MED ORDER — 0.9 % SODIUM CHLORIDE (POUR BTL) OPTIME
TOPICAL | Status: DC | PRN
  Administered 2018-01-26: 5000 mL

## 2018-01-26 MED ORDER — SODIUM CHLORIDE 0.9% FLUSH
10.0000 mL | Freq: Two times a day (BID) | INTRAVENOUS | Status: DC
  Administered 2018-01-27 – 2018-01-28 (×2): 10 mL
  Administered 2018-01-29: 20 mL
  Administered 2018-01-30 – 2018-01-31 (×2): 10 mL

## 2018-01-26 MED ORDER — CEFAZOLIN SODIUM-DEXTROSE 2-4 GM/100ML-% IV SOLN: 2.0000 g | Freq: Three times a day (TID) | INTRAVENOUS | Status: DC

## 2018-01-26 MED ORDER — LACTATED RINGERS IV SOLN: 500.0000 mL | Freq: Once | INTRAVENOUS | Status: DC | PRN

## 2018-01-26 MED ORDER — ANTITHROMBIN III (HUMAN) 500 UNITS IV SOLR
500.0000 [IU] | Freq: Once | INTRAVENOUS | Status: AC
  Administered 2018-01-26: 500 [IU] via INTRAVENOUS
  Filled 2018-01-26: qty 10

## 2018-01-26 MED ORDER — LACTATED RINGERS IV SOLN
INTRAVENOUS | Status: DC | PRN
  Administered 2018-01-26 (×2): via INTRAVENOUS

## 2018-01-26 MED ORDER — ALBUMIN HUMAN 5 % IV SOLN
250.0000 mL | INTRAVENOUS | Status: DC | PRN
  Administered 2018-01-26: 12.5 g via INTRAVENOUS

## 2018-01-26 MED ORDER — FENTANYL CITRATE (PF) 250 MCG/5ML IJ SOLN
INTRAMUSCULAR | Status: DC | PRN
  Administered 2018-01-26: 150 ug via INTRAVENOUS
  Administered 2018-01-26: 100 ug via INTRAVENOUS
  Administered 2018-01-26: 50 ug via INTRAVENOUS
  Administered 2018-01-26: 600 ug via INTRAVENOUS
  Administered 2018-01-26 (×5): 50 ug via INTRAVENOUS
  Administered 2018-01-26: 100 ug via INTRAVENOUS

## 2018-01-26 MED ORDER — MORPHINE SULFATE (PF) 2 MG/ML IV SOLN: 1.0000 mg | INTRAVENOUS | Status: DC | PRN

## 2018-01-26 MED ORDER — SODIUM CHLORIDE 0.9 % IV SOLN
INTRAVENOUS | Status: DC | PRN
  Administered 2018-01-26: 750 mg via INTRAVENOUS

## 2018-01-26 MED ORDER — MORPHINE SULFATE (PF) 2 MG/ML IV SOLN
1.0000 mg | INTRAVENOUS | Status: DC | PRN
  Administered 2018-01-26: 1 mg via INTRAVENOUS
  Administered 2018-01-27: 2 mg via INTRAVENOUS
  Filled 2018-01-26 (×2): qty 1

## 2018-01-26 MED ORDER — PHENYLEPHRINE HCL-NACL 20-0.9 MG/250ML-% IV SOLN
0.0000 ug/min | INTRAVENOUS | Status: DC
  Administered 2018-01-26: 35 ug/min via INTRAVENOUS
  Filled 2018-01-26: qty 250

## 2018-01-26 MED ORDER — HEPARIN SODIUM (PORCINE) 1000 UNIT/ML IJ SOLN
INTRAMUSCULAR | Status: DC | PRN
  Administered 2018-01-26: 10000 [IU] via INTRAVENOUS
  Administered 2018-01-26: 24000 [IU] via INTRAVENOUS

## 2018-01-26 MED ORDER — SODIUM CHLORIDE 0.9 % IV SOLN
INTRAVENOUS | Status: DC | PRN
  Administered 2018-01-26: 10 ug/min via INTRAVENOUS

## 2018-01-26 MED ORDER — SODIUM CHLORIDE 0.45 % IV SOLN
INTRAVENOUS | Status: DC | PRN
  Administered 2018-01-26: 18:00:00 via INTRAVENOUS

## 2018-01-26 MED ORDER — ASPIRIN EC 325 MG PO TBEC
325.0000 mg | DELAYED_RELEASE_TABLET | Freq: Every day | ORAL | Status: DC
  Administered 2018-01-27 – 2018-02-01 (×6): 325 mg via ORAL
  Filled 2018-01-26 (×6): qty 1

## 2018-01-26 MED ORDER — FENTANYL CITRATE (PF) 250 MCG/5ML IJ SOLN
INTRAMUSCULAR | Status: AC
  Filled 2018-01-26: qty 25

## 2018-01-26 MED ORDER — SODIUM CHLORIDE 0.9 % IV SOLN
INTRAVENOUS | Status: DC | PRN
  Administered 2018-01-26: 15:00:00 via INTRAVENOUS

## 2018-01-26 MED ORDER — DOCUSATE SODIUM 100 MG PO CAPS
200.0000 mg | ORAL_CAPSULE | Freq: Every day | ORAL | Status: DC
  Administered 2018-01-27 – 2018-02-01 (×4): 200 mg via ORAL
  Filled 2018-01-26 (×4): qty 2

## 2018-01-26 MED ORDER — OXYCODONE HCL 5 MG PO TABS
5.0000 mg | ORAL_TABLET | ORAL | Status: DC | PRN
  Administered 2018-01-27: 10 mg via ORAL
  Filled 2018-01-26 (×2): qty 2

## 2018-01-26 MED ORDER — FAMOTIDINE IN NACL 20-0.9 MG/50ML-% IV SOLN
20.0000 mg | Freq: Two times a day (BID) | INTRAVENOUS | Status: DC
  Administered 2018-01-26: 20 mg via INTRAVENOUS

## 2018-01-26 MED ORDER — SODIUM CHLORIDE 0.9% IV SOLUTION: Freq: Once | INTRAVENOUS | Status: DC

## 2018-01-26 MED ORDER — NITROGLYCERIN IN D5W 200-5 MCG/ML-% IV SOLN: 0.0000 ug/min | INTRAVENOUS | Status: DC

## 2018-01-26 MED ORDER — POTASSIUM CHLORIDE 10 MEQ/50ML IV SOLN: 10.0000 meq | INTRAVENOUS | Status: AC

## 2018-01-26 MED ORDER — BISACODYL 10 MG RE SUPP: 10.0000 mg | Freq: Every day | RECTAL | Status: DC

## 2018-01-26 MED ORDER — MIDAZOLAM HCL 2 MG/2ML IJ SOLN
INTRAMUSCULAR | Status: AC
  Filled 2018-01-26: qty 2

## 2018-01-26 MED ORDER — SODIUM CHLORIDE 0.9 % IV SOLN
1.5000 g | Freq: Two times a day (BID) | INTRAVENOUS | Status: AC
  Administered 2018-01-26 – 2018-01-28 (×4): 1.5 g via INTRAVENOUS
  Filled 2018-01-26 (×5): qty 1.5

## 2018-01-26 MED ORDER — MIDAZOLAM HCL 2 MG/2ML IJ SOLN: 2.0000 mg | INTRAMUSCULAR | Status: DC | PRN

## 2018-01-26 MED ORDER — ACETAMINOPHEN 650 MG RE SUPP
650.0000 mg | Freq: Once | RECTAL | Status: AC
  Administered 2018-01-26: 650 mg via RECTAL

## 2018-01-26 MED ORDER — SODIUM CHLORIDE 0.9% FLUSH: 10.0000 mL | INTRAVENOUS | Status: DC | PRN

## 2018-01-26 MED ORDER — VANCOMYCIN HCL IN DEXTROSE 1-5 GM/200ML-% IV SOLN
1000.0000 mg | Freq: Once | INTRAVENOUS | Status: AC
  Administered 2018-01-27: 1000 mg via INTRAVENOUS
  Filled 2018-01-26: qty 200

## 2018-01-26 MED ORDER — CHLORHEXIDINE GLUCONATE CLOTH 2 % EX PADS
6.0000 | MEDICATED_PAD | Freq: Every day | CUTANEOUS | Status: DC
  Administered 2018-01-26 – 2018-02-01 (×4): 6 via TOPICAL

## 2018-01-26 MED ORDER — SODIUM CHLORIDE (PF) 0.9 % IJ SOLN
INTRAMUSCULAR | Status: AC
  Filled 2018-01-26: qty 10

## 2018-01-26 MED ORDER — MAGNESIUM SULFATE 4 GM/100ML IV SOLN
4.0000 g | Freq: Once | INTRAVENOUS | Status: AC
  Administered 2018-01-26: 4 g via INTRAVENOUS
  Filled 2018-01-26: qty 100

## 2018-01-26 MED ORDER — CHLORHEXIDINE GLUCONATE 0.12% ORAL RINSE (MEDLINE KIT)
15.0000 mL | Freq: Two times a day (BID) | OROMUCOSAL | Status: DC
  Administered 2018-01-27: 15 mL via OROMUCOSAL

## 2018-01-26 MED ORDER — HEMOSTATIC AGENTS (NO CHARGE) OPTIME
TOPICAL | Status: DC | PRN
  Administered 2018-01-26: 2 via TOPICAL
  Administered 2018-01-26 (×2): 1 via TOPICAL
  Administered 2018-01-26: 2 via TOPICAL
  Administered 2018-01-26: 1 via TOPICAL

## 2018-01-26 MED ORDER — SODIUM CHLORIDE 0.9 % IV SOLN
INTRAVENOUS | Status: DC
  Administered 2018-01-26: 18:00:00 via INTRAVENOUS

## 2018-01-26 MED ORDER — ROCURONIUM BROMIDE 10 MG/ML (PF) SYRINGE
PREFILLED_SYRINGE | INTRAVENOUS | Status: DC | PRN
  Administered 2018-01-26: 50 mg via INTRAVENOUS
  Administered 2018-01-26: 20 mg via INTRAVENOUS
  Administered 2018-01-26: 30 mg via INTRAVENOUS

## 2018-01-26 MED ORDER — ACETAMINOPHEN 160 MG/5ML PO SOLN: 1000.0000 mg | Freq: Four times a day (QID) | ORAL | Status: DC

## 2018-01-26 MED ORDER — ACETAMINOPHEN 500 MG PO TABS
1000.0000 mg | ORAL_TABLET | Freq: Four times a day (QID) | ORAL | Status: AC
  Administered 2018-01-27 – 2018-01-31 (×19): 1000 mg via ORAL
  Filled 2018-01-26 (×20): qty 2

## 2018-01-26 MED ORDER — ASPIRIN 81 MG PO CHEW: 324.0000 mg | CHEWABLE_TABLET | Freq: Every day | ORAL | Status: DC

## 2018-01-26 MED ORDER — LACTATED RINGERS IV SOLN: INTRAVENOUS | Status: DC

## 2018-01-26 MED ORDER — LACTATED RINGERS IV SOLN
INTRAVENOUS | Status: DC | PRN
  Administered 2018-01-26: 07:00:00 via INTRAVENOUS

## 2018-01-26 MED ORDER — PHENYLEPHRINE HCL 10 MG/ML IJ SOLN
INTRAMUSCULAR | Status: AC
  Filled 2018-01-26: qty 2

## 2018-01-26 MED ORDER — PROPOFOL 10 MG/ML IV BOLUS
INTRAVENOUS | Status: DC | PRN
  Administered 2018-01-26: 20 mg via INTRAVENOUS

## 2018-01-26 MED ORDER — CHLORHEXIDINE GLUCONATE 0.12 % MT SOLN
15.0000 mL | OROMUCOSAL | Status: AC
  Administered 2018-01-26: 15 mL via OROMUCOSAL

## 2018-01-26 MED ORDER — MIDAZOLAM HCL 5 MG/5ML IJ SOLN
INTRAMUSCULAR | Status: DC | PRN
  Administered 2018-01-26 (×2): 2 mg via INTRAVENOUS
  Administered 2018-01-26 (×4): 1 mg via INTRAVENOUS
  Administered 2018-01-26 (×2): 2 mg via INTRAVENOUS

## 2018-01-26 MED ORDER — METOPROLOL TARTRATE 5 MG/5ML IV SOLN
2.5000 mg | INTRAVENOUS | Status: DC | PRN
  Administered 2018-01-28: 2.5 mg via INTRAVENOUS
  Filled 2018-01-26: qty 5

## 2018-01-26 MED ORDER — DEXMEDETOMIDINE HCL IN NACL 200 MCG/50ML IV SOLN
0.0000 ug/kg/h | INTRAVENOUS | Status: DC
  Administered 2018-01-26: 0.1 ug/kg/h via INTRAVENOUS
  Filled 2018-01-26: qty 50

## 2018-01-26 MED ORDER — PANTOPRAZOLE SODIUM 40 MG PO TBEC
40.0000 mg | DELAYED_RELEASE_TABLET | Freq: Every day | ORAL | Status: DC
  Administered 2018-01-28 – 2018-01-31 (×4): 40 mg via ORAL
  Filled 2018-01-26 (×4): qty 1

## 2018-01-26 MED ORDER — INSULIN REGULAR(HUMAN) IN NACL 100-0.9 UT/100ML-% IV SOLN: INTRAVENOUS | Status: DC

## 2018-01-26 MED ORDER — PROPOFOL 10 MG/ML IV BOLUS
INTRAVENOUS | Status: AC
  Filled 2018-01-26: qty 20

## 2018-01-26 MED ORDER — PROTAMINE SULFATE 10 MG/ML IV SOLN
INTRAVENOUS | Status: DC | PRN
  Administered 2018-01-26: 280 mg via INTRAVENOUS

## 2018-01-26 MED ORDER — TRAMADOL HCL 50 MG PO TABS
50.0000 mg | ORAL_TABLET | ORAL | Status: DC | PRN
  Administered 2018-01-27: 100 mg via ORAL
  Administered 2018-01-27: 50 mg via ORAL
  Administered 2018-01-29: 100 mg via ORAL
  Filled 2018-01-26: qty 1
  Filled 2018-01-26 (×2): qty 2

## 2018-01-26 MED ORDER — INSULIN REGULAR BOLUS VIA INFUSION
0.0000 [IU] | Freq: Three times a day (TID) | INTRAVENOUS | Status: DC
  Filled 2018-01-26: qty 10

## 2018-01-26 MED ORDER — ALBUMIN HUMAN 5 % IV SOLN
INTRAVENOUS | Status: DC | PRN
  Administered 2018-01-26 (×3): via INTRAVENOUS

## 2018-01-26 MED ORDER — ONDANSETRON HCL 4 MG/2ML IJ SOLN
4.0000 mg | Freq: Four times a day (QID) | INTRAMUSCULAR | Status: DC | PRN
  Administered 2018-01-27 – 2018-01-31 (×6): 4 mg via INTRAVENOUS
  Filled 2018-01-26 (×6): qty 2

## 2018-01-26 MED ORDER — INSULIN ASPART 100 UNIT/ML ~~LOC~~ SOLN: 0.0000 [IU] | SUBCUTANEOUS | Status: DC

## 2018-01-26 MED ORDER — ORAL CARE MOUTH RINSE
15.0000 mL | Freq: Two times a day (BID) | OROMUCOSAL | Status: DC
  Administered 2018-01-26 – 2018-01-27 (×2): 15 mL via OROMUCOSAL

## 2018-01-26 MED ORDER — SODIUM CHLORIDE 0.9 % IV SOLN: 250.0000 mL | INTRAVENOUS | Status: DC

## 2018-01-26 MED ORDER — BISACODYL 5 MG PO TBEC
10.0000 mg | DELAYED_RELEASE_TABLET | Freq: Every day | ORAL | Status: DC
  Administered 2018-01-27 – 2018-02-01 (×4): 10 mg via ORAL
  Filled 2018-01-26 (×4): qty 2

## 2018-01-26 SURGICAL SUPPLY — 94 items
ADAPTER CARDIO PERF ANTE/RETRO (ADAPTER) ×3 IMPLANT
APPLICATOR COTTON TIP 6 STRL (MISCELLANEOUS) ×4 IMPLANT
APPLICATOR COTTON TIP 6IN STRL (MISCELLANEOUS) ×6
BAG DECANTER FOR FLEXI CONT (MISCELLANEOUS) ×9 IMPLANT
BLADE OSCILLATING /SAGITTAL (BLADE) ×6 IMPLANT
BLADE SAW SAG 29X58X.64 (BLADE) IMPLANT
BLADE SURG 11 STRL SS (BLADE) ×12 IMPLANT
BLADE SURG 15 STRL LF DISP TIS (BLADE) ×6 IMPLANT
BLADE SURG 15 STRL SS (BLADE) ×3
CANISTER SUCT 3000ML PPV (MISCELLANEOUS) ×6 IMPLANT
CANNULA AORTIC ROOT 20012 (MISCELLANEOUS) IMPLANT
CANNULA AORTIC ROOT 9FR (CANNULA) ×3 IMPLANT
CANNULA FEM VENOUS REMOTE 22FR (CANNULA) ×3 IMPLANT
CANNULA GUNDRY RCSP 15FR (MISCELLANEOUS) ×3 IMPLANT
CANNULA OPTISITE PERFUSION 18F (CANNULA) ×3 IMPLANT
CANNULA SUMP PERICARDIAL (CANNULA) ×3 IMPLANT
CANNULA VENNOUS METAL TIP 20FR (CANNULA) ×3 IMPLANT
CARDIOBLATE CARDIAC ABLATION (MISCELLANEOUS)
CATH CPB KIT OWEN (MISCELLANEOUS) ×3 IMPLANT
CATH HEART VENT LEFT (CATHETERS) ×2 IMPLANT
CATH ROBINSON RED A/P 18FR (CATHETERS) ×9 IMPLANT
CATH THORACIC 36FR (CATHETERS) ×3 IMPLANT
CONN 3/8X1/2 ST GISH (MISCELLANEOUS) ×6 IMPLANT
CONN Y 3/8X3/8X3/8  BEN (MISCELLANEOUS)
CONN Y 3/8X3/8X3/8 BEN (MISCELLANEOUS) IMPLANT
CONNECTOR 1/2X3/8X1/2 3 WAY (MISCELLANEOUS) ×1
CONNECTOR 1/2X3/8X1/2 3WAY (MISCELLANEOUS) ×2 IMPLANT
CONT SPEC 4OZ CLIKSEAL STRL BL (MISCELLANEOUS) ×3 IMPLANT
COVER PROBE W GEL 5X96 (DRAPES) ×3 IMPLANT
CRADLE DONUT ADULT HEAD (MISCELLANEOUS) ×3 IMPLANT
DEVICE CARDIOBLATE CARDIAC ABL (MISCELLANEOUS) IMPLANT
DRAIN CHANNEL 32F RND 10.7 FF (WOUND CARE) ×3 IMPLANT
DRAPE CARDIOVASCULAR INCISE (DRAPES) ×1
DRAPE INCISE IOBAN 66X45 STRL (DRAPES) ×3 IMPLANT
DRAPE SLUSH/WARMER DISC (DRAPES) IMPLANT
DRAPE SRG 135X102X78XABS (DRAPES) ×2 IMPLANT
DRSG AQUACEL AG ADV 3.5X14 (GAUZE/BANDAGES/DRESSINGS) ×3 IMPLANT
DRSG COVADERM 4X14 (GAUZE/BANDAGES/DRESSINGS) ×3 IMPLANT
ELECT REM PT RETURN 9FT ADLT (ELECTROSURGICAL) ×6
ELECTRODE REM PT RTRN 9FT ADLT (ELECTROSURGICAL) ×4 IMPLANT
FELT TEFLON 1X6 (MISCELLANEOUS) ×6 IMPLANT
GAUZE SPONGE 4X4 12PLY STRL (GAUZE/BANDAGES/DRESSINGS) ×3 IMPLANT
GAUZE SPONGE 4X4 12PLY STRL LF (GAUZE/BANDAGES/DRESSINGS) ×3 IMPLANT
GLOVE ORTHO TXT STRL SZ7.5 (GLOVE) ×6 IMPLANT
GOWN STRL REUS W/ TWL LRG LVL3 (GOWN DISPOSABLE) ×8 IMPLANT
GOWN STRL REUS W/TWL LRG LVL3 (GOWN DISPOSABLE) ×4
HEMOSTAT SURGICEL 2X4 FIBR (HEMOSTASIS) ×3 IMPLANT
INSERT FOGARTY XLG (MISCELLANEOUS) ×3 IMPLANT
KIT BASIN OR (CUSTOM PROCEDURE TRAY) ×3 IMPLANT
KIT CATH SUCT 8FR (CATHETERS) ×3 IMPLANT
KIT DILATOR VASC 18G NDL (KITS) ×3 IMPLANT
KIT DRAINAGE VACCUM ASSIST (KITS) ×3 IMPLANT
KIT SUCTION CATH 14FR (SUCTIONS) ×6 IMPLANT
KIT SUT CK MINI COMBO 4X17 (Prosthesis & Implant Heart) IMPLANT
KIT TURNOVER KIT B (KITS) ×3 IMPLANT
LEAD PACING MYOCARDI (MISCELLANEOUS) ×3 IMPLANT
LINE VENT (MISCELLANEOUS) ×3 IMPLANT
NS IRRIG 1000ML POUR BTL (IV SOLUTION) ×18 IMPLANT
PACK OPEN HEART (CUSTOM PROCEDURE TRAY) ×3 IMPLANT
PAD ARMBOARD 7.5X6 YLW CONV (MISCELLANEOUS) ×6 IMPLANT
PAD DEFIB R2 (MISCELLANEOUS) IMPLANT
PAD ELECT DEFIB RADIOL ZOLL (MISCELLANEOUS) ×6 IMPLANT
POWDER SURGICEL 3.0 GRAM (HEMOSTASIS) ×9 IMPLANT
SENSOR MYOCARDIAL TEMP (MISCELLANEOUS) ×3 IMPLANT
SET CARDIOPLEGIA MPS 5001102 (MISCELLANEOUS) ×3 IMPLANT
SET IRRIG TUBING LAPAROSCOPIC (IRRIGATION / IRRIGATOR) ×3 IMPLANT
SUCKER INTRACARDIAC WEIGHTED (SUCKER) ×3 IMPLANT
SUT BONE WAX W31G (SUTURE) ×3 IMPLANT
SUT ETHIBON 2 0 V 52N 30 (SUTURE) ×6 IMPLANT
SUT ETHIBOND 2 0 SH (SUTURE) ×5 IMPLANT
SUT ETHIBOND 2 0 SH 36X2 (SUTURE) ×4 IMPLANT
SUT ETHIBOND 2 0V4 GREEN (SUTURE) IMPLANT
SUT ETHIBOND 4 0 RB 1 (SUTURE) ×15 IMPLANT
SUT ETHIBOND X763 2 0 SH 1 (SUTURE) ×6 IMPLANT
SUT MNCRL AB 3-0 PS2 18 (SUTURE) ×6 IMPLANT
SUT PDS AB 1 CTX 36 (SUTURE) ×6 IMPLANT
SUT PROLENE 3 0 SH1 36 (SUTURE) ×15 IMPLANT
SUT PROLENE 4 0 RB 1 (SUTURE) ×16
SUT PROLENE 4-0 RB1 .5 CRCL 36 (SUTURE) ×32 IMPLANT
SUT PROLENE 5 0 C 1 36 (SUTURE) ×69 IMPLANT
SUT SILK  1 MH (SUTURE) ×5
SUT SILK 1 MH (SUTURE) ×10 IMPLANT
SUT VIC AB 2-0 CTX 27 (SUTURE) IMPLANT
SYSTEM SAHARA CHEST DRAIN ATS (WOUND CARE) ×3 IMPLANT
TAPE CLOTH SURG 4X10 WHT LF (GAUZE/BANDAGES/DRESSINGS) ×3 IMPLANT
TAPE PAPER 2X10 WHT MICROPORE (GAUZE/BANDAGES/DRESSINGS) ×3 IMPLANT
TOWEL GREEN STERILE (TOWEL DISPOSABLE) ×3 IMPLANT
TOWEL GREEN STERILE FF (TOWEL DISPOSABLE) ×3 IMPLANT
TRAY FOLEY SLVR 16FR TEMP STAT (SET/KITS/TRAYS/PACK) ×3 IMPLANT
TUBE SUCT INTRACARD DLP 20F (MISCELLANEOUS) ×6 IMPLANT
UNDERPAD 30X30 (UNDERPADS AND DIAPERS) ×3 IMPLANT
VALVE AORTIC CRYO 25MM (Prosthesis & Implant Heart) ×3 IMPLANT
VENT LEFT HEART 12002 (CATHETERS) ×3
WATER STERILE IRR 1000ML POUR (IV SOLUTION) ×6 IMPLANT

## 2018-01-26 NOTE — Anesthesia Procedure Notes (Signed)
Arterial Line Insertion Start/End11/09/2017 6:40 AM, 01/26/2018 6:45 AM Performed by: Colin Benton, CRNA, CRNA  Patient location: Pre-op. Preanesthetic checklist: patient identified, IV checked, site marked, risks and benefits discussed, surgical consent, monitors and equipment checked, pre-op evaluation, timeout performed and anesthesia consent Lidocaine 1% used for infiltration Left, radial was placed Catheter size: 20 G Hand hygiene performed , maximum sterile barriers used  and Seldinger technique used Allen's test indicative of satisfactory collateral circulation Attempts: 1 Procedure performed without using ultrasound guided technique. Following insertion, dressing applied and Biopatch. Post procedure assessment: normal and unchanged  Patient tolerated the procedure well with no immediate complications.

## 2018-01-26 NOTE — Procedures (Signed)
Extubation Procedure Note  Patient Details:   Name: Patrick Walter DOB: [birth year] MRN: [id number]   Airway Documentation:    Vent end date: 01/26/18 Vent end time: 2103   Evaluation  O2 sats: stable throughout Complications: No apparent complications Patient did tolerate procedure well. Bilateral Breath Sounds: Clear, Diminished   Yes   Weaning m,echanics done prior to extubation. Pt achieved 1.4L on VC and -40 on NIF with good pt effort. Pt had positive cuff leak and able to speak after extubation. Voice hoarse and pt was placed on 4L NCAN.   Darryl Nestle F 01/26/2018, 9:12 PM

## 2018-01-26 NOTE — Progress Notes (Signed)
      [address]       [place] [address]             [phone]     CARDIOTHORACIC SURGERY PROGRESS NOTE  Subjective: Patrick Walter has been scheduled for REDO AORTIC VALVE REPLACEMENT today.   Objective: Vital signs in last 24 hours: Temp:  [98 F (36.7 C)-99.2 F (37.3 C)] 98.4 F (36.9 C) (11/07 0514) Pulse Rate:  [86-132] 106 (11/07 0550) Cardiac Rhythm: Heart block;Bundle branch block (11/07 0204) Resp:  [14-30] 30 (11/07 0514) BP: (94-121)/(67-82) 121/76 (11/07 0514) SpO2:  [92 %-97 %] 92 % (11/07 0514) Weight:  [78.7 kg] 78.7 kg (11/07 0550)  Physical Exam: Unchanged from previously   Intake/Output from previous day: No intake/output data recorded. Intake/Output this shift: No intake/output data recorded.  Lab Results: Recent Labs    01/25/18 0321 01/26/18 0333  WBC 14.7* 14.2*  HGB 9.6* 9.1*  HCT 31.1* 30.6*  PLT 346 368   BMET:  Recent Labs    01/25/18 0321 01/26/18 0333  NA 134* 136  K 4.3 4.1  CL 101 103  CO2 26 24  GLUCOSE 116* 122*  BUN 16 18  CREATININE 0.99 0.96  CALCIUM 10.3 10.2    CBG (last 3)  No results for input(s): GLUCAP in the last 72 hours. PT/INR:   Recent Labs    01/25/18 0321  LABPROT 14.8  INR 1.17    Assessment/Plan:   The various methods of treatment have been discussed with the patient. After consideration of the risks, benefits and treatment options the patient has consented to the planned procedure.   The patient has been seen and labs reviewed. There are no changes in the patient's condition to prevent proceeding with the planned procedure today.   [name], MD 01/26/2018 5:54 AM

## 2018-01-26 NOTE — Brief Op Note (Signed)
01/15/2018 - 01/26/2018  4:45 PM  PATIENT:  Patrick Walter  62 y.o. male  PRE-OPERATIVE DIAGNOSIS:  AV VEGETATION  POST-OPERATIVE DIAGNOSIS:  AV VEGETATION  PROCEDURE:  Procedure(s): REDO STERNOTOMY, REDO AORTIC VALVE REPLACEMENT WITH HUMAN ALLOGRAFT INCLUDING AORTIC ROOT REPLACEMENT,  REPAIR ASCENDING THORACIC AORTIC ANEURYSM (N/A) TRANSESOPHAGEAL ECHOCARDIOGRAM (TEE) (N/A) #25 HOMOGRAFT  SURGEON:  Surgeon(s) and Role:    * [name], MD - Primary  PHYSICIAN ASSISTANT: WAYNE GOLD PA-C  ANESTHESIA:   general  EBL:  1300 mL   DRAINS: mediastinal chest tubes x2  LOCAL MEDICATIONS USED:  NONE  SPECIMEN:  Source of Specimen:  AORTIC VALVE AND AORTIC ANEURYSM  DISPOSITION OF SPECIMEN:  PATHOLOGY  COUNTS:  YES  TOURNIQUET:  * No tourniquets in log *  DICTATION: .Dragon Dictation  PLAN OF CARE: Admit to inpatient   PATIENT DISPOSITION:  ICU - intubated and hemodynamically stable.   Delay start of Pharmacological VTE agent (>24hrs) due to surgical blood loss or risk of bleeding: yes  COMPLICATIONS: NO KNOWN

## 2018-01-26 NOTE — Op Note (Addendum)
CARDIOTHORACIC SURGERY OPERATIVE NOTE  Date of Procedure:  01/26/2018  Preoperative Diagnosis:   Prosthetic Valve Endocarditis  S/P Aortic Valve Replacement using a Bioprosthetic Tissue Valve   Ascending Thoracic Aortic Aneurysm  Postoperative Diagnosis: Same   Procedure:    Redo Aortic Valve Replacement  Redo Median Sternotomy  Human Allograft Aortic Root Replacement ([employer] aortic root graft, ID #[id number], size 25 mm)  Reimplantation of Left Main and Right Coronary Arteries  Resection and Grafting of Ascending Thoracic Aortic Aneurysm   Surgeon: Valentina Gu. Roxy Manns, MD  Assistant: John Giovanni, PA-C  Anesthesia: Midge Minium, MD  Operative Findings:    Active prosthetic valve endocarditis with multiple large vegetations and annular abscess  Normal left ventricular systolic function                 BRIEF CLINICAL NOTE AND INDICATIONS FOR SURGERY  [name] status post aortic valve replacement using a bioprosthetic tissue valve in 2015 for bicuspid aortic valve disease with severe symptomatic aortic stenosis who has been referred for surgical consultation to discuss treatment options for management of prosthetic valve endocarditis.  Patient is well-known to me from his previous surgery in 2015.  At the time he presented with symptoms of exertional chest tightness and he was diagnosed with bicuspid aortic valve disease and severe aortic stenosis.  He underwent elective aortic valve replacement using a 25 mm Edwards magna ease stented bovine pericardial tissue valve on December 05, 2013.  His postoperative convalescence was entirely uneventful.  Since then he has done quite well.  He got back into regular physical exercise and has even run a marathon.  He previously had been moderately obese, but over the past 4 years he lost nearly 100 pounds in weight.  He was last seen here in our office on December 13, 2016 at which time  he was doing remarkably well.  He states that little over a month ago he began to experience low-grade fevers, body aches, and generalized malaise.  He thought he had come down with a case of the flu.  Symptoms seem to wax and wane initially but they have persisted and gotten worse.  He lost his appetite although he did not experience significant weight loss.  He presented to the emergency department January 16, 2018 with fever greater than 102 and leukocytosis with white blood count 16,800.  He was mildly hypotensive.  4 of 4 subsequently drawn blood cultures were positive for methicillin sensitive Staphylococcus epidermidis.  He was hospitalized and seen in consultation by Dr. Johnnye Sima from the infectious disease team.  Transesophageal echocardiogram revealed vegetations on the aortic valve consistent with prosthetic valve endocarditis.  Cardiothoracic surgical consultation was requested.  The patient has been seen in consultation and counseled at length regarding the indications, risks and potential benefits of surgery.  All questions have been answered, and the patient provides full informed consent for the operation as described.    DETAILS OF THE OPERATIVE PROCEDURE  Preparation:  The patient is brought to the operating room on the above mentioned date and central monitoring was established by the anesthesia team including placement of Swan-Ganz catheter and radial arterial line. The patient is placed in the supine position on the operating table.  Intravenous antibiotics are administered. General endotracheal anesthesia is induced uneventfully. A Foley catheter is placed.  Baseline transesophageal echocardiogram was performed.  Findings were notable for obvious findings consistent with prosthetic valve endocarditis.  There were bulky vegetations adherent to  the bioprosthetic tissue valve in aortic position.  The majority of the vegetations appear to be on the ventricular surface of the leaflets.   There was some suggestion of possible annular abscess formation, particularly near the commissure between the left and noncoronary leaflets.  There was only trivial aortic insufficiency.  The bulky vegetations actually appeared to create some obstruction to left ventricular outflow tract flow.  There was normal left ventricular systolic function.  The mitral valve appeared normal.  No other abnormalities were appreciated.  The patient's chest, abdomen, both groins, and both lower extremities are prepared and draped in a sterile manner. A time out procedure is performed.   Surgical Approach:  A redo median sternotomy incision was performed.  All of the sternal wires were removed.  The sternum was divided and using a sagittal saw.  Sternal reentry was uneventful.  Sharp dissection and electrocautery was utilized to dissect away the tissues of the anterior mediastinum from the undersurface of the sternum.  This was continued in both directions until a retractor could easily be placed.  Dissection was then continued in the anterior mediastinum until the ascending aorta was identified.  The aorta was dissected to the level of the innominate vein.   Extracorporeal Cardiopulmonary Bypass and Myocardial Protection:  The right common femoral vein is cannulated with Seldinger technique under ultrasound guidance and a flexible guidewire advanced under TEE guidance through the right atrium into the superior vena cava.  The patient is heparinized systemically.  A 22 French long femoral venous cannula is advanced under TEE guidance.  The transverse aortic arch is cannulated with Seldinger technique using an 18 French femoral arterial cannula.  Adequate heparinization is verified.     Initial ACT level was relatively low.  Additional heparin was administrated.  Ultimately recombinant Antithrombin III was administered to achieve satisfactory ACT levels.  Cardiopulmonary bypass is initiated.  A combination of sharp  dissection and electrocautery is utilized to free up the undersurface of the right ventricle and the anterior surface of the right ventricle.  The entire a sending aorta is dissected away from associated structures.  There is mild aneurysmal enlargement of the ascending thoracic aorta.  No attempt is made to free up the right atrium as there is dense adhesions between the right atrium and the surrounding tissues and right lung.  The superior vena cava is dissected away from associated structures and a second venous cannula placed directly into the superior vena cava.  A retrograde cardioplegia cannula was placed through the right atrium into the coronary sinus.  An antegrade cardioplegia cannula was placed in the distal ascending thoracic aorta.  The operative field was continuously flooded with carbon dioxide gas.  The patient is cooled to 32C systemic temperature.  The aortic cross clamp is applied and cardioplegia is delivered initially in an antegrade fashion through the aortic root using modified [place] cold blood cardioplegia ([facility] blood cardioplegia protocol).   The initial cardioplegic arrest is rapid with early diastolic arrest.  Repeat doses of cardioplegia are administered at 90 minutes and every 30 minutes thereafter through the coronary sinus catheter in order to maintain completely flat electrocardiogram.  Myocardial protection was felt to be excellent.   Redo Aortic Valve Replacement:  Dissection is continued proximally into the aortic root.  An oblique aortotomy incision is performed.  The bioprosthetic tissue valve in aortic position is carefully examined.  There are obvious bulky vegetations appreciated.  Swab cultures were obtained.  Portions of vegetation are sent  separately to pathology for examination and culture.  Opening the valves there are an unusually large amount of bulky vegetations on the ventricular surface of the valve.  These are associated with some clot material.   All of this vegetation material is debrided and removed.  Sharp dissection is utilized to explant the infected valve.  During explantation there were 2 different areas where there is early abscess formation appreciated.  Once the valve is been completely removed all of the associated suture material and pledgets are removed.  All of the infected and devitalized tissue is debrided sharply. This is tedious and requires extensive debridement throughout the entire annulus and left ventricular outflow tract.  Once debridement is completed the entire aortic root is irrigated with 2 L of cold saline solution.  The tissues were subsequently painted with Betadine solution.  The annulus is carefully examined.  The tissue destruction and abscess formation appears too extensive to facilitate conventional valve replacement and a decision is made to proceed with human allograft aortic root replacement.    The aorta is transected and dissection is continued to mobilize the left main and the right coronary arteries on independent buttons of aortic tissue.  This dissection is challenging and quite tedious due to the extensive fibrosis surrounding the aortic root.  The aortic root is sized to 27 mm diameter.  A 25 mm human allograft aortic root graft ([employer] ID# [id number]) is thawed per routine and prepared for implantation.  The proximal end of the graft is trimmed to remove excess muscle and the anterior leaflet of the mitral valve.  The proximal suture line is constructed using interrupted 4-0 Ethibond sutures placed circumferentially around the entire aortic root.  The left main coronary artery was reimplanted onto the left sinus of Valsalva of the allograft aortic root graft using interrupted 5-0 Prolene suture.  The right coronary artery was reimplanted to the right sinus of Valsalva using running 5-0 Prolene suture.   Resection and Grafting of Ascending Thoracic Aortic Aneurysm:  The proximal ascending thoracic  aorta was resected to a level 2 cm below the aortic cross-clamp, thereby removing the mildly aneurysmal segment.  The distal end of the aortic root graft was trimmed and beveled to an appropriate length and the distal anastomosis completed using running 4-0 Prolene suture.  One final dose of warm retrograde "reanimation dose" cardioplegia was administered retrograde through the coronary sinus catheter while all air was evacuated through the aortic graft.  The aortic cross clamp was removed after a total cross clamp time of 224 minutes.   Procedure Completion:  Epicardial pacing wires are fixed to the right ventricular outflow tract and to the right atrial appendage. The patient is rewarmed to 37C temperature.  The aortic graft is carefully inspected for hemostasis.  The superior vena cava cannula is removed.  The patient is weaned and disconnected from cardiopulmonary bypass.  The patient's rhythm at separation from bypass was AV paced.  The patient was weaned from cardiopulmonary bypass without any inotropic support. Total cardiopulmonary bypass time for the operation was 297 minutes.  Followup transesophageal echocardiogram performed after separation from bypass revealed a normal-appearing aortic valve that was functioning normally.  There was no aortic insufficiency.  Left ventricular function was unchanged from preoperatively.  The aortic cannula was removed uneventfully. Protamine was administered to reverse the anticoagulation.  The femoral venous cannula was removed and manual pressure held on the groin for 30 minutes.  The mediastinum was inspected for hemostasis and irrigated with  saline solution.  There was moderate coagulopathy.  The patient received a total of 1 pack adult platelets and 4 units fresh frozen plasma due to coagulopathy and thrombocytopenia after separation from cardiopulmonary bypass and reversal of heparin with protamine.  The mediastinum was drained using 2 chest tubes  placed through separate stab incisions inferiorly.  The soft tissues anterior to the aorta were reapproximated loosely. The sternum is closed with double strength sternal wire. The soft tissues anterior to the sternum were closed in multiple layers and the skin is closed with a running subcuticular skin closure.  The post-bypass portion of the operation was notable for stable rhythm and hemodynamics.  The patient received 4 units packed red blood cells during the procedure due to anemia which was present preoperatively and exacerbated by acute blood loss and hemodilution during cardiopulmonary bypass.   Disposition:  The patient tolerated the procedure well and is transported to the surgical intensive care in stable condition. There are no intraoperative complications. All sponge instrument and needle counts are verified correct at completion of the operation.    Valentina Gu. Roxy Manns MD 01/26/2018 4:49 PM

## 2018-01-26 NOTE — Progress Notes (Signed)
  Echocardiogram Echocardiogram Transesophageal has been performed.  Patrick Walter 01/26/2018, 9:12 AM

## 2018-01-26 NOTE — Progress Notes (Signed)
PROGRESS NOTE    Patrick Walter  SAY:[id number] DOB: 28-Jun-1955 DOA: 01/15/2018 PCP: [name], MD  Brief Narrative:  Patrick Walter a 62 y.o.malewithhistory of bicuspid aortic valve status post bioprosthetic valve replacement in 2015 and at that time patient also has had PFO closure also receives injection for right hip osteoarthritis last one was last month has had recently traveled to [place] last week for sports event presents to the ER with complaints of having fever and chills with generalized body ache for the last 2 days.  Patient found to have sepsis secondary to methicillin susceptible Staphylococcus epidermidis.  TEE revealed large vegetation on prosthetic aortic valve.  Cardiology as well as cardiothoracic surgery consulted and following. Plan for surgery on 01/26/2018.    Assessment & Plan:   Principal Problem:   Prosthetic valve endocarditis (HCC) Active Problems:   Essential hypertension, benign   S/P aortic valve replacement with bioprosthetic valve   Primary osteoarthritis of left hip   Hypercalcemia   Normocytic anemia   Staphylococcus epidermidis bacteremia   Septic embolism (HCC)   Pilonidal cyst   Sepsis sec to MSSE/ PV endocarditis.  - h/o dental work in August and intra articular steroid injection last month.  - ID consulted and recommendations given.  - pt transferred to [facility] and underwent TEE, showing large vegetation on the prosthetic aortic valve. Blood cultures from 10/29 no growth till date.  - cardiothoracic surgery consulted and underwent redo of the AV replacement.  Post op patient is on vent and on cardio thoracic surgery service.  TRH will sign off.   Septic emboli on MRI brain : 2 tiny cortical infarct suspected in the left parietal lobe suspicious for septic emboli. Discussed with Dr Leonel Ramsay , anti thrombotic are not indicated.   CT abd and pelvis showing abrupt embolic occlusion of the SMA, Abrupt embolic occlusion  of the distal splenic artery artery with multiple splenic infarcts, small left renal infarct, 4.2 cm AAA.  No need for anti coagulation as per Dr Donnetta Hutching and the patient remains asymptomatic.    Anemia of chronic disease Transfuse to keep hemoglobin greater than 7.    Pain foramen ovale/ Ascending aortic enlargement/ moderate diastolic CHF - noted on echo in 2018, on TEE. LVEF is 60%  Pilonidal cyst:  Surgery consulted and he underwent excision of chronic pilonidal sinus tract.  Cleanse daily.    DVT prophylaxis: lovenox.  Code Status: full code.  Family Communication: family at bedside.  Disposition Plan: pending cardiothoracic surgery.    Consultants:   Cardiothoracic surgery.   Infectious disease.   Cardiology  General surgery.   Neurology   Vascular surgery.    Procedures: TEE  Excision of chronic pilonidal sinus tract.    Antimicrobials:  Ancef and gentamicin as per ID.   Subjective: Pt on vent.   Objective: Vitals:   01/25/18 2005 01/25/18 2352 01/26/18 0514 01/26/18 0550  BP: 105/76 108/82 121/76   Pulse: 88  (!) 132 (!) 106  Resp: (!) 23 17 (!) 30   Temp: 99.2 F (37.3 C) 98.8 F (37.1 C) 98.4 F (36.9 C)   TempSrc: Oral Oral Oral   SpO2: 97% 97% 92%   Weight:    78.7 kg  Height:        Intake/Output Summary (Last 24 hours) at 01/26/2018 1553 Last data filed at 01/26/2018 1549 Gross per 24 hour  Intake 2880 ml  Output 700 ml  Net 2180 ml   Filed Weights   01/15/18  1923 01/25/18 0451 01/26/18 0550  Weight: 80.7 kg 79.3 kg 78.7 kg    Examination:  General exam: Appears calm and comfortable  Respiratory system: Clear to auscultation. Respiratory effort normal. Cardiovascular system: S1 & S2 heard, RRR. No JVD,  No pedal edema. Gastrointestinal system: Abdomen is nondistended, soft and non tender Central nervous system: sedated.     Data Reviewed: I have personally reviewed following labs and imaging studies  CBC: Recent Labs    Lab 2018-01-31 0330 01/22/18 0303 01/23/18 0729 01/25/18 0321 01/26/18 0333 01/26/18 1416  WBC 14.7* 17.8* 16.1* 14.7* 14.2*  --   HGB 9.5* 8.8* 9.1* 9.6* 9.1* 7.9*  HCT 32.3* 29.9* 29.9* 31.1* 30.6* 25.0*  MCV 88.0 86.2 85.2 84.5 84.5  --   PLT 356 363 368 346 368 992*   Basic Metabolic Panel: Recent Labs  Lab 2018/01/31 0330  01/22/18 0303 01/23/18 0418 01/23/18 0729 01/24/18 0337 01/25/18 0321 01/26/18 0333  NA 139  --  135  --  134*  --  134* 136  K 4.4  --  4.1  --  4.0  --  4.3 4.1  CL 105  --  105  --  105  --  101 103  CO2 28  --  25  --  26  --  26 24  GLUCOSE 117*  --  100*  --  102*  --  116* 122*  BUN 10  --  15  --  11  --  16 18  CREATININE 1.03   < > 0.93 0.97 0.87 0.96 0.99 0.96  CALCIUM 10.4*  --  9.5  --  10.1  --  10.3 10.2   < > = values in this interval not displayed.   GFR: Estimated Creatinine Clearance: 87.2 mL/min (by C-G formula based on SCr of 0.96 mg/dL). Liver Function Tests: Recent Labs  Lab Jan 31, 2018 0330 01/25/18 0321  AST 40 18  ALT 40 15  ALKPHOS 98 94  BILITOT 0.6 0.7  PROT 6.3* 6.2*  ALBUMIN 2.8* 2.8*   No results for input(s): LIPASE, AMYLASE in the last 168 hours. No results for input(s): AMMONIA in the last 168 hours. Coagulation Profile: Recent Labs  Lab 01/25/18 0321  INR 1.17   Cardiac Enzymes: No results for input(s): CKTOTAL, CKMB, CKMBINDEX, TROPONINI in the last 168 hours. BNP (last 3 results) No results for input(s): PROBNP in the last 8760 hours. HbA1C: Recent Labs    01/25/18 1244  HGBA1C 5.5   CBG: No results for input(s): GLUCAP in the last 168 hours. Lipid Profile: No results for input(s): CHOL, HDL, LDLCALC, TRIG, CHOLHDL, LDLDIRECT in the last 72 hours. Thyroid Function Tests: No results for input(s): TSH, T4TOTAL, FREET4, T3FREE, THYROIDAB in the last 72 hours. Anemia Panel: No results for input(s): VITAMINB12, FOLATE, FERRITIN, TIBC, IRON, RETICCTPCT in the last 72 hours. Sepsis Labs: No  results for input(s): PROCALCITON, LATICACIDVEN in the last 168 hours.  Recent Results (from the past 240 hour(s))  Culture, blood (Routine X 2) w Reflex to ID Panel     Status: None   Collection Time: 01/17/18 12:57 PM  Result Value Ref Range Status   Specimen Description   Final    BLOOD LEFT ARM Performed at [facility] [address] [place], [place] [address]    Special Requests   Final    BOTTLES DRAWN AEROBIC AND ANAEROBIC Blood Culture adequate volume   Culture   Final    NO GROWTH 5  DAYS Performed at [facility], [place] [address] [place], [place] [address]    Report Status 01/22/2018 FINAL  Final  MRSA PCR Screening     Status: None   Collection Time: 01/25/18  1:05 PM  Result Value Ref Range Status   MRSA by PCR NEGATIVE NEGATIVE Final    Comment:        The GeneXpert MRSA Assay (FDA approved for NASAL specimens only), is one component of a comprehensive MRSA colonization surveillance program. It is not intended to diagnose MRSA infection nor to guide or monitor treatment for MRSA infections. Performed at [facility], [place] [address] [place], [place] [address]   Aerobic/Anaerobic Culture (surgical/deep wound)     Status: None (Preliminary result)   Collection Time: 01/26/18 10:53 AM  Result Value Ref Range Status   Specimen Description TISSUE AORTA VALVE VEGETATION  Final   Special Requests NONE  Final   Gram Stain   Final    RARE WBC PRESENT,BOTH PMN AND MONONUCLEAR RARE GRAM POSITIVE COCCI Performed at [facility], [place] [address], [place], [place] [address]    Culture PENDING  Incomplete   Report Status PENDING  Incomplete  Aerobic/Anaerobic Culture (surgical/deep wound)     Status: None (Preliminary result)   Collection Time: 01/26/18 10:58 AM  Result Value Ref Range Status   Specimen Description WOUND AORTA VALVE VEGETATION  Final   Special Requests SPEC B ON SWABS  Final   Gram Stain   Final    NO WBC SEEN NO  ORGANISMS SEEN Performed at [facility], [address], [place], [place] [address]    Culture PENDING  Incomplete   Report Status PENDING  Incomplete         Radiology Studies: No results found.      Scheduled Meds: . sodium chloride   Intravenous Once  . sodium chloride   Intravenous Once  . [MAR Hold] benzonatate  200 mg Oral TID  . bisacodyl  5 mg Oral Once  . heparin-papaverine-plasmalyte irrigation   Irrigation To OR  . [facility] Blood Cardioplegia (KBC) lidocaine 2% Syringe (21mL)  13 mL Intracoronary Once  . [place] Blood Cardioplegia (KBC) lidocaine 2% Syringe (55mL)  13 mL Intracoronary Once  . [facility] Blood Cardioplegia (KBC) mannitol 20% Syringe (57mL)  32 mL Intracoronary Once  . [facility] Blood Cardioplegia (KBC) mannitol 20% Syringe (48mL)  32 mL Intracoronary Once  . magnesium sulfate  40 mEq Other To OR  . potassium chloride  80 mEq Other To OR  . [MAR Hold] rifampin  300 mg Oral TID WC  . tranexamic acid  2 mg/kg Intracatheter To OR   Continuous Infusions: . [MAR Hold] sodium chloride 250 mL (01/19/18 0612)  . [MAR Hold]  ceFAZolin (ANCEF) IV 2 g (01/26/18 0240)  . cefUROXime (ZINACEF)  IV    . DOPamine    . epinephrine    . [MAR Hold] gentamicin 80 mg (01/26/18 0002)  . heparin 30,000 units/NS 1000 mL solution for CELLSAVER    . milrinone    . nitroGLYCERIN    . norepinephrine (LEVOPHED) Adult infusion       LOS: 11 days    Time spent: 15 MINUTES.     Hosie Poisson, MD [facility] Pager [phone]  If 7PM-7AM, please contact night-coverage [email or web address] Password [facility] 01/26/2018, 3:53 PM

## 2018-01-26 NOTE — Anesthesia Procedure Notes (Addendum)
Central Venous Catheter Insertion Performed by: Nolon Nations, MD, anesthesiologist Start/End11/09/2017 6:25 AM, 01/26/2018 6:40 AM Patient location: Pre-op. Preanesthetic checklist: patient identified, IV checked, site marked, risks and benefits discussed, surgical consent, monitors and equipment checked, pre-op evaluation, timeout performed and anesthesia consent Position: Trendelenburg Lidocaine 1% used for infiltration and patient sedated Hand hygiene performed , maximum sterile barriers used  and Seldinger technique used Catheter size: 9 Fr Total catheter length 10. Central line was placed.MAC introducer Swan type:thermodilution PA Cath depth:48 Procedure performed using ultrasound guided technique. Ultrasound Notes:anatomy identified, needle tip was noted to be adjacent to the nerve/plexus identified, no ultrasound evidence of intravascular and/or intraneural injection and image(s) printed for medical record Attempts: 1 Following insertion, line sutured, dressing applied and Biopatch. Post procedure assessment: blood return through all ports, free fluid flow and no air  Patient tolerated the procedure well with no immediate complications.

## 2018-01-26 NOTE — Transfer of Care (Signed)
Immediate Anesthesia Transfer of Care Note  Patient: Patrick Walter  Procedure(s) Performed: REDO STERNOTOMY, REDO AORTIC VALVE REPLACEMENT WITH HUMAN ALLOGRAFT INCLUDING AORTIC ROOT REPLACEMENT,  REPAIR ASCENDING THORACIC AORTIC ANEURYSM (N/A Chest) TRANSESOPHAGEAL ECHOCARDIOGRAM (TEE) (N/A Esophagus)  Patient Location: SICU  Anesthesia Type:General  Level of Consciousness: Patient remains intubated per anesthesia plan  Airway & Oxygen Therapy: Patient remains intubated per anesthesia plan and Patient placed on Ventilator (see vital sign flow sheet for setting)  Post-op Assessment: Report given to RN and Post -op Vital signs reviewed and stable  Post vital signs: Reviewed and stable  Last Vitals:  Vitals Value Taken Time  BP 108/85 01/26/2018  5:25 PM  Temp 36.4 C 01/26/2018  5:28 PM  Pulse 80 01/26/2018  5:28 PM  Resp 0 01/26/2018  5:28 PM  SpO2 99 % 01/26/2018  5:28 PM  Vitals shown include unvalidated device data.  Last Pain:  Vitals:   01/26/18 0514  TempSrc: Oral  PainSc:       Patients Stated Pain Goal: 0 (49/82/64 1583)  Complications: No apparent anesthesia complications

## 2018-01-26 NOTE — Progress Notes (Signed)
CTS PM Rounds Homograft for endocarditis Sedated on vent Stable BP on neo Not bleeding A-V pacemaker dependent

## 2018-01-26 NOTE — Plan of Care (Signed)
  Problem: Activity: Goal: Risk for activity intolerance will decrease Outcome: Progressing   Problem: Cardiac: Goal: Will achieve and/or maintain hemodynamic stability Outcome: Progressing   Problem: Clinical Measurements: Goal: Postoperative complications will be avoided or minimized Outcome: Progressing   Problem: Respiratory: Goal: Respiratory status will improve Outcome: Progressing   Problem: Skin Integrity: Goal: Risk for impaired skin integrity will decrease Outcome: Progressing   Problem: Urinary Elimination: Goal: Ability to achieve and maintain adequate renal perfusion and functioning will improve Outcome: Progressing

## 2018-01-26 NOTE — Anesthesia Procedure Notes (Signed)
Procedure Name: Intubation Date/Time: 01/26/2018 8:02 AM Performed by: Colin Benton, CRNA Pre-anesthesia Checklist: Patient identified, Emergency Drugs available, Suction available and Patient being monitored Patient Re-evaluated:Patient Re-evaluated prior to induction Oxygen Delivery Method: Circle system utilized Preoxygenation: Pre-oxygenation with 100% oxygen Induction Type: IV induction Ventilation: Mask ventilation without difficulty and Oral airway inserted - appropriate to patient size Laryngoscope Size: Sabra Heck and 2 Grade View: Grade I Tube type: Oral Tube size: 8.0 mm Number of attempts: 1 Airway Equipment and Method: Stylet Placement Confirmation: ETT inserted through vocal cords under direct vision,  positive ETCO2 and breath sounds checked- equal and bilateral Secured at: 24 cm Tube secured with: Tape Dental Injury: Teeth and Oropharynx as per pre-operative assessment

## 2018-01-26 NOTE — Anesthesia Procedure Notes (Signed)
Central Venous Catheter Insertion Performed by: Nolon Nations, MD, anesthesiologist Start/End11/09/2017 6:40 AM, 01/26/2018 6:50 AM Patient location: Pre-op. Preanesthetic checklist: patient identified, IV checked, site marked, risks and benefits discussed, surgical consent, monitors and equipment checked, pre-op evaluation, timeout performed and anesthesia consent Hand hygiene performed  and maximum sterile barriers used  PA cath was placed.Swan type:thermodilution PA Cath depth:48 Procedure performed without using ultrasound guided technique. Attempts: 1 Patient tolerated the procedure well with no immediate complications.

## 2018-01-27 ENCOUNTER — Inpatient Hospital Stay (HOSPITAL_COMMUNITY): Payer: 59

## 2018-01-27 ENCOUNTER — Encounter (HOSPITAL_COMMUNITY): Payer: Self-pay | Admitting: Thoracic Surgery (Cardiothoracic Vascular Surgery)

## 2018-01-27 LAB — PREPARE FRESH FROZEN PLASMA
UNIT DIVISION: 0
UNIT DIVISION: 0
Unit division: 0
Unit division: 0

## 2018-01-27 LAB — GLUCOSE, CAPILLARY
GLUCOSE-CAPILLARY: 110 mg/dL — AB (ref 70–99)
Glucose-Capillary: 105 mg/dL — ABNORMAL HIGH (ref 70–99)
Glucose-Capillary: 81 mg/dL (ref 70–99)
Glucose-Capillary: 84 mg/dL (ref 70–99)
Glucose-Capillary: 94 mg/dL (ref 70–99)
Glucose-Capillary: 108 mg/dL — ABNORMAL HIGH (ref 70–99)

## 2018-01-27 LAB — CBC
HCT: 28.3 % — ABNORMAL LOW (ref 39.0–52.0)
HEMATOCRIT: 27.4 % — AB (ref 39.0–52.0)
HEMOGLOBIN: 8.6 g/dL — AB (ref 13.0–17.0)
Hemoglobin: 8.7 g/dL — ABNORMAL LOW (ref 13.0–17.0)
MCH: 27 pg (ref 26.0–34.0)
MCH: 26.6 pg (ref 26.0–34.0)
MCHC: 31.4 g/dL (ref 30.0–36.0)
MCHC: 30.7 g/dL (ref 30.0–36.0)
MCV: 86.2 fL (ref 80.0–100.0)
MCV: 86.5 fL (ref 80.0–100.0)
NRBC: 0 % (ref 0.0–0.2)
NRBC: 0 % (ref 0.0–0.2)
PLATELETS: 202 10*3/uL (ref 150–400)
Platelets: 147 10*3/uL — ABNORMAL LOW (ref 150–400)
RBC: 3.18 MIL/uL — AB (ref 4.22–5.81)
RBC: 3.27 MIL/uL — ABNORMAL LOW (ref 4.22–5.81)
RDW: 13.6 % (ref 11.5–15.5)
RDW: 13.9 % (ref 11.5–15.5)
WBC: 18.6 10*3/uL — AB (ref 4.0–10.5)
WBC: 18.8 10*3/uL — AB (ref 4.0–10.5)

## 2018-01-27 LAB — BPAM FFP
Blood Product Expiration Date: 201911122359
Blood Product Expiration Date: 201911122359
Blood Product Expiration Date: 201911122359
Blood Product Expiration Date: 201911122359
ISSUE DATE / TIME: 201911071401
ISSUE DATE / TIME: 201911071401
ISSUE DATE / TIME: 201911071401
ISSUE DATE / TIME: 201911071401
UNIT TYPE AND RH: 600
Unit Type and Rh: 6200
Unit Type and Rh: 6200
Unit Type and Rh: 6200

## 2018-01-27 LAB — POCT I-STAT, CHEM 8
BUN: 13 mg/dL (ref 6–20)
CREATININE: 0.8 mg/dL (ref 0.61–1.24)
Calcium, Ion: 1.32 mmol/L (ref 1.15–1.40)
Chloride: 104 mmol/L (ref 98–111)
GLUCOSE: 129 mg/dL — AB (ref 70–99)
HCT: 25 % — ABNORMAL LOW (ref 39.0–52.0)
HEMOGLOBIN: 8.5 g/dL — AB (ref 13.0–17.0)
Potassium: 4.4 mmol/L (ref 3.5–5.1)
Sodium: 138 mmol/L (ref 135–145)
TCO2: 25 mmol/L (ref 22–32)

## 2018-01-27 LAB — PREPARE PLATELET PHERESIS: Unit division: 0

## 2018-01-27 LAB — BPAM PLATELET PHERESIS
BLOOD PRODUCT EXPIRATION DATE: 201911082359
ISSUE DATE / TIME: 201911071330
Unit Type and Rh: 6200

## 2018-01-27 LAB — BASIC METABOLIC PANEL
ANION GAP: 8 (ref 5–15)
BUN: 14 mg/dL (ref 6–20)
CHLORIDE: 111 mmol/L (ref 98–111)
CO2: 20 mmol/L — AB (ref 22–32)
Calcium: 8.4 mg/dL — ABNORMAL LOW (ref 8.9–10.3)
Creatinine, Ser: 0.95 mg/dL (ref 0.61–1.24)
GFR calc non Af Amer: >60 mL/min (ref >60)
GLUCOSE: 107 mg/dL — AB (ref 70–99)
POTASSIUM: 4.5 mmol/L (ref 3.5–5.1)
Sodium: 139 mmol/L (ref 135–145)

## 2018-01-27 LAB — CREATININE, SERUM: Creatinine, Ser: 0.96 mg/dL (ref 0.61–1.24)

## 2018-01-27 LAB — MAGNESIUM
MAGNESIUM: 2.5 mg/dL — AB (ref 1.7–2.4)
Magnesium: 3 mg/dL — ABNORMAL HIGH (ref 1.7–2.4)

## 2018-01-27 MED ORDER — RIFAMPIN 300 MG PO CAPS
300.0000 mg | ORAL_CAPSULE | Freq: Three times a day (TID) | ORAL | Status: DC
  Administered 2018-01-27 – 2018-02-01 (×15): 300 mg via ORAL
  Filled 2018-01-27 (×20): qty 1

## 2018-01-27 MED ORDER — FUROSEMIDE 10 MG/ML IJ SOLN
20.0000 mg | Freq: Two times a day (BID) | INTRAMUSCULAR | Status: AC
  Administered 2018-01-27 – 2018-01-28 (×3): 20 mg via INTRAVENOUS
  Filled 2018-01-27 (×3): qty 2

## 2018-01-27 MED ORDER — SODIUM CHLORIDE 0.9 % IV SOLN: 2.0000 g | INTRAVENOUS | Status: DC

## 2018-01-27 MED ORDER — INSULIN ASPART 100 UNIT/ML ~~LOC~~ SOLN: 0.0000 [IU] | SUBCUTANEOUS | Status: DC

## 2018-01-27 MED ORDER — FENTANYL CITRATE (PF) 100 MCG/2ML IJ SOLN
25.0000 ug | INTRAMUSCULAR | Status: DC | PRN
  Administered 2018-01-27 – 2018-01-29 (×3): 50 ug via INTRAVENOUS
  Filled 2018-01-27 (×3): qty 2

## 2018-01-27 MED ORDER — RIFAMPIN 300 MG PO CAPS
300.0000 mg | ORAL_CAPSULE | Freq: Three times a day (TID) | ORAL | Status: DC
  Filled 2018-01-27: qty 1

## 2018-01-27 MED ORDER — KETOROLAC TROMETHAMINE 15 MG/ML IJ SOLN
15.0000 mg | Freq: Four times a day (QID) | INTRAMUSCULAR | Status: AC
  Administered 2018-01-27 – 2018-01-28 (×5): 15 mg via INTRAVENOUS
  Filled 2018-01-27 (×5): qty 1

## 2018-01-27 MED ORDER — SODIUM CHLORIDE 0.9 % IV SOLN
2.0000 g | INTRAVENOUS | Status: DC
  Administered 2018-01-27 – 2018-01-31 (×24): 2 g via INTRAVENOUS
  Filled 2018-01-27 (×31): qty 2000

## 2018-01-27 MED FILL — Sodium Bicarbonate IV Soln 8.4%: INTRAVENOUS | Qty: 50 | Status: AC

## 2018-01-27 MED FILL — Heparin Sodium (Porcine) Inj 1000 Unit/ML: INTRAMUSCULAR | Qty: 60 | Status: AC

## 2018-01-27 MED FILL — Heparin Sodium (Porcine) Inj 1000 Unit/ML: INTRAMUSCULAR | Qty: 20 | Status: AC

## 2018-01-27 MED FILL — Magnesium Sulfate Inj 50%: INTRAMUSCULAR | Qty: 10 | Status: AC

## 2018-01-27 MED FILL — Electrolyte-R (PH 7.4) Solution: INTRAVENOUS | Qty: 1000 | Status: AC

## 2018-01-27 MED FILL — Electrolyte-R (PH 7.4) Solution: INTRAVENOUS | Qty: 9000 | Status: AC

## 2018-01-27 MED FILL — Heparin Sodium (Porcine) Inj 1000 Unit/ML: INTRAMUSCULAR | Qty: 30 | Status: AC

## 2018-01-27 MED FILL — Lidocaine HCl(Cardiac) IV PF Soln Pref Syr 100 MG/5ML (2%): INTRAVENOUS | Qty: 25 | Status: AC

## 2018-01-27 MED FILL — Sodium Chloride IV Soln 0.9%: INTRAVENOUS | Qty: 4000 | Status: AC

## 2018-01-27 MED FILL — Heparin Sodium (Porcine) Inj 1000 Unit/ML: INTRAMUSCULAR | Qty: 2500 | Status: AC

## 2018-01-27 MED FILL — Potassium Chloride Inj 2 mEq/ML: INTRAVENOUS | Qty: 20 | Status: AC

## 2018-01-27 MED FILL — Albumin, Human Inj 5%: INTRAVENOUS | Qty: 250 | Status: AC

## 2018-01-27 MED FILL — Mannitol IV Soln 20%: INTRAVENOUS | Qty: 1000 | Status: AC

## 2018-01-27 MED FILL — Potassium Chloride Inj 2 mEq/ML: INTRAVENOUS | Qty: 40 | Status: AC

## 2018-01-27 NOTE — Progress Notes (Signed)
      [address]       [place] [address]             [phone]      POD # 1 Aortic root replacement  BP 109/79   Pulse 80   Temp 98.8 F (37.1 C)   Resp (!) 24   Ht 5\' 11"  (1.803 m)   Wt 88.3 kg   SpO2 94%   BMI 27.15 kg/m   Intake/Output Summary (Last 24 hours) at 01/27/2018 1828 Last data filed at 01/27/2018 1800 Gross per 24 hour  Intake 3072.67 ml  Output 2299 ml  Net 773.67 ml   K= 4.4, creatinine 0.8 Hct=25  Doing well POD # 1  Lucious Zou C. Roxan Hockey, MD [facility] [phone]

## 2018-01-27 NOTE — Anesthesia Postprocedure Evaluation (Signed)
Anesthesia Post Note  Patient: Patrick Walter  Procedure(s) Performed: REDO STERNOTOMY, REDO AORTIC VALVE REPLACEMENT WITH HUMAN ALLOGRAFT INCLUDING AORTIC ROOT REPLACEMENT,  REPAIR ASCENDING THORACIC AORTIC ANEURYSM (N/A Chest) TRANSESOPHAGEAL ECHOCARDIOGRAM (TEE) (N/A Esophagus)     Patient location during evaluation: SICU Anesthesia Type: General Level of consciousness: awake and alert, oriented and patient cooperative Pain management: pain level controlled Vital Signs Assessment: post-procedure vital signs reviewed and stable Respiratory status: spontaneous breathing, nonlabored ventilation, respiratory function stable and patient connected to nasal cannula oxygen Cardiovascular status: blood pressure returned to baseline and stable Postop Assessment: no apparent nausea or vomiting, adequate PO intake and able to ambulate Anesthetic complications: no    Last Vitals:  Vitals:   01/27/18 1400 01/27/18 1500  BP: 119/76 127/82  Pulse: 80 81  Resp: (!) 25 (!) 24  Temp: 37.3 C 37.2 C  SpO2: 95% 96%    Last Pain:  Vitals:   01/27/18 1521  TempSrc:   PainSc: 6                  Cyd Hostler,E. Lakara Weiland

## 2018-01-27 NOTE — Progress Notes (Addendum)
[facility] for [facility]   Reason for visit: Follow up on PV endocarditis  Interval History: s/p bioprosthetic valve replacement; now extubated, alert; WBC 18.6.     Physical Exam: Constitutional:  Vitals:   01/27/18 0700 01/27/18 0800  BP:    Pulse: 90 93  Resp: (!) 28 (!) 21  Temp: 98.2 F (36.8 C) 98.2 F (36.8 C)  SpO2: 95% 95%   patient appears in NAD Eyes: anicteric Respiratory: Normal respiratory effort; CTA B Cardiovascular: RRR GI: soft, nt, nd Neuro: alert and oriented MS: non-focal  Review of Systems: Constitutional: negative for fevers and chills Integument/breast: negative for rash  Lab Results  Component Value Date   WBC 18.6 (H) 01/27/2018   HGB 8.6 (L) 01/27/2018   HCT 27.4 (L) 01/27/2018   MCV 86.2 01/27/2018   PLT 147 (L) 01/27/2018    Lab Results  Component Value Date   CREATININE 0.95 01/27/2018   BUN 14 01/27/2018   NA 139 01/27/2018   K 4.5 01/27/2018   CL 111 01/27/2018   CO2 20 (L) 01/27/2018    Lab Results  Component Value Date   ALT 15 01/25/2018   AST 18 01/25/2018   ALKPHOS 94 01/25/2018     Microbiology: Recent Results (from the past 240 hour(s))  Culture, blood (Routine X 2) w Reflex to ID Panel     Status: None   Collection Time: 01/17/18 12:57 PM  Result Value Ref Range Status   Specimen Description   Final    BLOOD LEFT ARM Performed at [facility], [place] [address] [place], [place] [address]    Special Requests   Final    BOTTLES DRAWN AEROBIC AND ANAEROBIC Blood Culture adequate volume   Culture   Final    NO GROWTH 5 DAYS Performed at [facility], [place] [address], [place], [place] [address]    Report Status 01/22/2018 FINAL  Final  MRSA PCR Screening     Status: None   Collection Time: 01/25/18  1:05 PM  Result Value Ref Range Status   MRSA by PCR NEGATIVE NEGATIVE Final    Comment:        The GeneXpert MRSA Assay (FDA approved for NASAL specimens only), is one  component of a comprehensive MRSA colonization surveillance program. It is not intended to diagnose MRSA infection nor to guide or monitor treatment for MRSA infections. Performed at [facility], [place] [address] [place], [place] [address]   Aerobic/Anaerobic Culture (surgical/deep wound)     Status: None (Preliminary result)   Collection Time: 01/26/18 10:53 AM  Result Value Ref Range Status   Specimen Description TISSUE AORTA VALVE VEGETATION  Final   Special Requests NONE  Final   Gram Stain   Final    RARE WBC PRESENT,BOTH PMN AND MONONUCLEAR RARE GRAM POSITIVE COCCI Performed at [facility], [place] [address], [place], [place] [address]    Culture PENDING  Incomplete   Report Status PENDING  Incomplete  Aerobic/Anaerobic Culture (surgical/deep wound)     Status: None (Preliminary result)   Collection Time: 01/26/18 10:58 AM  Result Value Ref Range Status   Specimen Description WOUND AORTA VALVE VEGETATION  Final   Special Requests SPEC B ON SWABS  Final   Gram Stain   Final    NO WBC SEEN NO ORGANISMS SEEN Performed at [facility], [address], [place], [place] [address]    Culture PENDING  Incomplete   Report Status PENDING  Incomplete    Impression/Plan:  1. PV endocarditis - MSSA.  On cefazolin, rifampin restarted.   Will need prolonged cefazolin + rifampin.  I will use nafcillin for now with ability to closely monitor labs since he has septic emboli for a little better CNS penetration.   No indication for gentamicin with valve replacement but I do think rifampin with cefazolin/nafcillin is ideal.    2.  Medication monitoring - will monitor LFTs on rifampin  3.  Septic emboli - noted on MRI.  No overt deficits.

## 2018-01-27 NOTE — Progress Notes (Addendum)
[facility] DAILY ICU PROGRESS NOTE                   Patrick Walter.[address]            [place], [address]          [phone]   1 Day Post-Op Procedure(s) (LRB): REDO STERNOTOMY, REDO AORTIC VALVE REPLACEMENT WITH HUMAN ALLOGRAFT INCLUDING AORTIC ROOT REPLACEMENT,  REPAIR ASCENDING THORACIC AORTIC ANEURYSM (N/A) TRANSESOPHAGEAL ECHOCARDIOGRAM (TEE) (N/A)  Total Length of Stay:  LOS: 12 days   Subjective: Pretty comfortable CHB under pacer currently  Objective: Vital signs in last 24 hours: Temp:  [97.5 F (36.4 C)-98.6 F (37 C)] 98.2 F (36.8 C) (11/08 0700) Pulse Rate:  [80-90] 90 (11/08 0700) Cardiac Rhythm: A-V Sequential paced (11/08 0400) Resp:  [11-30] 28 (11/08 0700) BP: (84-116)/(71-83) 116/73 (11/08 0624) SpO2:  [94 %-100 %] 95 % (11/08 0700) Arterial Line BP: (91-135)/(58-74) 134/60 (11/08 0700) FiO2 (%):  [40 %-50 %] 40 % (11/07 2005) Weight:  [88.3 kg] 88.3 kg (11/08 0500)  Filed Weights   01/25/18 0451 01/26/18 0550 01/27/18 0500  Weight: 79.3 kg 78.7 kg 88.3 kg    Weight change: 9.6 kg   Hemodynamic parameters for last 24 hours: PAP: (17-28)/(10-19) 24/12 CO:  [4 L/min-7.9 L/min] 7.9 L/min CI:  [2 L/min/m2-4 L/min/m2] 4 L/min/m2  Intake/Output from previous day: 11/07 0701 - 11/08 0700 In: 7553.9 [I.V.:4682; Blood:1771; IV Piggyback:1100.9] Out: 4140 [Urine:2520; Blood:1300; Chest Tube:320]  Intake/Output this shift: No intake/output data recorded.  Current Meds: Scheduled Meds: . acetaminophen  1,000 mg Oral Q6H   Or  . acetaminophen (TYLENOL) oral liquid 160 mg/5 mL  1,000 mg Per Tube Q6H  . aspirin EC  325 mg Oral Daily   Or  . aspirin  324 mg Per Tube Daily  . bisacodyl  10 mg Oral Daily   Or  . bisacodyl  10 mg Rectal Daily  . chlorhexidine gluconate (MEDLINE KIT)  15 mL Mouth Rinse BID  . Chlorhexidine Gluconate Cloth  6 each Topical Daily  . docusate sodium  200 mg Oral Daily  . insulin aspart  0-24 Units Subcutaneous Q4H  .  mouth rinse  15 mL Mouth Rinse BID  . [START ON 01/28/2018] pantoprazole  40 mg Oral Daily  . sodium chloride flush  10-40 mL Intracatheter Q12H  . sodium chloride flush  3 mL Intravenous Q12H   Continuous Infusions: . sodium chloride 20 mL/hr at 01/27/18 0600  . sodium chloride    . sodium chloride 20 mL/hr at 01/26/18 1738  . albumin human 12.5 g (01/26/18 2130)  . [START ON 01/28/2018]  ceFAZolin (ANCEF) IV    . cefUROXime (ZINACEF)  IV Stopped (01/26/18 2250)  . dexmedetomidine (PRECEDEX) IV infusion Stopped (01/27/18 0337)  . lactated ringers    . lactated ringers    . lactated ringers Stopped (01/27/18 0521)  . nitroGLYCERIN Stopped (01/26/18 1715)  . phenylephrine (NEO-SYNEPHRINE) Adult infusion Stopped (01/27/18 0102)  . vancomycin     PRN Meds:.sodium chloride, albumin human, lactated ringers, metoprolol tartrate, morphine injection, ondansetron (ZOFRAN) IV, oxyCODONE, sodium chloride flush, sodium chloride flush, traMADol  General appearance: alert, cooperative and no distress Heart: regular rate and rhythm and minor rub, no murmur Lungs: clear anterolat Abdomen: soft, nontender Extremities: no dig edema Wound: dressing intact  Lab Results: CBC: Recent Labs    01/26/18 2222 01/26/18 2228 01/27/18 0348  WBC 22.6*  --  18.6*  HGB 8.9* 7.8* 8.6*  HCT 27.4* 23.0* 27.4*  PLT 146*  --  147*   BMET:  Recent Labs    01/26/18 0333  01/26/18 2228 01/27/18 0348  NA 136   < > 138 139  K 4.1   < > 4.8 4.5  CL 103   < > 107 111  CO2 24  --   --  20*  GLUCOSE 122*   < > 104* 107*  BUN 18   < > 13 14  CREATININE 0.96   < > 0.70 0.95  CALCIUM 10.2  --   --  8.4*   < > = values in this interval not displayed.    CMET: Lab Results  Component Value Date   WBC 18.6 (H) 01/27/2018   HGB 8.6 (L) 01/27/2018   HCT 27.4 (L) 01/27/2018   PLT 147 (L) 01/27/2018   GLUCOSE 107 (H) 01/27/2018   CHOL 171 12/06/2017   TRIG 75 12/06/2017   HDL 48 12/06/2017   LDLCALC 107 (H)  12/06/2017   ALT 15 01/25/2018   AST 18 01/25/2018   NA 139 01/27/2018   K 4.5 01/27/2018   CL 111 01/27/2018   CREATININE 0.95 01/27/2018   BUN 14 01/27/2018   CO2 20 (L) 01/27/2018   TSH 2.94 12/06/2017   INR 1.65 01/26/2018   HGBA1C 5.5 01/25/2018      PT/INR:  Recent Labs    01/26/18 1725  LABPROT 19.3*  INR 1.65   Radiology: Dg Chest Port 1 View  Result Date: 01/26/2018 CLINICAL DATA:  Atelectasis EXAM: PORTABLE CHEST 1 VIEW COMPARISON:  May 21, 2017 FINDINGS: A PA catheter terminates near the pulmonary outflow tract. The ETT is in good position. No pneumothorax. Mild atelectasis in the left base. The cardiac and mediastinal silhouette cysts appear more prominent the interval, possibly due to portable technique. No other acute abnormalities. IMPRESSION: The cardiomediastinal silhouette appears more prominent the interval, possibly due to the portable technique. Support apparatus as above. Electronically Signed   By: Dorise Bullion III M.D   On: 01/26/2018 17:46     Assessment/Plan: S/P Procedure(s) (LRB): REDO STERNOTOMY, REDO AORTIC VALVE REPLACEMENT WITH HUMAN ALLOGRAFT INCLUDING AORTIC ROOT REPLACEMENT,  REPAIR ASCENDING THORACIC AORTIC ANEURYSM (N/A) TRANSESOPHAGEAL ECHOCARDIOGRAM (TEE) (N/A)  1 hemodynamics excellent on no inotropic support 2 cont pacer for CHB- monitor over time, hopefully will not require pacer placement 3 restart rifampin 4 leave CT's in place( mediastinal) 5 Good UOP, monitor, will probably require diuretic- normal BUN/Creat, GFR 6 leukocytosis is trending lower, H/H- ABL anemia is stable, minor thrombocytopenia- monitor 7 BS good control     Patrick Glade Gold PA-C 01/27/2018 8:10 AM  Pager [phone]  I have seen and examined the patient and agree with the assessment and plan as outlined.  Doing remarkably well POD1 although still in CHB.  Mobilize.  D/C lines.  D/C tubes later today or tomorrow, depending on output.  Stop Ancef and start  Nafcillin per ID recs.  Restart Rifampin.   [name], MD 01/27/2018

## 2018-01-28 ENCOUNTER — Inpatient Hospital Stay (HOSPITAL_COMMUNITY): Payer: 59

## 2018-01-28 LAB — GLUCOSE, CAPILLARY: Glucose-Capillary: 114 mg/dL — ABNORMAL HIGH (ref 70–99)

## 2018-01-28 LAB — BASIC METABOLIC PANEL
ANION GAP: 6 (ref 5–15)
BUN: 16 mg/dL (ref 6–20)
CHLORIDE: 108 mmol/L (ref 98–111)
CO2: 25 mmol/L (ref 22–32)
Calcium: 9.1 mg/dL (ref 8.9–10.3)
Creatinine, Ser: 1.17 mg/dL (ref 0.61–1.24)
GFR calc non Af Amer: >60 mL/min (ref >60)
Glucose, Bld: 110 mg/dL — ABNORMAL HIGH (ref 70–99)
Potassium: 4.3 mmol/L (ref 3.5–5.1)
Sodium: 139 mmol/L (ref 135–145)

## 2018-01-28 LAB — CBC
HEMATOCRIT: 27.1 % — AB (ref 39.0–52.0)
HEMOGLOBIN: 8.4 g/dL — AB (ref 13.0–17.0)
MCH: 27.4 pg (ref 26.0–34.0)
MCHC: 31 g/dL (ref 30.0–36.0)
MCV: 88.3 fL (ref 80.0–100.0)
NRBC: 0 % (ref 0.0–0.2)
Platelets: 193 10*3/uL (ref 150–400)
RBC: 3.07 MIL/uL — AB (ref 4.22–5.81)
RDW: 14.2 % (ref 11.5–15.5)
WBC: 15.1 10*3/uL — ABNORMAL HIGH (ref 4.0–10.5)

## 2018-01-28 NOTE — Plan of Care (Signed)
  Problem: Clinical Measurements: Goal: Ability to maintain clinical measurements within normal limits will improve Outcome: Progressing Goal: Diagnostic test results will improve Outcome: Progressing Goal: Respiratory complications will improve Outcome: Progressing Goal: Cardiovascular complication will be avoided Outcome: Progressing   Problem: Activity: Goal: Risk for activity intolerance will decrease Outcome: Progressing   Problem: Coping: Goal: Level of anxiety will decrease Outcome: Progressing   Problem: Elimination: Goal: Will not experience complications related to urinary retention Outcome: Progressing   Problem: Pain Managment: Goal: General experience of comfort will improve Outcome: Progressing   Problem: Safety: Goal: Ability to remain free from injury will improve Outcome: Progressing

## 2018-01-28 NOTE — Progress Notes (Addendum)
      [address]       [place] [address]             [phone]      POD # 2  Watching football, no complaints  BP 107/70   Pulse 69   Temp 99.1 F (37.3 C)   Resp (!) 27   Ht 5\' 11"  (1.803 m)   Wt 82.7 kg   SpO2 96%   BMI 25.43 kg/m   Intake/Output Summary (Last 24 hours) at 01/28/2018 1814 Last data filed at 01/28/2018 1700 Gross per 24 hour  Intake 1606.92 ml  Output 3270 ml  Net -1663.08 ml  CBG OK  Remo Lipps C. Roxan Hockey, MD [facility] [phone]

## 2018-01-28 NOTE — Progress Notes (Signed)
2 Days Post-Op Procedure(s) (LRB): REDO STERNOTOMY, REDO AORTIC VALVE REPLACEMENT WITH HUMAN ALLOGRAFT INCLUDING AORTIC ROOT REPLACEMENT,  REPAIR ASCENDING THORACIC AORTIC ANEURYSM (N/A) TRANSESOPHAGEAL ECHOCARDIOGRAM (TEE) (N/A) Subjective: Some incisional pain, but overall feels well Mild nausea, denies abdominal pain  Objective: Vital signs in last 24 hours: Temp:  [97.5 F (36.4 C)-99.3 F (37.4 C)] 99 F (37.2 C) (11/09 0900) Pulse Rate:  [79-90] 79 (11/09 0900) Cardiac Rhythm: A-V Sequential paced (11/09 0800) Resp:  [14-25] 24 (11/09 0900) BP: (98-129)/(71-106) 126/106 (11/09 0900) SpO2:  [94 %-99 %] 99 % (11/09 0900) Weight:  [82.7 kg] 82.7 kg (11/09 0500)  Hemodynamic parameters for last 24 hours:    Intake/Output from previous day: 11/08 0701 - 11/09 0700 In: 1959.7 [P.O.:600; I.V.:459.9; IV Piggyback:899.8] Out: 2575 [Urine:2275; Chest Tube:300] Intake/Output this shift: Total I/O In: 229.2 [I.V.:29.1; IV Piggyback:200.1] Out: 530 [Urine:500; Chest Tube:30]  General appearance: alert, cooperative and no distress Neurologic: intact Heart: regular rate and rhythm and paced Lungs: diminished breath sounds bibasilar Abdomen: soft, nontender, + BS  Lab Results: Recent Labs    01/27/18 1617 01/27/18 1623 01/28/18 0741  WBC 18.8*  --  15.1*  HGB 8.7* 8.5* 8.4*  HCT 28.3* 25.0* 27.1*  PLT 202  --  193   BMET:  Recent Labs    01/27/18 0348  01/27/18 1623 01/28/18 0434  NA 139  --  138 139  K 4.5  --  4.4 4.3  CL 111  --  104 108  CO2 20*  --   --  25  GLUCOSE 107*  --  129* 110*  BUN 14  --  13 16  CREATININE 0.95   < > 0.80 1.17  CALCIUM 8.4*  --   --  9.1   < > = values in this interval not displayed.    PT/INR:  Recent Labs    01/26/18 1725  LABPROT 19.3*  INR 1.65   ABG    Component Value Date/Time   PHART 7.386 01/26/2018 2228   HCO3 22.5 01/26/2018 2228   TCO2 25 01/27/2018 1623   ACIDBASEDEF 2.0 01/26/2018 2228   O2SAT 97.0  01/26/2018 2228   CBG (last 3)  Recent Labs    01/27/18 0037 01/27/18 0421 01/27/18 0839  GLUCAP 105* 110* 108*    Assessment/Plan: S/P Procedure(s) (LRB): REDO STERNOTOMY, REDO AORTIC VALVE REPLACEMENT WITH HUMAN ALLOGRAFT INCLUDING AORTIC ROOT REPLACEMENT,  REPAIR ASCENDING THORACIC AORTIC ANEURYSM (N/A) TRANSESOPHAGEAL ECHOCARDIOGRAM (TEE) (N/A) -CV- in CHB with slow escape- 40s, continue DDD pacing  RESP- continue IS  RENAL- creatinine and lytes OK  Continue IV diuresis  ENDO- CBG well controlled  Gi- CXR reviewed, abdominal exam completely benign and no complaints     LOS: 13 days    Patrick Walter 01/28/2018

## 2018-01-29 ENCOUNTER — Inpatient Hospital Stay (HOSPITAL_COMMUNITY): Payer: 59

## 2018-01-29 LAB — TYPE AND SCREEN
ABO/RH(D): A NEG
Antibody Screen: NEGATIVE
UNIT DIVISION: 0
UNIT DIVISION: 0
UNIT DIVISION: 0
UNIT DIVISION: 0
Unit division: 0
Unit division: 0

## 2018-01-29 LAB — BPAM RBC
BLOOD PRODUCT EXPIRATION DATE: 201912012359
BLOOD PRODUCT EXPIRATION DATE: 201912022359
Blood Product Expiration Date: 201912022359
Blood Product Expiration Date: 201912022359
Blood Product Expiration Date: 201912052359
Blood Product Expiration Date: 201912062359
ISSUE DATE / TIME: 201911070718
ISSUE DATE / TIME: 201911070718
ISSUE DATE / TIME: 201911070718
ISSUE DATE / TIME: 201911070718
ISSUE DATE / TIME: 201911071335
ISSUE DATE / TIME: 201911071335
UNIT TYPE AND RH: 600
UNIT TYPE AND RH: 600
UNIT TYPE AND RH: 600
UNIT TYPE AND RH: 600
Unit Type and Rh: 600
Unit Type and Rh: 600

## 2018-01-29 LAB — CBC
HEMATOCRIT: 27.3 % — AB (ref 39.0–52.0)
Hemoglobin: 8.4 g/dL — ABNORMAL LOW (ref 13.0–17.0)
MCH: 27.4 pg (ref 26.0–34.0)
MCHC: 30.8 g/dL (ref 30.0–36.0)
MCV: 88.9 fL (ref 80.0–100.0)
PLATELETS: 180 10*3/uL (ref 150–400)
RBC: 3.07 MIL/uL — ABNORMAL LOW (ref 4.22–5.81)
RDW: 14.5 % (ref 11.5–15.5)
WBC: 13.8 10*3/uL — ABNORMAL HIGH (ref 4.0–10.5)
nRBC: 0 % (ref 0.0–0.2)

## 2018-01-29 LAB — COMPREHENSIVE METABOLIC PANEL
ALT: 12 U/L (ref 0–44)
AST: 18 U/L (ref 15–41)
Albumin: 2.1 g/dL — ABNORMAL LOW (ref 3.5–5.0)
Alkaline Phosphatase: 51 U/L (ref 38–126)
Anion gap: 7 (ref 5–15)
BILIRUBIN TOTAL: 1.6 mg/dL — AB (ref 0.3–1.2)
BUN: 17 mg/dL (ref 6–20)
CHLORIDE: 107 mmol/L (ref 98–111)
CO2: 26 mmol/L (ref 22–32)
Calcium: 9.2 mg/dL (ref 8.9–10.3)
Creatinine, Ser: 1.5 mg/dL — ABNORMAL HIGH (ref 0.61–1.24)
GFR, EST AFRICAN AMERICAN: 57 mL/min — AB (ref >60)
GFR, EST NON AFRICAN AMERICAN: 49 mL/min — AB (ref >60)
Glucose, Bld: 94 mg/dL (ref 70–99)
Potassium: 3.5 mmol/L (ref 3.5–5.1)
Sodium: 140 mmol/L (ref 135–145)
TOTAL PROTEIN: 4.9 g/dL — AB (ref 6.5–8.1)

## 2018-01-29 LAB — BASIC METABOLIC PANEL
Anion gap: 7 (ref 5–15)
BUN: 19 mg/dL (ref 6–20)
CALCIUM: 8.9 mg/dL (ref 8.9–10.3)
CHLORIDE: 105 mmol/L (ref 98–111)
CO2: 26 mmol/L (ref 22–32)
Creatinine, Ser: 1.44 mg/dL — ABNORMAL HIGH (ref 0.61–1.24)
GFR, EST AFRICAN AMERICAN: 60 mL/min — AB (ref >60)
GFR, EST NON AFRICAN AMERICAN: 51 mL/min — AB (ref >60)
Glucose, Bld: 99 mg/dL (ref 70–99)
Potassium: 3.2 mmol/L — ABNORMAL LOW (ref 3.5–5.1)
SODIUM: 138 mmol/L (ref 135–145)

## 2018-01-29 MED ORDER — POTASSIUM CHLORIDE CRYS ER 20 MEQ PO TBCR
20.0000 meq | EXTENDED_RELEASE_TABLET | ORAL | Status: DC | PRN
  Administered 2018-01-29 – 2018-01-30 (×2): 20 meq via ORAL
  Filled 2018-01-29 (×2): qty 1

## 2018-01-29 NOTE — Plan of Care (Signed)
  Problem: Clinical Measurements: Goal: Respiratory complications will improve Outcome: Progressing Goal: Cardiovascular complication will be avoided Outcome: Progressing   Problem: Activity: Goal: Risk for activity intolerance will decrease Outcome: Progressing   Problem: Coping: Goal: Level of anxiety will decrease Outcome: Progressing   Problem: Elimination: Goal: Will not experience complications related to bowel motility Outcome: Progressing Goal: Will not experience complications related to urinary retention Outcome: Progressing   Problem: Pain Managment: Goal: General experience of comfort will improve Outcome: Progressing   Problem: Safety: Goal: Ability to remain free from injury will improve Outcome: Progressing   Problem: Cardiac: Goal: Will achieve and/or maintain hemodynamic stability Outcome: Progressing   Problem: Clinical Measurements: Goal: Postoperative complications will be avoided or minimized Outcome: Progressing

## 2018-01-29 NOTE — Progress Notes (Signed)
Lavella Lemons, RN aware of order to discontinue central line and will do this after fluids finished.

## 2018-01-29 NOTE — Plan of Care (Signed)
  Problem: Clinical Measurements: Goal: Respiratory complications will improve Outcome: Progressing Goal: Cardiovascular complication will be avoided Outcome: Progressing   Problem: Activity: Goal: Risk for activity intolerance will decrease Outcome: Progressing   Problem: Coping: Goal: Level of anxiety will decrease Outcome: Progressing   Problem: Elimination: Goal: Will not experience complications related to bowel motility Outcome: Progressing Goal: Will not experience complications related to urinary retention Outcome: Progressing   Problem: Pain Managment: Goal: General experience of comfort will improve Outcome: Progressing   Problem: Safety: Goal: Ability to remain free from injury will improve Outcome: Progressing   Problem: Activity: Goal: Risk for activity intolerance will decrease Outcome: Progressing

## 2018-01-29 NOTE — Progress Notes (Signed)
      [address]       [place] [address]             [phone]      Sleeping BP 99/64   Pulse 65   Temp 98.2 F (36.8 C) (Oral)   Resp (!) 22   Ht 5\' 11"  (1.803 m)   Wt 79.2 kg   SpO2 98%   BMI 24.35 kg/m   Intake/Output Summary (Last 24 hours) at 01/29/2018 1827 Last data filed at 01/29/2018 1721 Gross per 24 hour  Intake 2327.72 ml  Output 1895 ml  Net 432.72 ml   Paced at 60 Repeat BMET in AM ECG in AM  [place] C. Roxan Hockey, MD [facility] [phone]

## 2018-01-29 NOTE — Progress Notes (Signed)
3 Days Post-Op Procedure(s) (LRB): REDO STERNOTOMY, REDO AORTIC VALVE REPLACEMENT WITH HUMAN ALLOGRAFT INCLUDING AORTIC ROOT REPLACEMENT,  REPAIR ASCENDING THORACIC AORTIC ANEURYSM (N/A) TRANSESOPHAGEAL ECHOCARDIOGRAM (TEE) (N/A) Subjective: No complaints this AM  Objective: Vital signs in last 24 hours: Temp:  [97.8 F (36.6 C)-98.6 F (37 C)] 97.9 F (36.6 C) (11/10 0803) Pulse Rate:  [46-99] 67 (11/10 0800) Cardiac Rhythm: A-V Sequential paced (11/10 0800) Resp:  [11-33] 17 (11/10 0800) BP: (102-135)/(60-93) 128/89 (11/10 0600) SpO2:  [91 %-98 %] 96 % (11/10 0800) Weight:  [79.2 kg] 79.2 kg (11/10 0500)  Hemodynamic parameters for last 24 hours:    Intake/Output from previous day: 11/09 0701 - 11/10 0700 In: 2191.1 [P.O.:240; I.V.:410.2; IV Piggyback:1540.8] Out: 2715 [Urine:2675; Chest Tube:40] Intake/Output this shift: Total I/O In: 149.9 [I.V.:49.9; IV Piggyback:100] Out: -   General appearance: alert, cooperative and no distress Neurologic: intact Heart: regular rate and rhythm Lungs: clear to auscultation bilaterally Abdomen: normal findings: soft, non-tender  Lab Results: Recent Labs    01/28/18 0741 01/29/18 0450  WBC 15.1* 13.8*  HGB 8.4* 8.4*  HCT 27.1* 27.3*  PLT 193 180   BMET:  Recent Labs    01/28/18 0434 01/29/18 0450  NA 139 140  K 4.3 3.5  CL 108 107  CO2 25 26  GLUCOSE 110* 94  BUN 16 17  CREATININE 1.17 1.50*  CALCIUM 9.1 9.2    PT/INR:  Recent Labs    01/26/18 1725  LABPROT 19.3*  INR 1.65   ABG    Component Value Date/Time   PHART 7.386 01/26/2018 2228   HCO3 22.5 01/26/2018 2228   TCO2 25 01/27/2018 1623   ACIDBASEDEF 2.0 01/26/2018 2228   O2SAT 97.0 01/26/2018 2228   CBG (last 3)  Recent Labs    01/27/18 0421 01/27/18 0839 01/28/18 1233  GLUCAP 110* 108* 114*    Assessment/Plan: S/P Procedure(s) (LRB): REDO STERNOTOMY, REDO AORTIC VALVE REPLACEMENT WITH HUMAN ALLOGRAFT INCLUDING AORTIC ROOT REPLACEMENT,   REPAIR ASCENDING THORACIC AORTIC ANEURYSM (N/A) TRANSESOPHAGEAL ECHOCARDIOGRAM (TEE) (N/A) -CV- paced currently, looks like sinus brady in 40s with long PR underneath, follow  RESP- continue IS  RENAL- creatinine up to 1.5. UOP Ok  Did receive toradol  Follow  ENDO- CBG well controlled  Anemia secondary to ABL- mild, follow  Cardiac rehab  LOS: 14 days    Melrose Nakayama 01/29/2018

## 2018-01-30 ENCOUNTER — Inpatient Hospital Stay (HOSPITAL_COMMUNITY): Payer: 59

## 2018-01-30 ENCOUNTER — Inpatient Hospital Stay: Payer: Self-pay

## 2018-01-30 DIAGNOSIS — Z954 Presence of other heart-valve replacement: Secondary | ICD-10-CM

## 2018-01-30 DIAGNOSIS — I442 Atrioventricular block, complete: Secondary | ICD-10-CM

## 2018-01-30 LAB — CBC
HEMATOCRIT: 27.1 % — AB (ref 39.0–52.0)
Hemoglobin: 8.2 g/dL — ABNORMAL LOW (ref 13.0–17.0)
MCH: 26.7 pg (ref 26.0–34.0)
MCHC: 30.3 g/dL (ref 30.0–36.0)
MCV: 88.3 fL (ref 80.0–100.0)
NRBC: 0 % (ref 0.0–0.2)
Platelets: 347 10*3/uL (ref 150–400)
RBC: 3.07 MIL/uL — ABNORMAL LOW (ref 4.22–5.81)
RDW: 14.6 % (ref 11.5–15.5)
WBC: 11.6 10*3/uL — AB (ref 4.0–10.5)

## 2018-01-30 LAB — COMPREHENSIVE METABOLIC PANEL
ALT: 13 U/L (ref 0–44)
AST: 21 U/L (ref 15–41)
Albumin: 2 g/dL — ABNORMAL LOW (ref 3.5–5.0)
Alkaline Phosphatase: 64 U/L (ref 38–126)
Anion gap: 4 — ABNORMAL LOW (ref 5–15)
BUN: 15 mg/dL (ref 6–20)
CHLORIDE: 107 mmol/L (ref 98–111)
CO2: 29 mmol/L (ref 22–32)
Calcium: 9.1 mg/dL (ref 8.9–10.3)
Creatinine, Ser: 1.42 mg/dL — ABNORMAL HIGH (ref 0.61–1.24)
GFR, EST NON AFRICAN AMERICAN: 52 mL/min — AB (ref >60)
Glucose, Bld: 102 mg/dL — ABNORMAL HIGH (ref 70–99)
POTASSIUM: 3.4 mmol/L — AB (ref 3.5–5.1)
SODIUM: 140 mmol/L (ref 135–145)
Total Bilirubin: 1.7 mg/dL — ABNORMAL HIGH (ref 0.3–1.2)
Total Protein: 4.6 g/dL — ABNORMAL LOW (ref 6.5–8.1)

## 2018-01-30 MED ORDER — SODIUM CHLORIDE 0.9% FLUSH
10.0000 mL | Freq: Two times a day (BID) | INTRAVENOUS | Status: DC
  Administered 2018-01-30 – 2018-01-31 (×2): 20 mL
  Administered 2018-02-01: 10 mL

## 2018-01-30 MED ORDER — SODIUM CHLORIDE 0.9% FLUSH: 10.0000 mL | INTRAVENOUS | Status: DC | PRN

## 2018-01-30 MED ORDER — MOVING RIGHT ALONG BOOK
Freq: Once | Status: AC
  Administered 2018-01-30: 10:00:00
  Filled 2018-01-30: qty 1

## 2018-01-30 MED ORDER — POTASSIUM CHLORIDE CRYS ER 20 MEQ PO TBCR
40.0000 meq | EXTENDED_RELEASE_TABLET | ORAL | Status: AC
  Administered 2018-01-30 (×3): 40 meq via ORAL
  Filled 2018-01-30 (×3): qty 2

## 2018-01-30 MED ORDER — FE FUMARATE-B12-VIT C-FA-IFC PO CAPS
1.0000 | ORAL_CAPSULE | Freq: Three times a day (TID) | ORAL | Status: DC
  Administered 2018-01-30 – 2018-02-01 (×8): 1 via ORAL
  Filled 2018-01-30 (×9): qty 1

## 2018-01-30 NOTE — Progress Notes (Signed)
EKG CRITICAL VALUE     12 lead EKG performed.  Critical value noted.  Apolonio Schneiders, RN notified.   Genia Plants, CCT 01/30/2018 9:09 AM

## 2018-01-30 NOTE — Progress Notes (Signed)
Patient arrived the unit on a wheelchair from [place], placed on tele ccmd notified, HR rates in the 30's Roxy Manns MD aware per report from [place], patient oriented to room and staff, bed in lowest position, call bell within reach will monitor.

## 2018-01-30 NOTE — Progress Notes (Signed)
CARDIAC REHAB PHASE I   Offered to walk with pt, pt declining at this time. Pt states he doesn't "feel the best", and is afraid to sleep because he doesn't want his heart rate to go any lower. Encouraged pt to get some rest, that he is wearing a heart monitor for a reason. Will continue to follow.  Rufina Falco, RN BSN 01/30/2018 1:52 PM

## 2018-01-30 NOTE — Progress Notes (Signed)
Call rapid Response R.N. Mindy and ask her to come and assess patient and Epicardial pacer . Due to H.R. Dropping in the upper 20's and Lower 30's occ. While pacer set at VVI rate of 50

## 2018-01-30 NOTE — Progress Notes (Signed)
Dr. Emilio Aspen into see patient  And increase M.A. From 1 to 4 on pacer.

## 2018-01-30 NOTE — Progress Notes (Signed)
Patient's heart rate is 33, BP 96/55 having frequent  Ventricular stand still on the monitor, patient stated that he had some nausea,denied other symptom associated with bradycardia ,Lynden Oxford MD paged, verbal order received to initate external pacing with settings at VVI @50 , patient placed on external pace maker as ordered by this RN and Bedelia Person, patient Vpacing at 50, no sign of distress noted, verbal order received from [place] MD to consult EP service if needed , report given to oncoming nurse.

## 2018-01-30 NOTE — Progress Notes (Signed)
Dr. Neena Rhymes. Look at C.X.R. and it is O.K. To use P.I.C.C. Line now.

## 2018-01-30 NOTE — Progress Notes (Signed)
[facility] for Infectious Disease  Date of Admission:  01/15/2018     Total days of antibiotics 16         ASSESSMENT/PLAN  [name] is POD 4 from redo aortic valve replacement with human allograft including aortic root replacement following bacteremia with Staphylococcus Epidermidis in 4/4 bottle and found to be pan-sensitive. Repeat cultures were negative with surgical cultures without growth to date.  Current antimicrobial regimen of nafcillin and rifampin with total therapy days of 16. He has remained stable and afebrile with improving leukocytosis. Tolerating antibiotics with no adverse side effects.   1. Continue nafcillin and rifampin with prolonged therapy of at least 6 weeks.  2. Will need PICC line.    Principal Problem:   S/P redo aortic valve replacement with human allograft aortic root graft Active Problems:   Essential hypertension, benign   S/P aortic valve replacement with bioprosthetic valve   Primary osteoarthritis of left hip   Hypercalcemia   Normocytic anemia   Staphylococcus epidermidis bacteremia   Prosthetic valve endocarditis (HCC)   Septic embolism (HCC)   Pilonidal cyst   . acetaminophen  1,000 mg Oral Q6H  . aspirin EC  325 mg Oral Daily  . bisacodyl  10 mg Oral Daily  . Chlorhexidine Gluconate Cloth  6 each Topical Daily  . docusate sodium  200 mg Oral Daily  . ferrous [id number] C-folic acid  1 capsule Oral TID PC  . pantoprazole  40 mg Oral Daily  . potassium chloride  40 mEq Oral Q4H  . rifampin  300 mg Oral TID WC  . sodium chloride flush  10-40 mL Intracatheter Q12H  . sodium chloride flush  3 mL Intravenous Q12H    SUBJECTIVE:  Afebrile overnight with continued improving WBC count of 11.6. Kidney function remains stable and continues to improve slowly.   He is doing well today. Denies fevers, chills, or sweats.   No Known Allergies   Review of Systems: Review of Systems  Constitutional: Negative for  chills, diaphoresis and fever.  Respiratory: Negative for cough, shortness of breath and wheezing.   Cardiovascular: Negative for chest pain and leg swelling.  Gastrointestinal: Negative for abdominal pain, constipation, nausea and vomiting.      OBJECTIVE: Vitals:   01/30/18 0600 01/30/18 0844 01/30/18 0845 01/30/18 0900  BP: 133/90  114/73 111/74  Pulse: 65     Resp: 17  (!) 22 17  Temp:  98.1 F (36.7 C)    TempSrc:  Oral    SpO2: 96%     Weight:      Height:       Body mass index is 24.35 kg/m.  Physical Exam  Constitutional: He is oriented to person, place, and time. He appears well-developed and well-nourished. No distress.  Pleasant; seated in the chair.  Cardiovascular: Normal rate, regular rhythm, normal heart sounds and intact distal pulses. Exam reveals no friction rub.  No murmur heard. Chest incision site is well approximated and without evidence of infection.   Pulmonary/Chest: Effort normal and breath sounds normal. No stridor. No respiratory distress. He has no wheezes. He has no rales.  Neurological: He is alert and oriented to person, place, and time.  Skin: Skin is warm and dry.  Psychiatric: He has a normal mood and affect.    Lab Results Lab Results  Component Value Date   WBC 11.6 (H) 01/30/2018   HGB 8.2 (L) 01/30/2018   HCT 27.1 (L) 01/30/2018   MCV  88.3 01/30/2018   PLT 347 01/30/2018    Lab Results  Component Value Date   CREATININE 1.42 (H) 01/30/2018   BUN 15 01/30/2018   NA 140 01/30/2018   K 3.4 (L) 01/30/2018   CL 107 01/30/2018   CO2 29 01/30/2018    Lab Results  Component Value Date   ALT 13 01/30/2018   AST 21 01/30/2018   ALKPHOS 64 01/30/2018   BILITOT 1.7 (H) 01/30/2018     Microbiology: Recent Results (from the past 240 hour(s))  MRSA PCR Screening     Status: None   Collection Time: 01/25/18  1:05 PM  Result Value Ref Range Status   MRSA by PCR NEGATIVE NEGATIVE Final    Comment:        The GeneXpert MRSA  Assay (FDA approved for NASAL specimens only), is one component of a comprehensive MRSA colonization surveillance program. It is not intended to diagnose MRSA infection nor to guide or monitor treatment for MRSA infections. Performed at [facility], [place] [address] [place], [place] [address]   Aerobic/Anaerobic Culture (surgical/deep wound)     Status: None (Preliminary result)   Collection Time: 01/26/18 10:53 AM  Result Value Ref Range Status   Specimen Description TISSUE AORTA VALVE VEGETATION  Final   Special Requests NONE  Final   Gram Stain   Final    RARE WBC PRESENT,BOTH PMN AND MONONUCLEAR RARE GRAM POSITIVE COCCI    Culture   Final    NO GROWTH 3 DAYS NO ANAEROBES ISOLATED; CULTURE IN PROGRESS FOR 5 DAYS Performed at [facility], [place] [address], [place], [place] [address]    Report Status PENDING  Incomplete  Aerobic/Anaerobic Culture (surgical/deep wound)     Status: None (Preliminary result)   Collection Time: 01/26/18 10:58 AM  Result Value Ref Range Status   Specimen Description WOUND AORTA VALVE VEGETATION  Final   Special Requests SPEC B ON SWABS  Final   Gram Stain NO WBC SEEN NO ORGANISMS SEEN   Final   Culture   Final    NO GROWTH 3 DAYS NO ANAEROBES ISOLATED; CULTURE IN PROGRESS FOR 5 DAYS Performed at [facility], [place] [address], [place], [place] [address]    Report Status PENDING  Incomplete     Terri Piedra, [place] [facility] [phone] Pager  01/30/2018  9:41 AM

## 2018-01-30 NOTE — Progress Notes (Signed)
EKG CRITICAL VALUE     12 lead EKG performed.  Critical value noted.  Vanita Ingles RN, RN notified.   Carollee Sires, CCT 01/30/2018 7:32 AM

## 2018-01-30 NOTE — Progress Notes (Addendum)
Patrick Walter       [place] [address]             [phone]      4 Days Post-Op Procedure(s) (LRB): REDO STERNOTOMY, REDO AORTIC VALVE REPLACEMENT WITH HUMAN ALLOGRAFT INCLUDING AORTIC ROOT REPLACEMENT,  REPAIR ASCENDING THORACIC AORTIC ANEURYSM (N/A) TRANSESOPHAGEAL ECHOCARDIOGRAM (TEE) (N/A) Subjective: Looks and feels well Rhythm stable in 60's. Wandering pacemaker c/w CHB  Objective: Vital signs in last 24 hours: Temp:  [97.7 F (36.5 C)-98.9 F (37.2 C)] 98.9 F (37.2 C) (11/11 0400) Pulse Rate:  [58-89] 65 (11/11 0600) Cardiac Rhythm: Ventricular paced (11/11 0400) Resp:  [14-25] 17 (11/11 0600) BP: (95-140)/(63-98) 133/90 (11/11 0600) SpO2:  [96 %-100 %] 96 % (11/11 0600)  Hemodynamic parameters for last 24 hours:    Intake/Output from previous day: 11/10 0701 - 11/11 0700 In: 2124.8 [P.O.:575; I.V.:345.6; IV Piggyback:1204.1] Out: 1300 [Urine:1300] Intake/Output this shift: No intake/output data recorded.  General appearance: alert, cooperative and no distress Heart: regular rate and rhythm, no rub and no murmur Lungs: min din in bases Abdomen: benign Extremities: no edema Wound: incis healing well  Lab Results: Recent Labs    01/29/18 0450 01/30/18 0308  WBC 13.8* 11.6*  HGB 8.4* 8.2*  HCT 27.3* 27.1*  PLT 180 347   BMET:  Recent Labs    01/29/18 1730 01/30/18 0308  NA 138 140  K 3.2* 3.4*  CL 105 107  CO2 26 29  GLUCOSE 99 102*  BUN 19 15  CREATININE 1.44* 1.42*  CALCIUM 8.9 9.1    PT/INR: No results for input(s): LABPROT, INR in the last 72 hours. ABG    Component Value Date/Time   PHART 7.386 01/26/2018 2228   HCO3 22.5 01/26/2018 2228   TCO2 25 01/27/2018 1623   ACIDBASEDEF 2.0 01/26/2018 2228   O2SAT 97.0 01/26/2018 2228   CBG (last 3)  Recent Labs    01/27/18 0839 01/28/18 1233  GLUCAP 108* 114*    Meds Scheduled Meds: . acetaminophen  1,000 mg Oral Q6H  . aspirin EC  325 mg Oral Daily  .  bisacodyl  10 mg Oral Daily  . Chlorhexidine Gluconate Cloth  6 each Topical Daily  . docusate sodium  200 mg Oral Daily  . pantoprazole  40 mg Oral Daily  . rifampin  300 mg Oral TID WC  . sodium chloride flush  10-40 mL Intracatheter Q12H  . sodium chloride flush  3 mL Intravenous Q12H   Continuous Infusions: . sodium chloride    . lactated ringers    . lactated ringers 20 mL/hr at 01/30/18 0600  . nafcillin IV 2 g (01/30/18 0522)   PRN Meds:.fentaNYL (SUBLIMAZE) injection, lactated ringers, metoprolol tartrate, ondansetron (ZOFRAN) IV, oxyCODONE, potassium chloride, sodium chloride flush, sodium chloride flush, traMADol  Xrays Dg Chest Port 1 View  Result Date: 01/29/2018 CLINICAL DATA:  Follow-up surgery. EXAM: PORTABLE CHEST 1 VIEW COMPARISON:  Chest radiograph 01/28/2018 FINDINGS: Right IJ sheath stable in position. Monitoring leads overlie the patient. Stable cardiomegaly status post median sternotomy. Interval removal mediastinal drain. Heterogeneous opacities left lung base may represent atelectasis. Small left pleural effusion. IMPRESSION: 1. Interval removal mediastinal drain. 2. Small left pleural effusion and left basilar atelectasis. Electronically Signed   By: Lovey Newcomer M.D.   On: 01/29/2018 08:52   Dg Chest Port 1 View  Addendum Date: 01/28/2018   ADDENDUM REPORT: 01/28/2018 09:26 ADDENDUM: Critical Value/emergent results were called by telephone  at the time of interpretation on 01/28/2018 at 9:08 a.m. to Dr. Roxan Hockey, who verbally acknowledged these results. Electronically Signed   By: Marin Olp M.D.   On: 01/28/2018 09:26   Result Date: 01/28/2018 CLINICAL DATA:  Follow-up surgery. EXAM: PORTABLE CHEST 1 VIEW COMPARISON:  01/27/2018 FINDINGS: Sternotomy wires are unchanged. Mediastinal drain remains in place. Right IJ central venous sheath remains in place with tip over the SVC. Lungs are adequately inflated without consolidation, effusion or pneumothorax.  Cardiomediastinal silhouette and remainder of the chest is unchanged. There is crescentic air collection at the level of the right hemidiaphragm which may be due to free peritoneal air. IMPRESSION: No acute cardiopulmonary disease. Possible free subdiaphragmatic air. Tubes and lines as described. Electronically Signed: By: Marin Olp M.D. On: 01/28/2018 08:59   Results for orders placed or performed during the hospital encounter of 01/15/18  Culture, blood (Routine X 2) w Reflex to ID Panel     Status: Abnormal   Collection Time: 01/15/18  8:15 PM  Result Value Ref Range Status   Specimen Description   Final    BLOOD RIGHT HAND Performed at [facility], [address] [place], [place] [address]    Special Requests   Final    BOTTLES DRAWN AEROBIC AND ANAEROBIC Blood Culture adequate volume Performed at [facility], [address] [place], [place] [address]    Culture  Setup Time   Final    IN BOTH AEROBIC AND ANAEROBIC BOTTLES GRAM POSITIVE COCCI CRITICAL VALUE NOTED.  VALUE IS CONSISTENT WITH PREVIOUSLY REPORTED AND CALLED VALUE.    Culture (A)  Final    STAPHYLOCOCCUS EPIDERMIDIS SUSCEPTIBILITIES PERFORMED ON PREVIOUS CULTURE WITHIN THE LAST 5 DAYS. Performed at [facility], [place] [address], [place], [place] [address]    Report Status 01/19/2018 FINAL  Final  Culture, blood (Routine X 2) w Reflex to ID Panel     Status: Abnormal   Collection Time: 01/15/18  8:15 PM  Result Value Ref Range Status   Specimen Description   Final    BLOOD RIGHT ARM Performed at [facility], [address] [place], [place] [address]    Special Requests   Final    BOTTLES DRAWN AEROBIC AND ANAEROBIC Blood Culture adequate volume Performed at [facility], [address] [place], [place] [address]    Culture  Setup Time   Final    GRAM POSITIVE COCCI IN BOTH AEROBIC AND ANAEROBIC BOTTLES CRITICAL RESULT CALLED TO, READ BACK BY AND VERIFIED  WITH: Sheffield Slider [facility] 01/16/18 1937 JDW    Culture STAPHYLOCOCCUS EPIDERMIDIS (A)  Final   Report Status 01/18/2018 FINAL  Final   Organism ID, Bacteria STAPHYLOCOCCUS EPIDERMIDIS  Final      Susceptibility   Staphylococcus epidermidis - MIC*    CIPROFLOXACIN <=0.5 SENSITIVE Sensitive     ERYTHROMYCIN <=0.25 SENSITIVE Sensitive     GENTAMICIN <=0.5 SENSITIVE Sensitive     OXACILLIN <=0.25 SENSITIVE Sensitive     TETRACYCLINE <=1 SENSITIVE Sensitive     VANCOMYCIN 1 SENSITIVE Sensitive     TRIMETH/SULFA <=10 SENSITIVE Sensitive     CLINDAMYCIN <=0.25 SENSITIVE Sensitive     RIFAMPIN <=0.5 SENSITIVE Sensitive     Inducible Clindamycin NEGATIVE Sensitive     * STAPHYLOCOCCUS EPIDERMIDIS  Blood Culture ID Panel (Reflexed)     Status: Abnormal   Collection Time: 01/15/18  8:15 PM  Result Value Ref  Range Status   Enterococcus species NOT DETECTED NOT DETECTED Final   Listeria monocytogenes NOT DETECTED NOT DETECTED Final   Staphylococcus species DETECTED (A) NOT DETECTED Final    Comment: Methicillin (oxacillin) susceptible coagulase negative staphylococcus. Possible blood culture contaminant (unless isolated from more than one blood culture draw or clinical case suggests pathogenicity). No antibiotic treatment is indicated for blood  culture contaminants. CRITICAL RESULT CALLED TO, READ BACK BY AND VERIFIED WITH: Henry Russel [facility] 01/16/18 1937 JDW    Staphylococcus aureus (BCID) NOT DETECTED NOT DETECTED Final   Methicillin resistance NOT DETECTED NOT DETECTED Final   Streptococcus species NOT DETECTED NOT DETECTED Final   Streptococcus agalactiae NOT DETECTED NOT DETECTED Final   Streptococcus pneumoniae NOT DETECTED NOT DETECTED Final   Streptococcus pyogenes NOT DETECTED NOT DETECTED Final   Acinetobacter baumannii NOT DETECTED NOT DETECTED Final   Enterobacteriaceae species NOT DETECTED NOT DETECTED Final   Enterobacter cloacae complex NOT DETECTED NOT DETECTED Final    Escherichia coli NOT DETECTED NOT DETECTED Final   Klebsiella oxytoca NOT DETECTED NOT DETECTED Final   Klebsiella pneumoniae NOT DETECTED NOT DETECTED Final   Proteus species NOT DETECTED NOT DETECTED Final   Serratia marcescens NOT DETECTED NOT DETECTED Final   Haemophilus influenzae NOT DETECTED NOT DETECTED Final   Neisseria meningitidis NOT DETECTED NOT DETECTED Final   Pseudomonas aeruginosa NOT DETECTED NOT DETECTED Final   Candida albicans NOT DETECTED NOT DETECTED Final   Candida glabrata NOT DETECTED NOT DETECTED Final   Candida krusei NOT DETECTED NOT DETECTED Final   Candida parapsilosis NOT DETECTED NOT DETECTED Final   Candida tropicalis NOT DETECTED NOT DETECTED Final    Comment: Performed at [facility], [place]. [address] [place], [place] [address]  Group A Strep by PCR     Status: None   Collection Time: 01/15/18  8:19 PM  Result Value Ref Range Status   Group A Strep by PCR NOT DETECTED NOT DETECTED Final    Comment: Performed at [facility], [address] [place], [place] [address]  Respiratory Panel by PCR     Status: None   Collection Time: 01/16/18  7:52 AM  Result Value Ref Range Status   Adenovirus NOT DETECTED NOT DETECTED Final   Coronavirus 229E NOT DETECTED NOT DETECTED Final   Coronavirus HKU1 NOT DETECTED NOT DETECTED Final   Coronavirus NL63 NOT DETECTED NOT DETECTED Final   Coronavirus OC43 NOT DETECTED NOT DETECTED Final   Metapneumovirus NOT DETECTED NOT DETECTED Final   Rhinovirus / Enterovirus NOT DETECTED NOT DETECTED Final   Influenza A NOT DETECTED NOT DETECTED Final   Influenza B NOT DETECTED NOT DETECTED Final   Parainfluenza Virus 1 NOT DETECTED NOT DETECTED Final   Parainfluenza Virus 2 NOT DETECTED NOT DETECTED Final   Parainfluenza Virus 3 NOT DETECTED NOT DETECTED Final   Parainfluenza Virus 4 NOT DETECTED NOT DETECTED Final   Respiratory Syncytial Virus NOT DETECTED NOT DETECTED Final   Bordetella pertussis NOT  DETECTED NOT DETECTED Final   Chlamydophila pneumoniae NOT DETECTED NOT DETECTED Final   Mycoplasma pneumoniae NOT DETECTED NOT DETECTED Final  MRSA PCR Screening     Status: None   Collection Time: 01/16/18 11:08 AM  Result Value Ref Range Status   MRSA by PCR NEGATIVE NEGATIVE Final    Comment:        The GeneXpert MRSA Assay (FDA approved for NASAL specimens only), is one component of a comprehensive MRSA  colonization surveillance program. It is not intended to diagnose MRSA infection nor to guide or monitor treatment for MRSA infections. Performed at [facility], [place] [address], [place], [place] [address]   Culture, blood (Routine X 2) w Reflex to ID Panel     Status: None   Collection Time: 01/17/18 12:57 PM  Result Value Ref Range Status   Specimen Description   Final    BLOOD LEFT ARM Performed at [place] [address], [place], [place] [address]    Special Requests   Final    BOTTLES DRAWN AEROBIC AND ANAEROBIC Blood Culture adequate volume   Culture   Final    NO GROWTH 5 DAYS Performed at [facility], [place] [address], [place], [place] [address]    Report Status 01/22/2018 FINAL  Final  MRSA PCR Screening     Status: None   Collection Time: 01/25/18  1:05 PM  Result Value Ref Range Status   MRSA by PCR NEGATIVE NEGATIVE Final    Comment:        The GeneXpert MRSA Assay (FDA approved for NASAL specimens only), is one component of a comprehensive MRSA colonization surveillance program. It is not intended to diagnose MRSA infection nor to guide or monitor treatment for MRSA infections. Performed at [facility], [place] [address], [place], [place] [address]   Aerobic/Anaerobic Culture (surgical/deep wound)     Status: None (Preliminary result)   Collection Time: 01/26/18 10:53 AM  Result Value Ref Range Status   Specimen Description TISSUE AORTA VALVE VEGETATION  Final   Special Requests NONE  Final    Gram Stain   Final    RARE WBC PRESENT,BOTH PMN AND MONONUCLEAR RARE GRAM POSITIVE COCCI    Culture   Final    NO GROWTH 3 DAYS NO ANAEROBES ISOLATED; CULTURE IN PROGRESS FOR 5 DAYS Performed at [facility], [place] [address], [place], [place] [address]    Report Status PENDING  Incomplete  Aerobic/Anaerobic Culture (surgical/deep wound)     Status: None (Preliminary result)   Collection Time: 01/26/18 10:58 AM  Result Value Ref Range Status   Specimen Description WOUND AORTA VALVE VEGETATION  Final   Special Requests SPEC B ON SWABS  Final   Gram Stain NO WBC SEEN NO ORGANISMS SEEN   Final   Culture   Final    NO GROWTH 3 DAYS NO ANAEROBES ISOLATED; CULTURE IN PROGRESS FOR 5 DAYS Performed at [facility], [place] [address] [place], [place] [address]    Report Status PENDING  Incomplete   Assessment/Plan: S/P Procedure(s) (LRB): REDO STERNOTOMY, REDO AORTIC VALVE REPLACEMENT WITH HUMAN ALLOGRAFT INCLUDING AORTIC ROOT REPLACEMENT,  REPAIR ASCENDING THORACIC AORTIC ANEURYSM (N/A) TRANSESOPHAGEAL ECHOCARDIOGRAM (TEE) (N/A)  1 conts to do clinically very well, walking multiple laps around the unit.  2 Heart block remains but clinically tolerating well 3 hypokalemia- replace 4 Creat stable and trending lower from peak, currently 1.42. GFR 52 5 leukocytosis conts to trend towards normal 6 ABL anemia is stable and asymptomatic 7 Thrombocytopenia is resolved 8 BS well controlled 9 LFT's normal , T.Bili is slightly elevated and trending up at 1.7- monitor, no clinical signs of jaundice or hemolysis 10 On rifampin and nafcillin- staph epi- sens to rifampin  11 cont pulm toilet for mild atx, cont to mobilize- ambulating well  LOS: 15 days    John Giovanni PA-C 01/30/2018 Pager [phone]  I  have seen and examined the patient and agree with the assessment and plan as outlined.  Looks great and reports feeling well.  Still in CHB but no bradycardia.  Will ask EPS team to  see him in consult to consider PPM placement versus possible d/c home to observe rhythm a bit longer.  Leave temporary pacer off.  Transfer step down.  [name], MD 01/30/2018 8:25 AM

## 2018-01-30 NOTE — Progress Notes (Signed)
Text page Dr. Emilio Aspen about patient being on epicardial pacer VVI at 80 and having an occ. Episodes where heart rate drops to Upper 20's to lower 30"s CHB. patient voiced no complaints . V.S.S. and skin warm and dry. He will come up and assess patient and pacer

## 2018-01-30 NOTE — Consult Note (Signed)
ELECTROPHYSIOLOGY CONSULT NOTE  Patient ID: Patrick Walter, MRN: [id number], DOB/AGE: 1955/07/24 62 y.o. Admit date: 01/15/2018 Date of Consult: 01/30/2018  Primary Physician: [name], MD Primary Cardiologist: Ysidro Ramsay Vanriper is a 62 y.o. male who is being seen today for the evaluation of heart block at the request of Dr CO.   Chief Complaint: ,As above    HPI Patrick Walter is a 62 y.o. male with a history of a bicuspid aortic valve for which he underwent bioprosthetic replacement in 2015.  He unfortunately developed systemic signs of illness.   4 out of 4 blood cultures were positive for staph epi-methicillin sensitive.  TEE demonstrated vegetations consistent with prosthetic valve endocarditis   11/7 he underwent redo aortic valve replacement with a human allograft aortic root replacement and resection of an ascending thoracic aortic aneurysm.  Postoperatively he was noted to have high-grade heart block with new left bundle branch block and 2: 1 conduction with prolonged PR interval (about 400 ms) this morning he is noted to be in complete heart block. Is walking the halls.  He is not had lightheadedness or dizziness.  PICC line is pending for long-term intravenous antibiotics   Past Medical History:  Diagnosis Date  . Aortic stenosis   . Ascending aorta enlargement ([place])   . Bicuspid aortic valve   . Heart murmur   . Hypertension   . Patent foramen ovale    Closed at the time of aortic valve replacement  . Pilonidal cyst   . S/P aortic valve replacement with bioprosthetic valve 12/05/2013   25 mm [facility] Ease bovine pericardial tissue valve   . S/P redo aortic valve replacement with human allograft aortic root graft 01/26/2018  . Septic embolism (HCC)    Brain, spleen, superior mesenteric artery      Surgical History:  Past Surgical History:  Procedure Laterality Date  . AORTIC VALVE REPLACEMENT N/A 12/05/2013   Procedure: AORTIC VALVE  REPLACEMENT (AVR);  Surgeon: [name], MD;  Location: [place];  Service: Open Heart Surgery;  Laterality: N/A;  . AORTIC VALVE REPLACEMENT N/A 01/26/2018   Procedure: REDO STERNOTOMY, REDO AORTIC VALVE REPLACEMENT WITH HUMAN ALLOGRAFT INCLUDING AORTIC ROOT REPLACEMENT,  REPAIR ASCENDING THORACIC AORTIC ANEURYSM;  Surgeon: [name], MD;  Location: [place];  Service: Open Heart Surgery;  Laterality: N/A;  . HAND SURGERY Left 07/1977  . INTRAOPERATIVE TRANSESOPHAGEAL ECHOCARDIOGRAM N/A 12/05/2013   Procedure: INTRAOPERATIVE TRANSESOPHAGEAL ECHOCARDIOGRAM;  Surgeon: [name], MD;  Location: [place];  Service: Open Heart Surgery;  Laterality: N/A;  . LEFT AND RIGHT HEART CATHETERIZATION WITH CORONARY ANGIOGRAM N/A 11/08/2013   Procedure: LEFT AND RIGHT HEART CATHETERIZATION WITH CORONARY ANGIOGRAM;  Surgeon: Blane Ohara, MD;  Location: [facility] CATH LAB;  Service: Cardiovascular;  Laterality: N/A;  . PATENT FORAMEN OVALE CLOSURE N/A 12/05/2013   Procedure: PATENT FORAMEN OVALE CLOSURE;  Surgeon: [name], MD;  Location: [place];  Service: Open Heart Surgery;  Laterality: N/A;  . PILONIDAL CYST / SINUS EXCISION  01/20/2018   Dr Brantley Stage  . TEE WITHOUT CARDIOVERSION N/A 10/23/2013   Procedure: TRANSESOPHAGEAL ECHOCARDIOGRAM (TEE);  Surgeon: Larey Dresser, MD;  Location: [place];  Service: Cardiovascular;  Laterality: N/A;  . TEE WITHOUT CARDIOVERSION N/A 01/19/2018   Procedure: TRANSESOPHAGEAL ECHOCARDIOGRAM (TEE);  Surgeon: Sanda Chadrick Sprinkle, MD;  Location: [place];  Service: Cardiovascular;  Laterality: N/A;  . TEE WITHOUT CARDIOVERSION N/A 01/26/2018   Procedure: TRANSESOPHAGEAL ECHOCARDIOGRAM (TEE);  Surgeon:  [name], MD;  Location: [place];  Service: Open Heart Surgery;  Laterality: N/A;  . VASECTOMY  06/2002     Home Meds: Prior to Admission medications   Medication Sig Start Date End Date Taking? Authorizing Provider  amoxicillin (AMOXIL) 500 MG tablet Take 4  tablets (2,000 mg) one hour prior to dental appointments. 11/16/17  Yes [name], MD  aspirin 81 MG EC tablet Take 1 tablet (81 mg total) by mouth daily. 05/28/14  Yes [name], MD  Boswellia-Glucosamine-Vit D (GLUCOSAMINE COMPLEX PO) Take 1 capsule by mouth daily.   Yes [provider]  celecoxib (CELEBREX) 200 MG capsule TAKE 1 TO 2 CAPSULES BY MOUTH EVERY DAY AS NEEDED FOR PAIN Patient taking differently: Take 200-400 mg by mouth daily as needed for mild pain.  10/31/17  Yes [name], MD  Coenzyme Q10 300 MG CAPS Take 1 capsule by mouth daily.   Yes [provider]  Multiple Vitamin (MULTIVITAMIN WITH MINERALS) TABS tablet Take 1 tablet by mouth daily.   Yes [provider]  Omega 3 1000 MG CAPS Take 3 capsules by mouth daily.   Yes [provider]  Probiotic Product (PROBIOTIC PO) Take 1 capsule by mouth daily.   Yes [provider]  TURMERIC PO Take 1 capsule by mouth daily.   Yes [provider]  triamcinolone cream (KENALOG) 0.1 % Apply 1 application topically 2 (two) times daily. Patient not taking: Reported on 01/16/2018 06/10/17   [name], MD    Inpatient Medications:  . acetaminophen  1,000 mg Oral Q6H  . aspirin EC  325 mg Oral Daily  . bisacodyl  10 mg Oral Daily  . Chlorhexidine Gluconate Cloth  6 each Topical Daily  . docusate sodium  200 mg Oral Daily  . ferrous [id number] C-folic acid  1 capsule Oral TID PC  . pantoprazole  40 mg Oral Daily  . potassium chloride  40 mEq Oral Q4H  . rifampin  300 mg Oral TID WC  . sodium chloride flush  10-40 mL Intracatheter Q12H  . sodium chloride flush  3 mL Intravenous Q12H      Allergies: No Known Allergies  Social History   Socioeconomic History  . Marital status: Married    Spouse name: Not on file  . Number of children: 5  . Years of education: Not on file  . Highest education level: Not on file  Occupational History  .  Occupation: Scientist, forensic: [employer]  . Financial resource strain: Not on file  . Food insecurity:    Worry: Not on file    Inability: Not on file  . Transportation needs:    Medical: Not on file    Non-medical: Not on file  Tobacco Use  . Smoking status: Never Smoker  . Smokeless tobacco: Never Used  Substance and Sexual Activity  . Alcohol use: Yes    Alcohol/week: 0.0 standard drinks    Comment: occ  . Drug use: No  . Sexual activity: Not on file  Lifestyle  . Physical activity:    Days per week: Not on file    Minutes per session: Not on file  . Stress: Not on file  Relationships  . Social connections:    Talks on phone: Not on file    Gets together: Not on file    Attends religious service: Not on file    Active member of club or  organization: Not on file    Attends meetings of clubs or organizations: Not on file    Relationship status: Not on file  . Intimate partner violence:    Fear of current or ex partner: Not on file    Emotionally abused: Not on file    Physically abused: Not on file    Forced sexual activity: Not on file  Other Topics Concern  . Not on file  Social History Narrative   The patient is married. He is originally from [place]. He works as an Art gallery manager. He does not smoke cigarettes or drink alcohol.     Family History  Problem Relation Age of Onset  . Diabetes Mother   . Cancer Mother        colon  . Diabetes Father   . Cancer Father        colon  . Colon cancer Father 31  . Diabetes Paternal Grandmother   . Cancer Paternal Grandmother   . Colon cancer Paternal Grandmother 20  . Cancer Daughter        leukemia  . Heart attack Maternal Grandmother   . Hypertension Maternal Grandmother   . Diabetes Maternal Grandmother   . Cancer Maternal Grandmother   . Diabetes Maternal Grandfather   . Cancer Maternal Grandfather   . Stroke Paternal Grandfather   . Diabetes Paternal Grandfather   .  Cancer Paternal Grandfather      ROS:  Please see the history of present illness.     All other systems reviewed and negative.    Physical Exam:  Blood pressure 104/62, pulse (!) 44, temperature 98.3 F (36.8 C), temperature source Oral, resp. rate (!) 21, height 5\' 11"  (1.803 m), weight 79.2 kg, SpO2 97 %. General: Well developed, well nourished male in no acute distress. Head: Normocephalic, atraumatic, sclera non-icteric, no xanthomas, nares are without discharge. EENT: normal Lymph Nodes:  none Back: without scoliosis/kyphosis , no CVA tendersness Neck: Negative for carotid bruits. JVD not elevated. Lungs: Clear bilaterally to auscultation without wheezes, rales, or rhonchi. Breathing is unlabored. Heart: RRR with S1 S2.  2/6 systolic  murmur , rubs, or gallops appreciated. Abdomen: Soft, non-tender, non-distended with normoactive bowel sounds. No hepatomegaly. No rebound/guarding. No obvious abdominal masses. Msk:  Strength and tone appear normal for age. Extremities: No clubbing or cyanosis. No * edema.  Distal pedal pulses are 2+ and equal bilaterally. Skin: Warm and Dry Neuro: Alert and oriented X 3. CN III-XII intact Grossly normal sensory and motor function . Psych:  Responds to questions appropriately with a normal affect.      Labs: Cardiac Enzymes No results for input(s): CKTOTAL, CKMB, TROPONINI in the last 72 hours. CBC Lab Results  Component Value Date   WBC 11.6 (H) 01/30/2018   HGB 8.2 (L) 01/30/2018   HCT 27.1 (L) 01/30/2018   MCV 88.3 01/30/2018   PLT 347 01/30/2018   PROTIME: No results for input(s): LABPROT, INR in the last 72 hours. Chemistry  Recent Labs  Lab 01/30/18 0308  NA 140  K 3.4*  CL 107  CO2 29  BUN 15  CREATININE 1.42*  CALCIUM 9.1  PROT 4.6*  BILITOT 1.7*  ALKPHOS 64  ALT 13  AST 21  GLUCOSE 102*   Lipids Lab Results  Component Value Date   CHOL 171 12/06/2017   HDL 48 12/06/2017   LDLCALC 107 (H) 12/06/2017   TRIG 75  12/06/2017   BNP No results found for: PROBNP Thyroid  Function Tests: No results for input(s): TSH, T4TOTAL, T3FREE, THYROIDAB in the last 72 hours.  Invalid input(s): FREET3    Miscellaneous No results found for: DDIMER  Radiology/Studies:    EKG: 28 October QRS narrow 105 ms ECG 27 January 2018 sinus rhythm about 100 bpm with 2-1 conduction and left bundle branch block PR interval 400+ milliseconds ECG 11 November complete heart block 90 bpm with a right bundle branch block escape QRS duration milliseconds   Assessment and Plan:  Bicuspid aortic valve status post aortic valve replacement complicated by prostatic valve endocarditis-staph epi  Status post aortic valve replacement redo with a human allograft aortic root replacement and resection of an ascending thoracic aortic aneurysm  Staph epi endocarditis (ID note 11/8 referred to [facility])  Complete heart block see description of evolving ECGs above  LV function-normal   Patient has progressive heart block/conduction system disease following aortic valve redo surgery for prosthetic valve endocarditis.  Progressive conduction system disease might represent either edema and/or progressive destruction of the conducting system.  I am not quite sure what the timeframe would be of the former although  I would expect to see some improvement within about 7 days.  If we were to see improvement, given his functional status, it might be reasonable to defer with home outpatient telemetry.  I have not sanguine however he will avoid coming to pacing.  I reviewed that with him today as well as his wife who is a former NICU nurse I discussed this also with Dr. Roxy Manns.\ \ There is also the issue of infectious risk of an implanted endovascular device; however, in the context of his aortic valve  this risk pales.  We will follow with you.    Virl Axe

## 2018-01-30 NOTE — Progress Notes (Signed)
Peripherally Inserted Central Catheter/Midline Placement  The IV Nurse has discussed with the patient and/or persons authorized to consent for the patient, the purpose of this procedure and the potential benefits and risks involved with this procedure.  The benefits include less needle sticks, lab draws from the catheter, and the patient may be discharged home with the catheter. Risks include, but not limited to, infection, bleeding, blood clot (thrombus formation), and puncture of an artery; nerve damage and irregular heartbeat and possibility to perform a PICC exchange if needed/ordered by physician.  Alternatives to this procedure were also discussed.  Bard Power PICC patient education guide, fact sheet on infection prevention and patient information card has been provided to patient /or left at bedside.  PICC inserted by Aldona Lento, RN   PICC/Midline Placement Documentation  PICC Double Lumen 34/74/25 PICC Left Basilic 46 cm 0 cm (Active)  Exposed Catheter (cm) 0 cm 01/30/2018 10:08 PM  Site Assessment Clean;Dry;Intact 01/30/2018 10:08 PM  Lumen #1 Status Flushed;Saline locked;Blood return noted 01/30/2018 10:08 PM  Lumen #2 Status Flushed;Saline locked;Blood return noted 01/30/2018 10:08 PM  Dressing Type Transparent 01/30/2018 10:08 PM  Dressing Status Dry;Clean;Intact 01/30/2018 10:08 PM  Dressing Intervention New dressing 01/30/2018 10:08 PM  Dressing Change Due 02/06/18 01/30/2018 10:08 PM       Patrick Walter, Nicolette Bang 01/30/2018, 10:08 PM

## 2018-01-30 NOTE — Significant Event (Signed)
Rapid Response Event Note  Overview:Called d/t pt HR complete heart block-30s.  Pt had redo AVR with subsequent CHB on 11/7.  Pt HR dropped to 30s on day shift today and  epicardial pacing reinitiated at VVI-50s.  Pt began to have occasional pacing spikes with no capture this PM.   Time Called: 2008 Arrival Time: 2015 Event Type: Cardiac  Initial Focused Assessment: Pt laying in bed alert and oriented. Skin warm and dry. HR-50 (vpaced), BP-111/61, RR-21, SpO2-98% on RA.  EKG strip reviewed and occasional pacer spike with failure to capture.  Pacemake reviewed, pacer attached correctly to pt, is in VVI mode, rate-50, sensitivity-1.  Family at bedside and concerned about drop in patients HR.  Interventions: Pt alert and oriented with no drop in BP.  Occasional pacer spike not capturing but otherwise hemodynamically stable.  EP consulted for PPM at some point if no improvement seen. Plan of Care (if not transferred): Continue to monitor pt.  Call RRT if pt mental status or BP drops with drop in HR or if further assistance needed. Event Summary: Dr. Emilio Aspen at 2040    at    Outcome: Stayed in room and stabalized     [place], Carren Rang

## 2018-01-31 ENCOUNTER — Encounter (HOSPITAL_COMMUNITY)
Admission: EM | Disposition: A | Discharge status: Home Health | Payer: Self-pay | Source: Home / Self Care | Attending: Thoracic Surgery (Cardiothoracic Vascular Surgery)

## 2018-01-31 DIAGNOSIS — I442 Atrioventricular block, complete: Secondary | ICD-10-CM

## 2018-01-31 HISTORY — PX: PACEMAKER IMPLANT: EP1218

## 2018-01-31 LAB — BASIC METABOLIC PANEL
Anion gap: 4 — ABNORMAL LOW (ref 5–15)
BUN: 18 mg/dL (ref 6–20)
CALCIUM: 9.3 mg/dL (ref 8.9–10.3)
CHLORIDE: 110 mmol/L (ref 98–111)
CO2: 27 mmol/L (ref 22–32)
CREATININE: 1.56 mg/dL — AB (ref 0.61–1.24)
GFR calc non Af Amer: 47 mL/min — ABNORMAL LOW (ref >60)
GFR, EST AFRICAN AMERICAN: 54 mL/min — AB (ref >60)
Glucose, Bld: 109 mg/dL — ABNORMAL HIGH (ref 70–99)
Potassium: 4.1 mmol/L (ref 3.5–5.1)
SODIUM: 141 mmol/L (ref 135–145)

## 2018-01-31 LAB — AEROBIC/ANAEROBIC CULTURE (SURGICAL/DEEP WOUND)
CULTURE: NO GROWTH
GRAM STAIN: NONE SEEN

## 2018-01-31 LAB — AEROBIC/ANAEROBIC CULTURE W GRAM STAIN (SURGICAL/DEEP WOUND): Culture: NO GROWTH

## 2018-01-31 SURGERY — PACEMAKER IMPLANT

## 2018-01-31 MED ORDER — SODIUM CHLORIDE 0.9% FLUSH: 3.0000 mL | Freq: Two times a day (BID) | INTRAVENOUS | Status: DC

## 2018-01-31 MED ORDER — SODIUM CHLORIDE 0.9 % IV SOLN
INTRAVENOUS | Status: AC
  Filled 2018-01-31: qty 2

## 2018-01-31 MED ORDER — FENTANYL CITRATE (PF) 100 MCG/2ML IJ SOLN
INTRAMUSCULAR | Status: AC
  Filled 2018-01-31: qty 2

## 2018-01-31 MED ORDER — SODIUM CHLORIDE 0.9 % IV SOLN
INTRAVENOUS | Status: DC
  Administered 2018-01-31: 13:00:00 via INTRAVENOUS

## 2018-01-31 MED ORDER — SODIUM CHLORIDE 0.9 % IV SOLN
2.0000 g | INTRAVENOUS | Status: DC
  Administered 2018-01-31 – 2018-02-01 (×6): 2 g via INTRAVENOUS
  Filled 2018-01-31 (×10): qty 2000

## 2018-01-31 MED ORDER — IOPAMIDOL (ISOVUE-370) INJECTION 76%
INTRAVENOUS | Status: AC
  Filled 2018-01-31: qty 50

## 2018-01-31 MED ORDER — ONDANSETRON HCL 4 MG/2ML IJ SOLN
4.0000 mg | Freq: Four times a day (QID) | INTRAMUSCULAR | Status: DC | PRN
  Administered 2018-02-01: 4 mg via INTRAVENOUS
  Filled 2018-01-31: qty 2

## 2018-01-31 MED ORDER — HEPARIN (PORCINE) IN NACL 1000-0.9 UT/500ML-% IV SOLN
INTRAVENOUS | Status: AC
  Filled 2018-01-31: qty 500

## 2018-01-31 MED ORDER — SODIUM CHLORIDE 0.9 % IV SOLN: 250.0000 mL | INTRAVENOUS | Status: DC | PRN

## 2018-01-31 MED ORDER — CEFAZOLIN SODIUM-DEXTROSE 1-4 GM/50ML-% IV SOLN
1.0000 g | Freq: Four times a day (QID) | INTRAVENOUS | Status: AC
  Administered 2018-01-31 – 2018-02-01 (×3): 1 g via INTRAVENOUS
  Filled 2018-01-31 (×3): qty 50

## 2018-01-31 MED ORDER — CEFAZOLIN SODIUM-DEXTROSE 2-4 GM/100ML-% IV SOLN
2.0000 g | INTRAVENOUS | Status: AC
  Administered 2018-01-31: 2 g via INTRAVENOUS
  Filled 2018-01-31 (×2): qty 100

## 2018-01-31 MED ORDER — CHLORHEXIDINE GLUCONATE 4 % EX LIQD
60.0000 mL | Freq: Once | CUTANEOUS | Status: DC
  Administered 2018-01-31: 4 via TOPICAL
  Filled 2018-01-31: qty 60

## 2018-01-31 MED ORDER — MIDAZOLAM HCL 5 MG/5ML IJ SOLN
INTRAMUSCULAR | Status: DC | PRN
  Administered 2018-01-31: 2 mg via INTRAVENOUS
  Administered 2018-01-31: 1 mg via INTRAVENOUS

## 2018-01-31 MED ORDER — LIDOCAINE HCL (PF) 1 % IJ SOLN
INTRAMUSCULAR | Status: DC | PRN
  Administered 2018-01-31: 60 mL

## 2018-01-31 MED ORDER — FENTANYL CITRATE (PF) 100 MCG/2ML IJ SOLN
INTRAMUSCULAR | Status: DC | PRN
  Administered 2018-01-31: 12.5 ug via INTRAVENOUS
  Administered 2018-01-31: 25 ug via INTRAVENOUS

## 2018-01-31 MED ORDER — ACETAMINOPHEN 325 MG PO TABS
325.0000 mg | ORAL_TABLET | ORAL | Status: DC | PRN
  Administered 2018-02-01 (×2): 650 mg via ORAL
  Filled 2018-01-31 (×2): qty 2

## 2018-01-31 MED ORDER — LIDOCAINE HCL (PF) 1 % IJ SOLN
INTRAMUSCULAR | Status: AC
  Filled 2018-01-31: qty 60

## 2018-01-31 MED ORDER — HEPARIN (PORCINE) IN NACL 1000-0.9 UT/500ML-% IV SOLN
INTRAVENOUS | Status: DC | PRN
  Administered 2018-01-31: 500 mL

## 2018-01-31 MED ORDER — CHLORHEXIDINE GLUCONATE 4 % EX LIQD: 60.0000 mL | Freq: Once | CUTANEOUS | Status: DC

## 2018-01-31 MED ORDER — SODIUM CHLORIDE 0.9% FLUSH: 3.0000 mL | INTRAVENOUS | Status: DC | PRN

## 2018-01-31 MED ORDER — CEFAZOLIN SODIUM-DEXTROSE 2-4 GM/100ML-% IV SOLN
INTRAVENOUS | Status: AC
  Filled 2018-01-31: qty 100

## 2018-01-31 MED ORDER — TRAMADOL HCL 50 MG PO TABS: 50.0000 mg | ORAL_TABLET | Freq: Four times a day (QID) | ORAL | Status: DC | PRN

## 2018-01-31 MED ORDER — SODIUM CHLORIDE 0.9 % IV SOLN
80.0000 mg | INTRAVENOUS | Status: AC
  Administered 2018-01-31: 80 mg
  Filled 2018-01-31 (×3): qty 2

## 2018-01-31 MED ORDER — MIDAZOLAM HCL 5 MG/5ML IJ SOLN
INTRAMUSCULAR | Status: AC
  Filled 2018-01-31: qty 5

## 2018-01-31 SURGICAL SUPPLY — 7 items
CABLE SURGICAL S-101-97-12 (CABLE) ×2 IMPLANT
LEAD TENDRIL MRI 52CM LPA1200M (Lead) ×2 IMPLANT
LEAD TENDRIL MRI 58CM LPA1200M (Lead) ×2 IMPLANT
PACEMAKER ASSURITY DR-RF (Pacemaker) ×2 IMPLANT
PAD PRO RADIOLUCENT 2001M-C (PAD) ×2 IMPLANT
SHEATH CLASSIC 8F (SHEATH) ×6 IMPLANT
TRAY PACEMAKER INSERTION (PACKS) ×2 IMPLANT

## 2018-01-31 NOTE — H&P (View-Only) (Signed)
Progress Note   Subjective   Doing well today, the patient denies CP or SOB.  No new concerns  Inpatient Medications    Scheduled Meds: . acetaminophen  1,000 mg Oral Q6H  . aspirin EC  325 mg Oral Daily  . bisacodyl  10 mg Oral Daily  . Chlorhexidine Gluconate Cloth  6 each Topical Daily  . docusate sodium  200 mg Oral Daily  . ferrous [id number] C-folic acid  1 capsule Oral TID PC  . pantoprazole  40 mg Oral Daily  . rifampin  300 mg Oral TID WC  . sodium chloride flush  10-40 mL Intracatheter Q12H  . sodium chloride flush  10-40 mL Intracatheter Q12H  . sodium chloride flush  3 mL Intravenous Q12H   Continuous Infusions: . sodium chloride    . nafcillin IV 2 g (01/31/18 0850)   PRN Meds: fentaNYL (SUBLIMAZE) injection, ondansetron (ZOFRAN) IV, oxyCODONE, sodium chloride flush, sodium chloride flush, sodium chloride flush, traMADol   Vital Signs    Vitals:   01/30/18 2014 01/31/18 0004 01/31/18 0351 01/31/18 0730  BP: 111/61 101/67 116/77 127/76  Pulse: (!) 50     Resp: (!) 21 (!) 21 13 14   Temp: 98.1 F (36.7 C) (!) 97.5 F (36.4 C) 98.1 F (36.7 C) 97.8 F (36.6 C)  TempSrc: Oral Oral Oral Oral  SpO2: 98% 96% 100% 97%  Weight:   80.2 kg   Height:        Intake/Output Summary (Last 24 hours) at 01/31/2018 1039 Last data filed at 01/31/2018 1287 Gross per 24 hour  Intake 800 ml  Output 600 ml  Net 200 ml   Filed Weights   01/28/18 0500 01/29/18 0500 01/31/18 0351  Weight: 82.7 kg 79.2 kg 80.2 kg    Telemetry    Sinus with complete heart block - Personally Reviewed  Physical Exam   GEN- The patient is well appearing, alert and oriented x 3 today.   Head- normocephalic, atraumatic Eyes-  Sclera clear, conjunctiva pink Ears- hearing intact Oropharynx- clear Neck- supple, Lungs- Clear to ausculation bilaterally, normal work of breathing Heart- bradycardic regular rhythm  GI- soft, NT, ND, + BS Extremities- no clubbing, cyanosis, or  edema  MS- no significant deformity or atrophy Skin- no rash or lesion Psych- euthymic mood, full affect Neuro- strength and sensation are intact   Labs    Chemistry Recent Labs  Lab 01/25/18 0321  01/29/18 0450 01/29/18 1730 01/30/18 0308 01/31/18 0252  NA 134*   < > 140 138 140 141  K 4.3   < > 3.5 3.2* 3.4* 4.1  CL 101   < > 107 105 107 110  CO2 26   < > 26 26 29 27   GLUCOSE 116*   < > 94 99 102* 109*  BUN 16   < > 17 19 15 18   CREATININE 0.99   < > 1.50* 1.44* 1.42* 1.56*  CALCIUM 10.3   < > 9.2 8.9 9.1 9.3  PROT 6.2*  --  4.9*  --  4.6*  --   ALBUMIN 2.8*  --  2.1*  --  2.0*  --   AST 18  --  18  --  21  --   ALT 15  --  12  --  13  --   ALKPHOS 94  --  51  --  64  --   BILITOT 0.7  --  1.6*  --  1.7*  --  GFRNONAA >60   < > 49* 51* 52* 47*  GFRAA >60   < > 57* 60* >60 54*  ANIONGAP 7   < > 7 7 4* 4*   < > = values in this interval not displayed.     Hematology Recent Labs  Lab 01/28/18 0741 01/29/18 0450 01/30/18 0308  WBC 15.1* 13.8* 11.6*  RBC 3.07* 3.07* 3.07*  HGB 8.4* 8.4* 8.2*  HCT 27.1* 27.3* 27.1*  MCV 88.3 88.9 88.3  MCH 27.4 27.4 26.7  MCHC 31.0 30.8 30.3  RDW 14.2 14.5 14.6  PLT 193 180 347    Cardiac EnzymesNo results for input(s): TROPONINI in the last 168 hours. No results for input(s): TROPIPOC in the last 168 hours.      Assessment & Plan    1.  Complete heart block post AV surgery I have spoken at length with Dr Roxy Manns.  He does not feel that AV conduction will improve.  I would therefore advise PPM implant at this time. Risks, benefits, alternatives to pacemaker implantation were discussed in detail with the patient today. The patient understands that the risks include but are not limited to bleeding, infection, pneumothorax, perforation, tamponade, vascular damage, renal failure, MI, stroke, death,  and lead dislodgement and wishes to proceed. We will therefore schedule the procedure at the next available time.  NPO for the  procedure lated today   Thompson Grayer MD, [facility] 01/31/2018 10:39 AM

## 2018-01-31 NOTE — Progress Notes (Signed)
This RN spoke with IV team nurse to call the staff in [place] EP lab on [phone] about the need for the patient to have a PIV on the left arm without which the patient will not be able to have his permanent pacemaker placed.

## 2018-01-31 NOTE — Progress Notes (Signed)
Progress Note   Subjective   Doing well today, the patient denies CP or SOB.  No new concerns  Inpatient Medications    Scheduled Meds: . acetaminophen  1,000 mg Oral Q6H  . aspirin EC  325 mg Oral Daily  . bisacodyl  10 mg Oral Daily  . Chlorhexidine Gluconate Cloth  6 each Topical Daily  . docusate sodium  200 mg Oral Daily  . ferrous [id number] C-folic acid  1 capsule Oral TID PC  . pantoprazole  40 mg Oral Daily  . rifampin  300 mg Oral TID WC  . sodium chloride flush  10-40 mL Intracatheter Q12H  . sodium chloride flush  10-40 mL Intracatheter Q12H  . sodium chloride flush  3 mL Intravenous Q12H   Continuous Infusions: . sodium chloride    . nafcillin IV 2 g (01/31/18 0850)   PRN Meds: fentaNYL (SUBLIMAZE) injection, ondansetron (ZOFRAN) IV, oxyCODONE, sodium chloride flush, sodium chloride flush, sodium chloride flush, traMADol   Vital Signs    Vitals:   01/30/18 2014 01/31/18 0004 01/31/18 0351 01/31/18 0730  BP: 111/61 101/67 116/77 127/76  Pulse: (!) 50     Resp: (!) 21 (!) 21 13 14   Temp: 98.1 F (36.7 C) (!) 97.5 F (36.4 C) 98.1 F (36.7 C) 97.8 F (36.6 C)  TempSrc: Oral Oral Oral Oral  SpO2: 98% 96% 100% 97%  Weight:   80.2 kg   Height:        Intake/Output Summary (Last 24 hours) at 01/31/2018 1039 Last data filed at 01/31/2018 8416 Gross per 24 hour  Intake 800 ml  Output 600 ml  Net 200 ml   Filed Weights   01/28/18 0500 01/29/18 0500 01/31/18 0351  Weight: 82.7 kg 79.2 kg 80.2 kg    Telemetry    Sinus with complete heart block - Personally Reviewed  Physical Exam   GEN- The patient is well appearing, alert and oriented x 3 today.   Head- normocephalic, atraumatic Eyes-  Sclera clear, conjunctiva pink Ears- hearing intact Oropharynx- clear Neck- supple, Lungs- Clear to ausculation bilaterally, normal work of breathing Heart- bradycardic regular rhythm  GI- soft, NT, ND, + BS Extremities- no clubbing, cyanosis, or  edema  MS- no significant deformity or atrophy Skin- no rash or lesion Psych- euthymic mood, full affect Neuro- strength and sensation are intact   Labs    Chemistry Recent Labs  Lab 01/25/18 0321  01/29/18 0450 01/29/18 1730 01/30/18 0308 01/31/18 0252  NA 134*   < > 140 138 140 141  K 4.3   < > 3.5 3.2* 3.4* 4.1  CL 101   < > 107 105 107 110  CO2 26   < > 26 26 29 27   GLUCOSE 116*   < > 94 99 102* 109*  BUN 16   < > 17 19 15 18   CREATININE 0.99   < > 1.50* 1.44* 1.42* 1.56*  CALCIUM 10.3   < > 9.2 8.9 9.1 9.3  PROT 6.2*  --  4.9*  --  4.6*  --   ALBUMIN 2.8*  --  2.1*  --  2.0*  --   AST 18  --  18  --  21  --   ALT 15  --  12  --  13  --   ALKPHOS 94  --  51  --  64  --   BILITOT 0.7  --  1.6*  --  1.7*  --  GFRNONAA >60   < > 49* 51* 52* 47*  GFRAA >60   < > 57* 60* >60 54*  ANIONGAP 7   < > 7 7 4* 4*   < > = values in this interval not displayed.     Hematology Recent Labs  Lab 01/28/18 0741 01/29/18 0450 01/30/18 0308  WBC 15.1* 13.8* 11.6*  RBC 3.07* 3.07* 3.07*  HGB 8.4* 8.4* 8.2*  HCT 27.1* 27.3* 27.1*  MCV 88.3 88.9 88.3  MCH 27.4 27.4 26.7  MCHC 31.0 30.8 30.3  RDW 14.2 14.5 14.6  PLT 193 180 347    Cardiac EnzymesNo results for input(s): TROPONINI in the last 168 hours. No results for input(s): TROPIPOC in the last 168 hours.      Assessment & Plan    1.  Complete heart block post AV surgery I have spoken at length with Dr Roxy Manns.  He does not feel that AV conduction will improve.  I would therefore advise PPM implant at this time. Risks, benefits, alternatives to pacemaker implantation were discussed in detail with the patient today. The patient understands that the risks include but are not limited to bleeding, infection, pneumothorax, perforation, tamponade, vascular damage, renal failure, MI, stroke, death,  and lead dislodgement and wishes to proceed. We will therefore schedule the procedure at the next available time.  NPO for the  procedure lated today   Thompson Grayer MD, [facility] 01/31/2018 10:39 AM

## 2018-01-31 NOTE — Progress Notes (Addendum)
[facility] for Infectious Disease  Date of Admission:  01/15/2018     Total days of antibiotics 17         ASSESSMENT/PLAN  Patrick Walter is now POD 5 from redo aortic valve replacement with human allograft including aortic root replacement and is on Day 5 of nafcillin and rifampin for previous Staphylococcus Epirdermidis bacteremia. He continues to progress and has remained afebrile. Cardiology working with bradycardia and will be scheduled for pacemaker insertion. PICC line is in place for prolonged IV antibiotic therapy with Cefazolin and Rifampin for a total of 6 weeks. Continue to monitor kidney function as this may be related to his Rifampin.   1. Continue Nafcillin. 2. OPAT orders below. 3. Follow up in ID clinic in 4 weeks.    Diagnosis: Staphylococcus Epidermidis Bacteremia with Redo Aortic Valve replacement  Culture Result: Staphylococcus Epidermidis   No Known Allergies   OPAT Orders Discharge antibiotics: Nafcillin and Rifampin  Per pharmacy protocol   Duration:  6 weeks   End Date: 03/09/18  [facility] Care Per Protocol:  Labs weekly while on IV antibiotics: _X_ CBC with differential __ BMP _X_ CMP __ CRP __ ESR __ Vancomycin trough __ CK  _X_ Please pull PIC at completion of IV antibiotics __ Please leave PIC in place until doctor has seen patient or been notified  Fax weekly labs to [phone]  Clinic Follow Up Appt:  4 weeks - 12/11 with Patrick Madeira, NP @2 :30   Principal Problem:   S/P redo aortic valve replacement with human allograft aortic root graft Active Problems:   Essential hypertension, benign   S/P aortic valve replacement with bioprosthetic valve   Primary osteoarthritis of left hip   Hypercalcemia   Normocytic anemia   Staphylococcus epidermidis bacteremia   Prosthetic valve endocarditis (HCC)   Septic embolism (HCC)   Pilonidal cyst   . acetaminophen  1,000 mg Oral Q6H  . aspirin EC  325 mg Oral Daily  .  bisacodyl  10 mg Oral Daily  . Chlorhexidine Gluconate Cloth  6 each Topical Daily  . docusate sodium  200 mg Oral Daily  . ferrous [id number] C-folic acid  1 capsule Oral TID PC  . pantoprazole  40 mg Oral Daily  . rifampin  300 mg Oral TID WC  . sodium chloride flush  10-40 mL Intracatheter Q12H  . sodium chloride flush  10-40 mL Intracatheter Q12H  . sodium chloride flush  3 mL Intravenous Q12H    SUBJECTIVE:  Afebrile overnight. PICC line inserted yesterday. Creatinine of 1.56 today up slighlty from 1.42 with baseline appearing 0.8-0.9. Requiring external pacer at this time and will likely be getting a pacemaker shortly.  Doing well. No problems overnight and denies fevers, chills, or sweats. Eager to go home when able.   No Known Allergies   Review of Systems: Review of Systems  Constitutional: Negative for chills and fever.  Respiratory: Negative for cough, sputum production, shortness of breath and wheezing.   Cardiovascular: Negative for chest pain.  Gastrointestinal: Negative for abdominal pain, constipation, diarrhea, nausea and vomiting.  Skin: Negative for rash.      OBJECTIVE: Vitals:   01/30/18 2014 01/31/18 0004 01/31/18 0351 01/31/18 0730  BP: 111/61 101/67 116/77 127/76  Pulse: (!) 50     Resp: (!) 21 (!) 21 13 14   Temp: 98.1 F (36.7 C) (!) 97.5 F (36.4 C) 98.1 F (36.7 C) 97.8 F (36.6 C)  TempSrc: Oral Oral Oral Oral  SpO2: 98% 96% 100% 97%  Weight:   80.2 kg   Height:       Body mass index is 24.67 kg/m.  Physical Exam  Constitutional: He is oriented to person, place, and time. He appears well-developed and well-nourished. No distress.  Seated in the chair; pleasant.   Cardiovascular: Normal rate, regular rhythm, normal heart sounds and intact distal pulses. Exam reveals no gallop and no friction rub.  PICC line in place with dressing that is clean and dry  Pulmonary/Chest: Effort normal and breath sounds normal. No stridor. No  respiratory distress. He has no wheezes. He has no rales.  Surgical incision healing well with no evidence of drainage or infection.   Abdominal: Soft. Bowel sounds are normal.  Neurological: He is alert and oriented to person, place, and time.  Skin: Skin is warm and dry.  Psychiatric: He has a normal mood and affect.    Lab Results Lab Results  Component Value Date   WBC 11.6 (H) 01/30/2018   HGB 8.2 (L) 01/30/2018   HCT 27.1 (L) 01/30/2018   MCV 88.3 01/30/2018   PLT 347 01/30/2018    Lab Results  Component Value Date   CREATININE 1.56 (H) 01/31/2018   BUN 18 01/31/2018   NA 141 01/31/2018   K 4.1 01/31/2018   CL 110 01/31/2018   CO2 27 01/31/2018    Lab Results  Component Value Date   ALT 13 01/30/2018   AST 21 01/30/2018   ALKPHOS 64 01/30/2018   BILITOT 1.7 (H) 01/30/2018     Microbiology: Recent Results (from the past 240 hour(s))  MRSA PCR Screening     Status: None   Collection Time: 01/25/18  1:05 PM  Result Value Ref Range Status   MRSA by PCR NEGATIVE NEGATIVE Final    Comment:        The GeneXpert MRSA Assay (FDA approved for NASAL specimens only), is one component of a comprehensive MRSA colonization surveillance program. It is not intended to diagnose MRSA infection nor to guide or monitor treatment for MRSA infections. Performed at [facility], [place] [address] [place], [place] [address]   Aerobic/Anaerobic Culture (surgical/deep wound)     Status: None (Preliminary result)   Collection Time: 01/26/18 10:53 AM  Result Value Ref Range Status   Specimen Description TISSUE AORTA VALVE VEGETATION  Final   Special Requests NONE  Final   Gram Stain   Final    RARE WBC PRESENT,BOTH PMN AND MONONUCLEAR RARE GRAM POSITIVE COCCI    Culture   Final    NO GROWTH 4 DAYS NO ANAEROBES ISOLATED; CULTURE IN PROGRESS FOR 5 DAYS Performed at [facility], [place] [address] [place], [place] [address]    Report Status PENDING  Incomplete    Aerobic/Anaerobic Culture (surgical/deep wound)     Status: None (Preliminary result)   Collection Time: 01/26/18 10:58 AM  Result Value Ref Range Status   Specimen Description WOUND AORTA VALVE VEGETATION  Final   Special Requests SPEC B ON SWABS  Final   Gram Stain NO WBC SEEN NO ORGANISMS SEEN   Final   Culture   Final    NO GROWTH 4 DAYS NO ANAEROBES ISOLATED; CULTURE IN PROGRESS FOR 5 DAYS Performed at [facility], [place] [address] [place], [place] [address]    Report Status PENDING  Incomplete     Terri Piedra, [place] for [place] Pager  01/31/2018  9:31 AM

## 2018-01-31 NOTE — Progress Notes (Addendum)
      [address]       [place] [address]             [phone]        5 Days Post-Op Procedure(s) (LRB): REDO STERNOTOMY, REDO AORTIC VALVE REPLACEMENT WITH HUMAN ALLOGRAFT INCLUDING AORTIC ROOT REPLACEMENT,  REPAIR ASCENDING THORACIC AORTIC ANEURYSM (N/A) TRANSESOPHAGEAL ECHOCARDIOGRAM (TEE) (N/A)  Subjective: Patient without specific complaints this am  Objective: Vital signs in last 24 hours: Temp:  [97.5 F (36.4 C)-98.3 F (36.8 C)] 97.8 F (36.6 C) (11/12 0730) Pulse Rate:  [44-50] 50 (11/11 2014) Cardiac Rhythm: Ventricular paced;Other (Comment) (11/11 1941) Resp:  [11-22] 14 (11/12 0730) BP: (96-128)/(55-77) 127/76 (11/12 0730) SpO2:  [90 %-100 %] 97 % (11/12 0730) Weight:  [80.2 kg] 80.2 kg (11/12 0351)  Current Weight  01/31/18 80.2 kg       Intake/Output from previous day: 11/11 0701 - 11/12 0700 In: 800 [P.O.:600; IV Piggyback:200] Out: 600 [Urine:600]   Physical Exam:  Cardiovascular: Paced this am, no murmur Pulmonary: Clear to auscultation bilaterally Abdomen: Soft, non tender, bowel sounds present. Extremities: No LE edema Wounds: Clean and dry.  No erythema or signs of infection.  Lab Results: CBC: Recent Labs    01/29/18 0450 01/30/18 0308  WBC 13.8* 11.6*  HGB 8.4* 8.2*  HCT 27.3* 27.1*  PLT 180 347   BMET:  Recent Labs    01/30/18 0308 01/31/18 0252  NA 140 141  K 3.4* 4.1  CL 107 110  CO2 29 27  GLUCOSE 102* 109*  BUN 15 18  CREATININE 1.42* 1.56*  CALCIUM 9.1 9.3    PT/INR:  Lab Results  Component Value Date   INR 1.65 01/26/2018   INR 1.77 01/26/2018   INR 1.17 01/25/2018   ABG:  INR: Will add last result for INR, ABG once components are confirmed Will add last 4 CBG results once components are confirmed  Assessment/Plan:  1. CV - Paced at 50 this am-he went bradycardic into the 30's after transfer and has been externally paced since around shift change. CHB. EP following. Will await  EP evaluation this am. 2.  Pulmonary - On room air. Encourage incentive spirometer. 3. Creatinine slightly increased to 1.56 4.  Acute blood loss anemia - H and H yesterday 8.2 and 27.1. On Trinsicon. 5. ID-Staph endocarditis. On Naficillin and Rifampin.  Donielle M ZimmermanPA-C 01/31/2018,8:02 AM [phone]  I have seen and examined the patient and agree with the assessment and plan as outlined.  Will likely need PPM  [name], MD 01/31/2018 10:01 AM

## 2018-01-31 NOTE — Interval H&P Note (Signed)
History and Physical Interval Note:  01/31/2018 1:55 PM  Patrick Walter  has presented today for surgery, with the diagnosis of hb  The various methods of treatment have been discussed with the patient and family. After consideration of risks, benefits and other options for treatment, the patient has consented to  Procedure(s): PACEMAKER IMPLANT (N/A) as a surgical intervention .  The patient's history has been reviewed, patient examined, no change in status, stable for surgery.  I have reviewed the patient's chart and labs.  Questions were answered to the patient's satisfaction.     Thompson Grayer

## 2018-01-31 NOTE — Progress Notes (Signed)
CARDIAC REHAB PHASE I   PRE:  Rate/Rhythm: 60 CHB    BP: sitting 118/70    SaO2: 98 RA  MODE:  Ambulation: 1060 ft   POST:  Rate/Rhythm: 70-75 CHB    BP: sitting 114/72     SaO2: 98 RA   Pt not pacing at this current time. Able to walk without difficulty, rate 70-75 in CHB. Return to recliner, VSS. Once sitting and resting, pt did begin to pace at 50. Encouraged walking and IS today. [place], ACSM 01/31/2018 9:36 AM

## 2018-01-31 NOTE — Progress Notes (Signed)
VAST RN contacted by unit RN regarding pt's need for LA PIV. Unit RN provided number of cath lab tech to call and get more information. VAST RN spoke with cath lab staff who reported physician stated a PIV placed in L arm is crucial for venogram to visualize Left subclavian vein and determine suitability for pacemaker. Verbalized PIV will be dc'd immediately after pacemaker placement. VAST RN contacted VAST supervisor to determine approval. CMa Rings, VAST AD stated PIV could be placed in this instance for this study, but it is to be removed immediately after pacemaker placement.

## 2018-01-31 NOTE — Progress Notes (Signed)
Patient returned back from cath lab on a hospital bed,placed on tele , ccmd notified, vital signs are stable, patient V pacing on the monitor with rate in the 80's, patient educated on the need for him not to turn to his left side for 24 hours and that he is on bed rest till in the morning, arm sling in place, patient verbalized understanding, no sign of distress noted will monitor.

## 2018-01-31 NOTE — Progress Notes (Signed)
Spoke with unit RN and advised PIV placement in extremity with PICC is contraindicated. Kristin Bruins, RN contacted PA  and advised. As of 1220, Fola, RN has not heard back from physician. VAST to complete consult; advised Kristin Bruins, RN to place another IVT consult if further access is needed.

## 2018-01-31 NOTE — Progress Notes (Signed)
PHARMACY CONSULT NOTE FOR:  OUTPATIENT  PARENTERAL ANTIBIOTIC THERAPY (OPAT)  Indication: MSSE prosthetic valve endocarditis  Regimen: Nafcillin 12 gm daily as a continuous infusion + Rifampin 300 mg TID w/ meals  End date: 03/09/18  IV antibiotic discharge orders are pended. To discharging provider:  please sign these orders via discharge navigator,  Select New Orders & click on the button choice - Manage This Unsigned Work.     Thank you for allowing pharmacy to be a part of this patient's care.  Susa Raring, PharmD, BCPS Infectious Diseases Clinical Pharmacist Phone: [phone] 01/31/2018, 10:03 AM

## 2018-01-31 NOTE — Progress Notes (Signed)
Two IV team nurses were at bedside, attempted to place a PIV in the left arm, 1 PIV placed in the left [facility], IV flushed good no infiltration but no blood return was noted per IV team nurse, cath lab staff notified, instructions received to get the patient ready for transportation to cath where they would  place a PIV needed.

## 2018-01-31 NOTE — Progress Notes (Addendum)
Spoke with IV team RN about the order for a PIV on the left arm, IV team nurse explained to this RN that this is contraindicated because the patient has a PICC on the left arm, Renne PA notified,awaiting further instructions form Dr Rayann Heman

## 2018-01-31 NOTE — Progress Notes (Signed)
Patient assessed with [place], small veins noted for the procedure. Spoke with Kristin Bruins RN and cath lab and made them aware.

## 2018-02-01 ENCOUNTER — Encounter (HOSPITAL_COMMUNITY): Payer: Self-pay | Admitting: Internal Medicine

## 2018-02-01 ENCOUNTER — Inpatient Hospital Stay (HOSPITAL_COMMUNITY): Payer: 59

## 2018-02-01 DIAGNOSIS — I38 Endocarditis, valve unspecified: Secondary | ICD-10-CM | POA: Diagnosis not present

## 2018-02-01 DIAGNOSIS — R7881 Bacteremia: Secondary | ICD-10-CM | POA: Diagnosis not present

## 2018-02-01 DIAGNOSIS — A4101 Sepsis due to Methicillin susceptible Staphylococcus aureus: Secondary | ICD-10-CM | POA: Diagnosis not present

## 2018-02-01 LAB — COMPREHENSIVE METABOLIC PANEL
ALBUMIN: 2 g/dL — AB (ref 3.5–5.0)
ALK PHOS: 107 U/L (ref 38–126)
ALT: 35 U/L (ref 0–44)
ANION GAP: 5 (ref 5–15)
AST: 52 U/L — ABNORMAL HIGH (ref 15–41)
BUN: 13 mg/dL (ref 6–20)
CO2: 25 mmol/L (ref 22–32)
Calcium: 9.1 mg/dL (ref 8.9–10.3)
Chloride: 109 mmol/L (ref 98–111)
Creatinine, Ser: 1.27 mg/dL — ABNORMAL HIGH (ref 0.61–1.24)
GFR calc non Af Amer: 60 mL/min — ABNORMAL LOW (ref >60)
GLUCOSE: 112 mg/dL — AB (ref 70–99)
Potassium: 3.6 mmol/L (ref 3.5–5.1)
SODIUM: 139 mmol/L (ref 135–145)
Total Bilirubin: 1 mg/dL (ref 0.3–1.2)
Total Protein: 4.9 g/dL — ABNORMAL LOW (ref 6.5–8.1)

## 2018-02-01 MED ORDER — NAFCILLIN IV (FOR PTA / DISCHARGE USE ONLY): 12.0000 g | INTRAVENOUS | 0 refills | Status: DC

## 2018-02-01 MED ORDER — OXYCODONE HCL 5 MG PO TABS: 5.0000 mg | ORAL_TABLET | Freq: Four times a day (QID) | ORAL | 0 refills | Status: AC | PRN

## 2018-02-01 MED ORDER — FE FUMARATE-B12-VIT C-FA-IFC PO CAPS: 1.0000 | ORAL_CAPSULE | Freq: Three times a day (TID) | ORAL | 1 refills | Status: DC

## 2018-02-01 MED ORDER — RIFAMPIN 300 MG PO CAPS: 300.0000 mg | ORAL_CAPSULE | Freq: Three times a day (TID) | ORAL | 0 refills | Status: DC

## 2018-02-01 MED ORDER — CEFAZOLIN IV (FOR PTA / DISCHARGE USE ONLY): 2.0000 g | Freq: Three times a day (TID) | INTRAVENOUS | 0 refills | Status: AC

## 2018-02-01 MED ORDER — ASPIRIN 325 MG PO TBEC: 325.0000 mg | DELAYED_RELEASE_TABLET | Freq: Every day | ORAL | Status: DC

## 2018-02-01 MED ORDER — RIFAMPIN 300 MG PO CAPS: 300.0000 mg | ORAL_CAPSULE | Freq: Three times a day (TID) | ORAL | 0 refills | Status: AC

## 2018-02-01 MED FILL — traMADol HCL 50 MG TABS: 50 | 4 days supply | Qty: 30 | Fill #0

## 2018-02-01 MED FILL — FOLIC ACID 1 MG TABS: 1 | 30 days supply | Qty: 30 | Fill #0

## 2018-02-01 MED FILL — FERROUS SULFATE 325 MG TAB: 325 (65 FE) | 30 days supply | Qty: 30 | Fill #0

## 2018-02-01 MED FILL — rifAMPin 300 MG CAPS: 300 | 31 days supply | Qty: 93 | Fill #0

## 2018-02-01 NOTE — Discharge Summary (Signed)
Physician Discharge Summary  Patient ID: Patrick Walter MRN: [id number] DOB/AGE: 08-10-1955 62 y.o.  Admit date: 01/15/2018 Discharge date: 02/01/2018  Admission Diagnoses:  Discharge Diagnoses:  Principal Problem:   S/P redo aortic valve replacement with human allograft aortic root graft Active Problems:   Essential hypertension, benign   S/P aortic valve replacement with bioprosthetic valve   Primary osteoarthritis of left hip   Hypercalcemia   Normocytic anemia   Staphylococcus epidermidis bacteremia   Prosthetic valve endocarditis (HCC)   Septic embolism ([place])   Pilonidal cyst   Patient Active Problem List   Diagnosis Date Noted  . S/P redo aortic valve replacement with human allograft aortic root graft 01/26/2018  . Pilonidal cyst   . Septic embolism ([place])   . Prosthetic valve endocarditis ([place])   . Staphylococcus epidermidis bacteremia   . Normocytic anemia 01/16/2018  . Hypercalcemia 12/07/2017  . Primary osteoarthritis of left hip 07/01/2016  . Ascending aorta enlargement ([place])   . Family history of cancer in father 12/27/2013  . S/P aortic valve replacement with bioprosthetic valve 12/05/2013  . Low back pain 07/24/2013  . Essential hypertension, benign 07/24/2013   Present illness: The patient is a 62 year old male well known to T CTS having undergone previous aortic valve replacement in 2015 with a 25 mm Edwards magna ease pericardial tissue valve and closure of patent foramen ovale.  Was done on December 05, 2013.  His hospital stay was uncomplicated and he was discharged on 12/09/2013.  He has done quite well since surgery and has been routinely followed by Dr. Roxy Manns as well as his cardiologist.  He is done well with significant lifestyle changes as well including 100 pound weight loss and has run 2 different marathons.  Most recently after being in his usual state of health until September this year he developed episodes of fever, malaise and general feeling of  being unwell.  These episodes initially resolved without intervention.  He visited his son in college in [place] who was sick at the time and felt as though there could be a viral illness involved.  He recently presented to [facility] emergency department on 01/15/2018 with complaints of fever and body aches.  He was found to be febrile at 102 with leukocytosis as well as hypotensive and anemic.  He was felt to be developing sepsis and transferred to [facility].  Cultures were obtained and he was placed on IV antibiotics and Tamiflu.  Blood cultures grew out staph epidermidis.  Echocardiogram was done in the work-up and this revealed the presence of a vegetation on the patient's aortic valve prosthesis.  Infectious disease consultation was obtained as well as T CTS consultation and it was felt he would require surgical intervention.   Discharged Condition: good  Hospital Course: Patient was medically stabilized and placed on intravenous antibiotics under the care of the infectious disease team.  Additionally cardiology consultation was obtained to assist with management.  Ultimately was determined that the patient should proceed with redo aortic valve replacement on 01/26/2018 he was taken the operating room where he underwent the below described procedure.  He tolerated it well was taken to the surgical intensive care unit in stable condition.  Postoperative hospital course:  The patient is done well postoperatively.  He did develop complete heart block which was monitored closely for several days but ultimately did require placement of a permanent pacemaker.  This was done on 01/31/2018 by Dr. Rayann Heman.  Otherwise he is remained quite hemodynamically  stable.  Routine lines, monitors and drainage devices have been discontinued in the standard fashion.  Oxygen has been weaned and he maintains good saturations on room air.  Incisions are noted to be healing well without evidence of infection.  He  did have some acute renal insufficiency but has improved over time.  Most recent BUN and creatinine are 13/1.7 respectively.  Sugars have been under excellent control.  He does have an expected acute blood loss anemia and values are stable.  Most recent hemoglobin hematocrit are 8.2 and 27.1 respectively.  At the time of discharge the patient is felt to be quite stable.  Consults: cardiology, ID and EP   Significant Diagnostic Studies: Chest CT angiogram, TEE  Treatments: surgery:  CARDIOTHORACIC SURGERY OPERATIVE NOTE  Date of Procedure:                01/26/2018  Preoperative Diagnosis:        Prosthetic Valve Endocarditis  S/P Aortic Valve Replacement using a Bioprosthetic Tissue Valve   Ascending Thoracic Aortic Aneurysm  Postoperative Diagnosis:    Same   Procedure:        Redo Aortic Valve Replacement             Redo Median Sternotomy             Human Allograft Aortic Root Replacement ([employer] aortic root graft, ID #[id number], size 25 mm)             Reimplantation of Left Main and Right Coronary Arteries             Resection and Grafting of Ascending Thoracic Aortic Aneurysm              Surgeon:        Valentina Gu. Roxy Manns, MD  Assistant:       John Giovanni, PA-C  Anesthesia:    Midge Minium, MD  Operative Findings:     Active prosthetic valve endocarditis with multiple large vegetations and annular abscess  Normal left ventricular systolic function    Discharge Exam: Blood pressure 137/79, pulse 82, temperature 97.8 F (36.6 C), temperature source Oral, resp. rate (!) 32, height _0  (1.803 m), weight 86.7 kg, SpO2 99 %.  General appearance: alert, cooperative and no distress Heart: regular rate and rhythm and no murmur or rub Lungs: clear to auscultation bilaterally Abdomen: benign Extremities: no edema Wound: incis healing well, pacer dressing CDI    Disposition: Discharge disposition: 01-Home or Self  Care       Discharge Instructions    Amb Referral to Cardiac Rehabilitation   Complete by:  As directed    Diagnosis:  Valve Replacement   Valve:  Aortic   Discharge patient   Complete by:  As directed    Afternoon after at least 4 hours after pacer wires d/c's. Also all Home health arrangements must be made fo IV abx   Discharge disposition:  01-Home or Self Care   Discharge patient date:  02/01/2018   Home infusion instructions [facility] May follow [place] Dosing Protocol; May administer Cathflo as needed to maintain patency of vascular access device.; Flushing of vascular access device: per [facility] Protocol: 0.9% NaCl pre/post medica...   Complete by:  As directed    Instructions:  May follow [place] Dosing Protocol   Instructions:  May administer Cathflo as needed to maintain patency of vascular access device.   Instructions:  Flushing of vascular access device: per  [facility] Protocol: 0.9% NaCl pre/post medication administration and prn patency; Heparin 100 u/ml, 57m for implanted ports and Heparin 10u/ml, 596mfor all other central venous catheters.   Instructions:  May follow [facility] Anaphylaxis Protocol for First Dose Administration in the home: 0.9% NaCl at 25-50 ml/hr to maintain IV access for protocol meds. Epinephrine 0.3 ml IV/IM PRN and Benadryl 25-50 IV/IM PRN s/s of anaphylaxis.   Instructions:  [facility] Coordinator (RN) to assist per patient IV care needs in the home PRN.     Allergies as of 02/01/2018   No Known Allergies     Medication List    STOP taking these medications   amoxicillin 500 MG tablet Commonly known as:  AMOXIL   celecoxib 200 MG capsule Commonly known as:  CELEBREX   triamcinolone cream 0.1 % Commonly known as:  KENALOG     TAKE these medications   aspirin 325 MG EC tablet Take 1 tablet (325 mg total) by mouth daily. Start taking on:  02/02/2018 What changed:    medication strength  how much to take    ceFAZolin  IVPB Commonly known as:  ANCEF Inject 2 g into the vein every 8 (eight) hours. Indication:  MSSE endocarditis Last Day of Therapy:  03/09/18 Labs - Once weekly:  CBC/D and CMP, Labs - Every other week:  ESR and CRP   Coenzyme Q10 300 MG Caps Take 1 capsule by mouth daily.   ferrous [id number]-folic acid capsule Commonly known as:  TRINSICON / FOLTRIN Take 1 capsule by mouth 3 (three) times daily after meals.   GLUCOSAMINE COMPLEX PO Take 1 capsule by mouth daily.   multivitamin with minerals Tabs tablet Take 1 tablet by mouth daily.   Omega 3 1000 MG Caps Take 3 capsules by mouth daily.   oxyCODONE 5 MG immediate release tablet Commonly known as:  Oxy IR/ROXICODONE Take 1-2 tablets (5-10 mg total) by mouth every 6 (six) hours as needed for up to 7 days for severe pain.   PROBIOTIC PO Take 1 capsule by mouth daily.   rifampin 300 MG capsule Commonly known as:  RIFADIN Take 1 capsule (300 mg total) by mouth 3 (three) times daily with meals.   TURMERIC PO Take 1 capsule by mouth daily.            Home Infusion Instuctions  (From admission, onward)         Start     Ordered   02/01/18 0000  Home infusion instructions [facility] May follow [facility] Protocol; May administer Cathflo as needed to maintain patency of vascular access device.; Flushing of vascular access device: [facility]: 0.9% NaCl pre/post medica...    Question Answer Comment  Instructions [place] Protocol   Instructions May administer Cathflo as needed to maintain patency of vascular access device.   Instructions Flushing of vascular access device: per [facility]: 0.9% NaCl pre/post medication administration and prn patency; Heparin 100 u/ml, 54m69mor implanted ports and Heparin 10u/ml, 54ml12mr all other central venous catheters.   Instructions May follow [facility] Anaphylaxis Protocol for First Dose Administration in the home: 0.9% NaCl  at 25-50 ml/hr to maintain IV access for protocol meds. Epinephrine 0.3 ml IV/IM PRN and Benadryl 25-50 IV/IM PRN s/s of anaphylaxis.   Instructions [facility] Infusion Coordinator (RN) to assist per patient IV care needs in the home PRN.      02/01/18 1132  Follow-up Information    Betances Callas, NP Follow up.   Specialty:  Infectious Diseases Why:  12/11 @ 2:30pm. If you are unable to make this appointment please call our office to reschedule.  Contact information: [place] [address] [phone]        [place] [facility] Follow up.   Specialty:  Cardiology Why:  02/10/18 @ 10:00AM, pacemaker wound check visit Contact information: [address], [place]       Thompson Grayer, MD Follow up.   Specialty:  Cardiology Why:  05/03/18 @ 10:15AM Contact information: [place] [address] [phone]        [name], MD Follow up.   Specialty:  Cardiology Why:  Please see discharge paperwork for 2-week follow-up appointment with cardiology. Contact information: [address] [address] [phone]        [name], MD Follow up.   Specialty:  Cardiothoracic Surgery Why:  Please see discharge paperwork for follow-up appointment with Dr. Roxy Manns.  Please obtain a chest x-ray at [facility] 1/2-hour prior to this appointment.  [facility] is located in the same office complex. Contact information: [address] [place] [address] [phone]          The patient has been discharged on:   1.Beta Blocker:  Yes [   ]                              No   [n   ]                              If No, reason: Postoperative heart block  2.Ace Inhibitor/ARB: Yes [   ]                                     No  [ n   ]                                     If No, reason: Acute  renal insufficiency and adequately controlled blood pressure 3.Statin:   Yes [   ]                  No  [ n  ]                  If No, reason: No coronary artery disease  4.Shela CommonsVelta Addison  Blue.Reese   ]                  No   [   ]                  If No, reason: Signed: Wilder Glade Ossiel Marchio PA-C 02/01/2018, 2:37 PM

## 2018-02-01 NOTE — Care Management Note (Signed)
Case Management Note Marvetta Gibbons RN, BSN Transitions of Care Unit 4E- RN Case Manager [phone]  Patient Details  Name: Patrick Walter MRN: [id number] Date of Birth: 08-May-1955  Subjective/Objective:   Pt tx from [facility] to [facility] for further treatment of sepsis secondary to methicillin susceptible Staphylococcus epidermidis/prosthetic valve endocarditis, TEE showed large vegetation on the prosthetic aortic valve CVTS consulted and plan for redo valve replacement on 11/7               Action/Plan: PTA pt lived at home with spouse, independent- anticipate return home, CM to follow for transition of care needs post op.   Expected Discharge Date:  02/01/18               Expected Discharge Plan:  [place]  In-House Referral:  NA  Discharge planning Services  CM Consult  Post Acute Care Choice:  Home Health Choice offered to:  Patient, Spouse  DME Arranged:    DME Agency:     HH Arranged:  RN, IV Antibiotics HH Agency:  [facility] (formerly [facility])  Status of Service:  Completed, signed off  If discussed at [employer] of Stay Meetings, dates discussed:    Discharge Disposition: home/home health   Additional Comments:  02/01/18- 1000- Marvetta Gibbons RN, CM- pt for transition home today, PICC in place, s/p PPM yesterday. Pt will need IV abx through Dec. 19. CM spoke with pt at bedside who states that he and his wife would prefer not to use [facility] however pt request that CM speak with his wife regarding transition plans and choice- Wife to be back around noon made plans to meet back around 1230- returned to room at meeting time however wife not at bedside and pt asked CM to call wife- [name] at [phone]- TC made to wife to discuss transition needs for [facility] and home IV abx. Discussed choice for [facility] agency- and concerns regarding "referal" to [facility] made per ID for pharmacy needs. Wife has chosen [facility] for [facility] and states  she is agreeable to [facility] providing the pharmacy needs , coordinating care with [facility]. No other needs identified with wife- who plans to come around 430pm to transport pt home. Referral called to [place] with [facility] for [facility] needs to coordinate with [facility] pharmacy for IV abx- have also spoken with Pam at [facility] regarding transition of care plans. CM updated pt at bedside on wife's decision on home care- [facility] pharmacy has delivered meds to the bedside. CM has provided pt with contact name and # for Care Coordination VP as a contact person should he want to express any of his concerns about referrals made by ID and pt choice.    Dawayne Patricia, RN 02/01/2018, 2:55 PM

## 2018-02-01 NOTE — Progress Notes (Addendum)
[address]       ,[place] [address]             [phone]      1 Day Post-Op Procedure(s) (LRB): PACEMAKER IMPLANT (N/A) Subjective: Feels moderate soreness at pacemaker insertion site. Otherwise he has no specific C/O  Objective: Vital signs in last 24 hours: Temp:  [97.5 F (36.4 C)-98.7 F (37.1 C)] 97.8 F (36.6 C) (11/13 0802) Pulse Rate:  [0-181] 76 (11/13 0802) Cardiac Rhythm: Ventricular paced (11/13 0700) Resp:  [0-40] 15 (11/13 0802) BP: (116-155)/(72-94) 141/91 (11/13 0802) SpO2:  [91 %-100 %] 99 % (11/13 0802) Weight:  [86.7 kg] 86.7 kg (11/13 0406)  Hemodynamic parameters for last 24 hours:    Intake/Output from previous day: 11/12 0701 - 11/13 0700 In: 1561.9 [P.O.:840; I.V.:121.9; IV Piggyback:600] Out: 1425 [Urine:1425] Intake/Output this shift: Total I/O In: -  Out: 200 [Urine:200]  General appearance: alert, cooperative and no distress Heart: regular rate and rhythm and no murmur or rub Lungs: clear to auscultation bilaterally Abdomen: benign Extremities: no edema Wound: incis healing well, pacer dressing CDI  Lab Results: Recent Labs    01/30/18 0308  WBC 11.6*  HGB 8.2*  HCT 27.1*  PLT 347   BMET:  Recent Labs    01/31/18 0252 02/01/18 0357  NA 141 139  K 4.1 3.6  CL 110 109  CO2 27 25  GLUCOSE 109* 112*  BUN 18 13  CREATININE 1.56* 1.27*  CALCIUM 9.3 9.1    PT/INR: No results for input(s): LABPROT, INR in the last 72 hours. ABG    Component Value Date/Time   PHART 7.386 01/26/2018 2228   HCO3 22.5 01/26/2018 2228   TCO2 25 01/27/2018 1623   ACIDBASEDEF 2.0 01/26/2018 2228   O2SAT 97.0 01/26/2018 2228   CBG (last 3)  No results for input(s): GLUCAP in the last 72 hours.  Meds Scheduled Meds: . aspirin EC  325 mg Oral Daily  . bisacodyl  10 mg Oral Daily  . Chlorhexidine Gluconate Cloth  6 each Topical Daily  . docusate sodium  200 mg Oral Daily  . ferrous [id number] C-folic  acid  1 capsule Oral TID PC  . pantoprazole  40 mg Oral Daily  . rifampin  300 mg Oral TID WC  . sodium chloride flush  10-40 mL Intracatheter Q12H  . sodium chloride flush  10-40 mL Intracatheter Q12H  . sodium chloride flush  3 mL Intravenous Q12H  . sodium chloride flush  3 mL Intravenous Q12H   Continuous Infusions: . sodium chloride    . sodium chloride    .  ceFAZolin (ANCEF) IV 1 g (02/01/18 0442)  . nafcillin IV 2 g (02/01/18 0639)   PRN Meds:.sodium chloride, acetaminophen, fentaNYL (SUBLIMAZE) injection, ondansetron (ZOFRAN) IV, oxyCODONE, sodium chloride flush, sodium chloride flush, sodium chloride flush, sodium chloride flush, traMADol  Xrays Dg Chest Port 1 View  Result Date: 01/30/2018 CLINICAL DATA:  PICC line placement EXAM: PORTABLE CHEST 1 VIEW COMPARISON:  01/30/2018, 01/29/2018, 01/27/2018 FINDINGS: Post sternotomy changes. Left upper extremity catheter tip projects over the proximal right atrium. Cardiomegaly. Hazy atelectasis at the left base. No pneumothorax. IMPRESSION: 1. Left upper extremity catheter tip faintly visible over the proximal right atrium. 2. Cardiomegaly with hazy atelectasis at the left base. Electronically Signed   By: Donavan Foil M.D.   On: 01/30/2018 22:43   [place] Ekg Site Rite  Result Date: 01/30/2018 If [employer]  not attached, placement could not be confirmed due to current cardiac rhythm.   Assessment/Plan: S/P Procedure(s) (LRB): PACEMAKER IMPLANT (N/A)  1 doing well, s/p perm pacer, interrogated this am 2 hemodyn stable with some systolic htn at times- mostly well controlled- on no preop meds for HTN- follow 3 sats good on RA 4 BUN /Creat cont to improve 13/1.27, GFR 60 5 BS well controlled 6 HH to do IV abx at discharge 7 poss home today if all agree   LOS: 17 days    John Giovanni PA-C 02/01/2018 Pager [phone]  I have seen and examined the patient and agree with the assessment and plan as outlined.  D/C pacing  wires and D/C home later this afternoon.  [name], MD 02/01/2018 9:35 AM

## 2018-02-01 NOTE — Progress Notes (Signed)
Device interrogation reviewed and normal CXR reveals stable leads, no ptx  Thompson Grayer MD, [facility] 02/01/2018 8:11 AM

## 2018-02-01 NOTE — Progress Notes (Addendum)
Progress Note   Subjective   Doing well today, the patient denies CP or SOB.  Minimal pacer site discomfort  Inpatient Medications    Scheduled Meds: . aspirin EC  325 mg Oral Daily  . bisacodyl  10 mg Oral Daily  . Chlorhexidine Gluconate Cloth  6 each Topical Daily  . docusate sodium  200 mg Oral Daily  . ferrous [id number] C-folic acid  1 capsule Oral TID PC  . pantoprazole  40 mg Oral Daily  . rifampin  300 mg Oral TID WC  . sodium chloride flush  10-40 mL Intracatheter Q12H  . sodium chloride flush  10-40 mL Intracatheter Q12H  . sodium chloride flush  3 mL Intravenous Q12H  . sodium chloride flush  3 mL Intravenous Q12H   Continuous Infusions: . sodium chloride    . sodium chloride    .  ceFAZolin (ANCEF) IV 1 g (02/01/18 0442)  . nafcillin IV 2 g (02/01/18 0639)   PRN Meds: sodium chloride, acetaminophen, fentaNYL (SUBLIMAZE) injection, ondansetron (ZOFRAN) IV, oxyCODONE, sodium chloride flush, sodium chloride flush, sodium chloride flush, sodium chloride flush, traMADol   Vital Signs    Vitals:   02/01/18 0300 02/01/18 0353 02/01/18 0406 02/01/18 0802  BP: 137/89   (!) 141/91  Pulse:    76  Resp: 11   15  Temp:  97.7 F (36.5 C)  97.8 F (36.6 C)  TempSrc:  Oral  Oral  SpO2: 97%   99%  Weight:   86.7 kg   Height:        Intake/Output Summary (Last 24 hours) at 02/01/2018 0850 Last data filed at 02/01/2018 0802 Gross per 24 hour  Intake 1201.87 ml  Output 1625 ml  Net -423.13 ml   Filed Weights   01/29/18 0500 01/31/18 0351 02/01/18 0406  Weight: 79.2 kg 80.2 kg 86.7 kg    Telemetry    SR, V pacing - Personally Reviewed  Physical Exam    GEN- The patient is well appearing, alert and oriented x 3 today.   Head- normocephalic, atraumatic Eyes-  Sclera clear, conjunctiva pink Ears- hearing intact Oropharynx- clear Neck- supple, Lungs- CTA b/l, normal work of breathing Heart- RRR, no murmurs, rubs or gallops  GI- soft, NT,  ND Extremities- no clubbing, cyanosis, or edema  MS- no significant deformity or atrophy Skin- no rash or lesion Psych- euthymic mood, full affect Neuro- strength and sensation are intact  PPM site, bulky dressing removed, tegaderm dressing is dry, no hematoma   Labs    Chemistry Recent Labs  Lab 01/29/18 0450  01/30/18 0308 01/31/18 0252 02/01/18 0357  NA 140   < > 140 141 139  K 3.5   < > 3.4* 4.1 3.6  CL 107   < > 107 110 109  CO2 26   < > 29 27 25   GLUCOSE 94   < > 102* 109* 112*  BUN 17   < > 15 18 13   CREATININE 1.50*   < > 1.42* 1.56* 1.27*  CALCIUM 9.2   < > 9.1 9.3 9.1  PROT 4.9*  --  4.6*  --  4.9*  ALBUMIN 2.1*  --  2.0*  --  2.0*  AST 18  --  21  --  52*  ALT 12  --  13  --  35  ALKPHOS 51  --  64  --  107  BILITOT 1.6*  --  1.7*  --  1.0  GFRNONAA 49*   < >  52* 47* 60*  GFRAA 57*   < > >60 54* >60  ANIONGAP 7   < > 4* 4* 5   < > = values in this interval not displayed.     Hematology Recent Labs  Lab 01/28/18 0741 01/29/18 0450 01/30/18 0308  WBC 15.1* 13.8* 11.6*  RBC 3.07* 3.07* 3.07*  HGB 8.4* 8.4* 8.2*  HCT 27.1* 27.3* 27.1*  MCV 88.3 88.9 88.3  MCH 27.4 27.4 26.7  MCHC 31.0 30.8 30.3  RDW 14.2 14.5 14.6  PLT 193 180 347    Cardiac EnzymesNo results for input(s): TROPONINI in the last 168 hours. No results for input(s): TROPIPOC in the last 168 hours.      Assessment & Plan    1.  Complete heart block post AV surgery      Now s/p PPM implant yesterday with Dr. Rayann Heman      Device check this AM with intact function      CXR with stable lead positioning, no ptx       Wound care and L arm/activity restrictions were discussed with the patient      Routine post PPM follow up is in place  Please remove Tegaderm dressing day of discharge, steri-strips underneath remain in place to out patient wound check visit   Continue care with CTS service/primary team EP service will sign off though remain available Please recall if  needed    Tommye Standard, PA-C 02/01/2018 8:50 AM   I have seen, examined the patient, and reviewed the above assessment and plan.  Changes to above are made where necessary.  On exam, RRR.  Doing well s/p PPM.  Device interrogation is normal (personally reviewed).  CXR reveals stable leads, no ptx.  Routine wound care and follow-up.  Electrophysiology team to see as needed while here. Please call with questions.   Co Sign: Thompson Grayer, MD 02/02/2018 7:33 AM

## 2018-02-01 NOTE — Progress Notes (Signed)
Removed patient pacer wires per provider order. Patient tolerated well. Vital signs are stable. 134/84, 82HR, 24RR. Patient will be on bedrest for 1 hour.  Removed chest tube sutures without complication. Cleansed with providone and placed gauze over area.  Will continue to monitor patient.

## 2018-02-01 NOTE — Progress Notes (Signed)
Patrick Walter to be D/C'd Home per MD order. Discussed with the patient and all questions fully answered.    IAn After Visit Summary was printed and given to the patient.  Patient escorted via [place], and D/C home via private auto.  Cyndra Numbers  02/01/2018 7:20 PM

## 2018-02-01 NOTE — Progress Notes (Signed)
PHARMACY CONSULT NOTE FOR:  OUTPATIENT  PARENTERAL ANTIBIOTIC THERAPY (OPAT)  Indication: MSSE prosthetic valve endocarditis  Regimen: Cefazolin 2 gm every 8 hours  + Rifampin 300 mg TID w/ meals  End date: 03/09/18  IV antibiotic discharge orders are pended. To discharging provider:  please sign these orders via discharge navigator,  Select New Orders & click on the button choice - Manage This Unsigned Work.    Patient family prefers for patient to not be on a pump at home. Spoke with Dr. Linus Salmons and will change patient to cefazolin for discharge to allow for IV push dosing with home antibiotics.     Thank you for allowing pharmacy to be a part of this patient's care.  Susa Raring, PharmD, BCPS Infectious Diseases Clinical Pharmacist Phone: [phone] 02/01/2018, 11:32 AM

## 2018-02-01 NOTE — Discharge Instructions (Signed)
Aortic Valve Replacement, Care After °Refer to this sheet in the next few weeks. These instructions provide you with information about caring for yourself after your procedure. Your health care provider may also give you more specific instructions. Your treatment has been planned according to current medical practices, but problems sometimes occur. Call your health care provider if you have any problems or questions after your procedure. °What can I expect after the procedure? °After the procedure, it is common to have: °· Pain around your incision area. °· A small amount of blood or clear fluid coming from your incision. ° °Follow these instructions at home: °Eating and drinking ° °· Follow instructions from your health care provider about eating or drinking restrictions. °? Limit alcohol intake to no more than 1 drink per day for nonpregnant women and 2 drinks per day for men. One drink equals 12 oz of beer, 5 oz of wine, or 1½ oz of hard liquor. °? Limit how much caffeine you drink. Caffeine can affect your heart's rate and rhythm. °· Drink enough fluid to keep your urine clear or pale yellow. °· Eat a heart-healthy diet. This should include plenty of fresh fruits and vegetables. If you eat meat, it should be lean cuts. Avoid foods that are: °? High in salt, saturated fat, or sugar. °? Canned or highly processed. °? Fried. °Activity °· Return to your normal activities as told by your health care provider. Ask your health care provider what activities are safe for you. °· Exercise regularly once you have recovered, as told by your health care provider. °· Avoid sitting for more than 2 hours at a time without moving. Get up and move around at least once every 1-2 hours. This helps to prevent blood clots in the legs. °· Do not lift anything that is heavier than 10 lb (4.5 kg) until your health care provider approves. °· Avoid pushing or pulling things with your arms until your health care provider approves. This  includes pulling on handrails to help you climb stairs. °Incision care ° °· Follow instructions from your health care provider about how to take care of your incision. Make sure you: °? Wash your hands with soap and water before you change your bandage (dressing). If soap and water are not available, use hand sanitizer. °? Change your dressing as told by your health care provider. °? Leave stitches (sutures), skin glue, or adhesive strips in place. These skin closures may need to stay in place for 2 weeks or longer. If adhesive strip edges start to loosen and curl up, you may trim the loose edges. Do not remove adhesive strips completely unless your health care provider tells you to do that. °· Check your incision area every day for signs of infection. Check for: °? More redness, swelling, or pain. °? More fluid or blood. °? Warmth. °? Pus or a bad smell. °Medicines °· Take over-the-counter and prescription medicines only as told by your health care provider. °· If you were prescribed an antibiotic medicine, take it as told by your health care provider. Do not stop taking the antibiotic even if you start to feel better. °Travel °· Avoid airplane travel for as long as told by your health care provider. °· When you travel, bring a list of your medicines and a record of your medical history with you. Carry your medicines with you. °Driving °· Ask your health care provider when it is safe for you to drive. Do not drive until your health   care provider approves.  Do not drive or operate heavy machinery while taking prescription pain medicine. Lifestyle   Do not use any tobacco products, such as cigarettes, chewing tobacco, or e-cigarettes. If you need help quitting, ask your health care provider.  Resume sexual activity as told by your health care provider. Do not use medicines for erectile dysfunction unless your health care provider approves, if this applies.  Work with your health care provider to keep your  blood pressure and cholesterol under control, and to manage any other heart conditions that you have.  Maintain a healthy weight. General instructions  Do not take baths, swim, or use a hot tub until your health care provider approves.  Do not strain to have a bowel movement.  Avoid crossing your legs while sitting down.  Check your temperature every day for a fever. A fever may be a sign of infection.  If you are a woman and you plan to become pregnant, talk with your health care provider before you become pregnant.  Wear compression stockings if your health care provider instructs you to do this. These stockings help to prevent blood clots and reduce swelling in your legs.  Tell all health care providers who care for you that you have an artificial (prosthetic) aortic valve. If you have or have had heart disease or endocarditis, tell all health care providers about these conditions as well.  Keep all follow-up visits as told by your health care provider. This is important. Contact a health care provider if:  You develop a skin rash.  You experience sudden, unexplained changes in your weight.  You have more redness, swelling, or pain around your incision.  You have more fluid or blood coming from your incision.  Your incision feels warm to the touch.  You have pus or a bad smell coming from your incision.  You have a fever. Get help right away if:  You develop chest pain that is different from the pain coming from your incision.  You develop shortness of breath or difficulty breathing.  You start to feel light-headed. These symptoms may represent a serious problem that is an emergency. Do not wait to see if the symptoms will go away. Get medical help right away. Call your local emergency services (911 in the [place] Do not drive yourself to the hospital. This information is not intended to replace advice given to you by your health care provider. Make sure you discuss any  questions you have with your health care provider. Document Released: 09/24/2004 Document Revised: 08/14/2015 Document Reviewed: 02/09/2015 Elsevier Interactive Patient Education  2017 [place] Discharge Instructions for  Pacemaker/Defibrillator Patients  Activity No heavy lifting or vigorous activity with your left/right arm for 6 to 8 weeks.  Do not raise your left/right arm above your head for one week.  Gradually raise your affected arm as drawn below.              02/04/18                   02/05/18                 02/06/18                 02/07/18 __  NO DRIVING until cleared to by surgeon.  WOUND CARE - Keep the wound area clean and dry.  Do not get this area wet, no showers until your wound check visit. -  The tape/steri-strips on your wound will fall off; do not pull them off.  No bandage is needed on the site.  DO  NOT apply any creams, oils, or ointments to the wound area. - If you notice any drainage or discharge from the wound, any swelling or bruising at the site, or you develop a fever > 101? F after you are discharged home, call the office at once.  Special Instructions - You are still able to use cellular telephones; use the ear opposite the side where you have your pacemaker/defibrillator.  Avoid carrying your cellular phone near your device. - When traveling through airports, show security personnel your identification card to avoid being screened in the metal detectors.  Ask the security personnel to use the hand wand. - Avoid arc welding equipment, MRI testing (magnetic resonance imaging), TENS units (transcutaneous nerve stimulators).  Call the office for questions about other devices. - Avoid electrical appliances that are in poor condition or are not properly grounded. - Microwave ovens are safe to be near or to operate.

## 2018-02-01 NOTE — Progress Notes (Signed)
CARDIAC REHAB PHASE I   PRE:  Rate/Rhythm: 93 pacing    BP: sitting 147/89    SaO2: 98 RA  MODE:  Ambulation: 1060 ft   POST:  Rate/Rhythm: 113 pacing    BP: sitting 136/82     SaO2: 98 RA  Pt tolerated well, no c/o. Ed completed with pt and daughter. Good reception. Will refer to CRPII g'so however he did not do after first surgery. He will think about it.  [place], ACSM 02/01/2018 11:31 AM

## 2018-02-02 ENCOUNTER — Telehealth (HOSPITAL_COMMUNITY): Payer: Self-pay

## 2018-02-02 NOTE — Telephone Encounter (Signed)
Pt insurance is active and benefits verified through [facility]. Co-pay $0.00, DED $0.00/$0.00 met, out of pocket $3,500.00/$226.64 met, co-insurance 20%. No pre-authorization requried. Passport, 02/02/18 @ 11:26AM, REF# [id number]  Will contact patient to see if he is interested in the Cardiac Rehab Program. If interested, patient will need to complete follow up appt. Once completed, patient will be contacted for scheduling upon review by the RN Navigator.

## 2018-02-02 NOTE — Telephone Encounter (Signed)
Attempted to call patient in regards to Cardiac Rehab - LM on VM 

## 2018-02-02 NOTE — Telephone Encounter (Signed)
Patient returned CR phone call and stated he will be exercising at home.  Closed referral

## 2018-02-03 ENCOUNTER — Encounter: Payer: Self-pay | Admitting: Thoracic Surgery (Cardiothoracic Vascular Surgery)

## 2018-02-06 DIAGNOSIS — T826XXA Infection and inflammatory reaction due to cardiac valve prosthesis, initial encounter: Secondary | ICD-10-CM | POA: Diagnosis not present

## 2018-02-06 DIAGNOSIS — Z48812 Encounter for surgical aftercare following surgery on the circulatory system: Secondary | ICD-10-CM | POA: Diagnosis not present

## 2018-02-06 DIAGNOSIS — A4101 Sepsis due to Methicillin susceptible Staphylococcus aureus: Secondary | ICD-10-CM | POA: Diagnosis not present

## 2018-02-08 ENCOUNTER — Encounter: Payer: Self-pay | Admitting: Sports Medicine

## 2018-02-08 DIAGNOSIS — M7989 Other specified soft tissue disorders: Secondary | ICD-10-CM

## 2018-02-08 DIAGNOSIS — R7881 Bacteremia: Secondary | ICD-10-CM | POA: Diagnosis not present

## 2018-02-08 DIAGNOSIS — I38 Endocarditis, valve unspecified: Secondary | ICD-10-CM | POA: Diagnosis not present

## 2018-02-08 DIAGNOSIS — A4101 Sepsis due to Methicillin susceptible Staphylococcus aureus: Secondary | ICD-10-CM | POA: Diagnosis not present

## 2018-02-09 DIAGNOSIS — M7989 Other specified soft tissue disorders: Secondary | ICD-10-CM | POA: Insufficient documentation

## 2018-02-09 MED ORDER — ALLOPURINOL 300 MG PO TABS: 300.0000 mg | ORAL_TABLET | Freq: Every day | ORAL | 3 refills | Status: DC

## 2018-02-09 NOTE — Assessment & Plan Note (Signed)
Patient suspects gout, MTP swelling, he was not specific regarding which side. Adding a uric acid levels, allopurinol. He is post median sternotomy for aortic valve repair for endocarditis and aneurysm repair.

## 2018-02-10 DIAGNOSIS — M7989 Other specified soft tissue disorders: Secondary | ICD-10-CM | POA: Diagnosis not present

## 2018-02-10 LAB — COMPREHENSIVE METABOLIC PANEL WITH GFR
AG Ratio: 1.2 (calc) (ref 1.0–2.5)
Albumin: 3.6 g/dL (ref 3.6–5.1)
CO2: 27 mmol/L (ref 20–32)
Calcium: 10 mg/dL (ref 8.6–10.3)
Chloride: 104 mmol/L (ref 98–110)
Globulin: 2.9 g/dL (ref 1.9–3.7)
Glucose, Bld: 117 mg/dL — ABNORMAL HIGH (ref 65–99)
Potassium: 4.4 mmol/L (ref 3.5–5.3)
Sodium: 139 mmol/L (ref 135–146)
Total Bilirubin: 0.4 mg/dL (ref 0.2–1.2)
Total Protein: 6.5 g/dL (ref 6.1–8.1)

## 2018-02-10 LAB — COMPREHENSIVE METABOLIC PANEL
ALT: 10 U/L (ref 9–46)
AST: 19 U/L (ref 10–35)
Alkaline phosphatase (APISO): 138 U/L — ABNORMAL HIGH (ref 40–115)
BUN: 15 mg/dL (ref 7–25)
Creat: 0.91 mg/dL (ref 0.70–1.25)

## 2018-02-10 LAB — URIC ACID: Uric Acid, Serum: 5.6 mg/dL (ref 4.0–8.0)

## 2018-02-10 LAB — CBC WITH DIFFERENTIAL/PLATELET
Basophils Absolute: 133 cells/uL (ref 0–200)
Basophils Relative: 1.4 %
Eosinophils Absolute: 694 cells/uL — ABNORMAL HIGH (ref 15–500)
Eosinophils Relative: 7.3 %
HCT: 28 % — ABNORMAL LOW (ref 38.5–50.0)
Hemoglobin: 9 g/dL — ABNORMAL LOW (ref 13.2–17.1)
Lymphs Abs: 1767 {cells}/uL (ref 850–3900)
MCH: 27.4 pg (ref 27.0–33.0)
MCHC: 32.1 g/dL (ref 32.0–36.0)
MCV: 85.4 fL (ref 80.0–100.0)
MPV: 10.8 fL (ref 7.5–12.5)
Monocytes Relative: 10.3 %
Neutro Abs: 5928 {cells}/uL (ref 1500–7800)
Neutrophils Relative %: 62.4 %
Platelets: 522 10*3/uL — ABNORMAL HIGH (ref 140–400)
RBC: 3.28 Million/uL — ABNORMAL LOW (ref 4.20–5.80)
RDW: 14.6 % (ref 11.0–15.0)
Total Lymphocyte: 18.6 %
WBC mixed population: 979 cells/uL — ABNORMAL HIGH (ref 200–950)
WBC: 9.5 10*3/uL (ref 3.8–10.8)

## 2018-02-13 ENCOUNTER — Encounter: Payer: Self-pay | Admitting: Infectious Diseases

## 2018-02-13 ENCOUNTER — Ambulatory Visit (INDEPENDENT_AMBULATORY_CARE_PROVIDER_SITE_OTHER): Payer: 59 | Admitting: *Deleted

## 2018-02-13 DIAGNOSIS — I442 Atrioventricular block, complete: Secondary | ICD-10-CM

## 2018-02-13 DIAGNOSIS — Z48812 Encounter for surgical aftercare following surgery on the circulatory system: Secondary | ICD-10-CM | POA: Diagnosis not present

## 2018-02-13 DIAGNOSIS — T826XXA Infection and inflammatory reaction due to cardiac valve prosthesis, initial encounter: Secondary | ICD-10-CM | POA: Diagnosis not present

## 2018-02-13 DIAGNOSIS — R7881 Bacteremia: Secondary | ICD-10-CM | POA: Diagnosis not present

## 2018-02-13 DIAGNOSIS — Z950 Presence of cardiac pacemaker: Secondary | ICD-10-CM

## 2018-02-13 DIAGNOSIS — I38 Endocarditis, valve unspecified: Secondary | ICD-10-CM | POA: Diagnosis not present

## 2018-02-13 DIAGNOSIS — A4101 Sepsis due to Methicillin susceptible Staphylococcus aureus: Secondary | ICD-10-CM | POA: Diagnosis not present

## 2018-02-13 LAB — CUP PACEART INCLINIC DEVICE CHECK
Battery Remaining Longevity: 132 mo
Date Time Interrogation Session: 20191125113209
Implantable Lead Implant Date: 20191112
Implantable Lead Location: 753859
Implantable Lead Location: 753860
Implantable Pulse Generator Implant Date: 20191112
Lead Channel Pacing Threshold Amplitude: 0.5 V
Lead Channel Pacing Threshold Amplitude: 0.5 V
Lead Channel Pacing Threshold Pulse Width: 0.5 ms
Lead Channel Sensing Intrinsic Amplitude: 4.6 mV
Lead Channel Setting Pacing Amplitude: 0.75 V
Lead Channel Setting Pacing Amplitude: 1.5 V
Lead Channel Setting Pacing Pulse Width: 0.5 ms
MDC IDC LEAD IMPLANT DT: 20191112
MDC IDC MSMT BATTERY VOLTAGE: 3.11 V
MDC IDC MSMT LEADCHNL RA IMPEDANCE VALUE: 475 Ohm
MDC IDC MSMT LEADCHNL RA PACING THRESHOLD PULSEWIDTH: 0.5 ms
MDC IDC MSMT LEADCHNL RA SENSING INTR AMPL: 2.4 mV
MDC IDC MSMT LEADCHNL RV IMPEDANCE VALUE: 587.5 Ohm
MDC IDC PG SERIAL: 9086757
MDC IDC SET LEADCHNL RV SENSING SENSITIVITY: 3.5 mV
MDC IDC STAT BRADY RA PERCENT PACED: 0.17 %
MDC IDC STAT BRADY RV PERCENT PACED: 99.7 %
Pulse Gen Model: 2272

## 2018-02-13 NOTE — Progress Notes (Signed)
Wound check appointment. Steri-strips removed. Wound without redness or edema. Incision edges fully approximated, no drainage noted, scab in place at left lateral corner of incision, wound healing progressing. Patient educated about signs/symptoms of infection and agrees to call if any noted. Site assessed by JA--no additional interventions recommended at this time, plan for wound recheck on 02/21/18 during appointment with Richardson Dopp, PA.  Normal device function. Thresholds, sensing, and impedances consistent with implant measurements. RV sensitivity reprogrammed from 5.28mV to 3.24mV per JA as R-waves measure 4.70mV today. Auto capture programmed on for extra safety margin until 3 month visit. Histogram distribution appropriate for patient and level of activity. No mode switches or high ventricular rates noted. Patient educated about wound care, arm mobility, lifting restrictions, and Merlin monitor. ROV with DC on 02/21/18 and ROV with JA on 05/03/18.

## 2018-02-20 DIAGNOSIS — Z48812 Encounter for surgical aftercare following surgery on the circulatory system: Secondary | ICD-10-CM | POA: Diagnosis not present

## 2018-02-20 DIAGNOSIS — R7881 Bacteremia: Secondary | ICD-10-CM | POA: Diagnosis not present

## 2018-02-20 DIAGNOSIS — T826XXA Infection and inflammatory reaction due to cardiac valve prosthesis, initial encounter: Secondary | ICD-10-CM | POA: Diagnosis not present

## 2018-02-20 DIAGNOSIS — A4901 Methicillin susceptible Staphylococcus aureus infection, unspecified site: Secondary | ICD-10-CM | POA: Diagnosis not present

## 2018-02-20 DIAGNOSIS — A4101 Sepsis due to Methicillin susceptible Staphylococcus aureus: Secondary | ICD-10-CM | POA: Diagnosis not present

## 2018-02-21 ENCOUNTER — Other Ambulatory Visit: Payer: Self-pay | Admitting: Medical

## 2018-02-21 ENCOUNTER — Encounter: Payer: Self-pay | Admitting: Physician Assistant

## 2018-02-21 ENCOUNTER — Ambulatory Visit (INDEPENDENT_AMBULATORY_CARE_PROVIDER_SITE_OTHER): Payer: 59 | Admitting: Medical

## 2018-02-21 ENCOUNTER — Ambulatory Visit (INDEPENDENT_AMBULATORY_CARE_PROVIDER_SITE_OTHER): Payer: 59 | Admitting: *Deleted

## 2018-02-21 VITALS — BP 130/90 | HR 82 | Ht 71.0 in | Wt 180.8 lb

## 2018-02-21 DIAGNOSIS — T826XXD Infection and inflammatory reaction due to cardiac valve prosthesis, subsequent encounter: Secondary | ICD-10-CM

## 2018-02-21 DIAGNOSIS — Z950 Presence of cardiac pacemaker: Secondary | ICD-10-CM | POA: Diagnosis not present

## 2018-02-21 DIAGNOSIS — I7789 Other specified disorders of arteries and arterioles: Secondary | ICD-10-CM | POA: Diagnosis not present

## 2018-02-21 DIAGNOSIS — I38 Endocarditis, valve unspecified: Secondary | ICD-10-CM

## 2018-02-21 DIAGNOSIS — I442 Atrioventricular block, complete: Secondary | ICD-10-CM | POA: Diagnosis not present

## 2018-02-21 DIAGNOSIS — Z953 Presence of xenogenic heart valve: Secondary | ICD-10-CM

## 2018-02-21 NOTE — Progress Notes (Signed)
Wound re-check appointment. Wound without redness or edema. Incision edges fully approximated, no drainage noted, scab in place at right lateral corner of incision, wound healing progressing. Patient educated about signs/symptoms of infection and agrees to call if any noted. ROV with JA on 05/03/18.

## 2018-02-21 NOTE — Patient Instructions (Signed)
Medication Instructions:  Your physician recommends that you continue on your current medications as directed. Please refer to the Current Medication list given to you today.  If you need a refill on your cardiac medications before your next appointment, please call your pharmacy.   Lab work: NONE If you have labs (blood work) drawn today and your tests are completely normal, you will receive your results only by: . MyChart Message (if you have MyChart) OR . A paper copy in the mail If you have any lab test that is abnormal or we need to change your treatment, we will call you to review the results.  Testing/Procedures: NONE  Follow-Up: At [facility], you and your health needs are our priority.  As part of our continuing mission to provide you with exceptional heart care, we have created designated Provider Care Teams.  These Care Teams include your primary Cardiologist (physician) and Advanced Practice Providers (APPs -  Physician Assistants and Nurse Practitioners) who all work together to provide you with the care you need, when you need it. You will need a follow up appointment in:  6 months.  Please call our office 2 months in advance to schedule this appointment.  You may see Michael Cooper, MD or one of the following Advanced Practice Providers on your designated Care Team: Scott Weaver, PA-C Vin Bhagat, PA-C . Janine Hammond, NP  Any Other Special Instructions Will Be Listed Below (If Applicable).    

## 2018-02-21 NOTE — Progress Notes (Signed)
Cardiology Office Note   Date:  02/21/2018   ID:  Dalante Minus, DOB November 08, 1955, MRN [id number]  PCP:  [name], MD  Cardiologist:  [name], MD EP: None  Chief Complaint  Patient presents with  . Hospitalization Follow-up    aortic valve replacement      History of Present Illness: Jaysion Ramseyer is a 62 y.o. male with a PMH of aortic valve replacement in 2015 with recent redo 01/26/2018 2/2 prosthetic valve endocarditis, with post-op course complicated by CHB requiring PPM placement 01/31/18, HTN, who presents for post-hospital follow-up of aortic valve and PPM.  He was admitted to the hospital from 01/15/18-02/01/18 with sepsis, found to have staph epidermis bacteremia and prosthetic aortic valve endocarditis. He underwent redo aortic valve replacement 01/26/18 and tolerated the procedure well. His post-op course was complicated by CHB and he underwent PPM placement 01/31/18. He was discharged home on rifampin and cefazolin to complete a 6 weeks course (end date 03/09/18).  Since discharge from the hospital he reports doing well from a cardiac standpoint. He is back to walking 2-3 miles per day or riding a bike for 30 minutes a day. He denies any chest pain, SOB, or DOE. PPM site/sternal incision are healing well. He denies LE edema. He is tolerating his antibiotics without complications. He is hopeful to get back to his active lifestyle and run the [facility] in 2021 or 2022.    Past Medical History:  Diagnosis Date  . Aortic stenosis   . Ascending aorta enlargement ([place])   . Bicuspid aortic valve   . Heart murmur   . Hypertension   . Patent foramen ovale    Closed at the time of aortic valve replacement  . Pilonidal cyst   . S/P aortic valve replacement with bioprosthetic valve 12/05/2013   25 mm [facility] Ease bovine pericardial tissue valve   . S/P redo aortic valve replacement with human allograft aortic root graft 01/26/2018  . Septic  embolism (HCC)    Brain, spleen, superior mesenteric artery    Past Surgical History:  Procedure Laterality Date  . AORTIC VALVE REPLACEMENT N/A 12/05/2013   Procedure: AORTIC VALVE REPLACEMENT (AVR);  Surgeon: [name], MD;  Location: [place];  Service: Open Heart Surgery;  Laterality: N/A;  . AORTIC VALVE REPLACEMENT N/A 01/26/2018   Procedure: REDO STERNOTOMY, REDO AORTIC VALVE REPLACEMENT WITH HUMAN ALLOGRAFT INCLUDING AORTIC ROOT REPLACEMENT,  REPAIR ASCENDING THORACIC AORTIC ANEURYSM;  Surgeon: [name], MD;  Location: [place];  Service: Open Heart Surgery;  Laterality: N/A;  . HAND SURGERY Left 07/1977  . INTRAOPERATIVE TRANSESOPHAGEAL ECHOCARDIOGRAM N/A 12/05/2013   Procedure: INTRAOPERATIVE TRANSESOPHAGEAL ECHOCARDIOGRAM;  Surgeon: [name], MD;  Location: [place];  Service: Open Heart Surgery;  Laterality: N/A;  . LEFT AND RIGHT HEART CATHETERIZATION WITH CORONARY ANGIOGRAM N/A 11/08/2013   Procedure: LEFT AND RIGHT HEART CATHETERIZATION WITH CORONARY ANGIOGRAM;  Surgeon: Blane Ohara, MD;  Location: [facility] CATH LAB;  Service: Cardiovascular;  Laterality: N/A;  . PACEMAKER IMPLANT N/A 01/31/2018   Procedure: PACEMAKER IMPLANT;  Surgeon: Thompson Grayer, MD;  Location: [place] CV LAB;  Service: Cardiovascular;  Laterality: N/A;  . PATENT FORAMEN OVALE CLOSURE N/A 12/05/2013   Procedure: PATENT FORAMEN OVALE CLOSURE;  Surgeon: [name], MD;  Location: [place];  Service: Open Heart Surgery;  Laterality: N/A;  . PILONIDAL CYST / SINUS EXCISION  01/20/2018   Dr Brantley Stage  . TEE WITHOUT CARDIOVERSION N/A 10/23/2013  Procedure: TRANSESOPHAGEAL ECHOCARDIOGRAM (TEE);  Surgeon: Larey Dresser, MD;  Location: [place];  Service: Cardiovascular;  Laterality: N/A;  . TEE WITHOUT CARDIOVERSION N/A 01/19/2018   Procedure: TRANSESOPHAGEAL ECHOCARDIOGRAM (TEE);  Surgeon: Sanda Klein, MD;  Location: [place];  Service: Cardiovascular;  Laterality: N/A;  . TEE WITHOUT  CARDIOVERSION N/A 01/26/2018   Procedure: TRANSESOPHAGEAL ECHOCARDIOGRAM (TEE);  Surgeon: [name], MD;  Location: [place];  Service: Open Heart Surgery;  Laterality: N/A;  . VASECTOMY  06/2002     Current Outpatient Medications  Medication Sig Dispense Refill  . allopurinol (ZYLOPRIM) 300 MG tablet Take 1 tablet (300 mg total) by mouth daily. 30 tablet 3  . aspirin EC 325 MG EC tablet Take 1 tablet (325 mg total) by mouth daily.    . [place]-Glucosamine-Vit D (GLUCOSAMINE COMPLEX PO) Take 1 capsule by mouth daily.    Marland Kitchen ceFAZolin (ANCEF) IVPB Inject 2 g into the vein every 8 (eight) hours. Indication:  MSSE endocarditis Last Day of Therapy:  03/09/18 Labs - Once weekly:  CBC/D and CMP, Labs - Every other week:  ESR and CRP 108 Units 0  . Coenzyme Q10 300 MG CAPS Take 1 capsule by mouth daily.    . FEROSUL 325 (65 Fe) MG tablet Take 325 mg by mouth daily.   0  . ferrous [id number] C-folic acid (TRINSICON / FOLTRIN) capsule Take 1 capsule by mouth 3 (three) times daily after meals. 30 capsule 1  . folic acid (FOLVITE) 1 MG tablet Take 1 mg by mouth daily.   0  . Multiple Vitamin (MULTIVITAMIN WITH MINERALS) TABS tablet Take 1 tablet by mouth daily.    . Omega 3 1000 MG CAPS Take 3 capsules by mouth daily.    . Probiotic Product (PROBIOTIC PO) Take 1 capsule by mouth daily.    . rifampin (RIFADIN) 300 MG capsule Take 1 capsule (300 mg total) by mouth 3 (three) times daily with meals. 111 capsule 0  . TURMERIC PO Take 1 capsule by mouth daily.     No current facility-administered medications for this visit.     Allergies:   Patient has no known allergies.    Social History:  The patient  reports that he has never smoked. He has never used smokeless tobacco. He reports that he drinks alcohol. He reports that he does not use drugs.   Family History:  The patient's family history includes Cancer in his daughter, father, maternal grandfather, maternal grandmother, mother,  paternal grandfather, and paternal grandmother; Colon cancer (age of onset: 77) in his paternal grandmother; Colon cancer (age of onset: 32) in his father; Diabetes in his father, maternal grandfather, maternal grandmother, mother, paternal grandfather, and paternal grandmother; Heart attack in his maternal grandmother; Hypertension in his maternal grandmother; Stroke in his paternal grandfather.    ROS:  Please see the history of present illness.   Otherwise, review of systems are positive for none.   All other systems are reviewed and negative.    PHYSICAL EXAM: VS:  BP 130/90   Pulse 82   Ht 5' 11"  (1.803 m)   Wt 180 lb 12.8 oz (82 kg)   SpO2 97%   BMI 25.22 kg/m  , BMI Body mass index is 25.22 kg/m. GEN: Well nourished, well developed, in no acute distress HEENT: sclera anicteric Vasc: no JVD, carotid bruits, or masses; PICC line in LUE without erythema or swelling Cardiac: RRR; no murmurs, rubs, or gallops, no edema; L upper chest wall PPM  site healing well without erythema. Sternal wound also healing well  Respiratory:  clear to auscultation bilaterally, normal work of breathing GI: soft, nontender, nondistended, + BS MS: no deformity or atrophy Skin: warm and dry, no rash Neuro:  Strength and sensation are intact Psych: euthymic mood, full affect   EKG:  EKG is ordered today. The ekg ordered today demonstrates a-sensed, v-paced rhythm   Recent Labs: 12/06/2017: TSH 2.94 01/27/2018: Magnesium 2.5 02/10/2018: ALT 10; BUN 15; Creat 0.91; Hemoglobin 9.0; Platelets 522; Potassium 4.4; Sodium 139    Lipid Panel    Component Value Date/Time   CHOL 171 12/06/2017 0833   TRIG 75 12/06/2017 0833   HDL 48 12/06/2017 0833   CHOLHDL 3.6 12/06/2017 0833   VLDL 13 04/21/2015 0912   LDLCALC 107 (H) 12/06/2017 0833      Wt Readings from Last 3 Encounters:  02/21/18 180 lb 12.8 oz (82 kg)  02/01/18 191 lb 3.2 oz (86.7 kg)  02/18/17 195 lb (88.5 kg)      Other studies  Reviewed: Additional studies/ records that were reviewed today include:   Transesophageal echocardiogram 01/26/18: Study Conclusions  - Left ventricle: The cavity size was normal. Wall thickness was   normal. Systolic function was normal. The estimated ejection   fraction was in the range of 55% to 65%. Wall motion was normal;   there were no regional wall motion abnormalities. - Aortic valve: Normal-sized, noncalcified annulus. There is an   area of possible fluid collection between the bioprosthetic valve   and the annulus/root at the non-coronary cusp. A bioprosthesis   was present. Cusp separation was normal. Mobility was not   restricted. There was a large, 3.1 cm (L) x 2.0 cm (W), mobile   vegetation on the left ventricular aspect of the prosthetic   valve. The vegetation protrudes into the outflow tract with   systole. Transvalvular velocity was increased. There was moderate   stenosis, most likely due to the vegetation itself. Vmax 335   cm/s, with peak gradient 45 mmHg, mean gradient 24 mmHg. AVA   (VTI) 0.77 cm2. There was no significant regurgitation. - Aorta: The LVOT measures 2.33 cm, Sinus of Valsalva 4.45 cm,   Sino-tubular junction 2.87 cm. As previously mentioned, there is   an area of the Aortic root, near the non-coronary cusp which   could represent infection. The descending aorta was normal, not   dilated, and non-diseased. - Mitral valve: No evidence of vegetation. There was mild   regurgitation directed centrally. - Tricuspid valve: No evidence of vegetation. There was mild   regurgitation. - Staged echo: Limited post CBP exam: LV function improved to   normal with time off of CPB. Wall motion appears normal.   Approximate overall EF 60%. The homograft aortic valve and aortic   root are well positioned. The valve is functioning well. No AI   seen in LV outflow tract. Post aortic valve replacement images   demonstrate no residual valvular insufficiency or  perivalvular   leak. No change post bypass in mitral valve function.  Impressions:  - Post aortic valve and root replacement surgery, the bioprosthetic   valve appears to be functioning normally. Excellent aortic valve   function without evidence for perivalvular leak. Other valves   unchanged. No other significant changes from pre-bypass images.  Transesophageal echocardiogram 01/19/18: Study Conclusions  - Left ventricle: The cavity size was normal. There was mild   concentric hypertrophy. Systolic function was normal. The  estimated ejection fraction was in the range of 60% to 65%. Wall   motion was normal; there were no regional wall motion   abnormalities. - Aortic valve: There was a large, 2.8 cm (L) x 1.2 cm (W) x 0.8   cm, mobile vegetation on the left ventricular aspect of the   anterior bioprosthetic cusp. There was mild regurgitation. - Ascending aorta: The ascending aorta was mildly dilated. - Mitral valve: No evidence of vegetation. There was mild   regurgitation. - Left atrium: The atrium was mildly dilated. No evidence of   thrombus in the atrial cavity or appendage. No spontaneous echo   contrast was observed. - Right atrium: No evidence of thrombus in the atrial cavity or   appendage. - Atrial septum: No defect or patent foramen ovale was identified. - Tricuspid valve: No evidence of vegetation. - Pulmonic valve: No evidence of vegetation.   ASSESSMENT AND PLAN:   1. Bioprosthetic aortic valve replacement: initially performed in 2015 with redo replacement 01/26/18 2/2 endocarditis. On aspirin 353m daily. Scheduled to see Dr. ORoxy Manns12/16/2019. No complaints post surgery - Continue follow-up with Dr. ORoxy Manns- Will arrange follow-up echocardiogram in 3 months   2. CHB s/p PPM placement: found to be in CHB during recent hospitalization with PPM placed 111/17/35without complications. PPM check 02/13/18 with normal function. Scheduled to see Dr. ARayann Hemanin Feb  2020. EKG today with A-sensed, V-paced rhythm. Wound site is healing well.  - Continue routine outpatient follow-up with EP.   3. Staph epidermis bacteremia/ aortic valve endocarditis: he is on rifampin and cefazolin to complete a 6 weeks course of antibiotics (end 03/09/18). Scheduled to see ID 03/01/18 - Continue management per ID   Current medicines are reviewed at length with the patient today.  The patient does not have concerns regarding medicines.  The following changes have been made:  no change  Labs/ tests ordered today include: None  Orders Placed This Encounter  Procedures  . EKG 12-Lead  . ECHOCARDIOGRAM COMPLETE     Disposition:   FU with Dr. CBurt Knackin 6 months  Signed, KAbigail Butts PA-C  02/21/2018 12:18 PM

## 2018-02-22 DIAGNOSIS — A4101 Sepsis due to Methicillin susceptible Staphylococcus aureus: Secondary | ICD-10-CM | POA: Diagnosis not present

## 2018-02-22 DIAGNOSIS — R7881 Bacteremia: Secondary | ICD-10-CM | POA: Diagnosis not present

## 2018-02-22 DIAGNOSIS — I38 Endocarditis, valve unspecified: Secondary | ICD-10-CM | POA: Diagnosis not present

## 2018-02-23 DIAGNOSIS — Z736 Limitation of activities due to disability: Secondary | ICD-10-CM

## 2018-02-24 ENCOUNTER — Telehealth: Payer: Self-pay | Admitting: Sports Medicine

## 2018-02-24 NOTE — Telephone Encounter (Signed)
Amber, pt  scheduled this appt.Marland KitchenMarland KitchenRemove skin tab, on right cheek, check cyst, is this a 15 or 30 minute appt?   thanks

## 2018-02-24 NOTE — Telephone Encounter (Signed)
Thank you :)

## 2018-02-24 NOTE — Telephone Encounter (Signed)
15 minutes is fine.

## 2018-02-27 DIAGNOSIS — A4101 Sepsis due to Methicillin susceptible Staphylococcus aureus: Secondary | ICD-10-CM | POA: Diagnosis not present

## 2018-02-27 DIAGNOSIS — Z48812 Encounter for surgical aftercare following surgery on the circulatory system: Secondary | ICD-10-CM | POA: Diagnosis not present

## 2018-02-27 DIAGNOSIS — I38 Endocarditis, valve unspecified: Secondary | ICD-10-CM | POA: Diagnosis not present

## 2018-02-27 DIAGNOSIS — R7881 Bacteremia: Secondary | ICD-10-CM | POA: Diagnosis not present

## 2018-02-27 DIAGNOSIS — T826XXA Infection and inflammatory reaction due to cardiac valve prosthesis, initial encounter: Secondary | ICD-10-CM | POA: Diagnosis not present

## 2018-02-28 ENCOUNTER — Encounter: Payer: Self-pay | Admitting: Sports Medicine

## 2018-02-28 ENCOUNTER — Ambulatory Visit (INDEPENDENT_AMBULATORY_CARE_PROVIDER_SITE_OTHER): Payer: 59 | Admitting: Sports Medicine

## 2018-02-28 DIAGNOSIS — L0591 Pilonidal cyst without abscess: Secondary | ICD-10-CM | POA: Diagnosis not present

## 2018-02-28 DIAGNOSIS — D649 Anemia, unspecified: Secondary | ICD-10-CM

## 2018-02-28 DIAGNOSIS — L918 Other hypertrophic disorders of the skin: Secondary | ICD-10-CM

## 2018-02-28 LAB — FUNGUS CULTURE WITH STAIN

## 2018-02-28 LAB — FUNGAL ORGANISM REFLEX

## 2018-02-28 LAB — FUNGUS CULTURE RESULT

## 2018-02-28 MED ORDER — FE FUMARATE-B12-VIT C-FA-IFC PO CAPS: 1.0000 | ORAL_CAPSULE | Freq: Three times a day (TID) | ORAL | 1 refills | Status: DC

## 2018-02-28 NOTE — Assessment & Plan Note (Signed)
Has only been doing iron daily, increase to 3 times daily, still is feeling somewhat fatigued. I probably check a CBC at the follow-up visit.

## 2018-02-28 NOTE — Progress Notes (Signed)
Subjective:    CC: Several issues  HPI: Anemia: Currently on iron supplementation, supposed to be taking iron 3 times daily but only doing it once daily.  He does get the expected constipation but agrees to do some Colace with it.  Skin tag: Would like cryotherapy today.  Right side of cheek.  Pilonidal disease: Was opened during his hospitalization, he has not yet followed up with general surgery.  I reviewed the past medical history, family history, social history, surgical history, and allergies today and no changes were needed.  Please see the problem list section below in epic for further details.  Past Medical History: Past Medical History:  Diagnosis Date  . Aortic stenosis   . Ascending aorta enlargement ([place])   . Bicuspid aortic valve   . Heart murmur   . Hypertension   . Patent foramen ovale    Closed at the time of aortic valve replacement  . Pilonidal cyst   . S/P aortic valve replacement with bioprosthetic valve 12/05/2013   25 mm [facility] Ease bovine pericardial tissue valve   . S/P redo aortic valve replacement with human allograft aortic root graft 01/26/2018  . 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 AORTIC VALVE REPLACEMENT N/A 12/05/2013   Procedure: AORTIC VALVE REPLACEMENT (AVR);  Surgeon: [name], MD;  Location: [place];  Service: Open Heart Surgery;  Laterality: N/A;  . AORTIC VALVE REPLACEMENT N/A 01/26/2018   Procedure: REDO STERNOTOMY, REDO AORTIC VALVE REPLACEMENT WITH HUMAN ALLOGRAFT INCLUDING AORTIC ROOT REPLACEMENT,  REPAIR ASCENDING THORACIC AORTIC ANEURYSM;  Surgeon: [name], MD;  Location: [place];  Service: Open Heart Surgery;  Laterality: N/A;  . HAND SURGERY Left 07/1977  . INTRAOPERATIVE TRANSESOPHAGEAL ECHOCARDIOGRAM N/A 12/05/2013   Procedure: INTRAOPERATIVE TRANSESOPHAGEAL ECHOCARDIOGRAM;  Surgeon: [name], MD;   Location: [place];  Service: Open Heart Surgery;  Laterality: N/A;  . LEFT AND RIGHT HEART CATHETERIZATION WITH CORONARY ANGIOGRAM N/A 11/08/2013   Procedure: LEFT AND RIGHT HEART CATHETERIZATION WITH CORONARY ANGIOGRAM;  Surgeon: Blane Ohara, MD;  Location: [facility] CATH LAB;  Service: Cardiovascular;  Laterality: N/A;  . PACEMAKER IMPLANT N/A 01/31/2018   Procedure: PACEMAKER IMPLANT;  Surgeon: Thompson Grayer, MD;  Location: [place] CV LAB;  Service: Cardiovascular;  Laterality: N/A;  . PATENT FORAMEN OVALE CLOSURE N/A 12/05/2013   Procedure: PATENT FORAMEN OVALE CLOSURE;  Surgeon: [name], MD;  Location: [place];  Service: Open Heart Surgery;  Laterality: N/A;  . PILONIDAL CYST / SINUS EXCISION  01/20/2018   Dr Brantley Stage  . TEE WITHOUT CARDIOVERSION N/A 10/23/2013   Procedure: TRANSESOPHAGEAL ECHOCARDIOGRAM (TEE);  Surgeon: Larey Dresser, MD;  Location: [place];  Service: Cardiovascular;  Laterality: N/A;  . TEE WITHOUT CARDIOVERSION N/A 01/19/2018   Procedure: TRANSESOPHAGEAL ECHOCARDIOGRAM (TEE);  Surgeon: Sanda Klein, MD;  Location: [place];  Service: Cardiovascular;  Laterality: N/A;  . TEE WITHOUT CARDIOVERSION N/A 01/26/2018   Procedure: TRANSESOPHAGEAL ECHOCARDIOGRAM (TEE);  Surgeon: [name], MD;  Location: [place];  Service: Open Heart Surgery;  Laterality: N/A;  . VASECTOMY  06/2002   Social History: Social History   Socioeconomic History  . Marital status: Married    Spouse name: Not on file  . Number of children: 5  . Years of education: Not on file  . Highest education level: Not on file  Occupational History  . Occupation:  Art gallery manager    Employer: [employer]  Social Needs  . Financial resource strain: Not on file  . Food insecurity:    Worry: Not on file    Inability: Not on file  . Transportation needs:    Medical: Not on file    Non-medical: Not on file  Tobacco Use  . Smoking status: Never Smoker  . Smokeless tobacco: Never Used    Substance and Sexual Activity  . Alcohol use: Yes    Alcohol/week: 0.0 standard drinks    Comment: occ  . Drug use: No  . Sexual activity: Not on file  Lifestyle  . Physical activity:    Days per week: Not on file    Minutes per session: Not on file  . Stress: Not on file  Relationships  . Social connections:    Talks on phone: Not on file    Gets together: Not on file    Attends religious service: Not on file    Active member of club or organization: Not on file    Attends meetings of clubs or organizations: Not on file    Relationship status: Not on file  Other Topics Concern  . Not on file  Social History Narrative   The patient is married. He is originally from [place]. He works as an Art gallery manager. He does not smoke cigarettes or drink alcohol.   Family History: Family History  Problem Relation Age of Onset  . Diabetes Mother   . Cancer Mother        colon  . Diabetes Father   . Cancer Father        colon  . Colon cancer Father 2  . Diabetes Paternal Grandmother   . Cancer Paternal Grandmother   . Colon cancer Paternal Grandmother 69  . Cancer Daughter        leukemia  . Heart attack Maternal Grandmother   . Hypertension Maternal Grandmother   . Diabetes Maternal Grandmother   . Cancer Maternal Grandmother   . Diabetes Maternal Grandfather   . Cancer Maternal Grandfather   . Stroke Paternal Grandfather   . Diabetes Paternal Grandfather   . Cancer Paternal Grandfather    Allergies: No Known Allergies Medications: See med rec.  Review of Systems: No fevers, chills, night sweats, weight loss, chest pain, or shortness of breath.   Objective:    General: Well Developed, well nourished, and in no acute distress.  Neuro: Alert and oriented x3, extra-ocular muscles intact, sensation grossly intact.  HEENT: Normocephalic, atraumatic, pupils equal round reactive to light, neck supple, no masses, no lymphadenopathy, thyroid nonpalpable.  Skin: Warm and  dry, no rashes.  Skin tag on the right cheek Cardiac: Regular rate and rhythm, no murmurs rubs or gallops, no lower extremity edema.  Respiratory: Clear to auscultation bilaterally. Not using accessory muscles, speaking in full sentences. Buttock: Open incision, a couple of centimeters across, minimal drainage.  No malodorous discharge.  Procedure:  Cryodestruction of right facial skin tag Consent obtained and verified. Time-out conducted. Noted no overlying erythema, induration, or other signs of local infection. Completed without difficulty using Cryo-Gun. Advised to call if fevers/chills, erythema, induration, drainage, or persistent bleeding.  Impression and Recommendations:    Normocytic anemia Has only been doing iron daily, increase to 3 times daily, still is feeling somewhat fatigued. I probably check a CBC at the follow-up visit.  Pilonidal cyst Excision as an inpatient. I would like to do a referral back to [place] [facility] for follow-up care.  Skin tag Right side of the face, cryotherapy as above. ___________________________________________ Gwen Her. Dianah Field, M.D., ABFM., CAQSM. Primary Care and Sports Medicine [facility]  Adjunct Professor of [employer]

## 2018-02-28 NOTE — Assessment & Plan Note (Signed)
Right side of the face, cryotherapy as above.

## 2018-02-28 NOTE — Assessment & Plan Note (Signed)
Excision as an inpatient. I would like to do a referral back to [place] [facility] for follow-up care.

## 2018-03-01 ENCOUNTER — Other Ambulatory Visit: Payer: Self-pay

## 2018-03-01 ENCOUNTER — Ambulatory Visit (INDEPENDENT_AMBULATORY_CARE_PROVIDER_SITE_OTHER): Payer: 59 | Admitting: Infectious Diseases

## 2018-03-01 ENCOUNTER — Encounter: Payer: Self-pay | Admitting: Infectious Diseases

## 2018-03-01 VITALS — BP 121/70 | HR 79 | Temp 97.8°F | Ht 71.0 in | Wt 180.0 lb

## 2018-03-01 DIAGNOSIS — I330 Acute and subacute infective endocarditis: Secondary | ICD-10-CM

## 2018-03-01 DIAGNOSIS — I38 Endocarditis, valve unspecified: Secondary | ICD-10-CM

## 2018-03-01 DIAGNOSIS — L0591 Pilonidal cyst without abscess: Secondary | ICD-10-CM | POA: Diagnosis not present

## 2018-03-01 DIAGNOSIS — Z954 Presence of other heart-valve replacement: Secondary | ICD-10-CM

## 2018-03-01 DIAGNOSIS — I350 Nonrheumatic aortic (valve) stenosis: Secondary | ICD-10-CM

## 2018-03-01 DIAGNOSIS — Z5181 Encounter for therapeutic drug level monitoring: Secondary | ICD-10-CM

## 2018-03-01 DIAGNOSIS — Z452 Encounter for adjustment and management of vascular access device: Secondary | ICD-10-CM

## 2018-03-01 DIAGNOSIS — B957 Other staphylococcus as the cause of diseases classified elsewhere: Secondary | ICD-10-CM

## 2018-03-01 DIAGNOSIS — Z95828 Presence of other vascular implants and grafts: Secondary | ICD-10-CM

## 2018-03-01 DIAGNOSIS — Z950 Presence of cardiac pacemaker: Secondary | ICD-10-CM

## 2018-03-01 DIAGNOSIS — T826XXD Infection and inflammatory reaction due to cardiac valve prosthesis, subsequent encounter: Secondary | ICD-10-CM | POA: Diagnosis not present

## 2018-03-01 DIAGNOSIS — R7881 Bacteremia: Secondary | ICD-10-CM

## 2018-03-01 NOTE — Progress Notes (Signed)
Patient: Patrick Walter  DOB: 1955-03-31 MRN: [id number] PCP: [name], MD  Referring Provider: [facility]   Patient Active Problem List   Diagnosis Date Noted  . Medication monitoring encounter 03/05/2018  . PICC (peripherally inserted central catheter) in place 03/05/2018  . Skin tag 02/28/2018  . Foot swelling 02/09/2018  . S/P redo aortic valve replacement with human allograft aortic root graft 01/26/2018  . Pilonidal cyst   . Septic embolism ([place])   . Prosthetic valve endocarditis ([place])   . Staphylococcus epidermidis bacteremia   . Normocytic anemia 01/16/2018  . Hypercalcemia 12/07/2017  . Primary osteoarthritis of left hip 07/01/2016  . Ascending aorta enlargement ([place])   . Family history of cancer in father 12/27/2013  . Low back pain 07/24/2013  . Essential hypertension, benign 07/24/2013     Subjective:   Chief Complaint  Patient presents with  . Hospitalization Follow-up    doing well, [facility] nursing    HPI: Patrick Walter is a 62 y.o. male with pmhx significant for aortic stenosis s/p aortic valve replacement in 2015 by Dr. Roxy Walter. Following his initial surgery he was doing very well and lost significant amount of weight with lifestyle changes including marathon training.   He was hospitalized October 28th with a 1 month history of low-grade fevers, body aches and malaise with worsening course. He was febrile on presentation to ER with fever to 102 F, leukocytosis 16.8K and soft blood pressures. All blood culture bottles were positive for methicillin sensitive staphylococcus epidermidis. He was seen by Dr. Johnnye Walter in the hospital and started on cefazolin IV. TEE revealed vegetations involving his prosthetic valve with Rifampin added after this finding. MRI of the brain was assessed a few days into admission and revealed 2 areas of infarction d/t septic emboli from PVE (asymptomatic) and gentamicin was added for synergy until valve could be  replaced. His blood cultures cleared and he was taken to surgery 01/26/18 for redo sternotomy and AoV replacement with human allograft and repair of ascending thoracic aortic aneurysm. Post op he was changed to nafcillin + rifampin for better CNS penetration. Unfortunately he required PPM placement with post-op 3rd degree heart block ([place]). 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 This was excised 01/20/18 noted to be w/o signs of infection at this time; he was instructed to continue local wound care.   He was discharged home on 02/01/18 to home with [facility] on IV cefazolin and oral rifampin therapy (nafcillin continuous infusion was declined by the patient d/t complexity).   Since discharge he has been recovering well at home. He feels that he has actually recovered better/faster this time around. He has resumed some light physical activity (walking 2-3 miles a day and now stationary bike). His incisions are healing well and w/o concern for infection. He has questions today about his iron and how his red blood cell count looks. He would also like [place] to ensure arrangements to pull LUE PICC line out on planned date 03/08/18 with home health team. PICC line is without pain, drainage or erythema and is well maintained by [facility] Team. No swelling or altered sensation in affected distal extremity. He has done well with IV and PO antibiotics and has missed no doses since discharge home. Denies any diarrhea, rashes, pruritis, abdominal pain or discomfort. He initially had some nausea with rifampin however  he is "used to this now and no longer a problem."   He is still having pain at the scrotal site from previous excision of cyst. He saw his primary care provider Dr. Dianah Walter yesterday and arrangements were made to schedule a follow up appointment to address open surgical  wound.  Review of Systems  Constitutional: Negative for chills and fever.  HENT: Negative for tinnitus.   Eyes: Negative for blurred vision and photophobia.  Respiratory: Negative for cough and sputum production.   Cardiovascular: Negative for chest pain.  Gastrointestinal: Negative for diarrhea, nausea and vomiting.  Genitourinary: Negative for dysuria.  Skin: Negative for rash.       Scrotal pain d/t open incision as above.   Neurological: Negative for headaches.    Past Medical History:  Diagnosis Date  . Aortic stenosis   . Ascending aorta enlargement ([place])   . Bicuspid aortic valve   . Heart murmur   . Hypertension   . Patent foramen ovale    Closed at the time of aortic valve replacement  . Pilonidal cyst   . S/P aortic valve replacement with bioprosthetic valve 12/05/2013   25 mm [facility] Ease bovine pericardial tissue valve   . S/P redo aortic valve replacement with human allograft aortic root graft 01/26/2018  . Septic embolism (H[facility])    Brain, spleen, superior mesenteric artery    Outpatient Medications Prior to Visit  Medication Sig Dispense Refill  . allopurinol (ZYLOPRIM) 300 MG tablet Take 1 tablet (300 mg total) by mouth daily. 30 tablet 3  . aspirin EC 325 MG EC tablet Take 1 tablet (325 mg total) by mouth daily.    . bisacodyl (DULCOLAX) 5 MG EC tablet Take 10 mg by mouth daily as needed for moderate constipation (take with iron).    . Boswellia-Glucosamine-Vit D (GLUCOSAMINE COMPLEX PO) Take 1 capsule by mouth daily.    Marland Kitchen ceFAZolin (ANCEF) IVPB Inject 2 g into the vein every 8 (eight) hours. Indication:  MSSE endocarditis Last Day of Therapy:  03/09/18 Labs - Once weekly:  CBC/D and CMP, Labs - Every other week:  ESR and CRP 108 Units 0  . Coenzyme Q10 300 MG CAPS Take 1 capsule by mouth daily.    . ferrous [id number] C-folic acid (TRINSICON / FOLTRIN) capsule Take 1 capsule by mouth 3 (three) times daily after meals. 90 capsule 1  . folic  acid (FOLVITE) 1 MG tablet Take 1 mg by mouth daily.   0  . Multiple Vitamin (MULTIVITAMIN WITH MINERALS) TABS tablet Take 1 tablet by mouth daily.    . Omega 3 1000 MG CAPS Take 3 capsules by mouth daily.    . Probiotic Product (PROBIOTIC PO) Take 1 capsule by mouth daily.    . rifampin (RIFADIN) 300 MG capsule Take 1 capsule (300 mg total) by mouth 3 (three) times daily with meals. 111 capsule 0  . TURMERIC PO Take 1 capsule by mouth daily.     No facility-administered medications prior to visit.      No Known Allergies  Social History   Tobacco Use  . Smoking status: Never Smoker  . Smokeless tobacco: Never Used  Substance Use Topics  . Alcohol use: Yes    Alcohol/week: 0.0 standard drinks    Comment: occ  . Drug use: No    Objective:   Vitals:   03/01/18 1425 03/01/18 1429  BP: 132/80 121/70  Pulse: 78 79  Temp: 97.8 F (36.6 C)   TempSrc:  Oral   Weight: 180 lb (81.6 kg)   Height: 5' 11"  (1.803 m)    Body mass index is 25.1 kg/m.  Physical Exam Vitals signs reviewed.  HENT:     Mouth/Throat:     Mouth: No oral lesions.     Dentition: Normal dentition. No dental caries.  Eyes:     General: No scleral icterus. Cardiovascular:     Rate and Rhythm: Normal rate and regular rhythm.     Heart sounds: Normal heart sounds.  Pulmonary:     Effort: Pulmonary effort is normal.     Breath sounds: Normal breath sounds.  Abdominal:     General: There is no distension.     Palpations: Abdomen is soft.     Tenderness: There is no abdominal tenderness.  Lymphadenopathy:     Cervical: No cervical adenopathy.  Skin:    General: Skin is warm and dry.     Findings: No rash.     Comments: Midsternal incision well healed and w/o signs of infection Left upper chest PPM incision well healed w/o signs of infection.  Declined scrotal eval as his PCP just looked at it yesterday.   Neurological:     Mental Status: He is alert and oriented to person, place, and time.   LUE  PICC line - clean/dry dressing. Insertion site w/o erythema, tenderness, drainage, cording or distal swelling of affected extremity    Lab Results: Lab Results  Component Value Date   WBC 9.5 02/10/2018   HGB 9.0 (L) 02/10/2018   HCT 28.0 (L) 02/10/2018   MCV 85.4 02/10/2018   PLT 522 (H) 02/10/2018    Lab Results  Component Value Date   CREATININE 0.91 02/10/2018   BUN 15 02/10/2018   NA 139 02/10/2018   K 4.4 02/10/2018   CL 104 02/10/2018   CO2 27 02/10/2018    Lab Results  Component Value Date   ALT 10 02/10/2018   AST 19 02/10/2018   ALKPHOS 107 02/01/2018   BILITOT 0.4 02/10/2018     Assessment & Plan:   Problem List Items Addressed This Visit      Unprioritized   Medication monitoring encounter    All OPAT lab work reviewed and within normal limits. Labs 11/25 with normal wbc, slightly elevated platelet count (437), creatinine normal and LFTs normal aside from slightly elevated AlkPhos 129. Hemoglobin up to 10.5. Iron management per PCP. Labs were drawn earlier this week on 12/11 and will look for results.       PICC (peripherally inserted central catheter) in place    Site unremarkable, well maintained and functioning as expected. Continue care and maintenance until planned end of IV antibiotics. Home Health team to remove at completion.        Pilonidal cyst    Reviewed Dr. Landry Corporal note - some minimal drainage w/o odor but still open a few centimeters. He has been referred back to [facility] for further recommendations for site care.       Prosthetic valve endocarditis (HCC) - Primary   S/P redo aortic valve replacement with human allograft aortic root graft    He seems to be recovering nicely following redo surgery. He has follow up Monday with Dr. Roxy Walter. All incision sites appear to be intact and healing well.       Staphylococcus epidermidis bacteremia    Secondary to PVE - he is now on day 34/42 with dual therapy Cefazolin IV +  Rifampin PO and  is tolerating well w/o concern.         Hospitalization records, OPAT weekly labs and office visits reviewed.   Return in about 3 weeks (around 03/22/2018).    [name], MSN, NP-C [facility] [place] Pager: [phone] Office: [phone]  03/05/18  3:40 PM

## 2018-03-01 NOTE — Patient Instructions (Addendum)
Your hemoglobin (red blood cells) are up to 10.5. Hopefully just a little more time on the 3 times a day iron this will be better   Will continue your IV antibiotics as planned through 03/09/18 - we will call your home health team to have them pull your picc line.   Plan on stopping the rifampin when your IV antibiotics stop as well - if something changes with your lab work will give you a call.   Please return in 3 -4 weeks to check in again with Dr. Linus Salmons or Greg/Annslee Tercero again.

## 2018-03-03 DIAGNOSIS — A4101 Sepsis due to Methicillin susceptible Staphylococcus aureus: Secondary | ICD-10-CM | POA: Diagnosis not present

## 2018-03-03 DIAGNOSIS — R7881 Bacteremia: Secondary | ICD-10-CM | POA: Diagnosis not present

## 2018-03-03 DIAGNOSIS — I38 Endocarditis, valve unspecified: Secondary | ICD-10-CM | POA: Diagnosis not present

## 2018-03-05 DIAGNOSIS — Z5181 Encounter for therapeutic drug level monitoring: Secondary | ICD-10-CM | POA: Insufficient documentation

## 2018-03-05 DIAGNOSIS — Z452 Encounter for adjustment and management of vascular access device: Secondary | ICD-10-CM | POA: Insufficient documentation

## 2018-03-05 NOTE — Assessment & Plan Note (Addendum)
All OPAT lab work reviewed and within normal limits. Labs 11/25 with normal wbc, slightly elevated platelet count (437), creatinine normal and LFTs normal aside from slightly elevated AlkPhos 129. Hemoglobin up to 10.5. Iron management per PCP. Labs were drawn earlier this week on 12/11 and will look for results.

## 2018-03-05 NOTE — Assessment & Plan Note (Signed)
He seems to be recovering nicely following redo surgery. He has follow up Monday with Dr. Roxy Manns. All incision sites appear to be intact and healing well.

## 2018-03-05 NOTE — Assessment & Plan Note (Signed)
Site unremarkable, well maintained and functioning as expected. Continue care and maintenance until planned end of IV antibiotics. Home Health team to remove at completion.   

## 2018-03-05 NOTE — Assessment & Plan Note (Signed)
Secondary to PVE - he is now on day 34/42 with dual therapy Cefazolin IV + Rifampin PO and is tolerating well w/o concern.

## 2018-03-05 NOTE — Assessment & Plan Note (Signed)
Reviewed Dr. Landry Corporal note - some minimal drainage w/o odor but still open a few centimeters. He has been referred back to [facility] for further recommendations for site care.

## 2018-03-06 ENCOUNTER — Encounter: Payer: Self-pay | Admitting: Thoracic Surgery (Cardiothoracic Vascular Surgery)

## 2018-03-06 ENCOUNTER — Other Ambulatory Visit: Payer: Self-pay | Admitting: Thoracic Surgery (Cardiothoracic Vascular Surgery)

## 2018-03-06 ENCOUNTER — Ambulatory Visit (HOSPITAL_COMMUNITY)
Admission: RE | Admit: 2018-03-06 | Discharge: 2018-03-06 | Disposition: A | Discharge status: Home / Self Care | Payer: 59 | Source: Ambulatory Visit | Attending: Thoracic Surgery (Cardiothoracic Vascular Surgery) | Admitting: Thoracic Surgery (Cardiothoracic Vascular Surgery)

## 2018-03-06 ENCOUNTER — Ambulatory Visit (INDEPENDENT_AMBULATORY_CARE_PROVIDER_SITE_OTHER): Payer: Self-pay | Admitting: Thoracic Surgery (Cardiothoracic Vascular Surgery)

## 2018-03-06 VITALS — BP 131/81 | HR 80 | Resp 20 | Ht 71.0 in | Wt 180.0 lb

## 2018-03-06 DIAGNOSIS — Z952 Presence of prosthetic heart valve: Secondary | ICD-10-CM | POA: Diagnosis present

## 2018-03-06 DIAGNOSIS — Z954 Presence of other heart-valve replacement: Secondary | ICD-10-CM

## 2018-03-06 DIAGNOSIS — I38 Endocarditis, valve unspecified: Secondary | ICD-10-CM

## 2018-03-06 DIAGNOSIS — T826XXD Infection and inflammatory reaction due to cardiac valve prosthesis, subsequent encounter: Secondary | ICD-10-CM

## 2018-03-06 NOTE — Patient Instructions (Signed)
Continue all previous medications without any changes at this time  Continue to avoid any heavy lifting or strenuous use of your arms or shoulders for at least a total of three months from the time of surgery.  After three months you may gradually increase how much you lift or otherwise use your arms or chest as tolerated, with limits based upon whether or not activities lead to the return of significant discomfort.  You may return to driving an automobile as long as you are no longer requiring oral narcotic pain relievers during the daytime.  It would be wise to start driving only short distances during the daylight and gradually increase from there as you feel comfortable.

## 2018-03-06 NOTE — Progress Notes (Addendum)
[address]       [place] [address]             [phone]     CARDIOTHORACIC SURGERY OFFICE NOTE  Referring Provider is [name], MD PCP is [name], MD   HPI:  [name] status post aortic valve replacement using a bioprosthetic tissue valve in 2015 for bicuspid aortic valve disease with severe symptomatic aortic stenosis who returns to the office today for routine follow-up status post redo aortic valve replacement using a human allograft aortic root with reimplantation of the left main and right coronary arteries and resection and grafting of the ascending thoracic aorta on January 26, 2018 for Staphylococcus epidermidis prosthetic valve endocarditis with multiple large vegetations and annular abscess.  The patient's early postoperative recovery was notable for complete heart block requiring permanent pacemaker placement.  He other wise he did remarkably well and was discharged home on February 01, 2018.  Since then he has completed a course of intravenous Ancef home antibiotics.  He has been seen in follow-up at the infectious disease clinic and his PICC line has been scheduled to be removed next week.  He reports that he is doing exceptionally well.  He no longer has any pain in his chest.  He is exercising remarkably well, having written his stationary bicycle 13 miles this morning.  He plans to request to have his upper rate limit on his pacemaker increased to facilitate more intense exercise.  He has no shortness of breath and he feels dramatically better than he did prior to his surgery.  He is overall delighted with his progress.  He does report that he still has a small open wound at the site of his pilonidal cyst excision.  He states it is not red and does not appear to be infected.  He has been referred for follow-up appointment at [facility].  He was not offered any follow up appointment at the time his  pilonidal cyst was excised.  He states that he is also had some problems with slowly healing ulcer on his toe.  This is being addressed by his primary care physician.  Current Outpatient Medications  Medication Sig Dispense Refill  . allopurinol (ZYLOPRIM) 300 MG tablet Take 1 tablet (300 mg total) by mouth daily. 30 tablet 3  . aspirin EC 325 MG EC tablet Take 1 tablet (325 mg total) by mouth daily.    . bisacodyl (DULCOLAX) 5 MG EC tablet Take 10 mg by mouth daily as needed for moderate constipation (take with iron).    . Boswellia-Glucosamine-Vit D (GLUCOSAMINE COMPLEX PO) Take 1 capsule by mouth daily.    Marland Kitchen ceFAZolin (ANCEF) IVPB Inject 2 g into the vein every 8 (eight) hours. Indication:  MSSE endocarditis Last Day of Therapy:  03/09/18 Labs - Once weekly:  CBC/D and CMP, Labs - Every other week:  ESR and CRP 108 Units 0  . Coenzyme Q10 300 MG CAPS Take 1 capsule by mouth daily.    . ferrous [id number] C-folic acid (TRINSICON / FOLTRIN) capsule Take 1 capsule by mouth 3 (three) times daily after meals. 90 capsule 1  . folic acid (FOLVITE) 1 MG tablet Take 1 mg by mouth daily.   0  . Multiple Vitamin (MULTIVITAMIN WITH MINERALS) TABS tablet Take 1 tablet by mouth daily.    . Omega 3 1000 MG CAPS Take 3 capsules by mouth daily.    Marland Kitchen  Probiotic Product (PROBIOTIC PO) Take 1 capsule by mouth daily.    . rifampin (RIFADIN) 300 MG capsule Take 1 capsule (300 mg total) by mouth 3 (three) times daily with meals. 111 capsule 0  . TURMERIC PO Take 1 capsule by mouth daily.     No current facility-administered medications for this visit.       Physical Exam:   BP 131/81   Pulse 80   Resp 20   Ht 5' 11"  (1.803 m)   Wt 180 lb (81.6 kg)   SpO2 99% Comment: RA  BMI 25.10 kg/m   General:  Well-appearing  Chest:   Clear to auscultation  CV:   Regular rate and rhythm without murmur  Incisions:  Healing nicely, sternum is stable  Abdomen:  Soft nontender  Extremities:  Warm and  well-perfused  Diagnostic Tests:  CHEST - 2 VIEW  COMPARISON:  02/01/2018.  FINDINGS: Normal sized heart. Tortuous aorta. Stable median sternotomy wires and left subclavian pacemaker leads. Clear lungs with normal vascularity. No pleural fluid is seen today. Mild diffuse peribronchial thickening, unchanged. Unremarkable bones.  IMPRESSION: No acute abnormality. Stable mild chronic bronchitic changes.   Electronically Signed   By: Claudie Revering M.D.   On: 03/06/2018 14:27    Impression:  Patient is doing exceptionally well just 5 weeks status post redo aortic valve replacement using a human allograft aortic root graft for repair of prosthetic valve endocarditis with annular abscess as well as resection of grafting of the ascending thoracic aorta.  He does still have a small open wound at the site of his pilonidal cyst excision that does not appear to be infected but has not healed completely.  He plans to return for follow-up at [facility] and continue local wound care for now.  Plan:  I have encouraged the patient to continue to gradually increase his physical activity as tolerated but to refrain from any heavy lifting or strenuous use of his arms or shoulders for at least another 2 months.  We have not recommended any changes to his current medications, although I have told him it is reasonable to decrease his dose of aspirin to 81 mg daily.  He may resume driving an automobile.  All of his questions have been answered.  The patient will return to our office next fall, approximately 1 year following his surgery for routine follow-up.  He will call and return sooner should specific problems or questions arise.    Valentina Gu. Roxy Manns, MD 03/06/2018 3:12 PM

## 2018-03-09 ENCOUNTER — Telehealth: Payer: Self-pay | Admitting: Behavioral Health

## 2018-03-09 DIAGNOSIS — I38 Endocarditis, valve unspecified: Secondary | ICD-10-CM | POA: Diagnosis not present

## 2018-03-09 DIAGNOSIS — R7881 Bacteremia: Secondary | ICD-10-CM | POA: Diagnosis not present

## 2018-03-09 DIAGNOSIS — T826XXA Infection and inflammatory reaction due to cardiac valve prosthesis, initial encounter: Secondary | ICD-10-CM | POA: Diagnosis not present

## 2018-03-09 DIAGNOSIS — Z48812 Encounter for surgical aftercare following surgery on the circulatory system: Secondary | ICD-10-CM | POA: Diagnosis not present

## 2018-03-09 DIAGNOSIS — A4101 Sepsis due to Methicillin susceptible Staphylococcus aureus: Secondary | ICD-10-CM | POA: Diagnosis not present

## 2018-03-09 NOTE — Telephone Encounter (Signed)
[facility] nurse called stating she had not received PULL PICC order.  Gave verbal order per [name] to Pull PICC at the end of IV therapy 03/09/2018.  Hilda Blades verified order with read-back. Pricilla Riffle RN

## 2018-03-16 ENCOUNTER — Encounter: Payer: Self-pay | Admitting: Sports Medicine

## 2018-03-16 ENCOUNTER — Ambulatory Visit (INDEPENDENT_AMBULATORY_CARE_PROVIDER_SITE_OTHER): Payer: 59 | Admitting: Sports Medicine

## 2018-03-16 VITALS — BP 130/75 | HR 84 | Wt 180.0 lb

## 2018-03-16 DIAGNOSIS — L918 Other hypertrophic disorders of the skin: Secondary | ICD-10-CM

## 2018-03-16 DIAGNOSIS — D649 Anemia, unspecified: Secondary | ICD-10-CM | POA: Diagnosis not present

## 2018-03-16 LAB — POCT HEMOGLOBIN: Hemoglobin: 12.2 g/dL (ref 11–14.6)

## 2018-03-16 MED ORDER — FE FUMARATE-B12-VIT C-FA-IFC PO CAPS: 1.0000 | ORAL_CAPSULE | Freq: Three times a day (TID) | ORAL | 0 refills | Status: DC

## 2018-03-16 NOTE — Assessment & Plan Note (Addendum)
Repeat hemoglobin, has been doing iron sulfate 3 times daily. Improved to 12.2.

## 2018-03-16 NOTE — Progress Notes (Signed)
Subjective:    CC: Follow-up  HPI: Skin tag: Here for repeat cryotherapy.  Anemia: This is combination iron deficiency, chronic disease and blood loss anemia from his time in the hospital, he did have an aortic root replacement, aortic valve replacement secondary to endocarditis.  Overall doing extremely well.  I reviewed the past medical history, family history, social history, surgical history, and allergies today and no changes were needed.  Please see the problem list section below in epic for further details.  Past Medical History: Past Medical History:  Diagnosis Date  . Aortic stenosis   . Ascending aorta enlargement ([place])   . Bicuspid aortic valve   . Heart murmur   . Hypertension   . Patent foramen ovale    Closed at the time of aortic valve replacement  . Pilonidal cyst   . S/P aortic valve replacement with bioprosthetic valve 12/05/2013   25 mm [facility] Ease bovine pericardial tissue valve   . S/P redo aortic valve replacement with human allograft aortic root graft 01/26/2018  . 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 AORTIC VALVE REPLACEMENT N/A 12/05/2013   Procedure: AORTIC VALVE REPLACEMENT (AVR);  Surgeon: [name], MD;  Location: [place];  Service: Open Heart Surgery;  Laterality: N/A;  . AORTIC VALVE REPLACEMENT N/A 01/26/2018   Procedure: REDO STERNOTOMY, REDO AORTIC VALVE REPLACEMENT WITH HUMAN ALLOGRAFT INCLUDING AORTIC ROOT REPLACEMENT,  REPAIR ASCENDING THORACIC AORTIC ANEURYSM;  Surgeon: [name], MD;  Location: [place];  Service: Open Heart Surgery;  Laterality: N/A;  . HAND SURGERY Left 07/1977  . INTRAOPERATIVE TRANSESOPHAGEAL ECHOCARDIOGRAM N/A 12/05/2013   Procedure: INTRAOPERATIVE TRANSESOPHAGEAL ECHOCARDIOGRAM;  Surgeon: [name], MD;  Location: [place];  Service: Open Heart Surgery;  Laterality: N/A;  . LEFT AND RIGHT HEART  CATHETERIZATION WITH CORONARY ANGIOGRAM N/A 11/08/2013   Procedure: LEFT AND RIGHT HEART CATHETERIZATION WITH CORONARY ANGIOGRAM;  Surgeon: Blane Ohara, MD;  Location: [facility] CATH LAB;  Service: Cardiovascular;  Laterality: N/A;  . PACEMAKER IMPLANT N/A 01/31/2018   Procedure: PACEMAKER IMPLANT;  Surgeon: Thompson Grayer, MD;  Location: [place] CV LAB;  Service: Cardiovascular;  Laterality: N/A;  . PATENT FORAMEN OVALE CLOSURE N/A 12/05/2013   Procedure: PATENT FORAMEN OVALE CLOSURE;  Surgeon: [name], MD;  Location: [place];  Service: Open Heart Surgery;  Laterality: N/A;  . PILONIDAL CYST / SINUS EXCISION  01/20/2018   Dr Brantley Stage  . TEE WITHOUT CARDIOVERSION N/A 10/23/2013   Procedure: TRANSESOPHAGEAL ECHOCARDIOGRAM (TEE);  Surgeon: Larey Dresser, MD;  Location: [place];  Service: Cardiovascular;  Laterality: N/A;  . TEE WITHOUT CARDIOVERSION N/A 01/19/2018   Procedure: TRANSESOPHAGEAL ECHOCARDIOGRAM (TEE);  Surgeon: Sanda Klein, MD;  Location: [place];  Service: Cardiovascular;  Laterality: N/A;  . TEE WITHOUT CARDIOVERSION N/A 01/26/2018   Procedure: TRANSESOPHAGEAL ECHOCARDIOGRAM (TEE);  Surgeon: [name], MD;  Location: [place];  Service: Open Heart Surgery;  Laterality: N/A;  . VASECTOMY  06/2002   Social History: Social History   Socioeconomic History  . Marital status: Married    Spouse name: Not on file  . Number of children: 5  . Years of education: Not on file  . Highest education level: Not on file  Occupational History  . Occupation: Scientist, forensic: [employer]  . Financial resource strain: Not on file  .  Food insecurity:    Worry: Not on file    Inability: Not on file  . Transportation needs:    Medical: Not on file    Non-medical: Not on file  Tobacco Use  . Smoking status: Never Smoker  . Smokeless tobacco: Never Used  Substance and Sexual Activity  . Alcohol use: Yes    Alcohol/week: 0.0 standard drinks     Comment: occ  . Drug use: No  . Sexual activity: Not on file  Lifestyle  . Physical activity:    Days per week: Not on file    Minutes per session: Not on file  . Stress: Not on file  Relationships  . Social connections:    Talks on phone: Not on file    Gets together: Not on file    Attends religious service: Not on file    Active member of club or organization: Not on file    Attends meetings of clubs or organizations: Not on file    Relationship status: Not on file  Other Topics Concern  . Not on file  Social History Narrative   The patient is married. He is originally from [place]. He works as an Art gallery manager. He does not smoke cigarettes or drink alcohol.   Family History: Family History  Problem Relation Age of Onset  . Diabetes Mother   . Cancer Mother        colon  . Diabetes Father   . Cancer Father        colon  . Colon cancer Father 29  . Diabetes Paternal Grandmother   . Cancer Paternal Grandmother   . Colon cancer Paternal Grandmother 105  . Cancer Daughter        leukemia  . Heart attack Maternal Grandmother   . Hypertension Maternal Grandmother   . Diabetes Maternal Grandmother   . Cancer Maternal Grandmother   . Diabetes Maternal Grandfather   . Cancer Maternal Grandfather   . Stroke Paternal Grandfather   . Diabetes Paternal Grandfather   . Cancer Paternal Grandfather    Allergies: No Known Allergies Medications: See med rec.  Review of Systems: No fevers, chills, night sweats, weight loss, chest pain, or shortness of breath.   Objective:    General: Well Developed, well nourished, and in no acute distress.  Neuro: Alert and oriented x3, extra-ocular muscles intact, sensation grossly intact.  HEENT: Normocephalic, atraumatic, pupils equal round reactive to light, neck supple, no masses, no lymphadenopathy, thyroid nonpalpable.  Skin: Warm and dry, no rashes. Cardiac: Regular rate and rhythm, no murmurs rubs or gallops, no lower  extremity edema.  Respiratory: Clear to auscultation bilaterally. Not using accessory muscles, speaking in full sentences.  Procedure:  Cryodestruction of right cheek skin tag Consent obtained and verified. Time-out conducted. Noted no overlying erythema, induration, or other signs of local infection. Completed without difficulty using Cryo-Gun. Advised to call if fevers/chills, erythema, induration, drainage, or persistent bleeding.  Impression and Recommendations:    Skin tag Good improvement, repeat cryotherapy and right-sided cheek skin tag.  Normocytic anemia Repeat hemoglobin, has been doing iron sulfate 3 times daily. Improved to 12.2. ___________________________________________ Gwen Her. Dianah Field, M.D., ABFM., CAQSM. Primary Care and Sports Medicine [facility]  Adjunct Professor of [employer]

## 2018-03-16 NOTE — Assessment & Plan Note (Signed)
Good improvement, repeat cryotherapy and right-sided cheek skin tag.

## 2018-03-24 DIAGNOSIS — L0501 Pilonidal cyst with abscess: Secondary | ICD-10-CM | POA: Diagnosis not present

## 2018-03-29 ENCOUNTER — Ambulatory Visit (INDEPENDENT_AMBULATORY_CARE_PROVIDER_SITE_OTHER): Payer: 59 | Admitting: Infectious Diseases

## 2018-03-29 ENCOUNTER — Encounter: Payer: Self-pay | Admitting: Infectious Diseases

## 2018-03-29 VITALS — BP 118/75 | HR 69 | Temp 98.3°F | Wt 182.0 lb

## 2018-03-29 DIAGNOSIS — I330 Acute and subacute infective endocarditis: Secondary | ICD-10-CM | POA: Diagnosis not present

## 2018-03-29 DIAGNOSIS — I38 Endocarditis, valve unspecified: Secondary | ICD-10-CM

## 2018-03-29 DIAGNOSIS — R7881 Bacteremia: Secondary | ICD-10-CM | POA: Diagnosis not present

## 2018-03-29 DIAGNOSIS — I76 Septic arterial embolism: Secondary | ICD-10-CM

## 2018-03-29 DIAGNOSIS — I749 Embolism and thrombosis of unspecified artery: Secondary | ICD-10-CM

## 2018-03-29 DIAGNOSIS — I442 Atrioventricular block, complete: Secondary | ICD-10-CM

## 2018-03-29 DIAGNOSIS — T826XXD Infection and inflammatory reaction due to cardiac valve prosthesis, subsequent encounter: Secondary | ICD-10-CM | POA: Diagnosis not present

## 2018-03-29 DIAGNOSIS — B957 Other staphylococcus as the cause of diseases classified elsewhere: Secondary | ICD-10-CM

## 2018-03-29 DIAGNOSIS — Z954 Presence of other heart-valve replacement: Secondary | ICD-10-CM

## 2018-03-29 DIAGNOSIS — Z952 Presence of prosthetic heart valve: Secondary | ICD-10-CM

## 2018-03-29 DIAGNOSIS — Z950 Presence of cardiac pacemaker: Secondary | ICD-10-CM

## 2018-03-29 NOTE — Assessment & Plan Note (Addendum)
Doing well following procedure and quickly returning to normal activities. Follow up with Dr. Roxy Manns and his team for ongoing care.  Counseled regarding the importance of daily dental hygiene, regular dental evaluations and cleanings, and the ongoing need for 2gm amoxicillin within 30-60 min for IE prophylaxis. Would add antistaphyloccal PCN vs cephalexin/doxy for invasive upper airway/respiratory or skin related procedures.

## 2018-03-29 NOTE — Assessment & Plan Note (Signed)
CNS infarcts were subclinical at time of diagnosis. Treated with prolonged antibiotics. No residual deficits or effect.

## 2018-03-29 NOTE — Assessment & Plan Note (Signed)
Secondary to prosthetic aortic valve endocarditis. Now s/p replacement and treatment. No signs of relapsed bacteremia since stopping antibiotics. Will repeat blood cultures today with prosthetic valve and PPM in place.

## 2018-03-29 NOTE — Progress Notes (Signed)
Patient: Patrick Walter  DOB: [birth year] MRN: [id number] PCP: [name], MD  Referring Provider: [facility]   Patient Active Problem List   Diagnosis Date Noted  . Skin tag 02/28/2018  . Foot swelling 02/09/2018  . S/P redo aortic valve replacement with human allograft aortic root graft 01/26/2018  . Pilonidal cyst   . Septic embolism ([place])   . Prosthetic valve endocarditis ([place])   . Staphylococcus epidermidis bacteremia   . Normocytic anemia 01/16/2018  . Hypercalcemia 12/07/2017  . Primary osteoarthritis of left hip 07/01/2016  . Ascending aorta enlargement ([place])   . Family history of cancer in father 12/27/2013  . Low back pain 07/24/2013  . Essential hypertension, benign 07/24/2013     Subjective:  CC:  Follow up prosthetic valve endocarditis s/p completion of antibiotics. No complaints today. Cyst is still causing some pain.    Brief ID Hx:  Patrick Walter is a 63 y.o. male with pmhx significant for aortic stenosis s/p aortic valve replacement in 2015 by Dr. Roxy Manns. Following his initial surgery he was doing very well and lost significant amount of weight with lifestyle changes including marathon training. He was hospitalized January 16, 2018 with a 1 month history of low-grade fevers, body aches and malaise with worsening course. He was febrile on presentation to ER with fever to 102 F, leukocytosis 16.8K and soft blood pressures. All blood culture bottles were positive for methicillin sensitive staphylococcus epidermidis. He was seen by Dr. Johnnye Sima in the hospital and started on cefazolin IV. TEE revealed vegetations involving his prosthetic valve with Rifampin added after this finding. MRI of the brain was assessed a few days into admission and revealed 2 areas of infarction d/t septic emboli from PVE (asymptomatic) and gentamicin was added for synergy until valve could be replaced. His blood cultures cleared and he was taken to surgery 01/26/18 for redo sternotomy  and AoV replacement with human allograft and repair of ascending thoracic aortic aneurysm. Post op he was changed to nafcillin + rifampin for better CNS penetration. Unfortunately he required PPM placement with post-op 3rd degree heart block ([place]). 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 This was excised 01/20/18 noted to be w/o signs of infection at this time; he was instructed to continue local wound care. He was discharged home on 02/01/18 to home with [facility] on IV cefazolin and oral rifampin therapy (nafcillin continuous infusion was declined by the patient d/t complexity). He completed antibiotics and had PICC line pulled out on 03/09/18.   HPI:  He is here today for 3 week follow up after stopping his antibiotic therapy for MSSE AoV endocarditis. He continues to do well. Has been released to drive and resume some physical activity. He is planning on more marathon training but feels that his pacing parameters need to be adjusted to help maximize his activity. He has had no fevers, chills, malaise, weight loss, night sweats. Very thankful that his PICC line is out now. Still working with [facility] about healing his excised cyst. No drainage and remaining opening is very small. He is asking about follow up dental recommendations for prophylactic antibiotics.   Review of Systems  Constitutional: Negative for chills, fever, malaise/fatigue and weight loss.  HENT: Negative for sore throat.        No dental problems  Respiratory: Negative for cough and sputum production.  Cardiovascular: Negative for chest pain and leg swelling.  Gastrointestinal: Negative for abdominal pain, diarrhea and vomiting.  Genitourinary: Negative for dysuria and flank pain.  Musculoskeletal: Negative for joint pain, myalgias and neck pain.  Skin: Negative for rash.    Neurological: Negative for dizziness, tingling and headaches.  Psychiatric/Behavioral: Negative for depression and substance abuse. The patient is not nervous/anxious and does not have insomnia.     Past Medical History:  Diagnosis Date  . Aortic stenosis   . Ascending aorta enlargement ([place])   . Bicuspid aortic valve   . Heart murmur   . Hypertension   . Patent foramen ovale    Closed at the time of aortic valve replacement  . Pilonidal cyst   . S/P aortic valve replacement with bioprosthetic valve 12/05/2013   25 mm [facility] Ease bovine pericardial tissue valve   . S/P redo aortic valve replacement with human allograft aortic root graft 01/26/2018  . Septic embolism (HCC)    Brain, spleen, superior mesenteric artery    Outpatient Medications Prior to Visit  Medication Sig Dispense Refill  . allopurinol (ZYLOPRIM) 300 MG tablet Take 1 tablet (300 mg total) by mouth daily. 30 tablet 3  . aspirin EC 81 MG tablet Take 81 mg by mouth daily.    . bisacodyl (DULCOLAX) 5 MG EC tablet Take 10 mg by mouth daily as needed for moderate constipation (take with iron).    . Boswell[place]-Glucosamine-Vit D (GLUCOSAMINE COMPLEX PO) Take 1 capsule by mouth daily.    . Coenzyme Q10 300 MG CAPS Take 1 capsule by mouth daily.    . ferrous [id number] C-folic acid (TRINSICON / FOLTRIN) capsule Take 1 capsule by mouth 3 (three) times daily after meals. 180 capsule 0  . Multiple Vitamin (MULTIVITAMIN WITH MINERALS) TABS tablet Take 1 tablet by mouth daily.    . Omega 3 1000 MG CAPS Take 3 capsules by mouth daily.    . Probiotic Product (PROBIOTIC PO) Take 1 capsule by mouth daily.    . TURMERIC PO Take 1 capsule by mouth daily.     No facility-administered medications prior to visit.      No Known Allergies  Social History   Tobacco Use  . Smoking status: Never Smoker  . Smokeless tobacco: Never Used  Substance Use Topics  . Alcohol use: Yes    Alcohol/week: 0.0 standard drinks     Comment: occ  . Drug use: No    Objective:   Vitals:   03/29/18 1425  BP: 118/75  Pulse: 69  Temp: 98.3 F (36.8 C)  TempSrc: Oral  Weight: 182 lb (82.6 kg)   Body mass index is 25.38 kg/m.  Physical Exam Vitals signs reviewed.  HENT:     Mouth/Throat:     Mouth: No oral lesions.     Dentition: Normal dentition. No dental caries.  Eyes:     General: No scleral icterus. Cardiovascular:     Rate and Rhythm: Normal rate and regular rhythm.     Heart sounds: Normal heart sounds.  Pulmonary:     Effort: Pulmonary effort is normal.     Breath sounds: Normal breath sounds.  Abdominal:     General: There is no distension.     Palpations: Abdomen is soft.     Tenderness: There is no abdominal tenderness.  Lymphadenopathy:     Cervical: No cervical adenopathy.  Skin:    General: Skin is warm and dry.     Findings: No rash.  Comments: Surgical incisions well healed. PPM pocked unremarkable.   Neurological:     Mental Status: He is alert and oriented to person, place, and time.    Lab Results: Lab Results  Component Value Date   WBC 9.5 02/10/2018   HGB 12.2 03/16/2018   HCT 28.0 (L) 02/10/2018   MCV 85.4 02/10/2018   PLT 522 (H) 02/10/2018    Lab Results  Component Value Date   CREATININE 0.91 02/10/2018   BUN 15 02/10/2018   NA 139 02/10/2018   K 4.4 02/10/2018   CL 104 02/10/2018   CO2 27 02/10/2018    Lab Results  Component Value Date   ALT 10 02/10/2018   AST 19 02/10/2018   ALKPHOS 107 02/01/2018   BILITOT 0.4 02/10/2018     Assessment & Plan:   Problem List Items Addressed This Visit      Unprioritized   Staphylococcus epidermidis bacteremia - Primary    Secondary to prosthetic aortic valve endocarditis. Now s/p replacement and treatment. No signs of relapsed bacteremia since stopping antibiotics. Will repeat blood cultures today with prosthetic valve and PPM in place.       Relevant Orders   Blood culture (routine single)   Culture,  blood (single) w Reflex to ID Panel   Prosthetic valve endocarditis ([place])    Doing well following procedure and quickly returning to normal activities. Follow up with Dr. Roxy Manns and his team for ongoing care.  Counseled regarding the importance of daily dental hygiene, regular dental evaluations and cleanings, and the ongoing need for 2gm amoxicillin within 30-60 min for IE prophylaxis. Would add antistaphyloccal PCN vs cephalexin/doxy for invasive upper airway/respiratory or skin related procedures.       Septic embolism (HCC)    CNS infarcts were subclinical at time of diagnosis. Treated with prolonged antibiotics. No residual deficits or effect.       S/P redo aortic valve replacement with human allograft aortic root graft     He will be notified with blood culture results once finalized. Explained this will take 7 days from time of draw.   [name], MSN, NP-C [facility] [place] Pager: [phone] Office: [phone]  03/31/18  9:13 PM

## 2018-03-29 NOTE — Patient Instructions (Signed)
I am confident that your infection has been cured with surgery and antibiotics.   Will call you with final results of your blood work today (will be 7 days until they are finalized).   Plan on follow up with your primary medical team and Dr. Roxy Manns if all is well - it was a pleasure to meet you and care for you.

## 2018-04-01 ENCOUNTER — Encounter: Payer: Self-pay | Admitting: Sports Medicine

## 2018-04-06 ENCOUNTER — Telehealth: Payer: Self-pay

## 2018-04-06 LAB — CULTURE, BLOOD (SINGLE)
MICRO NUMBER: 28732
MICRO NUMBER:: 28733
RESULT: NO GROWTH
Result:: NO GROWTH
SPECIMEN QUALITY: ADEQUATE
SPECIMEN QUALITY: ADEQUATE

## 2018-04-06 NOTE — Telephone Encounter (Signed)
Per [name], np called patient to relay lab cultures. Was able to speak with [name] and go over results. Patient did not have questions about lab work during call.  [place]

## 2018-04-06 NOTE — Telephone Encounter (Signed)
-----   Message from Thorp Callas, NP sent at 04/05/2018  4:59 PM EST ----- Please call Patrick Walter to let him know that his blood cultures were completely care and seems his infection is cured.  I hope he continues to feel better and good luck with his next marathon!

## 2018-04-13 ENCOUNTER — Encounter: Payer: Self-pay | Admitting: Internal Medicine

## 2018-04-17 ENCOUNTER — Ambulatory Visit: Payer: 59 | Admitting: Cardiovascular Disease

## 2018-05-03 ENCOUNTER — Encounter: Payer: 59 | Admitting: Internal Medicine

## 2018-05-03 ENCOUNTER — Ambulatory Visit (INDEPENDENT_AMBULATORY_CARE_PROVIDER_SITE_OTHER): Payer: 59 | Admitting: Internal Medicine

## 2018-05-03 ENCOUNTER — Encounter: Payer: Self-pay | Admitting: Internal Medicine

## 2018-05-03 VITALS — BP 110/76 | HR 71 | Ht 71.0 in | Wt 183.0 lb

## 2018-05-03 DIAGNOSIS — I442 Atrioventricular block, complete: Secondary | ICD-10-CM | POA: Diagnosis not present

## 2018-05-03 DIAGNOSIS — Z950 Presence of cardiac pacemaker: Secondary | ICD-10-CM

## 2018-05-03 NOTE — Progress Notes (Signed)
PCP: [name], MD Primary Cardiologist: Dr Burt Knack Primary EP:  Dr Rayann Heman  Patrick Walter is a 63 y.o. male who presents today for routine electrophysiology followup.  Since last being seen in our clinic, the patient reports doing very well. He is exercising regularly, without difficulty. Today, he denies symptoms of palpitations, chest pain, shortness of breath,  lower extremity edema, dizziness, presyncope, or syncope.  The patient is otherwise without complaint today.   Past Medical History:  Diagnosis Date  . Aortic stenosis   . Ascending aorta enlargement ([place])   . Bicuspid aortic valve   . Heart murmur   . Hypertension   . Patent foramen ovale    Closed at the time of aortic valve replacement  . Pilonidal cyst   . S/P aortic valve replacement with bioprosthetic valve 12/05/2013   25 mm [facility] Ease bovine pericardial tissue valve   . S/P redo aortic valve replacement with human allograft aortic root graft 01/26/2018  . Septic embolism (HCC)    Brain, spleen, superior mesenteric artery   Past Surgical History:  Procedure Laterality Date  . AORTIC VALVE REPLACEMENT N/A 12/05/2013   Procedure: AORTIC VALVE REPLACEMENT (AVR);  Surgeon: [name], MD;  Location: [place];  Service: Open Heart Surgery;  Laterality: N/A;  . AORTIC VALVE REPLACEMENT N/A 01/26/2018   Procedure: REDO STERNOTOMY, REDO AORTIC VALVE REPLACEMENT WITH HUMAN ALLOGRAFT INCLUDING AORTIC ROOT REPLACEMENT,  REPAIR ASCENDING THORACIC AORTIC ANEURYSM;  Surgeon: [name], MD;  Location: [place];  Service: Open Heart Surgery;  Laterality: N/A;  . HAND SURGERY Left 07/1977  . INTRAOPERATIVE TRANSESOPHAGEAL ECHOCARDIOGRAM N/A 12/05/2013   Procedure: INTRAOPERATIVE TRANSESOPHAGEAL ECHOCARDIOGRAM;  Surgeon: [name], MD;  Location: [place];  Service: Open Heart Surgery;  Laterality: N/A;  . LEFT AND RIGHT HEART CATHETERIZATION WITH CORONARY ANGIOGRAM N/A 11/08/2013   Procedure: LEFT AND  RIGHT HEART CATHETERIZATION WITH CORONARY ANGIOGRAM;  Surgeon: Blane Ohara, MD;  Location: [facility] CATH LAB;  Service: Cardiovascular;  Laterality: N/A;  . PACEMAKER IMPLANT N/A 01/31/2018   Procedure: PACEMAKER IMPLANT;  Surgeon: Thompson Grayer, MD;  Location: [place] CV LAB;  Service: Cardiovascular;  Laterality: N/A;  . PATENT FORAMEN OVALE CLOSURE N/A 12/05/2013   Procedure: PATENT FORAMEN OVALE CLOSURE;  Surgeon: [name], MD;  Location: [place];  Service: Open Heart Surgery;  Laterality: N/A;  . PILONIDAL CYST / SINUS EXCISION  01/20/2018   Dr Brantley Stage  . TEE WITHOUT CARDIOVERSION N/A 10/23/2013   Procedure: TRANSESOPHAGEAL ECHOCARDIOGRAM (TEE);  Surgeon: Larey Dresser, MD;  Location: [place];  Service: Cardiovascular;  Laterality: N/A;  . TEE WITHOUT CARDIOVERSION N/A 01/19/2018   Procedure: TRANSESOPHAGEAL ECHOCARDIOGRAM (TEE);  Surgeon: Sanda Klein, MD;  Location: [place];  Service: Cardiovascular;  Laterality: N/A;  . TEE WITHOUT CARDIOVERSION N/A 01/26/2018   Procedure: TRANSESOPHAGEAL ECHOCARDIOGRAM (TEE);  Surgeon: [name], MD;  Location: [place];  Service: Open Heart Surgery;  Laterality: N/A;  . VASECTOMY  06/2002    ROS- all systems are reviewed and negative except as per HPI above  Current Outpatient Medications  Medication Sig Dispense Refill  . allopurinol (ZYLOPRIM) 300 MG tablet Take 1 tablet (300 mg total) by mouth daily. 30 tablet 3  . aspirin EC 81 MG tablet Take 81 mg by mouth daily.    . bisacodyl (DULCOLAX) 5 MG EC tablet Take 10 mg by mouth daily as needed for moderate constipation (take with iron).    Marland Kitchen  Boswellia-Glucosamine-Vit D (GLUCOSAMINE COMPLEX PO) Take 1 capsule by mouth daily.    . Coenzyme Q10 300 MG CAPS Take 1 capsule by mouth daily.    . Multiple Vitamin (MULTIVITAMIN WITH MINERALS) TABS tablet Take 1 tablet by mouth daily.    . Omega 3 1000 MG CAPS Take 3 capsules by mouth daily.    . Probiotic Product (PROBIOTIC PO) Take  1 capsule by mouth daily.    . TURMERIC PO Take 1 capsule by mouth daily.     No current facility-administered medications for this visit.     Physical Exam: Vitals:   05/03/18 1356  BP: 110/76  Pulse: 71  SpO2: 98%  Weight: 183 lb (83 kg)  Height: 5\' 11"  (1.803 m)    GEN- The patient is well appearing, alert and oriented x 3 today.   Head- normocephalic, atraumatic Eyes-  Sclera clear, conjunctiva pink Ears- hearing intact Oropharynx- clear Lungs- Clear to ausculation bilaterally, normal work of breathing Chest- pacemaker pocket is well healed Heart- Regular rate and rhythm, no murmurs, rubs or gallops, PMI not laterally displaced GI- soft, NT, ND, + BS Extremities- no clubbing, cyanosis, or edema  Pacemaker interrogation- reviewed in detail today,  See PACEART report  ekg tracing ordered today is personally reviewed and shows sinus with V pacing  Assessment and Plan:  1. Symptomatic second degree heart block Improved from prior complete heart block Normal pacemaker function See Pace Art report VIP turned on Tracking rate increased to 150 bpm per patient request  2. S/p bioprosthetic AVR Doing well  [place] Return to see EP NP in a year  Thompson Grayer MD, [facility] 05/03/2018 2:11 PM

## 2018-05-03 NOTE — Patient Instructions (Addendum)
Medication Instructions:  Your physician recommends that you continue on your current medications as directed. Please refer to the Current Medication list given to you today.  Labwork: None ordered.  Testing/Procedures: None ordered.  Follow-Up: Your physician wants you to follow-up in: one year with Chanetta Marshall, NP.   You will receive a reminder letter in the mail two months in advance. If you don't receive a letter, please call our office to schedule the follow-up appointment.  Remote monitoring is used to monitor your Pacemaker from home. This monitoring reduces the number of office visits required to check your device to one time per year. It allows [place] to keep an eye on the functioning of your device to ensure it is working properly. You are scheduled for a device check from home on 08/02/2018. You may send your transmission at any time that day. If you have a wireless device, the transmission will be sent automatically. After your physician reviews your transmission, you will receive a postcard with your next transmission date.  Any Other Special Instructions Will Be Listed Below (If Applicable).  If you need a refill on your cardiac medications before your next appointment, please call your pharmacy.

## 2018-05-16 LAB — CUP PACEART INCLINIC DEVICE CHECK
Date Time Interrogation Session: 20200225145840
Implantable Lead Implant Date: 20191112
Implantable Lead Location: 753859
MDC IDC LEAD IMPLANT DT: 20191112
MDC IDC LEAD LOCATION: 753860
MDC IDC PG IMPLANT DT: 20191112
MDC IDC PG SERIAL: 9086757

## 2018-05-19 ENCOUNTER — Other Ambulatory Visit: Payer: Self-pay | Admitting: Sports Medicine

## 2018-05-19 DIAGNOSIS — M25552 Pain in left hip: Secondary | ICD-10-CM

## 2018-05-23 ENCOUNTER — Ambulatory Visit (HOSPITAL_COMMUNITY): Payer: 59 | Attending: Cardiovascular Disease

## 2018-05-23 DIAGNOSIS — Z953 Presence of xenogenic heart valve: Secondary | ICD-10-CM | POA: Diagnosis not present

## 2018-06-01 ENCOUNTER — Telehealth: Payer: Self-pay

## 2018-06-01 NOTE — Progress Notes (Signed)
Left voice message for the patient to callback for lab results.

## 2018-06-01 NOTE — Telephone Encounter (Signed)
Left voice message for the patient to call back for ECHO results. 

## 2018-06-05 ENCOUNTER — Telehealth: Payer: Self-pay

## 2018-06-05 NOTE — Telephone Encounter (Signed)
Left voice message for the patient to call back for the ECHO results.

## 2018-06-05 NOTE — Progress Notes (Signed)
Left voice message for the patient to call back for the ECHO results.

## 2018-06-07 ENCOUNTER — Telehealth: Payer: Self-pay

## 2018-06-07 NOTE — Progress Notes (Signed)
Left voice message for the patient to call back for ECHO results. 

## 2018-06-07 NOTE — Telephone Encounter (Signed)
Left voice message for the patient to call back for ECHO results. 

## 2018-06-13 NOTE — Progress Notes (Signed)
Letter sent out for patient to call office for results

## 2018-08-02 ENCOUNTER — Ambulatory Visit (INDEPENDENT_AMBULATORY_CARE_PROVIDER_SITE_OTHER): Payer: 59 | Admitting: *Deleted

## 2018-08-02 ENCOUNTER — Other Ambulatory Visit: Payer: Self-pay

## 2018-08-02 DIAGNOSIS — I442 Atrioventricular block, complete: Secondary | ICD-10-CM

## 2018-08-03 LAB — CUP PACEART REMOTE DEVICE CHECK
Battery Remaining Longevity: 120 mo
Battery Remaining Percentage: 95 %
Battery Voltage: 3.01 V
Brady Statistic RA Percent Paced: 49 %
Brady Statistic RV Percent Paced: 65 %
Date Time Interrogation Session: 20200514152134
Implantable Lead Implant Date: 20191112
Implantable Lead Implant Date: 20191112
Implantable Lead Location: 753859
Implantable Lead Location: 753860
Implantable Pulse Generator Implant Date: 20191112
Lead Channel Impedance Value: 450 Ohm
Lead Channel Impedance Value: 450 Ohm
Lead Channel Impedance Value: 610 Ohm
Lead Channel Pacing Threshold Amplitude: 0.5 V
Lead Channel Pacing Threshold Amplitude: 0.625 V
Lead Channel Pacing Threshold Pulse Width: 0.5 ms
Lead Channel Pacing Threshold Pulse Width: 0.5 ms
Lead Channel Sensing Intrinsic Amplitude: 12 mV
Lead Channel Sensing Intrinsic Amplitude: 3.8 mV
Lead Channel Setting Pacing Amplitude: 0.75 V
Lead Channel Setting Pacing Amplitude: 1.5 V
Lead Channel Setting Pacing Pulse Width: 0.5 ms
Lead Channel Setting Sensing Sensitivity: 3.5 mV
Pulse Gen Model: 2272
Pulse Gen Serial Number: 9086757

## 2018-08-22 ENCOUNTER — Encounter: Payer: Self-pay | Admitting: Sports Medicine

## 2018-08-22 NOTE — Progress Notes (Signed)
Remote pacemaker transmission.   

## 2018-08-23 MED ORDER — AMOXICILLIN 500 MG PO TABS: ORAL_TABLET | ORAL | 3 refills | Status: DC

## 2018-08-23 MED ORDER — AMOXICILLIN 500 MG PO TABS: ORAL_TABLET | ORAL | 0 refills | Status: DC

## 2018-10-10 ENCOUNTER — Encounter: Payer: Self-pay | Admitting: Sports Medicine

## 2018-10-10 MED ORDER — AMOXICILLIN 500 MG PO TABS: ORAL_TABLET | ORAL | 3 refills | Status: DC

## 2018-11-02 ENCOUNTER — Ambulatory Visit (INDEPENDENT_AMBULATORY_CARE_PROVIDER_SITE_OTHER): Payer: 59 | Admitting: *Deleted

## 2018-11-02 DIAGNOSIS — I442 Atrioventricular block, complete: Secondary | ICD-10-CM

## 2018-11-03 LAB — CUP PACEART REMOTE DEVICE CHECK
Battery Remaining Longevity: 122 mo
Battery Remaining Percentage: 95.5 %
Battery Voltage: 3.01 V
Brady Statistic AP VP Percent: 52 %
Brady Statistic AP VS Percent: 1 %
Brady Statistic AS VP Percent: 19 %
Brady Statistic AS VS Percent: 28 %
Brady Statistic RA Percent Paced: 52 %
Brady Statistic RV Percent Paced: 71 %
Date Time Interrogation Session: 20200812060012
Implantable Lead Implant Date: 20191112
Implantable Lead Implant Date: 20191112
Implantable Lead Location: 753859
Implantable Lead Location: 753860
Implantable Pulse Generator Implant Date: 20191112
Lead Channel Impedance Value: 480 Ohm
Lead Channel Impedance Value: 610 Ohm
Lead Channel Pacing Threshold Amplitude: 0.5 V
Lead Channel Pacing Threshold Amplitude: 0.625 V
Lead Channel Pacing Threshold Pulse Width: 0.5 ms
Lead Channel Pacing Threshold Pulse Width: 0.5 ms
Lead Channel Sensing Intrinsic Amplitude: 12 mV
Lead Channel Sensing Intrinsic Amplitude: 3.7 mV
Lead Channel Setting Pacing Amplitude: 0.875
Lead Channel Setting Pacing Amplitude: 1.5 V
Lead Channel Setting Pacing Pulse Width: 0.5 ms
Lead Channel Setting Sensing Sensitivity: 3.5 mV
Pulse Gen Model: 2272
Pulse Gen Serial Number: 9086757

## 2018-11-13 ENCOUNTER — Encounter: Payer: Self-pay | Admitting: Cardiology

## 2018-11-13 NOTE — Progress Notes (Signed)
Remote pacemaker transmission.   

## 2018-11-20 ENCOUNTER — Encounter: Payer: Self-pay | Admitting: Sports Medicine

## 2018-11-24 IMAGING — CT CT ANGIO CHEST-ABD-PELV FOR DISSECTION W/ AND WO/W CM
1 of 3 series · 12 of 32 positions shown, 16 images · IV contrast (APPLIED)
Comparison: Cardiac CT dated November 30, 2016. CT chest dated
December 15, 2015.

CLINICAL DATA: Chest and back pain. Prosthetic valve endocarditis.
Evaluate for emboli.

EXAM:
CT ANGIOGRAPHY CHEST, ABDOMEN AND PELVIS
TECHNIQUE: Multidetector CT imaging through the chest, abdomen and pelvis was
performed using the standard protocol during bolus administration of
intravenous contrast. Multiplanar reconstructed images and MIPs were
obtained and reviewed to evaluate the vascular anatomy.
CONTRAST:  100mL UHM7HG-RXG IOPAMIDOL (UHM7HG-RXG) INJECTION 76%

[Series 7: arterial · axial · arterial · 0.80mm/px · z∈[+817,+1447]mm · 12 of 355 slices shown, 16 images]
[im 20/355  soft-tissue]
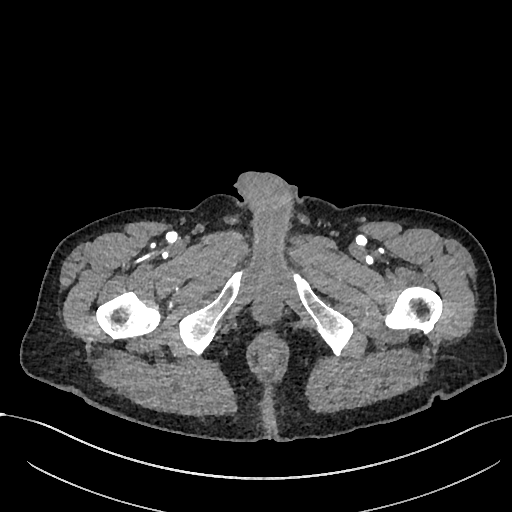
[im 20/355  bone]
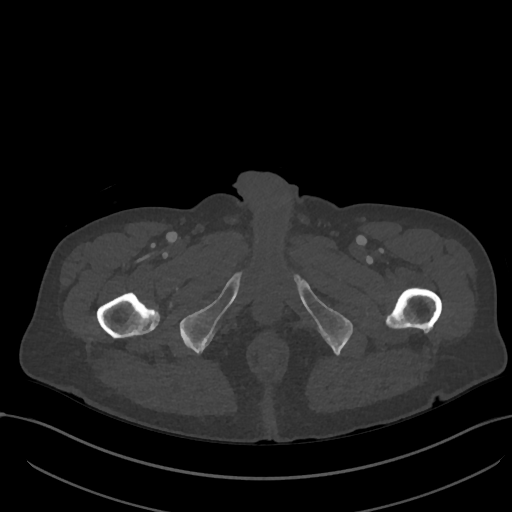
[im 60/355  soft-tissue]
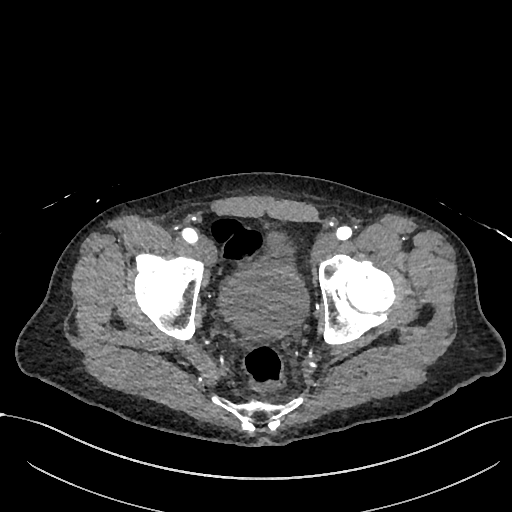
[im 99/355  soft-tissue]
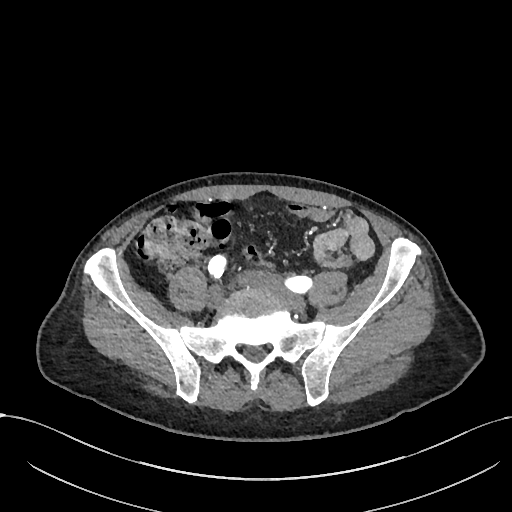
[im 119/355  soft-tissue]
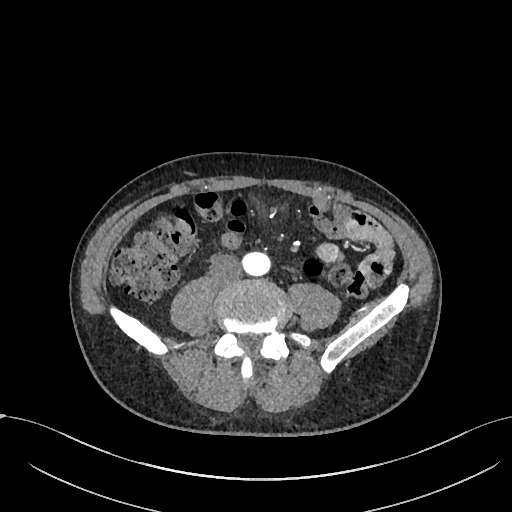
[im 158/355  soft-tissue]
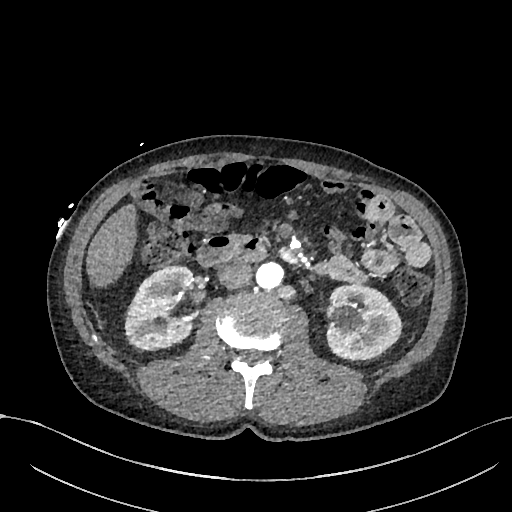
[im 197/355  soft-tissue]
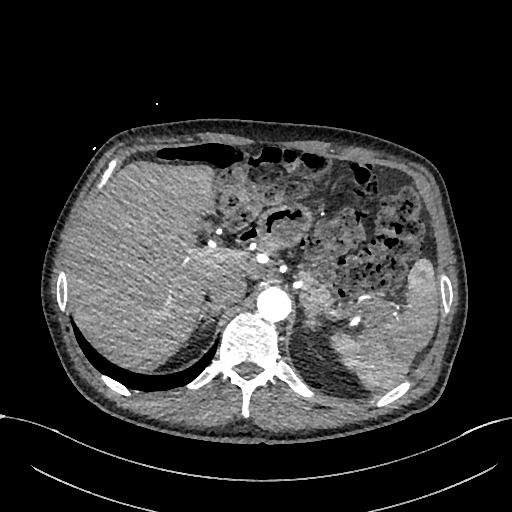
[im 237/355  soft-tissue]
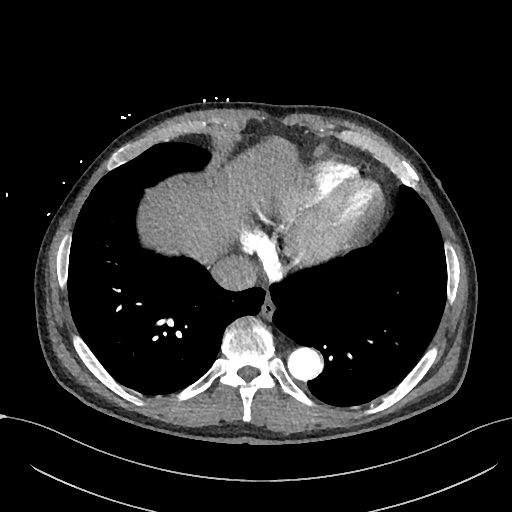
[im 256/355  soft-tissue]
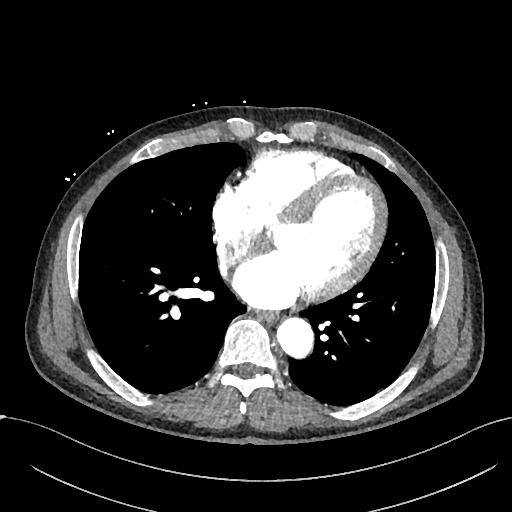
[im 276/355  lung]
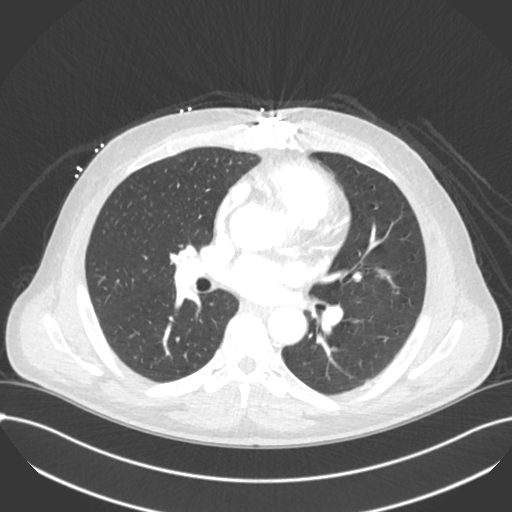
[im 296/355  soft-tissue]
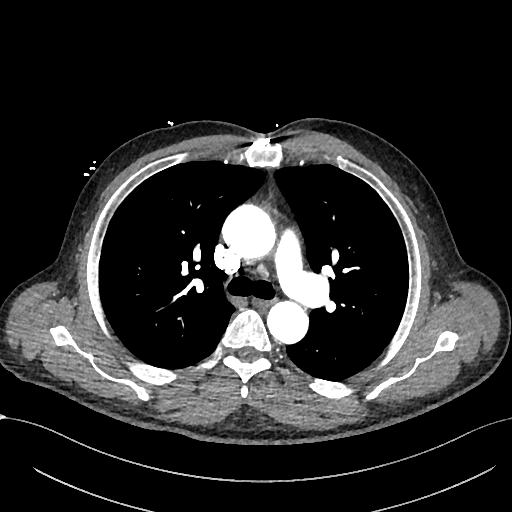
[im 296/355  lung]
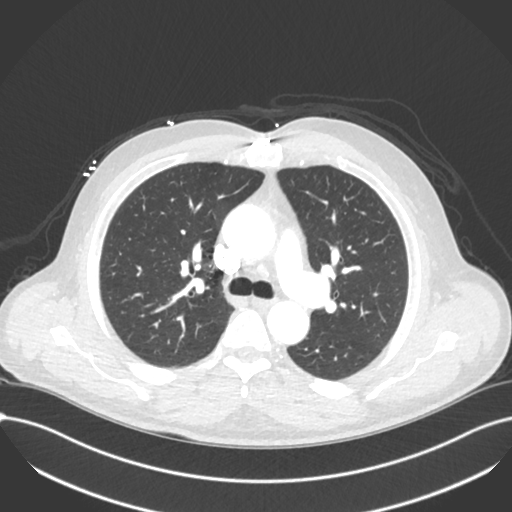
[im 296/355  bone]
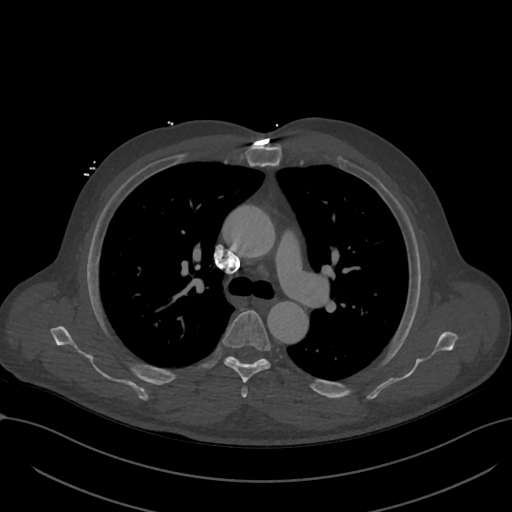
[im 315/355  lung]
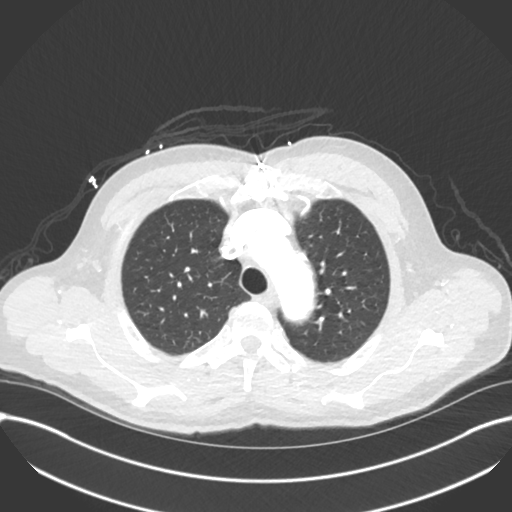
[im 335/355  soft-tissue]
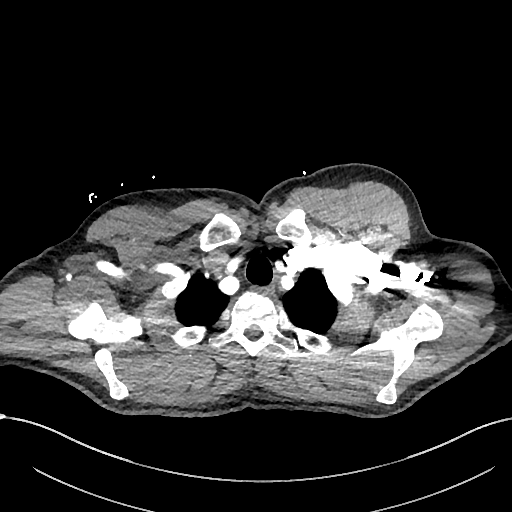
[im 335/355  lung]
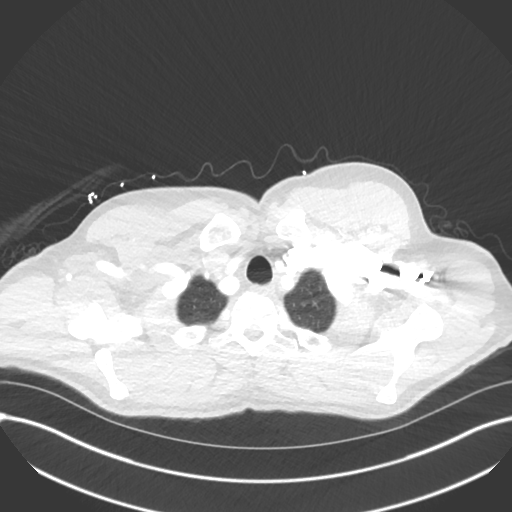

[12 of 32 positions shown; findings below may reference images not displayed]

FINDINGS: CTA CHEST FINDINGS

Cardiovascular: Unchanged aneurysmal dilatation of the ascending
thoracic aorta, measuring up to 4.2 cm. No aortic dissection. No
intramural hematoma on noncontrast images. No pulmonary embolism.
Stable mild cardiomegaly. No pericardial effusion. Prior aortic
valve replacement. Coronary artery atherosclerosis.

Mediastinum/Nodes: No enlarged mediastinal, hilar, or axillary lymph
nodes. Thyroid gland, trachea, and esophagus demonstrate no
significant findings.

Lungs/Pleura: Lungs are clear. No pleural effusion or pneumothorax.

Musculoskeletal: No chest wall abnormality. No acute or significant
osseous findings.

Review of the MIP images confirms the above findings.

CTA ABDOMEN AND PELVIS FINDINGS

VASCULAR

Aorta: Normal caliber aorta without aneurysm, dissection, vasculitis
or significant stenosis.

Celiac: Patent without evidence of aneurysm, dissection, vasculitis
or significant stenosis. Abrupt occlusion of the distal splenic
artery (series 7, image 162). Replaced left hepatic artery arising
from the left gastric artery.

SMA: Posterior luminal irregularity of the mid SMA. Abrupt occlusion
of the SMA just distal to the origin of the right colic artery
(series 11, image 122).

Renals: Both renal arteries are patent without evidence of aneurysm,
dissection, vasculitis, fibromuscular dysplasia or significant
stenosis.

IMA: Patent without evidence of aneurysm, dissection, vasculitis or
significant stenosis.

Inflow: Patent without evidence of aneurysm, dissection, vasculitis
or significant stenosis.

Veins: Possible occluded branch of the SMV (series 7, image 200),
although evaluation is limited on this arterial phase study.

Review of the MIP images confirms the above findings.

NON-VASCULAR

Hepatobiliary: No focal liver abnormality is seen. Gallbladder is
contracted. No gallstones or biliary dilatation.

Pancreas: Unremarkable. No pancreatic ductal dilatation or
surrounding inflammatory changes.

Spleen: Multiple wedge-shaped areas of hypoenhancement within the
spleen, consistent with infarcts.

Adrenals/Urinary Tract: The adrenal glands are unremarkable.
Wedge-shaped hypodensity in the peripheral mid pole of the left
kidney. Small nonobstructive calculus in the lower pole of the left
kidney. 2.3 cm exophytic left renal simple cyst. Small chronic
cortical defect in the upper pole of the right kidney. The right
kidney is otherwise unremarkable. No hydronephrosis. The bladder is
unremarkable.

Stomach/Bowel: Stomach is within normal limits. Appendix appears
normal. No evidence of bowel wall thickening, distention, or
inflammatory changes. Redundant sigmoid colon.

Lymphatic: No enlarged abdominal or pelvic lymph nodes.

Reproductive: Prostate is unremarkable.

Other: 3.0 x 1.8 cm area of ill-defined mesenteric soft tissue
density in the mid abdomen just distal to the occluded distal SMA.
No ascites or pneumoperitoneum.

Musculoskeletal: No acute or significant osseous findings.

Review of the MIP images confirms the above findings.
IMPRESSION: Vascular:

1. Abrupt embolic occlusion of the SMA just distal to the right
colic artery origin. Vascular surgery consultation is recommended.
2. Posterior luminal irregularity of the mid SMA could reflect
nonocclusive emboli or atherosclerotic plaque.
3. Abrupt embolic occlusion of the distal splenic artery with
multiple splenic infarcts.
4. Small left renal infarct.
5. Possibly occluded branch of the SMV, although evaluation is
limited on this arterial phase study. Consider portal venous phase
CT of the abdomen and pelvis for further evaluation.
6. Unchanged 4.2 cm ascending thoracic aortic aneurysm. Recommend
annual imaging followup by CTA or MRA. This recommendation follows
3000 ACCF/AHA/AATS/ACR/ASA/SCA/BM/WIN/SETH/TAYNAN Guidelines for the
Diagnosis and Management of Patients with Thoracic Aortic Disease.
Circulation. 3000; 121: [id number]

Non-vascular:

1. No evidence of ischemic bowel.
2. 3.0 x 1.8 cm area of ill-defined mesenteric soft tissue density
in the mid abdomen near the occluded distal SMA branches may be
inflammatory related to adjacent vessel occlusion.
3. Nonobstructive left nephrolithiasis.
4. No acute intrathoracic process.

Critical Value/emergent results were called by telephone at the time
of interpretation on 01/21/2018 at [DATE] to Dr. SHUO CHAVIANO,
who verbally acknowledged these results.

## 2019-02-01 ENCOUNTER — Ambulatory Visit (INDEPENDENT_AMBULATORY_CARE_PROVIDER_SITE_OTHER): Payer: 59 | Admitting: *Deleted

## 2019-02-01 DIAGNOSIS — I442 Atrioventricular block, complete: Secondary | ICD-10-CM | POA: Diagnosis not present

## 2019-02-01 LAB — CUP PACEART REMOTE DEVICE CHECK
Battery Remaining Longevity: 116 mo
Battery Remaining Percentage: 95.5 %
Battery Voltage: 3.01 V
Brady Statistic AP VP Percent: 50 %
Brady Statistic AP VS Percent: 1 %
Brady Statistic AS VP Percent: 21 %
Brady Statistic AS VS Percent: 27 %
Brady Statistic RA Percent Paced: 51 %
Brady Statistic RV Percent Paced: 71 %
Date Time Interrogation Session: 20201112050951
Implantable Lead Implant Date: 20191112
Implantable Lead Implant Date: 20191112
Implantable Lead Location: 753859
Implantable Lead Location: 753860
Implantable Pulse Generator Implant Date: 20191112
Lead Channel Impedance Value: 460 Ohm
Lead Channel Impedance Value: 640 Ohm
Lead Channel Pacing Threshold Amplitude: 0.625 V
Lead Channel Pacing Threshold Amplitude: 0.875 V
Lead Channel Pacing Threshold Pulse Width: 0.5 ms
Lead Channel Pacing Threshold Pulse Width: 0.5 ms
Lead Channel Sensing Intrinsic Amplitude: 12 mV
Lead Channel Sensing Intrinsic Amplitude: 4.6 mV
Lead Channel Setting Pacing Amplitude: 1.125
Lead Channel Setting Pacing Amplitude: 1.625
Lead Channel Setting Pacing Pulse Width: 0.5 ms
Lead Channel Setting Sensing Sensitivity: 3.5 mV
Pulse Gen Model: 2272
Pulse Gen Serial Number: 9086757

## 2019-02-23 NOTE — Progress Notes (Signed)
Remote pacemaker transmission.   

## 2019-03-12 ENCOUNTER — Ambulatory Visit: Payer: 59 | Admitting: Thoracic Surgery (Cardiothoracic Vascular Surgery)

## 2019-03-19 ENCOUNTER — Telehealth (INDEPENDENT_AMBULATORY_CARE_PROVIDER_SITE_OTHER): Payer: 59 | Admitting: Thoracic Surgery (Cardiothoracic Vascular Surgery)

## 2019-03-19 ENCOUNTER — Other Ambulatory Visit: Payer: Self-pay

## 2019-03-19 DIAGNOSIS — Z954 Presence of other heart-valve replacement: Secondary | ICD-10-CM | POA: Diagnosis not present

## 2019-03-19 NOTE — Progress Notes (Signed)
[address]       [place] [address]             [phone]     CARDIOTHORACIC SURGERY TELEPHONE VIRTUAL OFFICE NOTE  Primary Cardiologist is [name], MD  Primary Electrophysiologist is Thompson Grayer, MD PCP is [name], MD   HPI:  I spoke with Patrick Walter (DOB Nov 29, 1955 ) via telephone on 03/19/2019 at 2:04 PM and verified that I was speaking with the correct person using more than one form of identification.  We discussed the reason(s) for conducting our visit virtually instead of in-person.  The patient expressed understanding the circumstances and agreed to proceed as described.   Patient is a 63 year old male status post aortic valve replacement using a bioprosthetic tissue valve in 2015 for bicuspid aortic valve disease with severe symptomatic aortic stenosis who underwent redo aortic valve replacement using a human allograft aortic root with reimplantation of the left main and right coronary arteries and resection and grafting of the ascending thoracic aorta on January 26, 2018 for Staphylococcus epidermidis.  Prior to surgery the patient underwent excision of a recurrent pilonidal cyst may or may not have been the original source of bacterial endocarditis.  The patient's early postoperative recovery was notable for the development of complete heart block for which he underwent permanent pacemaker placement.  He has otherwise done quite well and was last seen here in our office on March 06, 2018.  I spoke with the patient over the telephone today and he reports that he is doing exceptionally well.  He exercises regularly and is overall quite pleased although he hopes to have his peak heart rate on his pacemaker increased.  He has no symptoms of exertional shortness of breath but when his heart rate gets up over 150 bpm he begins to feel dizzy.  He otherwise is doing exceptionally well and he reports no complaints.  Most recent follow-up  echocardiogram performed May 23, 2018 revealed normal left ventricular systolic function with normal appearance of the allograft aortic valve.  There was no aortic insufficiency and mean transvalvular gradient was estimated 7 mmHg.  There was mildly reduced right ventricular function and abnormal septal motion consistent with paced rhythm.   Current Outpatient Medications  Medication Sig Dispense Refill  . allopurinol (ZYLOPRIM) 300 MG tablet Take 1 tablet (300 mg total) by mouth daily. 30 tablet 3  . amoxicillin (AMOXIL) 500 MG tablet 4 tabs (2000mg ) 1 hours prior to procedure 4 tablet 3  . aspirin EC 81 MG tablet Take 81 mg by mouth daily.    . bisacodyl (DULCOLAX) 5 MG EC tablet Take 10 mg by mouth daily as needed for moderate constipation (take with iron).    . Boswellia-Glucosamine-Vit D (GLUCOSAMINE COMPLEX PO) Take 1 capsule by mouth daily.    . celecoxib (CELEBREX) 200 MG capsule Take 1-2 capsules (200-400 mg total) by mouth daily as needed for mild pain. 180 capsule 3  . Coenzyme Q10 300 MG CAPS Take 1 capsule by mouth daily.    . Multiple Vitamin (MULTIVITAMIN WITH MINERALS) TABS tablet Take 1 tablet by mouth daily.    . Omega 3 1000 MG CAPS Take 3 capsules by mouth daily.    . Probiotic Product (PROBIOTIC PO) Take 1 capsule by mouth daily.    . TURMERIC PO Take 1 capsule by mouth daily.     No current facility-administered medications for this visit.     Diagnostic Tests:  ECHOCARDIOGRAM REPORT         Patient Name:   Patrick Walter Date of Exam: 05/23/2018 Medical Rec #:  [id number]       Height:       71.0 in Accession #:    [id number]      Weight:       183.0 lb Date of Birth:  March 28, 1955       BSA:          2.03 m Patient Age:    30 years        BP:           110/76 mmHg Patient Gender: M               HR:           73 bpm. Exam Location:  [facility]     Procedure: 2D Echo, 3D Echo, Color Doppler, Cardiac Doppler and Strain Analysis   Indications:    Z95.3  AVR   History:        Patient has prior history of Echocardiogram examinations, most                 recent 12/07/2016. Endocarditis; Risk Factors: Hypertension and                 Anemia. Aortic Valve: A unknown Human allograft including aortic                 root replacement, repair ascending thoracic aortic aneurysm                 Procedure Date: 01/26/2018   Sonographer:    Basilia Jumbo RDCS Referring Phys: [id number] [place]      1. The left ventricle has low normal systolic function, with an ejection fraction of 50-55%. The cavity size was mildly dilated. Left ventricular diastolic parameters were normal There is abnormal septal motion consistent with RV pacemaker.  2. The right ventricle has mildly reduced systolic function. The cavity was mildly enlarged. There is no increase in right ventricular wall thickness. Right ventricular systolic pressure is normal with an estimated pressure of 25.8 mmHg.  3. Left atrial size was mildly dilated.  4. Right atrial size was mildly dilated.  5. The mitral valve is normal in structure. Mild thickening of the mitral valve leaflet. There is mild mitral annular calcification present. Mitral valve regurgitation is mild to moderate by color flow Doppler. The MR jet is eccentric medially directed.  6. The tricuspid valve is normal in structure.  7. A Human allograft valve is present in the aortic position (including aortic root replacement, repair ascending thoracic aortic aneurysm; Procedure Date: 01/26/2018).  8. Normal aortic valve prosthesis. Peak and mean gradients are 12 and 7 mm Hg, respectively.  9. Aortic valve regurgitation is trivial by color flow Doppler. 10. The pulmonic valve was normal in structure. 11. The aortic root and ascending aorta are normal in size and structure.   FINDINGS  Left Ventricle: The left ventricle has low normal systolic function, with an ejection fraction of 50-55%. The cavity size was mildly  dilated. There is no increase in left ventricular wall thickness. Left ventricular diastolic parameters were normal  Normal left ventricular filling pressures There is abnormal (paradoxical) septal motion, consistent with RV pacemaker. Right Ventricle: The right ventricle has mildly reduced systolic function. The cavity was mildly enlarged. There is no increase in right ventricular wall thickness. Right ventricular systolic pressure is  normal with an estimated pressure of 25.8 mmHg. Left Atrium: left atrial size was mildly dilated Right Atrium: right atrial size was mildly dilated. Right atrial pressure is estimated at 8 mmHg. pacemaker lead is present. Pacemaker lead is present. Interatrial Septum: No atrial level shunt detected by color flow Doppler. Pericardium: There is no evidence of pericardial effusion. Mitral Valve: The mitral valve is normal in structure. Mild thickening of the mitral valve leaflet. There is mild mitral annular calcification present. Mitral valve regurgitation is mild to moderate by color flow Doppler. The MR jet is eccentric medially  directed. Tricuspid Valve: The tricuspid valve is normal in structure. Tricuspid valve regurgitation is trivial by color flow Doppler. Aortic Valve: The aortic valve has been repaired/replaced Aortic valve regurgitation is trivial by color flow Doppler. There is no evidence of aortic valve stenosis. A unknown Human allograft including aortic root replacement, repair ascending thoracic  aortic aneurysm valve is present in the aortic position. Procedure Date: 01/26/2018 Normal aortic valve prosthesis. Pulmonic Valve: The pulmonic valve was normal in structure. Pulmonic valve regurgitation is trivial by color flow Doppler. Aorta: The aortic root and ascending aorta are normal in size and structure. Venous: The inferior vena cava is normal in size with greater than 50% respiratory variability. Compared to previous exam: 12/07/16 EF 55-60%. AV 32mmHg  mean, 59mmHg peak.   LEFT VENTRICLE PLAX 2D (Teich) LV EF:          55.4 %   Diastology LVIDd:          5.80 cm  LV e' lateral:   10.60 cm/s LVIDs:          4.10 cm  LV E/e' lateral: 7.3 LV PW:          1.10 cm  LV e' medial:    5.22 cm/s LV IVS:         1.10 cm  LV E/e' medial:  14.8 LVOT diam:      2.30 cm LV SV:          92 ml    2D Longitudinal Strain LVOT Area:      4.15 cm 2D Strain GLS (A2C):   -15.3 %                          2D Strain GLS (A3C):   -16.9 %                          2D Strain GLS (A4C):   -13.3 %                          2D Strain GLS Avg:     -15.2 %                            3D Volume EF:                          3D EF:        50 %                          LV EDV:       240 ml  LV ESV:       121 ml                          LV SV:        119 ml   RIGHT VENTRICLE RV S prime:     7.72 cm/s TAPSE (M-mode): 1.1 cm RVSP:           25.8 mmHg   LEFT ATRIUM             Index       RIGHT ATRIUM LA diam:        4.60 cm 2.27 cm/m  RA Pressure: 8 mmHg LA Vol (A2C):   64.4 ml 31.72 ml/m LA Vol (A4C):   53.4 ml 26.30 ml/m LA Biplane Vol: 59.9 ml 29.50 ml/m  AORTIC VALVE AV Area (Vmax):    2.79 cm AV Area (Vmean):   2.59 cm AV Area (VTI):     2.64 cm AV Vmax:           174.00 cm/s AV Vmean:          129.000 cm/s AV VTI:            0.417 m AV Peak Grad:      12.1 mmHg AV Mean Grad:      7.0 mmHg LVOT Vmax:         117.00 cm/s LVOT Vmean:        80.400 cm/s LVOT VTI:          0.265 m LVOT/AV VTI ratio: 0.64 AR PHT:            581 msec   AORTA Ao Root diam: 3.70 cm Ao Asc diam:  4.30 cm   MITRAL VALVE              TRICUSPID VALVE                           TR Peak grad:   17.8 mmHg                           TR Vmax:        230.00 cm/s MV Decel Time: 222 msec   RVSP:           25.8 mmHg MV E velocity: 77.40 cm/s MV A velocity: 79.90 cm/s MV E/A ratio:  0.97     Mihai Croitoru MD Electronically signed by Sanda Klein  MD Signature Date/Time: 05/23/2018/8:56:35 AM     Impression:  Patient is doing very well approximately 1 year status post redo aortic valve replacement using human allograft aortic root graft for prosthetic valve endocarditis.   Plan:  We have not recommended any change the patient's current medications.  The patient will continue to follow-up regularly with Dr. Burt Knack and Dr. Rayann Heman and return to our office in the future only should specific problems or questions arise.  All questions answered.    I discussed limitations of evaluation and management via telephone.  The patient was advised to call back for repeat telephone consultation or to seek an in-person evaluation if questions arise or the patient's clinical condition changes in any significant manner.  I spent in excess of 5 minutes of non-face-to-face time during the conduct of this telephone virtual office consultation.     Valentina Gu. Roxy Manns, MD 03/19/2019 2:04 PM

## 2019-03-19 NOTE — Patient Instructions (Signed)
Continue all previous medications without any changes at this time  

## 2019-05-03 ENCOUNTER — Ambulatory Visit (INDEPENDENT_AMBULATORY_CARE_PROVIDER_SITE_OTHER): Payer: 59 | Admitting: *Deleted

## 2019-05-03 DIAGNOSIS — I442 Atrioventricular block, complete: Secondary | ICD-10-CM

## 2019-05-03 LAB — CUP PACEART REMOTE DEVICE CHECK
Battery Remaining Longevity: 125 mo
Battery Remaining Percentage: 95.5 %
Battery Voltage: 3.01 V
Brady Statistic AP VP Percent: 50 %
Brady Statistic AP VS Percent: 1 %
Brady Statistic AS VP Percent: 28 %
Brady Statistic AS VS Percent: 21 %
Brady Statistic RA Percent Paced: 50 %
Brady Statistic RV Percent Paced: 78 %
Date Time Interrogation Session: 20210211092308
Implantable Lead Implant Date: 20191112
Implantable Lead Implant Date: 20191112
Implantable Lead Location: 753859
Implantable Lead Location: 753860
Implantable Pulse Generator Implant Date: 20191112
Lead Channel Impedance Value: 510 Ohm
Lead Channel Impedance Value: 640 Ohm
Lead Channel Pacing Threshold Amplitude: 0.75 V
Lead Channel Pacing Threshold Amplitude: 0.75 V
Lead Channel Pacing Threshold Pulse Width: 0.5 ms
Lead Channel Pacing Threshold Pulse Width: 0.5 ms
Lead Channel Sensing Intrinsic Amplitude: 12 mV
Lead Channel Sensing Intrinsic Amplitude: 5 mV
Lead Channel Setting Pacing Amplitude: 1 V
Lead Channel Setting Pacing Amplitude: 1.75 V
Lead Channel Setting Pacing Pulse Width: 0.5 ms
Lead Channel Setting Sensing Sensitivity: 3.5 mV
Pulse Gen Model: 2272
Pulse Gen Serial Number: 9086757

## 2019-05-04 NOTE — Progress Notes (Signed)
PPM Remote  

## 2019-06-27 DIAGNOSIS — M25552 Pain in left hip: Secondary | ICD-10-CM

## 2019-06-27 MED ORDER — CELECOXIB 200 MG PO CAPS: 200.0000 mg | ORAL_CAPSULE | Freq: Every day | ORAL | 3 refills | Status: DC | PRN

## 2019-08-02 ENCOUNTER — Ambulatory Visit (INDEPENDENT_AMBULATORY_CARE_PROVIDER_SITE_OTHER): Payer: 59 | Admitting: *Deleted

## 2019-08-02 DIAGNOSIS — I442 Atrioventricular block, complete: Secondary | ICD-10-CM

## 2019-08-02 LAB — CUP PACEART REMOTE DEVICE CHECK
Battery Remaining Longevity: 124 mo
Battery Remaining Percentage: 95.5 %
Battery Voltage: 3.01 V
Brady Statistic AP VP Percent: 49 %
Brady Statistic AP VS Percent: 1 %
Brady Statistic AS VP Percent: 33 %
Brady Statistic AS VS Percent: 17 %
Brady Statistic RA Percent Paced: 49 %
Brady Statistic RV Percent Paced: 82 %
Date Time Interrogation Session: 20210513042256
Implantable Lead Implant Date: 20191112
Implantable Lead Implant Date: 20191112
Implantable Lead Location: 753859
Implantable Lead Location: 753860
Implantable Pulse Generator Implant Date: 20191112
Lead Channel Impedance Value: 400 Ohm
Lead Channel Impedance Value: 560 Ohm
Lead Channel Pacing Threshold Amplitude: 0.5 V
Lead Channel Pacing Threshold Amplitude: 0.625 V
Lead Channel Pacing Threshold Pulse Width: 0.5 ms
Lead Channel Pacing Threshold Pulse Width: 0.5 ms
Lead Channel Sensing Intrinsic Amplitude: 12 mV
Lead Channel Sensing Intrinsic Amplitude: 4.4 mV
Lead Channel Setting Pacing Amplitude: 0.875
Lead Channel Setting Pacing Amplitude: 1.5 V
Lead Channel Setting Pacing Pulse Width: 0.5 ms
Lead Channel Setting Sensing Sensitivity: 3.5 mV
Pulse Gen Model: 2272
Pulse Gen Serial Number: 9086757

## 2019-08-06 NOTE — Progress Notes (Signed)
Remote pacemaker transmission.   

## 2019-09-19 ENCOUNTER — Other Ambulatory Visit: Payer: Self-pay

## 2019-09-19 ENCOUNTER — Ambulatory Visit (INDEPENDENT_AMBULATORY_CARE_PROVIDER_SITE_OTHER): Payer: 59 | Admitting: Sports Medicine

## 2019-09-19 DIAGNOSIS — M1612 Unilateral primary osteoarthritis, left hip: Secondary | ICD-10-CM | POA: Diagnosis not present

## 2019-09-19 DIAGNOSIS — N400 Enlarged prostate without lower urinary tract symptoms: Secondary | ICD-10-CM

## 2019-09-19 DIAGNOSIS — Z809 Family history of malignant neoplasm, unspecified: Secondary | ICD-10-CM

## 2019-09-19 DIAGNOSIS — Z0000 Encounter for general adult medical examination without abnormal findings: Secondary | ICD-10-CM

## 2019-09-19 DIAGNOSIS — E210 Primary hyperparathyroidism: Secondary | ICD-10-CM

## 2019-09-19 DIAGNOSIS — E782 Mixed hyperlipidemia: Secondary | ICD-10-CM

## 2019-09-19 NOTE — Assessment & Plan Note (Signed)
Last annual physical was in 2018, he needs to reschedule this.

## 2019-09-19 NOTE — Assessment & Plan Note (Signed)
Colonoscopy back in 2016 revealed only diverticulosis, due to a family history of colon cancer it was recommended that he have another colonoscopy in 5 years, his gastroenterologist retired, I am going to set him up with Dr. Bryan Lemma.

## 2019-09-19 NOTE — Progress Notes (Addendum)
    Procedures performed today:    Procedure: Real-time Ultrasound Guided injection of the left hip joint Device: Samsung [id number]  Verbal informed consent obtained.  Time-out conducted.  Noted no overlying erythema, induration, or other signs of local infection.  Skin prepped in a sterile fashion.  Local anesthesia: Topical Ethyl chloride.  With sterile technique and under real time ultrasound guidance: 1 cc Kenalog 40, 2 cc lidocaine, 2 cc bupivacaine injected easily Completed without difficulty  Pain immediately resolved suggesting accurate placement of the medication.  Advised to call if fevers/chills, erythema, induration, drainage, or persistent bleeding.  Images permanently stored and available for review in the ultrasound unit.  Impression: Technically successful ultrasound guided injection.  Independent interpretation of notes and tests performed by another provider:   None.  Brief History, Exam, Impression, and Recommendations:    Family history of cancer in father Colonoscopy back in 2016 revealed only diverticulosis, due to a family history of colon cancer it was recommended that he have another colonoscopy in 48 years, his gastroenterologist retired, I am going to set him up with Dr. Bryan Lemma.  Primary osteoarthritis of left hip Increasing hip joint pain, last injection was in 2019, repeat hip joint injection today, return to see me as needed for this.  Annual physical exam Last annual physical was in 2018, he needs to reschedule this.  Primary hyperparathyroidism (HCC) Rechecking calcium levels. He does have low vitamin D, he will get his calcium levels drawn before restarting vitamin D supplementation.  Markedly elevated parathyroid hormone level, and elevated calcium, referral endocrinology, he likely has a benign parathyroid tumor.  Hold off on vitamin D supplementation for now.  Mixed hyperlipidemia Elevated, declines statin  treatment.    ___________________________________________ Gwen Her. Dianah Field, M.D., ABFM., CAQSM. Primary Care and [place] [facility] [employer]

## 2019-09-19 NOTE — Assessment & Plan Note (Signed)
Increasing hip joint pain, last injection was in 2019, repeat hip joint injection today, return to see me as needed for this.

## 2019-09-20 ENCOUNTER — Encounter: Payer: Self-pay | Admitting: Gastroenterology

## 2019-09-21 LAB — CBC
HCT: 45.7 % (ref 38.5–50.0)
Hemoglobin: 15.1 g/dL (ref 13.2–17.1)
MCH: 28.9 pg (ref 27.0–33.0)
MCHC: 33 g/dL (ref 32.0–36.0)
MCV: 87.4 fL (ref 80.0–100.0)
MPV: 11.9 fL (ref 7.5–12.5)
Platelets: 254 10*3/uL (ref 140–400)
RBC: 5.23 10*6/uL (ref 4.20–5.80)
RDW: 12.2 % (ref 11.0–15.0)
WBC: 8.7 10*3/uL (ref 3.8–10.8)

## 2019-09-21 LAB — COMPLETE METABOLIC PANEL WITH GFR
AG Ratio: 1.6 (calc) (ref 1.0–2.5)
ALT: 17 U/L (ref 9–46)
AST: 16 U/L (ref 10–35)
Albumin: 4.8 g/dL (ref 3.6–5.1)
Alkaline phosphatase (APISO): 63 U/L (ref 35–144)
BUN/Creatinine Ratio: 28 (calc) — ABNORMAL HIGH (ref 6–22)
BUN: 26 mg/dL — ABNORMAL HIGH (ref 7–25)
CO2: 26 mmol/L (ref 20–32)
Calcium: 11.4 mg/dL — ABNORMAL HIGH (ref 8.6–10.3)
Chloride: 109 mmol/L (ref 98–110)
Creat: 0.93 mg/dL (ref 0.70–1.25)
GFR, Est African American: 102 mL/min/{1.73_m2} (ref >=60)
GFR, Est Non African American: 88 mL/min/{1.73_m2} (ref >=60)
Globulin: 3 g/dL (calc) (ref 1.9–3.7)
Glucose, Bld: 86 mg/dL (ref 65–99)
Potassium: 4.3 mmol/L (ref 3.5–5.3)
Sodium: 141 mmol/L (ref 135–146)
Total Bilirubin: 0.7 mg/dL (ref 0.2–1.2)
Total Protein: 7.8 g/dL (ref 6.1–8.1)

## 2019-09-21 LAB — LIPID PANEL W/REFLEX DIRECT LDL
Cholesterol: 224 mg/dL — ABNORMAL HIGH (ref <200)
HDL: 66 mg/dL (ref >=40)
LDL Cholesterol (Calc): 142 mg/dL (calc) — ABNORMAL HIGH
Non-HDL Cholesterol (Calc): 158 mg/dL (calc) — ABNORMAL HIGH (ref <130)
Total CHOL/HDL Ratio: 3.4 (calc) (ref <5.0)
Triglycerides: 69 mg/dL (ref <150)

## 2019-09-21 LAB — TSH: TSH: 2.22 mIU/L (ref 0.40–4.50)

## 2019-09-21 LAB — VITAMIN D 25 HYDROXY (VIT D DEFICIENCY, FRACTURES): Vit D, 25-Hydroxy: 22 ng/mL — ABNORMAL LOW (ref 30–100)

## 2019-09-25 LAB — PSA, TOTAL AND FREE
PSA, % Free: 25 % (calc) — ABNORMAL LOW (ref >25)
PSA, Free: 0.1 ng/mL
PSA, Total: 0.4 ng/mL (ref <=4.0)

## 2019-09-26 DIAGNOSIS — E782 Mixed hyperlipidemia: Secondary | ICD-10-CM | POA: Insufficient documentation

## 2019-09-26 MED ORDER — VITAMIN D (ERGOCALCIFEROL) 1.25 MG (50000 UNIT) PO CAPS: 50000.0000 [IU] | ORAL_CAPSULE | ORAL | 0 refills | Status: DC

## 2019-09-26 NOTE — Assessment & Plan Note (Addendum)
Rechecking calcium levels. He does have low vitamin D, he will get his calcium levels drawn before restarting vitamin D supplementation.  Markedly elevated parathyroid hormone level, and elevated calcium, referral endocrinology, he likely has a benign parathyroid tumor.  Hold off on vitamin D supplementation for now.

## 2019-09-26 NOTE — Addendum Note (Signed)
Addended by: [name] on: 09/26/2019 12:03 PM   Modules accepted: Orders

## 2019-09-26 NOTE — Assessment & Plan Note (Signed)
Elevated, declines statin treatment.

## 2019-09-27 DIAGNOSIS — E210 Primary hyperparathyroidism: Secondary | ICD-10-CM

## 2019-09-27 LAB — PTH, INTACT AND CALCIUM
Calcium: 10.9 mg/dL — ABNORMAL HIGH (ref 8.6–10.3)
PTH: 103 pg/mL — ABNORMAL HIGH (ref 14–64)

## 2019-09-27 LAB — CALCIUM, IONIZED: Calcium, Ion: 5.8 mg/dL — ABNORMAL HIGH (ref 4.8–5.6)

## 2019-09-27 NOTE — Addendum Note (Signed)
Addended by: [name] on: 09/27/2019 11:55 AM   Modules accepted: Orders

## 2019-10-17 ENCOUNTER — Other Ambulatory Visit: Payer: Self-pay

## 2019-10-17 ENCOUNTER — Ambulatory Visit (AMBULATORY_SURGERY_CENTER): Payer: Self-pay | Admitting: *Deleted

## 2019-10-17 ENCOUNTER — Telehealth: Payer: Self-pay | Admitting: *Deleted

## 2019-10-17 VITALS — Ht 71.0 in | Wt 197.0 lb

## 2019-10-17 DIAGNOSIS — Z800 Family history of malignant neoplasm of digestive organs: Secondary | ICD-10-CM

## 2019-10-17 MED ORDER — SUTAB 1479-225-188 MG PO TABS: 1.0000 | ORAL_TABLET | Freq: Once | ORAL | 0 refills | Status: AC

## 2019-10-17 NOTE — Telephone Encounter (Signed)
Dr. Bryan Lemma,  I just saw this patient in [place] for upcoming colonoscopy for FH hx colon cancer.  He has a history of 2 aortic valve replacements.  He is concerned about infection and asked about pre procedure antibiotics.  Please advise.   Thank you, Lanelle Bal

## 2019-10-17 NOTE — Progress Notes (Signed)
No egg or soy allergy known to patient  No issues with past sedation with any surgeries or procedures no intubation problems in the past  No diet pills per patient No home 02 use per patient  No blood thinners per patient  Pt denies issues with constipation  No A fib or A flutter  EMMI video to pt or MyChart  COVID 19 guidelines implemented in [facility] today   Suprep Coupon given to pt in [facility] today   Due to the COVID-19 pandemic we are asking patients to follow these guidelines. Please only bring one care partner. Please be aware that your care partner may wait in the car in the parking lot or if they feel like they will be too hot to wait in the car, they may wait in the lobby on the 4th floor. All care partners are required to wear a mask the entire time (we do not have any that we can provide them), they need to practice social distancing, and we will do a Covid check for all patient's and care partners when you arrive. Also we will check their temperature and your temperature. If the care partner waits in their car they need to stay in the parking lot the entire time and we will call them on their cell phone when the patient is ready for discharge so they can bring the car to the front of the building. Also all patient's will need to wear a mask into building.  

## 2019-10-18 ENCOUNTER — Encounter: Payer: Self-pay | Admitting: Gastroenterology

## 2019-10-18 NOTE — Telephone Encounter (Signed)
Informed pt per Dr Bryan Lemma no need to treat with abx prior- pt verbalized understanding

## 2019-10-18 NOTE — Telephone Encounter (Signed)
Current data states no need for peri-op Abx. Thanks.

## 2019-10-28 ENCOUNTER — Other Ambulatory Visit: Payer: Self-pay | Admitting: Sports Medicine

## 2019-11-01 ENCOUNTER — Ambulatory Visit (INDEPENDENT_AMBULATORY_CARE_PROVIDER_SITE_OTHER): Payer: 59 | Admitting: *Deleted

## 2019-11-01 DIAGNOSIS — I442 Atrioventricular block, complete: Secondary | ICD-10-CM | POA: Diagnosis not present

## 2019-11-04 LAB — CUP PACEART REMOTE DEVICE CHECK
Battery Remaining Longevity: 125 mo
Battery Remaining Percentage: 95.5 %
Battery Voltage: 3.01 V
Brady Statistic AP VP Percent: 48 %
Brady Statistic AP VS Percent: 1 %
Brady Statistic AS VP Percent: 34 %
Brady Statistic AS VS Percent: 17 %
Brady Statistic RA Percent Paced: 48 %
Brady Statistic RV Percent Paced: 82 %
Date Time Interrogation Session: 20210814190307
Implantable Lead Implant Date: 20191112
Implantable Lead Implant Date: 20191112
Implantable Lead Location: 753859
Implantable Lead Location: 753860
Implantable Pulse Generator Implant Date: 20191112
Lead Channel Impedance Value: 400 Ohm
Lead Channel Impedance Value: 590 Ohm
Lead Channel Pacing Threshold Amplitude: 0.5 V
Lead Channel Pacing Threshold Amplitude: 0.5 V
Lead Channel Pacing Threshold Pulse Width: 0.5 ms
Lead Channel Pacing Threshold Pulse Width: 0.5 ms
Lead Channel Sensing Intrinsic Amplitude: 12 mV
Lead Channel Sensing Intrinsic Amplitude: 4.3 mV
Lead Channel Setting Pacing Amplitude: 0.75 V
Lead Channel Setting Pacing Amplitude: 1.5 V
Lead Channel Setting Pacing Pulse Width: 0.5 ms
Lead Channel Setting Sensing Sensitivity: 3.5 mV
Pulse Gen Model: 2272
Pulse Gen Serial Number: 9086757

## 2019-11-05 NOTE — Progress Notes (Signed)
Remote pacemaker transmission.   

## 2019-11-09 ENCOUNTER — Other Ambulatory Visit: Payer: Self-pay

## 2019-11-09 ENCOUNTER — Ambulatory Visit (AMBULATORY_SURGERY_CENTER): Payer: 59 | Admitting: Gastroenterology

## 2019-11-09 ENCOUNTER — Encounter: Payer: Self-pay | Admitting: Gastroenterology

## 2019-11-09 VITALS — BP 116/78 | HR 60 | Temp 97.8°F | Resp 12 | Ht 71.0 in | Wt 197.0 lb

## 2019-11-09 DIAGNOSIS — K640 First degree hemorrhoids: Secondary | ICD-10-CM

## 2019-11-09 DIAGNOSIS — Z1211 Encounter for screening for malignant neoplasm of colon: Secondary | ICD-10-CM | POA: Diagnosis not present

## 2019-11-09 DIAGNOSIS — Z800 Family history of malignant neoplasm of digestive organs: Secondary | ICD-10-CM

## 2019-11-09 DIAGNOSIS — K621 Rectal polyp: Secondary | ICD-10-CM

## 2019-11-09 DIAGNOSIS — D128 Benign neoplasm of rectum: Secondary | ICD-10-CM

## 2019-11-09 DIAGNOSIS — K5730 Diverticulosis of large intestine without perforation or abscess without bleeding: Secondary | ICD-10-CM

## 2019-11-09 MED ORDER — SODIUM CHLORIDE 0.9 % IV SOLN: 500.0000 mL | Freq: Once | INTRAVENOUS | Status: DC

## 2019-11-09 NOTE — Progress Notes (Signed)
PT taken to PACU. Monitors in place. VSS. Report given to RN. 

## 2019-11-09 NOTE — Patient Instructions (Signed)
Please read handouts provided. Continue present medications. Await pathology results. Repeat colonoscopy in 5 years. Return to GI clinic as needed.      YOU HAD AN ENDOSCOPIC PROCEDURE TODAY AT [facility]:   Refer to the procedure report that was given to you for any specific questions about what was found during the examination.  If the procedure report does not answer your questions, please call your gastroenterologist to clarify.  If you requested that your care partner not be given the details of your procedure findings, then the procedure report has been included in a sealed envelope for you to review at your convenience later.  YOU SHOULD EXPECT: Some feelings of bloating in the abdomen. Passage of more gas than usual.  Walking can help get rid of the air that was put into your GI tract during the procedure and reduce the bloating. If you had a lower endoscopy (such as a colonoscopy or flexible sigmoidoscopy) you may notice spotting of blood in your stool or on the toilet paper. If you underwent a bowel prep for your procedure, you may not have a normal bowel movement for a few days.  Please Note:  You might notice some irritation and congestion in your nose or some drainage.  This is from the oxygen used during your procedure.  There is no need for concern and it should clear up in a day or so.  SYMPTOMS TO REPORT IMMEDIATELY:   Following lower endoscopy (colonoscopy or flexible sigmoidoscopy):  Excessive amounts of blood in the stool  Significant tenderness or worsening of abdominal pains  Swelling of the abdomen that is new, acute  Fever of 100F or higher   For urgent or emergent issues, a gastroenterologist can be reached at any hour by calling [phone]. Do not use MyChart messaging for urgent concerns.    DIET:  We do recommend a small meal at first, but then you may proceed to your regular diet.  Drink plenty of fluids but you should avoid alcoholic  beverages for 24 hours.  ACTIVITY:  You should plan to take it easy for the rest of today and you should NOT DRIVE or use heavy machinery until tomorrow (because of the sedation medicines used during the test).    FOLLOW UP: Our staff will call the number listed on your records 48-72 hours following your procedure to check on you and address any questions or concerns that you may have regarding the information given to you following your procedure. If we do not reach you, we will leave a message.  We will attempt to reach you two times.  During this call, we will ask if you have developed any symptoms of COVID 19. If you develop any symptoms (ie: fever, flu-like symptoms, shortness of breath, cough etc.) before then, please call [phone].  If you test positive for Covid 19 in the 2 weeks post procedure, please call and report this information to [place].    If any biopsies were taken you will be contacted by phone or by letter within the next 1-3 weeks.  Please call us at [phone] if you have not heard about the biopsies in 3 weeks.    SIGNATURES/CONFIDENTIALITY: You and/or your care partner have signed paperwork which will be entered into your electronic medical record.  These signatures attest to the fact that that the information above on your After Visit Summary has been reviewed and is understood.  Full responsibility of the confidentiality of this  discharge information lies with you and/or your care-partner. 

## 2019-11-09 NOTE — Op Note (Signed)
[facility] Patient Name: Zan Orlick Procedure Date: 11/09/2019 9:02 AM MRN: [id number] Endoscopist: Gerrit Heck , MD Age: 64 Referring MD:  Date of Birth: [birth year] Gender: Male Account #: [id number] Procedure:                Colonoscopy Indications:              Colon cancer screening in patient at increased                            risk: Colorectal cancer in father, Colon cancer                            screening in patient at increased risk: Family                            history of colorectal cancer in multiple 2nd degree                            relatives                           Colonoscopy in 05/2014 with sigmooid diverticulosis,                            but otherwise unremarkable, with recommendation to                            repeat in 5 years due to Fhx of CRC (father and                            paternal grandmother). Medicines:                Monitored Anesthesia Care Procedure:                Pre-Anesthesia Assessment:                           - Prior to the procedure, a History and Physical                            was performed, and patient medications and                            allergies were reviewed. The patient's tolerance of                            previous anesthesia was also reviewed. The risks                            and benefits of the procedure and the sedation                            options and risks were discussed with the patient.  All questions were answered, and informed consent                            was obtained. Prior Anticoagulants: The patient has                            taken no previous anticoagulant or antiplatelet                            agents. ASA Grade Assessment: II - A patient with                            mild systemic disease. After reviewing the risks                            and benefits, the patient was deemed in                             satisfactory condition to undergo the procedure.                           After obtaining informed consent, the colonoscope                            was passed under direct vision. Throughout the                            procedure, the patient's blood pressure, pulse, and                            oxygen saturations were monitored continuously. The                            Colonoscope was introduced through the anus and                            advanced to the the cecum, identified by                            appendiceal orifice and ileocecal valve. The                            colonoscopy was performed without difficulty. The                            patient tolerated the procedure well. The quality                            of the bowel preparation was adequate. The                            ileocecal valve, appendiceal orifice, and rectum  were photographed. Scope In: 9:04:21 AM Scope Out: 9:25:56 AM Scope Withdrawal Time: 0 hours 16 minutes 15 seconds  Total Procedure Duration: 0 hours 21 minutes 35 seconds  Findings:                 The perianal and digital rectal examinations were                            normal.                           A 2 mm polyp was found in the rectum. The polyp was                            sessile. The polyp was removed with a cold biopsy                            forceps. Resection and retrieval were complete.                            Estimated blood loss was minimal.                           A single small-mouthed diverticulum was found in                            the sigmoid colon.                           The exam was otherwise normal throughout the                            remainder of the colon.                           Non-bleeding internal hemorrhoids were found during                            retroflexion. The hemorrhoids were small. Complications:            No immediate  complications. Estimated Blood Loss:     Estimated blood loss was minimal. Impression:               - One 2 mm polyp in the rectum, removed with a cold                            biopsy forceps. Resected and retrieved.                           - Diverticulosis in the sigmoid colon.                           - Non-bleeding internal hemorrhoids. Recommendation:           - Patient has a contact number available for  emergencies. The signs and symptoms of potential                            delayed complications were discussed with the                            patient. Return to normal activities tomorrow.                            Written discharge instructions were provided to the                            patient.                           - Resume previous diet.                           - Continue present medications.                           - Await pathology results.                           - Repeat colonoscopy in 5 years for surveillance.                           - Return to GI clinic PRN. Gerrit Heck, MD 11/09/2019 9:40:00 AM

## 2019-11-09 NOTE — Progress Notes (Signed)
Called to room to assist during endoscopic procedure.  Patient ID and intended procedure confirmed with present staff. Received instructions for my participation in the procedure from the performing physician.  

## 2019-11-09 NOTE — Progress Notes (Signed)
Pt's states no medical or surgical changes since previsit or office visit.  CW - vitals 

## 2019-11-13 ENCOUNTER — Telehealth: Payer: Self-pay | Admitting: *Deleted

## 2019-11-13 NOTE — Telephone Encounter (Signed)
  Follow up Call-  Call back number 11/09/2019  Post procedure Call Back phone  # [phone]  Permission to leave phone message Yes  Some recent data might be hidden     No answer at 2nd attempt follow up phone call.  Unable to leave message on voicemail d/t full mailbox.

## 2019-11-13 NOTE — Telephone Encounter (Signed)
Unable to leave message on f/u call, mailbox full

## 2019-11-15 ENCOUNTER — Encounter: Payer: Self-pay | Admitting: Gastroenterology

## 2019-12-12 ENCOUNTER — Ambulatory Visit (INDEPENDENT_AMBULATORY_CARE_PROVIDER_SITE_OTHER): Payer: 59 | Admitting: Sports Medicine

## 2019-12-12 ENCOUNTER — Encounter: Payer: Self-pay | Admitting: Sports Medicine

## 2019-12-12 VITALS — BP 130/83 | HR 76 | Ht 71.0 in | Wt 196.0 lb

## 2019-12-12 DIAGNOSIS — Z0000 Encounter for general adult medical examination without abnormal findings: Secondary | ICD-10-CM | POA: Diagnosis not present

## 2019-12-12 DIAGNOSIS — Z23 Encounter for immunization: Secondary | ICD-10-CM | POA: Diagnosis not present

## 2019-12-12 DIAGNOSIS — E210 Primary hyperparathyroidism: Secondary | ICD-10-CM

## 2019-12-12 DIAGNOSIS — I10 Essential (primary) hypertension: Secondary | ICD-10-CM

## 2019-12-12 DIAGNOSIS — E782 Mixed hyperlipidemia: Secondary | ICD-10-CM

## 2019-12-12 NOTE — Assessment & Plan Note (Signed)
Routine physical as above, flu shot today.

## 2019-12-12 NOTE — Assessment & Plan Note (Signed)
Patrick Walter lipids were fairly elevated, we are going to recheck them today, he has been doing a low-cholesterol diet for the past couple of months, if still elevated I did advise him that I would again suggest a statin treatment.

## 2019-12-12 NOTE — Progress Notes (Signed)
Subjective:    CC: Annual Physical Exam  HPI:  This patient is here for their annual physical  I reviewed the past medical history, family history, social history, surgical history, and allergies today and no changes were needed.  Please see the problem list section below in epic for further details.  Past Medical History: Past Medical History:  Diagnosis Date  . Aortic stenosis   . Ascending aorta enlargement ([place])   . Bicuspid aortic valve   . Blood transfusion without reported diagnosis   . Heart murmur   . Hypertension   . Patent foramen ovale    Closed at the time of aortic valve replacement  . Pilonidal cyst   . S/P aortic valve replacement with bioprosthetic valve 12/05/2013   25 mm [facility] Ease bovine pericardial tissue valve   . S/P redo aortic valve replacement with human allograft aortic root graft 01/26/2018  . 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 AORTIC VALVE REPLACEMENT N/A 12/05/2013   Procedure: AORTIC VALVE REPLACEMENT (AVR);  Surgeon: [name], MD;  Location: [place];  Service: Open Heart Surgery;  Laterality: N/A;  . AORTIC VALVE REPLACEMENT N/A 01/26/2018   Procedure: REDO STERNOTOMY, REDO AORTIC VALVE REPLACEMENT WITH HUMAN ALLOGRAFT INCLUDING AORTIC ROOT REPLACEMENT,  REPAIR ASCENDING THORACIC AORTIC ANEURYSM;  Surgeon: [name], MD;  Location: [place];  Service: Open Heart Surgery;  Laterality: N/A;  . HAND SURGERY Left 07/1977  . INTRAOPERATIVE TRANSESOPHAGEAL ECHOCARDIOGRAM N/A 12/05/2013   Procedure: INTRAOPERATIVE TRANSESOPHAGEAL ECHOCARDIOGRAM;  Surgeon: [name], MD;  Location: [facility];  Service: Open Heart Surgery;  Laterality: N/A;  . LEFT AND RIGHT HEART CATHETERIZATION WITH CORONARY ANGIOGRAM N/A 11/08/2013   Procedure: LEFT AND RIGHT HEART CATHETERIZATION WITH CORONARY ANGIOGRAM;  Surgeon: Blane Ohara, MD;  Location:  [facility];  Service: Cardiovascular;  Laterality: N/A;  . PACEMAKER IMPLANT N/A 01/31/2018   SJM Assurity MRI DR implanted by Dr Rayann Heman for complete heart block  . PATENT FORAMEN OVALE CLOSURE N/A 12/05/2013   Procedure: PATENT FORAMEN OVALE CLOSURE;  Surgeon: [name], MD;  Location: [place];  Service: Open Heart Surgery;  Laterality: N/A;  . PILONIDAL CYST / SINUS EXCISION  01/20/2018   Dr Brantley Stage  . TEE WITHOUT CARDIOVERSION N/A 10/23/2013   Procedure: TRANSESOPHAGEAL ECHOCARDIOGRAM (TEE);  Surgeon: Larey Dresser, MD;  Location: [place];  Service: Cardiovascular;  Laterality: N/A;  . TEE WITHOUT CARDIOVERSION N/A 01/19/2018   Procedure: TRANSESOPHAGEAL ECHOCARDIOGRAM (TEE);  Surgeon: Sanda Klein, MD;  Location: [place];  Service: Cardiovascular;  Laterality: N/A;  . TEE WITHOUT CARDIOVERSION N/A 01/26/2018   Procedure: TRANSESOPHAGEAL ECHOCARDIOGRAM (TEE);  Surgeon: [name], MD;  Location: [place];  Service: Open Heart Surgery;  Laterality: N/A;  . VASECTOMY  06/2002   Social History: Social History   Socioeconomic History  . Marital status: Married    Spouse name: Not on file  . Number of children: 5  . Years of education: Not on file  . Highest education level: Not on file  Occupational History  . Occupation: Scientist, forensic: [employer]  Tobacco Use  . Smoking status: Never Smoker  . Smokeless tobacco: Never Used  Vaping Use  . Vaping Use: Never used  Substance and Sexual Activity  . Alcohol use: Yes    Alcohol/week: 0.0 standard drinks    Comment:  occ  . Drug use: No  . Sexual activity: Not on file  Other Topics Concern  . Not on file  Social History Narrative   The patient is married. He is originally from [place]. He works as an Art gallery manager. He does not smoke cigarettes or drink alcohol.   Social Determinants of Health   Financial Resource Strain:   . Difficulty of Paying Living Expenses: Not on file  Food  Insecurity:   . Worried About Charity fundraiser in the Last Year: Not on file  . Ran Out of Food in the Last Year: Not on file  Transportation Needs:   . Lack of Transportation (Medical): Not on file  . Lack of Transportation (Non-Medical): Not on file  Physical Activity:   . Days of Exercise per Week: Not on file  . Minutes of Exercise per Session: Not on file  Stress:   . Feeling of Stress : Not on file  Social Connections:   . Frequency of Communication with Friends and Family: Not on file  . Frequency of Social Gatherings with Friends and Family: Not on file  . Attends Religious Services: Not on file  . Active Member of Clubs or Organizations: Not on file  . Attends Archivist Meetings: Not on file  . Marital Status: Not on file   Family History: Family History  Problem Relation Age of Onset  . Diabetes Mother   . Cancer Mother        colon  . Diabetes Father   . Cancer Father        colon  . Colon cancer Father 53  . Colon polyps Father   . Diabetes Paternal Grandmother   . Cancer Paternal Grandmother   . Colon cancer Paternal Grandmother 52  . Colon polyps Paternal Grandmother   . Cancer Daughter        leukemia  . Heart attack Maternal Grandmother   . Hypertension Maternal Grandmother   . Diabetes Maternal Grandmother   . Cancer Maternal Grandmother   . Diabetes Maternal Grandfather   . Cancer Maternal Grandfather   . Stroke Paternal Grandfather   . Diabetes Paternal Grandfather   . Cancer Paternal Grandfather   . Esophageal cancer Neg Hx   . Stomach cancer Neg Hx    Allergies: No Known Allergies Medications: See med rec.  Review of Systems: No headache, visual changes, nausea, vomiting, diarrhea, constipation, dizziness, abdominal pain, skin rash, fevers, chills, night sweats, swollen lymph nodes, weight loss, chest pain, body aches, joint swelling, muscle aches, shortness of breath, mood changes, visual or auditory hallucinations.  Objective:     General: Well Developed, well nourished, and in no acute distress.  Neuro: Alert and oriented x3, extra-ocular muscles intact, sensation grossly intact. Cranial nerves II through XII are intact, motor, sensory, and coordinative functions are all intact. HEENT: Normocephalic, atraumatic, pupils equal round reactive to light, neck supple, no masses, no lymphadenopathy, thyroid nonpalpable. Oropharynx, nasopharynx, external ear canals are unremarkable. Skin: Warm and dry, no rashes noted.  Cardiac: Regular rate and rhythm, no murmurs rubs or gallops.  Respiratory: Clear to auscultation bilaterally. Not using accessory muscles, speaking in full sentences.  Abdominal: Soft, nontender, nondistended, positive bowel sounds, no masses, no organomegaly.  Musculoskeletal: Shoulder, elbow, wrist, hip, knee, ankle stable, and with full range of motion. Rectal: Smooth prostate, Hemoccult negative.  Impression and Recommendations:    The patient was counselled, risk factors were discussed, anticipatory guidance given.  Annual physical  exam Routine physical as above, flu shot today.   Essential hypertension, benign Well-controlled today, no change in plan.  Mixed hyperlipidemia [place] lipids were fairly elevated, we are going to recheck them today, he has been doing a low-cholesterol diet for the past couple of months, if still elevated I did advise him that I would again suggest a statin treatment.  Primary hyperparathyroidism ([place]) Adonis does have elevated calcium levels, he also had a markedly elevated parathyroid hormone level, we did a referral endocrinology, they tried to call him several times but his voicemail box was full and he never picked up. He is going to try to make contact with them, he was advised that there is a concern for parathyroid tumors.   ___________________________________________ Gwen Her. Dianah Field, M.D., ABFM., CAQSM. Primary Care and Sports Medicine [place]  [facility]  Adjunct Professor of [place] [employer] Medicine

## 2019-12-12 NOTE — Assessment & Plan Note (Signed)
Patrick Walter does have elevated calcium levels, he also had a markedly elevated parathyroid hormone level, we did a referral endocrinology, they tried to call him several times but his voicemail box was full and he never picked up. He is going to try to make contact with them, he was advised that there is a concern for parathyroid tumors.

## 2019-12-12 NOTE — Assessment & Plan Note (Signed)
Well-controlled today, no change in plan.

## 2019-12-12 NOTE — Addendum Note (Signed)
Addended by: Jamesetta So on: 12/12/2019 03:25 PM   Modules accepted: Orders

## 2019-12-13 LAB — LIPID PANEL W/REFLEX DIRECT LDL
Cholesterol: 219 mg/dL — ABNORMAL HIGH (ref <200)
HDL: 54 mg/dL (ref >=40)
LDL Cholesterol (Calc): 147 mg/dL (calc) — ABNORMAL HIGH
Non-HDL Cholesterol (Calc): 165 mg/dL (calc) — ABNORMAL HIGH (ref <130)
Total CHOL/HDL Ratio: 4.1 (calc) (ref <5.0)
Triglycerides: 78 mg/dL (ref <150)

## 2020-01-04 ENCOUNTER — Other Ambulatory Visit: Payer: Self-pay | Admitting: Sports Medicine

## 2020-01-04 DIAGNOSIS — M25552 Pain in left hip: Secondary | ICD-10-CM

## 2020-01-04 MED ORDER — CELECOXIB 200 MG PO CAPS: 200.0000 mg | ORAL_CAPSULE | Freq: Every day | ORAL | 3 refills | Status: DC | PRN

## 2020-01-16 DIAGNOSIS — E210 Primary hyperparathyroidism: Secondary | ICD-10-CM

## 2020-01-31 ENCOUNTER — Ambulatory Visit (INDEPENDENT_AMBULATORY_CARE_PROVIDER_SITE_OTHER): Payer: 59

## 2020-01-31 DIAGNOSIS — I442 Atrioventricular block, complete: Secondary | ICD-10-CM | POA: Diagnosis not present

## 2020-01-31 LAB — CUP PACEART REMOTE DEVICE CHECK
Battery Remaining Longevity: 122 mo
Battery Remaining Percentage: 95.5 %
Battery Voltage: 3.01 V
Brady Statistic AP VP Percent: 47 %
Brady Statistic AP VS Percent: 1 %
Brady Statistic AS VP Percent: 34 %
Brady Statistic AS VS Percent: 18 %
Brady Statistic RA Percent Paced: 47 %
Brady Statistic RV Percent Paced: 81 %
Date Time Interrogation Session: 20211111044506
Implantable Lead Implant Date: 20191112
Implantable Lead Implant Date: 20191112
Implantable Lead Location: 753859
Implantable Lead Location: 753860
Implantable Pulse Generator Implant Date: 20191112
Lead Channel Impedance Value: 390 Ohm
Lead Channel Impedance Value: 540 Ohm
Lead Channel Pacing Threshold Amplitude: 0.5 V
Lead Channel Pacing Threshold Amplitude: 0.625 V
Lead Channel Pacing Threshold Pulse Width: 0.5 ms
Lead Channel Pacing Threshold Pulse Width: 0.5 ms
Lead Channel Sensing Intrinsic Amplitude: 12 mV
Lead Channel Sensing Intrinsic Amplitude: 4.1 mV
Lead Channel Setting Pacing Amplitude: 0.875
Lead Channel Setting Pacing Amplitude: 1.5 V
Lead Channel Setting Pacing Pulse Width: 0.5 ms
Lead Channel Setting Sensing Sensitivity: 3.5 mV
Pulse Gen Model: 2272
Pulse Gen Serial Number: 9086757

## 2020-02-04 NOTE — Progress Notes (Signed)
Remote pacemaker transmission.   

## 2020-03-18 ENCOUNTER — Ambulatory Visit (INDEPENDENT_AMBULATORY_CARE_PROVIDER_SITE_OTHER): Payer: 59

## 2020-03-18 ENCOUNTER — Other Ambulatory Visit: Payer: Self-pay

## 2020-03-18 ENCOUNTER — Ambulatory Visit (INDEPENDENT_AMBULATORY_CARE_PROVIDER_SITE_OTHER): Payer: 59 | Admitting: Sports Medicine

## 2020-03-18 DIAGNOSIS — M1612 Unilateral primary osteoarthritis, left hip: Secondary | ICD-10-CM

## 2020-03-18 NOTE — Progress Notes (Signed)
    Procedures performed today:    Procedure: Real-time Ultrasound Guided injection of the left hip joint Device: Samsung HS60  Verbal informed consent obtained.  Time-out conducted.  Noted no overlying erythema, induration, or other signs of local infection.  Skin prepped in a sterile fashion.  Local anesthesia: Topical Ethyl chloride.  With sterile technique and under real time ultrasound guidance: 1 cc Kenalog 40, 2 cc lidocaine, 2 cc bupivacaine injected easily Completed without difficulty  Advised to call if fevers/chills, erythema, induration, drainage, or persistent bleeding.  Images permanently stored and available for review in PACS.  Impression: Technically successful ultrasound guided injection.  Independent interpretation of notes and tests performed by another provider:   None.  Brief History, Exam, Impression, and Recommendations:    Primary osteoarthritis of left hip This is a very pleasant 64 year old male, known hip osteoarthritis, last hip injection was in June of this year, repeated today, return as needed.    ___________________________________________ Ihor Austin. Benjamin Stain, M.D., ABFM., CAQSM. Primary Care and Sports Medicine [facility] [facility]  Adjunct Instructor of Family Medicine  [employer]

## 2020-03-18 NOTE — Assessment & Plan Note (Signed)
This is a very pleasant 64 year old male, known hip osteoarthritis, last hip injection was in June of this year, repeated today, return as needed.

## 2020-03-25 ENCOUNTER — Ambulatory Visit: Payer: 59 | Admitting: Sports Medicine

## 2020-03-26 ENCOUNTER — Encounter: Payer: Self-pay | Admitting: Internal Medicine

## 2020-03-26 ENCOUNTER — Other Ambulatory Visit: Payer: Self-pay

## 2020-03-26 ENCOUNTER — Ambulatory Visit (INDEPENDENT_AMBULATORY_CARE_PROVIDER_SITE_OTHER): Payer: 59 | Admitting: Internal Medicine

## 2020-03-26 VITALS — BP 150/92 | HR 80 | Ht 71.0 in | Wt 194.0 lb

## 2020-03-26 DIAGNOSIS — E213 Hyperparathyroidism, unspecified: Secondary | ICD-10-CM

## 2020-03-26 LAB — BASIC METABOLIC PANEL
BUN: 19 mg/dL (ref 6–23)
CO2: 29 mEq/L (ref 19–32)
Calcium: 10.7 mg/dL — ABNORMAL HIGH (ref 8.4–10.5)
Chloride: 108 mEq/L (ref 96–112)
Creatinine, Ser: 1.02 mg/dL (ref 0.40–1.50)
GFR: 78.78 mL/min (ref >60.00)
Glucose, Bld: 90 mg/dL (ref 70–99)
Potassium: 4.3 mEq/L (ref 3.5–5.1)
Sodium: 141 mEq/L (ref 135–145)

## 2020-03-26 LAB — VITAMIN D 25 HYDROXY (VIT D DEFICIENCY, FRACTURES): VITD: 32 ng/mL (ref 30.00–100.00)

## 2020-03-26 LAB — ALBUMIN: Albumin: 4.6 g/dL (ref 3.5–5.2)

## 2020-03-26 NOTE — Patient Instructions (Signed)
-   Please stay hydrated - Avoid over the counter calcium tablets - Consume 2-3 servings of calcium in your diet     24-Hour Urine Collection   You will be collecting your urine for a 24-hour period of time.  Your timer starts with your first urine of the morning (For example - If you first pee at 9AM, your timer will start at 9AM)  Throw away your first urine of the morning  Collect your urine every time you pee for the next 24 hours STOP your urine collection 24 hours after you started the collection (For example - You would stop at 9AM the day after you started)  

## 2020-03-26 NOTE — Progress Notes (Unsigned)
Name: Patrick Walter  MRN/ DOB: [id number], [birth year]    Age/ Sex: 65 y.o., male    PCP: Monica Becton, MD   Reason for Endocrinology Evaluation: Hypercalcemia      Date of Initial Endocrinology Evaluation: 03/26/2020     HPI: Patrick Walter is a 65 y.o. male with a past medical history of Aortic valve replacement.  The patient presented for initial endocrinology clinic visit on 03/26/2020 for consultative assistance with his hypercalcemia .   Patrick Walter indicates that he was first diagnosed with hypercalcemia in 2019. Since that time, he has not experienced symptoms of constipation, ppolydipsia, generalized weakness, diffuse muscle pains. He admits use of over the counter calcium (including supplements, Tums, Rolaids, or other calcium containing antacids), lithium, HCTZ, or Vitamin D   He denies  history of kidney stones, kidney disease, liver disease, granulomatous disease. He denies  osteoporosis or prior fractures. Daily dietary calcium intake: 1 servings .  He has family history of osteoporosis ( kids) , parathyroid disease, thyroid disease.          HISTORY:  Past Medical History:  Past Medical History:  Diagnosis Date  . Aortic stenosis   . Ascending aorta enlargement (HCC)   . Bicuspid aortic valve   . Blood transfusion without reported diagnosis   . Heart murmur   . Hypertension   . Patent foramen ovale    Closed at the time of aortic valve replacement  . Pilonidal cyst   . S/P aortic valve replacement with bioprosthetic valve 12/05/2013   25 mm [facility] Ease bovine pericardial tissue valve   . S/P redo aortic valve replacement with human allograft aortic root graft 01/26/2018  . Septic embolism (HCC)    Brain, spleen, superior mesenteric artery   Past Surgical History:  Past Surgical History:  Procedure Laterality Date  . AORTIC VALVE REPLACEMENT N/A 12/05/2013   Procedure: AORTIC VALVE REPLACEMENT (AVR);  Surgeon: Purcell Nails, MD;   Location: [facility] OR;  Service: Open Heart Surgery;  Laterality: N/A;  . AORTIC VALVE REPLACEMENT N/A 01/26/2018   Procedure: REDO STERNOTOMY, REDO AORTIC VALVE REPLACEMENT WITH HUMAN ALLOGRAFT INCLUDING AORTIC ROOT REPLACEMENT,  REPAIR ASCENDING THORACIC AORTIC ANEURYSM;  Surgeon: Purcell Nails, MD;  [facility] OR;  Service: Open Heart Surgery;  Laterality: N/A;  . HAND SURGERY Left 07/1977  . INTRAOPERATIVE TRANSESOPHAGEAL ECHOCARDIOGRAM N/A 12/05/2013   Procedure: INTRAOPERATIVE TRANSESOPHAGEAL ECHOCARDIOGRAM;  Surgeon: Purcell Nails, MD;  Location[facility] OR;  Service: Open Heart Surgery;  Laterality: N/A;  . LEFT AND RIGHT HEART CATHETERIZATION WITH CORONARY ANGIOGRAM N/A 11/08/2013   Procedure: LEFT AND RIGHT HEART CATHETERIZATION WITH CORONARY ANGIOGRAM;  Surgeon: Micheline Chapman, MD;  [facility] CATH LAB;  Service: Cardiovascular;  Laterality: N/A;  . PACEMAKER IMPLANT N/A 01/31/2018   SJM Assurity MRI DR implanted by Dr Johney Frame for complete heart block  . PATENT FORAMEN OVALE CLOSURE N/A 12/05/2013   Procedure: PATENT FORAMEN OVALE CLOSURE;  Surgeon: Purcell Nails, MD;  Location: [facility] OR;  Service: Open Heart Surgery;  Laterality: N/A;  . PILONIDAL CYST / SINUS EXCISION  01/20/2018   Dr Luisa Hart  . TEE WITHOUT CARDIOVERSION N/A 10/23/2013   Procedure: TRANSESOPHAGEAL ECHOCARDIOGRAM (TEE);  Surgeon: Laurey Morale, MD;  Location: [facility] ENDOSCOPY;  Service: Cardiovascular;  Laterality: N/A;  . TEE WITHOUT CARDIOVERSION N/A 01/19/2018   Procedure: TRANSESOPHAGEAL ECHOCARDIOGRAM (TEE);  Surgeon: Thurmon Fair, MD;  Location: [facility] ENDOSCOPY;  Service: Cardiovascular;  Laterality: N/A;  .  TEE WITHOUT CARDIOVERSION N/A 01/26/2018   Procedure: TRANSESOPHAGEAL ECHOCARDIOGRAM (TEE);  Surgeon: Purcell Nails, MD;  Location: [facility] OR;  Service: Open Heart Surgery;  Laterality: N/A;  . VASECTOMY  06/2002      Social History:  reports that he has never smoked. He has never used smokeless tobacco. He reports  current alcohol use. He reports that he does not use drugs.  Family History: family history includes Cancer in his daughter, father, maternal grandfather, maternal grandmother, mother, paternal grandfather, and paternal grandmother; Colon cancer (age of onset: 77) in his paternal grandmother; Colon cancer (age of onset: 47) in his father; Colon polyps in his father and paternal grandmother; Diabetes in his father, maternal grandfather, maternal grandmother, mother, paternal grandfather, and paternal grandmother; Heart attack in his maternal grandmother; Hypertension in his maternal grandmother; Stroke in his paternal grandfather.   HOME MEDICATIONS: Allergies as of 03/26/2020   No Known Allergies     Medication List       Accurate as of March 26, 2020 11:38 AM. If you have any questions, ask your nurse or doctor.        allopurinol 300 MG tablet Commonly known as: ZYLOPRIM Take 1 tablet (300 mg total) by mouth daily.   amoxicillin 500 MG tablet Commonly known as: AMOXIL 4 tabs (2000mg ) 1 hours prior to procedure   aspirin EC 81 MG tablet Take 81 mg by mouth daily.   bisacodyl 5 MG EC tablet Commonly known as: DULCOLAX Take 10 mg by mouth daily as needed for moderate constipation (take with iron).   celecoxib 200 MG capsule Commonly known as: CELEBREX Take 1-2 capsules (200-400 mg total) by mouth daily as needed for mild pain.   Coenzyme Q10 300 MG Caps Take 1 capsule by mouth daily.   COLLAGEN PO Take by mouth.   GLUCOSAMINE COMPLEX PO Take 1 capsule by mouth daily.   multivitamin with minerals Tabs tablet Take 1 tablet by mouth daily.   Omega 3 1000 MG Caps Take 3 capsules by mouth daily.   PROBIOTIC PO Take 1 capsule by mouth daily.   TURMERIC PO Take 1 capsule by mouth daily.   vitamin C 1000 MG tablet Take 1,000 mg by mouth daily.   Vitamin D (Ergocalciferol) 1.25 MG (50000 UNIT) Caps capsule Commonly known as: DRISDOL Take 1 capsule (50,000 Units  total) by mouth every 7 (seven) days. Take for 8 total doses(weeks).  Do not start this medication until calcium levels have been drawn from blood.         REVIEW OF SYSTEMS: A comprehensive ROS was conducted with the patient and is negative except as per HPI     OBJECTIVE:  VS: BP (!) 150/92   Pulse 80   Ht 5\' 11"  (1.803 m)   Wt 194 lb (88 kg)   SpO2 98%   BMI 27.06 kg/m    Wt Readings from Last 3 Encounters:  03/26/20 194 lb (88 kg)  12/12/19 196 lb (88.9 kg)  11/09/19 197 lb (89.4 kg)     EXAM: General: Pt appears well and is in NAD  Neck: General: Supple without adenopathy. Thyroid: Thyroid size normal.  No goiter or nodules appreciated. No thyroid bruit.  Lungs: Clear with good BS bilat with no rales, rhonchi, or wheezes  Heart: Auscultation: RRR.  Abdomen: Normoactive bowel sounds, soft, nontender, without masses or organomegaly palpable  Extremities:  BL LE: No pretibial edema normal ROM and strength.  Neuro: Cranial nerves: II - XII  grossly intact  Motor: Normal strength throughout DTRs: 2+ and symmetric in UE without delay in relaxation phase  Mental Status: Judgment, insight: Intact Orientation: Oriented to time, place, and person Mood and affect: No depression, anxiety, or agitation     DATA REVIEWED: Results for Patrick Walter, Patrick Walter (MRN [id number]) as of 03/27/2020 12:03  Ref. Range 03/26/2020 11:30  Sodium Latest Ref Range: 135 - 145 mEq/L 141  Potassium Latest Ref Range: 3.5 - 5.1 mEq/L 4.3  Chloride Latest Ref Range: 96 - 112 mEq/L 108  CO2 Latest Ref Range: 19 - 32 mEq/L 29  Glucose Latest Ref Range: 70 - 99 mg/dL 90  BUN Latest Ref Range: 6 - 23 mg/dL 19  Creatinine Latest Ref Range: 0.40 - 1.50 mg/dL 1.02  Calcium Latest Ref Range: 8.4 - 10.5 mg/dL 10.7 (H)  Albumin Latest Ref Range: 3.5 - 5.2 g/dL 4.6  GFR Latest Ref Range: >60.00 mL/min 78.78  VITD Latest Ref Range: 30.00 - 100.00 ng/mL 32.00  PTH, Intact Latest Ref Range: 14 - 64 pg/mL 68 (H)       ASSESSMENT/PLAN/RECOMMENDATIONS:   1. Hyperparathyroidism :  -We discussed differential of familial hypercalcinuric hypercalcemia ([place]) vs Primary Hyperparathyroidism (pHPT)  - It is important to differentiate between the two, as [place] does not cause any organ damage and does not require further follow up , on the other hand pHPT could cause end organ damage and would require further evaluation.  - Will proceed with 24-hr urine collection for Ca/Cr ratio  - Repeat labs today continue to show elevated PTH , corrected serum calcium 10.22 g/dL     Recommendations - Please stay hydrated - Avoid over the counter calcium tablets - Consume 2-3 servings of calcium in your diet  - Vitamin D3 800 IU daily      F/U in 4 months    Signed electronically by: Mack Guise, MD  [facility] Endocrinology  [facility] [address] [place], [place] [address] Phone: [phone] FAX: [phone]   CC: [name], [place] [address] Phone: [phone] Fax: [phone]   Return to Endocrinology clinic as below: Future Appointments  Date Time Provider [place]  05/01/2020  7:50 AM [facility] DEVICE REMOTES [facility]  07/24/2020 10:50 AM Oziah Vitanza, Melanie Crazier, MD [facility] None  07/31/2020  7:50 AM [facility] DEVICE REMOTES [facility]  10/30/2020  7:50 AM [facility] DEVICE REMOTES [facility]  01/29/2021  7:50 AM [facility] DEVICE REMOTES [facility]  04/30/2021  7:50 AM [facility] DEVICE REMOTES [facility]  07/30/2021  7:50 AM [facility] DEVICE REMOTES [facility]

## 2020-03-27 ENCOUNTER — Encounter: Payer: Self-pay | Admitting: Internal Medicine

## 2020-03-27 DIAGNOSIS — E213 Hyperparathyroidism, unspecified: Secondary | ICD-10-CM | POA: Insufficient documentation

## 2020-03-27 LAB — PARATHYROID HORMONE, INTACT (NO CA): PTH: 68 pg/mL — ABNORMAL HIGH (ref 14–64)

## 2020-03-31 ENCOUNTER — Other Ambulatory Visit: Payer: 59

## 2020-03-31 ENCOUNTER — Other Ambulatory Visit: Payer: Self-pay

## 2020-03-31 DIAGNOSIS — E213 Hyperparathyroidism, unspecified: Secondary | ICD-10-CM

## 2020-04-01 NOTE — Addendum Note (Signed)
Addended by: Kaylyn Lim I on: 04/01/2020 07:34 AM   Modules accepted: Orders

## 2020-04-02 ENCOUNTER — Telehealth: Payer: Self-pay | Admitting: Internal Medicine

## 2020-04-02 ENCOUNTER — Encounter: Payer: Self-pay | Admitting: Internal Medicine

## 2020-04-02 DIAGNOSIS — E210 Primary hyperparathyroidism: Secondary | ICD-10-CM

## 2020-04-02 LAB — TEST AUTHORIZATION

## 2020-04-02 LAB — CREATININE, URINE, 24 HOUR: Creatinine, 24H Ur: 2.39 g/(24.h) — ABNORMAL HIGH (ref 0.50–2.15)

## 2020-04-02 LAB — CALCIUM, URINE, 24 HOUR: Calcium, 24H Urine: 360 mg/24 h — ABNORMAL HIGH

## 2020-04-02 NOTE — Telephone Encounter (Signed)
Patrick Walter,   Can you please schedule him for bone density at [facility] office? He may email [place] with appropriate times/dates    Thanks    Abby Nena Jordan, MD  [facility] Endocrinology  [facility] [address] [place], [place] [address] Phone: [phone] FAX: [phone]

## 2020-04-04 ENCOUNTER — Ambulatory Visit (INDEPENDENT_AMBULATORY_CARE_PROVIDER_SITE_OTHER)
Admission: RE | Admit: 2020-04-04 | Discharge: 2020-04-04 | Disposition: A | Discharge status: Home / Self Care | Payer: 59 | Source: Ambulatory Visit | Attending: Internal Medicine | Admitting: Internal Medicine

## 2020-04-04 ENCOUNTER — Other Ambulatory Visit: Payer: Self-pay

## 2020-04-04 DIAGNOSIS — E210 Primary hyperparathyroidism: Secondary | ICD-10-CM | POA: Diagnosis not present

## 2020-04-08 ENCOUNTER — Encounter: Payer: Self-pay | Admitting: Internal Medicine

## 2020-05-01 ENCOUNTER — Ambulatory Visit (INDEPENDENT_AMBULATORY_CARE_PROVIDER_SITE_OTHER): Payer: 59

## 2020-05-01 DIAGNOSIS — I442 Atrioventricular block, complete: Secondary | ICD-10-CM | POA: Diagnosis not present

## 2020-05-01 LAB — CUP PACEART REMOTE DEVICE CHECK
Battery Remaining Longevity: 124 mo
Battery Remaining Percentage: 95.5 %
Battery Voltage: 3.01 V
Brady Statistic AP VP Percent: 46 %
Brady Statistic AP VS Percent: 1 %
Brady Statistic AS VP Percent: 36 %
Brady Statistic AS VS Percent: 17 %
Brady Statistic RA Percent Paced: 47 %
Brady Statistic RV Percent Paced: 82 %
Date Time Interrogation Session: 20220210020020
Implantable Lead Implant Date: 20191112
Implantable Lead Implant Date: 20191112
Implantable Lead Location: 753859
Implantable Lead Location: 753860
Implantable Pulse Generator Implant Date: 20191112
Lead Channel Impedance Value: 380 Ohm
Lead Channel Impedance Value: 510 Ohm
Lead Channel Pacing Threshold Amplitude: 0.5 V
Lead Channel Pacing Threshold Amplitude: 0.625 V
Lead Channel Pacing Threshold Pulse Width: 0.5 ms
Lead Channel Pacing Threshold Pulse Width: 0.5 ms
Lead Channel Sensing Intrinsic Amplitude: 11.3 mV
Lead Channel Sensing Intrinsic Amplitude: 4.2 mV
Lead Channel Setting Pacing Amplitude: 0.875
Lead Channel Setting Pacing Amplitude: 1.5 V
Lead Channel Setting Pacing Pulse Width: 0.5 ms
Lead Channel Setting Sensing Sensitivity: 3.5 mV
Pulse Gen Model: 2272
Pulse Gen Serial Number: 9086757

## 2020-05-05 DIAGNOSIS — T826XXD Infection and inflammatory reaction due to cardiac valve prosthesis, subsequent encounter: Secondary | ICD-10-CM

## 2020-05-05 DIAGNOSIS — Z954 Presence of other heart-valve replacement: Secondary | ICD-10-CM

## 2020-05-05 DIAGNOSIS — I38 Endocarditis, valve unspecified: Secondary | ICD-10-CM | POA: Diagnosis not present

## 2020-05-05 DIAGNOSIS — I330 Acute and subacute infective endocarditis: Secondary | ICD-10-CM

## 2020-05-05 MED ORDER — AMOXICILLIN 500 MG PO TABS: ORAL_TABLET | ORAL | 3 refills | Status: DC

## 2020-05-05 NOTE — Telephone Encounter (Signed)
I spent 5 total minutes of online digital evaluation and management services. 

## 2020-05-07 NOTE — Progress Notes (Signed)
Remote pacemaker transmission.   

## 2020-06-03 MED ORDER — AMOXICILLIN 500 MG PO TABS: ORAL_TABLET | ORAL | 3 refills | Status: DC

## 2020-06-03 NOTE — Addendum Note (Signed)
Addended by: [name] on: 06/03/2020 12:12 PM   Modules accepted: Orders

## 2020-07-24 ENCOUNTER — Ambulatory Visit
Admission: RE | Admit: 2020-07-24 | Discharge: 2020-07-24 | Disposition: A | Discharge status: Home / Self Care | Payer: 59 | Source: Ambulatory Visit | Attending: Internal Medicine | Admitting: Internal Medicine

## 2020-07-24 ENCOUNTER — Other Ambulatory Visit: Payer: Self-pay

## 2020-07-24 ENCOUNTER — Encounter: Payer: Self-pay | Admitting: Internal Medicine

## 2020-07-24 ENCOUNTER — Ambulatory Visit (INDEPENDENT_AMBULATORY_CARE_PROVIDER_SITE_OTHER): Payer: 59 | Admitting: Internal Medicine

## 2020-07-24 VITALS — BP 142/92 | HR 90

## 2020-07-24 DIAGNOSIS — E210 Primary hyperparathyroidism: Secondary | ICD-10-CM

## 2020-07-24 LAB — BASIC METABOLIC PANEL
BUN: 19 mg/dL (ref 6–23)
CO2: 27 mEq/L (ref 19–32)
Calcium: 10.6 mg/dL — ABNORMAL HIGH (ref 8.4–10.5)
Chloride: 109 mEq/L (ref 96–112)
Creatinine, Ser: 1 mg/dL (ref 0.40–1.50)
GFR: 80.49 mL/min (ref >60.00)
Glucose, Bld: 81 mg/dL (ref 70–99)
Potassium: 4.7 mEq/L (ref 3.5–5.1)
Sodium: 142 mEq/L (ref 135–145)

## 2020-07-24 LAB — ALBUMIN: Albumin: 4.4 g/dL (ref 3.5–5.2)

## 2020-07-24 LAB — VITAMIN D 25 HYDROXY (VIT D DEFICIENCY, FRACTURES): VITD: 30.36 ng/mL (ref 30.00–100.00)

## 2020-07-24 NOTE — Patient Instructions (Signed)
-   Please stay hydrated - Avoid over the counter calcium tablets - Consume 2-3 servings of calcium in your diet     24-Hour Urine Collection   You will be collecting your urine for a 24-hour period of time.  Your timer starts with your first urine of the morning (For example - If you first pee at [place], your timer will start at [place])  [place] away your first urine of the morning  Collect your urine every time you pee for the next 24 hours STOP your urine collection 24 hours after you started the collection (For example - You would stop at 9AM the day after you started)

## 2020-07-24 NOTE — Progress Notes (Signed)
Name: Patrick Walter  MRN/ DOB: [id number], 1955-07-18    Age/ Sex: 65 y.o., male     PCP: [name], MD   Reason for Endocrinology Evaluation: Hypercalcemia      Initial Endocrinology Clinic Visit: 03/26/2020    PATIENT IDENTIFIER: Patrick Walter is a 65 y.o., male with a past medical history of history of Aortic valve replacement. He has followed with [facility] since 03/26/2020 for consultative assistance with management of his hypercalcemia    HISTORICAL SUMMARY: The patient was first diagnosed with hypercalcemia in 2019. With a max serum calcium of 11.4 mg/dL and a max PTH of 103 pg/mL in 09/2019.   He denies  history of kidney stones, kidney disease, liver disease, granulomatous disease  DXA showed low bone density 04/04/2020 24 hr urinary excretion of calcium was 360 mg ( 03/2020)    SUBJECTIVE:     Today (07/24/2020):  Patrick Walter is here for a follow up on Primary hyperparathyroidism.   Denies polyuria , improving frequency No renal stones  Consumes at least 2 servings of calcium daily  NO muscle cramps     Vitamin D 800 iu daily   HISTORY:  Past Medical History:  Past Medical History:  Diagnosis Date  . Aortic stenosis   . Ascending aorta enlargement ([place])   . Bicuspid aortic valve   . Blood transfusion without reported diagnosis   . Heart murmur   . Hypertension   . Patent foramen ovale    Closed at the time of aortic valve replacement  . Pilonidal cyst   . S/P aortic valve replacement with bioprosthetic valve 12/05/2013   25 mm [facility] Ease bovine pericardial tissue valve   . S/P redo aortic valve replacement with human allograft aortic root graft 01/26/2018  . Septic embolism (HCC)    Brain, spleen, superior mesenteric artery   Past Surgical History:  Past Surgical History:  Procedure Laterality Date  . AORTIC VALVE REPLACEMENT N/A 12/05/2013   Procedure: AORTIC VALVE REPLACEMENT (AVR);  Surgeon: [name], MD;  Location: [facility];  Service: Open Heart Surgery;  Laterality: N/A;  . AORTIC VALVE REPLACEMENT N/A 01/26/2018   Procedure: REDO STERNOTOMY, REDO AORTIC VALVE REPLACEMENT WITH HUMAN ALLOGRAFT INCLUDING AORTIC ROOT REPLACEMENT,  REPAIR ASCENDING THORACIC AORTIC ANEURYSM;  Surgeon: [name], MD;  Location: [facility];  Service: Open Heart Surgery;  Laterality: N/A;  . HAND SURGERY Left 07/1977  . INTRAOPERATIVE TRANSESOPHAGEAL ECHOCARDIOGRAM N/A 12/05/2013   Procedure: INTRAOPERATIVE TRANSESOPHAGEAL ECHOCARDIOGRAM;  Surgeon: [name], MD;  Location: [facility];  Service: Open Heart Surgery;  Laterality: N/A;  . LEFT AND RIGHT HEART CATHETERIZATION WITH CORONARY ANGIOGRAM N/A 11/08/2013   Procedure: LEFT AND RIGHT HEART CATHETERIZATION WITH CORONARY ANGIOGRAM;  Surgeon: Blane Ohara, MD;  Location: [facility] CATH LAB;  Service: Cardiovascular;  Laterality: N/A;  . PACEMAKER IMPLANT N/A 01/31/2018   SJM Assurity MRI DR implanted by Dr Rayann Heman for complete heart block  . PATENT FORAMEN OVALE CLOSURE N/A 12/05/2013   Procedure: PATENT FORAMEN OVALE CLOSURE;  Surgeon: [name], MD;  Location: [facility];  Service: Open Heart Surgery;  Laterality: N/A;  . PILONIDAL CYST / SINUS EXCISION  01/20/2018   Dr Brantley Stage  . TEE WITHOUT CARDIOVERSION N/A 10/23/2013   Procedure: TRANSESOPHAGEAL ECHOCARDIOGRAM (TEE);  Surgeon: Larey Dresser, MD;  Location: [facility];  Service: Cardiovascular;  Laterality: N/A;  . TEE WITHOUT CARDIOVERSION N/A 01/19/2018   Procedure: TRANSESOPHAGEAL ECHOCARDIOGRAM (TEE);  Surgeon: Sanda Klein, MD;  Location: [facility];  Service: Cardiovascular;  Laterality: N/A;  . TEE WITHOUT CARDIOVERSION N/A 01/26/2018   Procedure: TRANSESOPHAGEAL ECHOCARDIOGRAM (TEE);  Surgeon: [name], MD;  Location: [facility];  Service: Open Heart Surgery;  Laterality: N/A;  . VASECTOMY  06/2002    Social History:  reports that he has never smoked. He has never used smokeless tobacco. He  reports current alcohol use. He reports that he does not use drugs. Family History:  Family History  Problem Relation Age of Onset  . Diabetes Mother   . Cancer Mother        colon  . Diabetes Father   . Cancer Father        colon  . Colon cancer Father 20  . Colon polyps Father   . Diabetes Paternal Grandmother   . Cancer Paternal Grandmother   . Colon cancer Paternal Grandmother 50  . Colon polyps Paternal Grandmother   . Cancer Daughter        leukemia  . Heart attack Maternal Grandmother   . Hypertension Maternal Grandmother   . Diabetes Maternal Grandmother   . Cancer Maternal Grandmother   . Diabetes Maternal Grandfather   . Cancer Maternal Grandfather   . Stroke Paternal Grandfather   . Diabetes Paternal Grandfather   . Cancer Paternal Grandfather   . Esophageal cancer Neg Hx   . Stomach cancer Neg Hx      HOME MEDICATIONS: Allergies as of 07/24/2020   No Known Allergies     Medication List       Accurate as of Jul 24, 2020 10:59 AM. If you have any questions, ask your nurse or doctor.        allopurinol 300 MG tablet Commonly known as: ZYLOPRIM Take 1 tablet (300 mg total) by mouth daily.   amoxicillin 500 MG tablet Commonly known as: AMOXIL 4 tabs (2000mg ) 1 hours prior to procedure   aspirin EC 81 MG tablet Take 81 mg by mouth daily.   bisacodyl 5 MG EC tablet Commonly known as: DULCOLAX Take 10 mg by mouth daily as needed for moderate constipation (take with iron).   celecoxib 200 MG capsule Commonly known as: CELEBREX Take 1-2 capsules (200-400 mg total) by mouth daily as needed for mild pain.   Coenzyme Q10 300 MG Caps Take 1 capsule by mouth daily.   COLLAGEN PO Take by mouth.   GLUCOSAMINE COMPLEX PO Take 1 capsule by mouth daily.   multivitamin with minerals Tabs tablet Take 1 tablet by mouth daily.   Omega 3 1000 MG Caps Take 3 capsules by mouth daily.   PROBIOTIC PO Take 1 capsule by mouth daily.   TURMERIC PO Take 1  capsule by mouth daily.   vitamin C 1000 MG tablet Take 1,000 mg by mouth daily.   Vitamin D (Ergocalciferol) 1.25 MG (50000 UNIT) Caps capsule Commonly known as: DRISDOL Take 1 capsule (50,000 Units total) by mouth every 7 (seven) days. Take for 8 total doses(weeks).  Do not start this medication until calcium levels have been drawn from blood.         OBJECTIVE:   PHYSICAL EXAM: VS: BP (!) 150/92   Pulse 90   SpO2 98%    EXAM: General: Pt appears well and is in NAD  Neck: General: Supple without adenopathy. Thyroid: Thyroid size normal.  No goiter or nodules appreciated.   Lungs: Clear with good BS bilat with no rales, rhonchi, or wheezes  Heart: Auscultation: RRR.  Abdomen: Normoactive bowel sounds, soft, nontender, without masses or organomegaly palpable  Extremities:  BL LE: No pretibial edema normal ROM and strength.  Mental Status: Judgment, insight: Intact Orientation: Oriented to time, place, and person Mood and affect: No depression, anxiety, or agitation     DATA REVIEWED: Results for ELIZABETH, HAFF (MRN [id number]) as of 07/25/2020 12:53  Ref. Range 07/24/2020 11:36  Sodium Latest Ref Range: 135 - 145 mEq/L 142  Potassium Latest Ref Range: 3.5 - 5.1 mEq/L 4.7  Chloride Latest Ref Range: 96 - 112 mEq/L 109  CO2 Latest Ref Range: 19 - 32 mEq/L 27  Glucose Latest Ref Range: 70 - 99 mg/dL 81  BUN Latest Ref Range: 6 - 23 mg/dL 19  Creatinine Latest Ref Range: 0.40 - 1.50 mg/dL 1.00  Calcium Latest Ref Range: 8.4 - 10.5 mg/dL 10.6 (H)  Albumin Latest Ref Range: 3.5 - 5.2 g/dL 4.4  GFR Latest Ref Range: >60.00 mL/min 80.49  VITD Latest Ref Range: 30.00 - 100.00 ng/mL 30.36  PTH, Intact Latest Ref Range: 16 - 77 pg/mL 59      ASSESSMENT / PLAN / RECOMMENDATIONS:   1. Primary Hyperparathyroidism:   -Patient asymptomatic -He has had normal bone density as well as normal GFR -24 urinary excretion of calcium was elevated>300 mg.  We have discussed repeating  this and if this remains elevated the patient will qualify for surgical intervention -We will also proceed with a KUB for nephrocalcinosis/nephro lithiasis -Repeat serum calcium and PTH levels are stable  Recommendations - Encouraged hydration  - AVOID CALCIUM SUPPLEMENTS, AVOID LOW CALCIUM DIET - Maintain normal dietary calcium intake (2-3 servings of dairy a day)    Follow-up in 6 months  Signed electronically by: Mack Guise, MD  [facility] Endocrinology  [facility] [address] [place], [place] [address] Phone: [phone] FAX: [phone]      CC: [name], [place] [address] Phone: [phone]  Fax: [phone]   Return to Endocrinology clinic as below: Future Appointments  Date Time Provider [place]  07/31/2020  7:50 AM [facility] DEVICE REMOTES [facility]  10/30/2020  7:50 AM [facility] DEVICE REMOTES [facility]  01/29/2021  7:50 AM [facility] DEVICE REMOTES [facility]  04/30/2021  7:50 AM [facility] DEVICE REMOTES [facility]  07/30/2021  7:50 AM [facility] DEVICE REMOTES [facility]

## 2020-07-25 LAB — PARATHYROID HORMONE, INTACT (NO CA): PTH: 59 pg/mL (ref 16–77)

## 2020-07-27 ENCOUNTER — Other Ambulatory Visit: Payer: Self-pay | Admitting: Sports Medicine

## 2020-07-27 DIAGNOSIS — M25552 Pain in left hip: Secondary | ICD-10-CM

## 2020-07-31 ENCOUNTER — Ambulatory Visit (INDEPENDENT_AMBULATORY_CARE_PROVIDER_SITE_OTHER): Payer: 59

## 2020-07-31 DIAGNOSIS — I442 Atrioventricular block, complete: Secondary | ICD-10-CM

## 2020-07-31 LAB — CUP PACEART REMOTE DEVICE CHECK
Battery Remaining Longevity: 124 mo
Battery Remaining Percentage: 95.5 %
Battery Voltage: 3.01 V
Brady Statistic AP VP Percent: 45 %
Brady Statistic AP VS Percent: 1 %
Brady Statistic AS VP Percent: 36 %
Brady Statistic AS VS Percent: 18 %
Brady Statistic RA Percent Paced: 45 %
Brady Statistic RV Percent Paced: 81 %
Date Time Interrogation Session: 20220512050125
Implantable Lead Implant Date: 20191112
Implantable Lead Implant Date: 20191112
Implantable Lead Location: 753859
Implantable Lead Location: 753860
Implantable Pulse Generator Implant Date: 20191112
Lead Channel Impedance Value: 390 Ohm
Lead Channel Impedance Value: 510 Ohm
Lead Channel Pacing Threshold Amplitude: 0.5 V
Lead Channel Pacing Threshold Amplitude: 0.5 V
Lead Channel Pacing Threshold Pulse Width: 0.5 ms
Lead Channel Pacing Threshold Pulse Width: 0.5 ms
Lead Channel Sensing Intrinsic Amplitude: 10.8 mV
Lead Channel Sensing Intrinsic Amplitude: 4.2 mV
Lead Channel Setting Pacing Amplitude: 0.75 V
Lead Channel Setting Pacing Amplitude: 1.5 V
Lead Channel Setting Pacing Pulse Width: 0.5 ms
Lead Channel Setting Sensing Sensitivity: 3.5 mV
Pulse Gen Model: 2272
Pulse Gen Serial Number: 9086757

## 2020-08-04 ENCOUNTER — Other Ambulatory Visit: Payer: 59

## 2020-08-04 ENCOUNTER — Other Ambulatory Visit: Payer: Self-pay

## 2020-08-04 DIAGNOSIS — E210 Primary hyperparathyroidism: Secondary | ICD-10-CM

## 2020-08-05 LAB — CREATININE, URINE, 24 HOUR: Creatinine, 24H Ur: 2.26 g/(24.h) — ABNORMAL HIGH (ref 0.50–2.15)

## 2020-08-05 LAB — CALCIUM, URINE, 24 HOUR: Calcium, 24H Urine: 256 mg/24 h

## 2020-08-22 NOTE — Progress Notes (Signed)
Remote pacemaker transmission.   

## 2020-09-18 ENCOUNTER — Encounter: Payer: Self-pay | Admitting: Internal Medicine

## 2020-10-15 ENCOUNTER — Other Ambulatory Visit: Payer: Self-pay

## 2020-10-15 ENCOUNTER — Ambulatory Visit (INDEPENDENT_AMBULATORY_CARE_PROVIDER_SITE_OTHER): Payer: 59 | Admitting: Sports Medicine

## 2020-10-15 ENCOUNTER — Ambulatory Visit (INDEPENDENT_AMBULATORY_CARE_PROVIDER_SITE_OTHER): Payer: 59

## 2020-10-15 DIAGNOSIS — M1612 Unilateral primary osteoarthritis, left hip: Secondary | ICD-10-CM

## 2020-10-15 NOTE — Progress Notes (Signed)
    Procedures performed today:    Procedure: Real-time Ultrasound Guided injection of the left hip joint Device: Samsung HS60  Verbal informed consent obtained.  Time-out conducted.  Noted no overlying erythema, induration, or other signs of local infection.  Skin prepped in a sterile fashion.  Local anesthesia: Topical Ethyl chloride.  With sterile technique and under real time ultrasound guidance: Noted arthritic hip joint, 1 cc Kenalog 40, 2 cc lidocaine, 2 cc bupivacaine injected easily Completed without difficulty  Advised to call if fevers/chills, erythema, induration, drainage, or persistent bleeding.  Images permanently stored and available for review in PACS.  Impression: Technically successful ultrasound guided injection.  Independent interpretation of notes and tests performed by another provider:   None.  Brief History, Exam, Impression, and Recommendations:    Primary osteoarthritis of left hip This is a very pleasant 65 year old male, known hip osteoarthritis, last injection in December of last year, repeat injection today, planning hip resurfacing procedure in November.  This is a chronic process with exacerbation and pharmacologic intervention.    ___________________________________________ Gwen Her. Dianah Field, M.D., ABFM., CAQSM. Primary Care and [place] Instructor of [employer] [facility] [employer]

## 2020-10-15 NOTE — Assessment & Plan Note (Addendum)
This is a very pleasant 65 year old male, known hip osteoarthritis, last injection in December of last year, repeat injection today, planning hip resurfacing procedure in November.  This is a chronic process with exacerbation and pharmacologic intervention.

## 2020-10-30 ENCOUNTER — Ambulatory Visit (INDEPENDENT_AMBULATORY_CARE_PROVIDER_SITE_OTHER): Payer: 59

## 2020-10-30 DIAGNOSIS — I442 Atrioventricular block, complete: Secondary | ICD-10-CM

## 2020-10-31 LAB — CUP PACEART REMOTE DEVICE CHECK
Battery Remaining Longevity: 94 mo
Battery Remaining Percentage: 77 %
Battery Voltage: 3.01 V
Brady Statistic AP VP Percent: 44 %
Brady Statistic AP VS Percent: 1 %
Brady Statistic AS VP Percent: 37 %
Brady Statistic AS VS Percent: 18 %
Brady Statistic RA Percent Paced: 44 %
Brady Statistic RV Percent Paced: 81 %
Date Time Interrogation Session: 20220811053834
Implantable Lead Implant Date: 20191112
Implantable Lead Implant Date: 20191112
Implantable Lead Location: 753859
Implantable Lead Location: 753860
Implantable Pulse Generator Implant Date: 20191112
Lead Channel Impedance Value: 400 Ohm
Lead Channel Impedance Value: 550 Ohm
Lead Channel Pacing Threshold Amplitude: 0.5 V
Lead Channel Pacing Threshold Amplitude: 0.5 V
Lead Channel Pacing Threshold Pulse Width: 0.5 ms
Lead Channel Pacing Threshold Pulse Width: 0.5 ms
Lead Channel Sensing Intrinsic Amplitude: 12 mV
Lead Channel Sensing Intrinsic Amplitude: 4.1 mV
Lead Channel Setting Pacing Amplitude: 0.75 V
Lead Channel Setting Pacing Amplitude: 1.5 V
Lead Channel Setting Pacing Pulse Width: 0.5 ms
Lead Channel Setting Sensing Sensitivity: 3.5 mV
Pulse Gen Model: 2272
Pulse Gen Serial Number: 9086757

## 2020-11-21 NOTE — Progress Notes (Signed)
Remote pacemaker transmission.   

## 2020-12-18 ENCOUNTER — Ambulatory Visit: Payer: 59 | Admitting: Sports Medicine

## 2020-12-25 ENCOUNTER — Encounter: Payer: Self-pay | Admitting: Internal Medicine

## 2020-12-31 ENCOUNTER — Telehealth: Payer: Self-pay | Admitting: *Deleted

## 2020-12-31 NOTE — Telephone Encounter (Signed)
Patrick Walter is calling back stating the patient is having a left hip resurfacing surgery in [place], where he will be staying overnight. The anesthesia is spinal sedation, he will be breathing on his own. The type depends on the anesthesiologist and the preforming provider is Dr. Lonzo Cloud.

## 2020-12-31 NOTE — Telephone Encounter (Signed)
Left message for surgery scheduler Crystal Spires, MA to please call our office to clarify information on clearance form.   Need to confirm the initials that are written in for procedure, need type of anesthesia being used and name of surgeon.

## 2020-12-31 NOTE — Telephone Encounter (Signed)
   [facility] Pre-operative Risk Assessment    Patient Name: Patrick Walter  DOB: [birth year] MRN: [id number]  HEARTCARE STAFF:  - IMPORTANT!!!!!! Under Visit Info/Reason for Call, type in Other and utilize the format Clearance MM/DD/YY or Clearance TBD. Do not use dashes or single digits. - Please review there is not already an duplicate clearance open for this procedure. - If request is for dental extraction, please clarify the # of teeth to be extracted. - If the patient is currently at the dentist's office, call Pre-Op Callback Staff (MA/nurse) to input urgent request.  - If the patient is not currently in the dentist office, please route to the Pre-Op pool.  Request for surgical clearance:  What type of surgery is being performed? LEFT HIP REPLACEMENT ARTHROSCOPY?  When is this surgery scheduled? 02/18/21  What type of clearance is required (medical clearance vs. Pharmacy clearance to hold med vs. Both)? MEDICAL  Are there any medications that need to be held prior to surgery and how long? ASA  Practice name and name of physician performing surgery? [facility]  What is the office phone number? [phone]   7.   What is the office fax number? [phone]  8.   Anesthesia type (None, local, MAC, general) ? NOT LISTED; LEFT MESSAGE FOR CALL BACK TO VERIFY INFORMATION AS CLEARANCE FORM IS NOT COMPLETE   Julaine Hua 12/31/2020, 10:40 AM  _________________________________________________________________   (provider comments below)

## 2020-12-31 NOTE — Telephone Encounter (Signed)
ADDENDUM:  PROCEDURE IS LEFT HIP RESURFACING  SURGERY ANESTHESIA IS SPINAL  SURGEON IS DR. THOMAS GROSS

## 2020-12-31 NOTE — Telephone Encounter (Signed)
   Name: Patrick Walter  DOB: 14-Jul-1955  MRN: [id number]  Primary Cardiologist: [name], MD  Chart reviewed as part of pre-operative protocol coverage. Because of [name]'s past medical history and time since last visit, he will require a follow-up visit in order to better assess preoperative cardiovascular risk.  Pre-op covering staff: - Please schedule appointment and call patient to inform them. If patient already had an upcoming appointment within acceptable timeframe, please add "pre-op clearance" to the appointment notes so provider is aware. - Please contact requesting surgeon's office via preferred method (i.e, phone, fax) to inform them of need for appointment prior to surgery.  If applicable, this message will also be routed to pharmacy pool and/or primary cardiologist for input on holding anticoagulant/antiplatelet agent as requested below so that this information is available to the clearing provider at time of patient's appointment.   [place], [place]  12/31/2020, 7:13 PM

## 2021-01-01 NOTE — Telephone Encounter (Signed)
Patient scheduled for an appointment on 01/08/21 at 10:15 AM with Tommye Standard, PA. Will route back to requesting surgeons office to make them aware.

## 2021-01-01 NOTE — Telephone Encounter (Signed)
[place] this patient is overdue for an appointment. He is scheduled to have surgery on 02/18/21 and will need a pre-op clearance appointment prior.  Thanks, Malakie Balis

## 2021-01-06 ENCOUNTER — Ambulatory Visit (INDEPENDENT_AMBULATORY_CARE_PROVIDER_SITE_OTHER): Payer: 59

## 2021-01-06 ENCOUNTER — Ambulatory Visit (INDEPENDENT_AMBULATORY_CARE_PROVIDER_SITE_OTHER): Payer: 59 | Admitting: Sports Medicine

## 2021-01-06 ENCOUNTER — Encounter: Payer: Self-pay | Admitting: Sports Medicine

## 2021-01-06 ENCOUNTER — Other Ambulatory Visit: Payer: Self-pay

## 2021-01-06 VITALS — BP 155/82 | HR 82 | Ht 71.0 in | Wt 198.0 lb

## 2021-01-06 DIAGNOSIS — N139 Obstructive and reflux uropathy, unspecified: Secondary | ICD-10-CM

## 2021-01-06 DIAGNOSIS — Z01818 Encounter for other preprocedural examination: Secondary | ICD-10-CM | POA: Diagnosis not present

## 2021-01-06 DIAGNOSIS — E782 Mixed hyperlipidemia: Secondary | ICD-10-CM | POA: Diagnosis not present

## 2021-01-06 DIAGNOSIS — M1612 Unilateral primary osteoarthritis, left hip: Secondary | ICD-10-CM

## 2021-01-06 DIAGNOSIS — D649 Anemia, unspecified: Secondary | ICD-10-CM

## 2021-01-06 NOTE — Assessment & Plan Note (Signed)
Preoperative clearance today for left hip resurfacing. This will be done in [place]. He does have a history of aortic valve replacement, he has ventricular pacing and his ECG reflects this, unchanged from prior. He has greater than 4 metabolic equivalents of cardiac capacity. Adding a chest x-ray. Not on any blood thinners. He also has follow-up with his cardiologist. From a primary care standpoint he is cleared for intermediate risk noncardiac surgery.

## 2021-01-06 NOTE — Addendum Note (Signed)
Addended by: Narda Rutherford on: 01/06/2021 01:56 PM   Modules accepted: Orders

## 2021-01-06 NOTE — Progress Notes (Signed)
    Procedures performed today:    ECG personally reviewed, he has ventricular pacing, rate of 72 bpm, unchanged from prior.  Independent interpretation of notes and tests performed by another provider:   None.  Brief History, Exam, Impression, and Recommendations:    Primary osteoarthritis of left hip Preoperative clearance today for left hip resurfacing. This will be done in [place]. He does have a history of aortic valve replacement, he has ventricular pacing and his ECG reflects this, unchanged from prior. He has greater than 4 metabolic equivalents of cardiac capacity. Adding a chest x-ray. Not on any blood thinners. He also has follow-up with his cardiologist. From a primary care standpoint he is cleared for intermediate risk noncardiac surgery.  I spent 30 minutes of total time managing this patient today, this includes chart review, face to face, and non-face to face time.  Specifically we spent time discussing operative risk.  ___________________________________________ Gwen Her. Dianah Field, M.D., ABFM., CAQSM. Primary Care and [place] Instructor of [place] [employer] [facility] [employer]

## 2021-01-07 LAB — CBC
HCT: 44 % (ref 38.5–50.0)
Hemoglobin: 14.7 g/dL (ref 13.2–17.1)
MCH: 29.6 pg (ref 27.0–33.0)
MCHC: 33.4 g/dL (ref 32.0–36.0)
MCV: 88.7 fL (ref 80.0–100.0)
MPV: 11.7 fL (ref 7.5–12.5)
Platelets: 238 10*3/uL (ref 140–400)
RBC: 4.96 10*6/uL (ref 4.20–5.80)
RDW: 12.8 % (ref 11.0–15.0)
WBC: 5.1 10*3/uL (ref 3.8–10.8)

## 2021-01-07 LAB — COMPREHENSIVE METABOLIC PANEL
AG Ratio: 1.7 (calc) (ref 1.0–2.5)
ALT: 18 U/L (ref 9–46)
AST: 19 U/L (ref 10–35)
Albumin: 4.5 g/dL (ref 3.6–5.1)
Alkaline phosphatase (APISO): 63 U/L (ref 35–144)
BUN: 21 mg/dL (ref 7–25)
CO2: 27 mmol/L (ref 20–32)
Calcium: 10.7 mg/dL — ABNORMAL HIGH (ref 8.6–10.3)
Chloride: 107 mmol/L (ref 98–110)
Creat: 1.04 mg/dL (ref 0.70–1.35)
Globulin: 2.6 g/dL (calc) (ref 1.9–3.7)
Glucose, Bld: 99 mg/dL (ref 65–99)
Potassium: 4.2 mmol/L (ref 3.5–5.3)
Sodium: 142 mmol/L (ref 135–146)
Total Bilirubin: 0.9 mg/dL (ref 0.2–1.2)
Total Protein: 7.1 g/dL (ref 6.1–8.1)

## 2021-01-07 LAB — PSA, TOTAL AND FREE
PSA, % Free: 25 % (calc) — ABNORMAL LOW (ref >25)
PSA, Free: 0.2 ng/mL
PSA, Total: 0.8 ng/mL (ref <=4.0)

## 2021-01-07 LAB — LIPID PANEL
Cholesterol: 200 mg/dL — ABNORMAL HIGH (ref <200)
HDL: 54 mg/dL (ref >=40)
LDL Cholesterol (Calc): 129 mg/dL (calc) — ABNORMAL HIGH
Non-HDL Cholesterol (Calc): 146 mg/dL (calc) — ABNORMAL HIGH (ref <130)
Total CHOL/HDL Ratio: 3.7 (calc) (ref <5.0)
Triglycerides: 74 mg/dL (ref <150)

## 2021-01-07 LAB — PROTIME-INR
INR: 1
Prothrombin Time: 10.2 s (ref 9.0–11.5)

## 2021-01-07 LAB — ABO AND RH

## 2021-01-07 LAB — APTT: aPTT: 33 s — ABNORMAL HIGH (ref 23–32)

## 2021-01-07 LAB — HEMOGLOBIN A1C
Hgb A1c MFr Bld: 5.1 % of total Hgb (ref <5.7)
Mean Plasma Glucose: 100 mg/dL
eAG (mmol/L): 5.5 mmol/L

## 2021-01-07 LAB — TSH: TSH: 2.33 mIU/L (ref 0.40–4.50)

## 2021-01-07 NOTE — Progress Notes (Signed)
Cardiology Office Note Date:  01/07/2021  Patient ID:  Patrick Walter, Patrick Walter Sep 08, 1955, MRN [id number] PCP:  [name], MD  Cardiologist:  Dr. Burt Knack Electrophysiologist: Dr. Rayann Heman    Chief Complaint:  pre-op, L hip resurfacing surgery w/spinal anesthesia  History of Present Illness: Patrick Walter is a 65 y.o. male with history of VHD, PFO, aortic aneurysm s/p AVR/PFO closure and aneurysm repair  (bioprosthetic AVR and PFO closure 2015 > staph endocarditis > redo AVR and repair of ascending aortic aneurysm 2019), HTN, advanced heart block w/PPM  He comes in today for pre-op evaluation.  He was last seen by cardiology service, by Charlsie Quest, PA-C Dec 2019, he was post hospital after his redo AVR/ aneurysm repair  More recently and last clinic visit here was with Dr. Rayann Heman Feb 2020.  At that time was doing very well, exercising regularly without complaints He had regained some Av conduction and VIP was turned on  TODAY Outside of his hip pain he feels really well. Despite his hip, he walks miles avery day without any CP, palpitations or cardiac awareness Denies any SOB, DOE. No difficulties with his ADLs No dizzy spells, near syncope or syncope.  He has gotten lost to follow up here 2/2 COVID restrictions last year or 2   RCRI score is zero, 0.4% DUKE: 8.97METS  Device information SJM dual chamber PPM implanted 01/31/2018 for post-op CHB   Past Medical History:  Diagnosis Date   Aortic stenosis    Ascending aorta enlargement (HCC)    Bicuspid aortic valve    Blood transfusion without reported diagnosis    Heart murmur    Hypertension    Patent foramen ovale    Closed at the time of aortic valve replacement   Pilonidal cyst    S/P aortic valve replacement with bioprosthetic valve 12/05/2013   25 mm [facility] Ease bovine pericardial tissue valve    S/P redo aortic valve replacement with human allograft aortic root graft 01/26/2018   Septic embolism  (HCC)    Brain, spleen, superior mesenteric artery    Past Surgical History:  Procedure Laterality Date   AORTIC VALVE REPLACEMENT N/A 12/05/2013   Procedure: AORTIC VALVE REPLACEMENT (AVR);  Surgeon: [name], MD;  Location: [place];  Service: Open Heart Surgery;  Laterality: N/A;   AORTIC VALVE REPLACEMENT N/A 01/26/2018   Procedure: REDO STERNOTOMY, REDO AORTIC VALVE REPLACEMENT WITH HUMAN ALLOGRAFT INCLUDING AORTIC ROOT REPLACEMENT,  REPAIR ASCENDING THORACIC AORTIC ANEURYSM;  Surgeon: [name], MD;  Location: [place];  Service: Open Heart Surgery;  Laterality: N/A;   HAND SURGERY Left 07/1977   INTRAOPERATIVE TRANSESOPHAGEAL ECHOCARDIOGRAM N/A 12/05/2013   Procedure: INTRAOPERATIVE TRANSESOPHAGEAL ECHOCARDIOGRAM;  Surgeon: [name], MD;  Location: [place];  Service: Open Heart Surgery;  Laterality: N/A;   LEFT AND RIGHT HEART CATHETERIZATION WITH CORONARY ANGIOGRAM N/A 11/08/2013   Procedure: LEFT AND RIGHT HEART CATHETERIZATION WITH CORONARY ANGIOGRAM;  Surgeon: Blane Ohara, MD;  Location: [facility] CATH LAB;  Service: Cardiovascular;  Laterality: N/A;   PACEMAKER IMPLANT N/A 01/31/2018   SJM Assurity MRI DR implanted by Dr Rayann Heman for complete heart block   PATENT FORAMEN OVALE CLOSURE N/A 12/05/2013   Procedure: PATENT FORAMEN OVALE CLOSURE;  Surgeon: [name], MD;  Location: [place];  Service: Open Heart Surgery;  Laterality: N/A;   PILONIDAL CYST / SINUS EXCISION  01/20/2018   Dr Brantley Stage   TEE WITHOUT CARDIOVERSION N/A 10/23/2013   Procedure: TRANSESOPHAGEAL ECHOCARDIOGRAM (  TEE);  Surgeon: Larey Dresser, MD;  Location: [place];  Service: Cardiovascular;  Laterality: N/A;   TEE WITHOUT CARDIOVERSION N/A 01/19/2018   Procedure: TRANSESOPHAGEAL ECHOCARDIOGRAM (TEE);  Surgeon: Sanda Klein, MD;  Location: [place];  Service: Cardiovascular;  Laterality: N/A;   TEE WITHOUT CARDIOVERSION N/A 01/26/2018   Procedure: TRANSESOPHAGEAL ECHOCARDIOGRAM (TEE);  Surgeon:  [name], MD;  Location: [place];  Service: Open Heart Surgery;  Laterality: N/A;   VASECTOMY  06/2002    Current Outpatient Medications  Medication Sig Dispense Refill   allopurinol (ZYLOPRIM) 300 MG tablet Take 1 tablet (300 mg total) by mouth daily. (Patient not taking: No sig reported) 30 tablet 3   amoxicillin (AMOXIL) 500 MG tablet 4 tabs (2000mg ) 1 hours prior to procedure 4 tablet 3   Ascorbic Acid (VITAMIN C) 1000 MG tablet Take 1,000 mg by mouth daily.     aspirin EC 81 MG tablet Take 81 mg by mouth daily.     bisacodyl (DULCOLAX) 5 MG EC tablet Take 10 mg by mouth daily as needed for moderate constipation (take with iron).     Boswellia-Glucosamine-Vit D (GLUCOSAMINE COMPLEX PO) Take 1 capsule by mouth daily.     celecoxib (CELEBREX) 200 MG capsule TAKE 1-2 CAPSULES (200-400 MG TOTAL) BY MOUTH DAILY AS NEEDED FOR MILD PAIN. 60 capsule 11   Coenzyme Q10 300 MG CAPS Take 1 capsule by mouth daily.     COLLAGEN PO Take by mouth.     Multiple Vitamin (MULTIVITAMIN WITH MINERALS) TABS tablet Take 1 tablet by mouth daily.     Omega 3 1000 MG CAPS Take 3 capsules by mouth daily.     Probiotic Product (PROBIOTIC PO) Take 1 capsule by mouth daily.     TURMERIC PO Take 1 capsule by mouth daily.     No current facility-administered medications for this visit.    Allergies:   Patient has no known allergies.   Social History:  The patient  reports that he has never smoked. He has never used smokeless tobacco. He reports current alcohol use. He reports that he does not use drugs.   Family History:  The patient's family history includes Cancer in his daughter, father, maternal grandfather, maternal grandmother, mother, paternal grandfather, and paternal grandmother; Colon cancer (age of onset: 56) in his paternal grandmother; Colon cancer (age of onset: 5) in his father; Colon polyps in his father and paternal grandmother; Diabetes in his father, maternal grandfather, maternal  grandmother, mother, paternal grandfather, and paternal grandmother; Heart attack in his maternal grandmother; Hypertension in his maternal grandmother; Stroke in his paternal grandfather.  ROS:  Please see the history of present illness.    All other systems are reviewed and otherwise negative.   PHYSICAL EXAM:  VS:  There were no vitals taken for this visit. BMI: There is no height or weight on file to calculate BMI. Well nourished, well developed, in no acute distress HEENT: normocephalic, atraumatic Neck: no JVD, carotid bruits or masses Cardiac:  RRR; no significant murmurs are appreciated, no rubs, or gallops Lungs:  CTA b/l, no wheezing, rhonchi or rales Abd: soft, nontender MS: no deformity or atrophy Ext: no edema Skin: warm and dry, no rash Neuro:  No gross deficits appreciated Psych: euthymic mood, full affect  PPM site is stable, no tethering or discomfort   EKG:  01/06/21 EKG is personally reviewed  SR/V paced 72bpm, rhythm stripe noted a PVC  Device interrogation: Battery and lead measurements are good  He has intrinsic rhythm at 42 underlying One brief AMS back in march 2020, 2 seconds Looks a brief AT Has had PMT, , last over a year ago   05/23/2018 TTE IMPRESSIONS   1. The left ventricle has low normal systolic function, with an ejection  fraction of 50-55%. The cavity size was mildly dilated. Left ventricular  diastolic parameters were normal There is abnormal septal motion  consistent with RV pacemaker.   2. The right ventricle has mildly reduced systolic function. The cavity  was mildly enlarged. There is no increase in right ventricular wall  thickness. Right ventricular systolic pressure is normal with an estimated  pressure of 25.8 mmHg.   3. Left atrial size was mildly dilated.   4. Right atrial size was mildly dilated.   5. The mitral valve is normal in structure. Mild thickening of the mitral  valve leaflet. There is mild mitral annular  calcification present. Mitral  valve regurgitation is mild to moderate by color flow Doppler. The MR jet  is eccentric medially directed.   6. The tricuspid valve is normal in structure.   7. A Human allograft valve is present in the aortic position (including  aortic root replacement, repair ascending thoracic aortic aneurysm;  Procedure Date: 01/26/2018).   8. Normal aortic valve prosthesis. Peak and mean gradients are 12 and 7  mm Hg, respectively.   9. Aortic valve regurgitation is trivial by color flow Doppler.  10. The pulmonic valve was normal in structure.  11. The aortic root and ascending aorta are normal in size and structure.     Recent Labs: 01/06/2021: ALT 18; BUN 21; Creat 1.04; Hemoglobin 14.7; Platelets 238; Potassium 4.2; Sodium 142; TSH 2.33  01/06/2021: Cholesterol 200; HDL 54; LDL Cholesterol (Calc) 129; Total CHOL/HDL Ratio 3.7; Triglycerides 74   Estimated Creatinine Clearance: 77.4 mL/min (by C-G formula based on SCr of 1.04 mg/dL).   Wt Readings from Last 3 Encounters:  01/06/21 198 lb (89.8 kg)  03/26/20 194 lb (88 kg)  12/12/19 196 lb (88.9 kg)     Other studies reviewed: Additional studies/records reviewed today include: summarized above  ASSESSMENT AND PLAN:  PPM Intact function, no programming changes made Need routine peri-operative pacemaker management  VHD S/p AVR/root with re-do operation in 2019 2/2 endocarditis.   Also has had PFO closure and thoracic ascending aortic aneurysm repair No symptoms or exam findings to suggest any clinical changes I have reached out to our structural heart team APP, no need for pre-operative prophylaxis for hip surgery   3. HTN No changes today  4. Pre-op for hip surgery I have discussed case with structuarl heart team APP, desopite prior staph endocarditis, no need for prophylaxis for hip surgery No cardiac contraindication for suregy planned He is low cardiac risk score for surgery planned, excellent  exertional capacity He will need routine peri-operative pacemaker management  Disposition: F/u with remotes as usual and EP in a year, sooner if needed.  Will gethim re-established with Dr. Burt Knack, non-urgent, next available, pt would like to see Dr. Burt Knack  Current medicines are reviewed at length with the patient today.  The patient did not have any concerns regarding medicines.  Venetia Night, PA-C 01/07/2021 10:36 AM     [facility] [place] [address] [phone] (office)  [phone] (fax)

## 2021-01-08 ENCOUNTER — Ambulatory Visit (INDEPENDENT_AMBULATORY_CARE_PROVIDER_SITE_OTHER): Payer: 59 | Admitting: Physician Assistant

## 2021-01-08 ENCOUNTER — Encounter: Payer: Self-pay | Admitting: Physician Assistant

## 2021-01-08 ENCOUNTER — Other Ambulatory Visit: Payer: Self-pay

## 2021-01-08 ENCOUNTER — Other Ambulatory Visit (HOSPITAL_COMMUNITY): Payer: Self-pay

## 2021-01-08 ENCOUNTER — Telehealth: Payer: Self-pay | Admitting: Cardiovascular Disease

## 2021-01-08 VITALS — BP 144/80 | HR 84 | Ht 71.0 in | Wt 198.0 lb

## 2021-01-08 DIAGNOSIS — Z01810 Encounter for preprocedural cardiovascular examination: Secondary | ICD-10-CM | POA: Diagnosis not present

## 2021-01-08 DIAGNOSIS — Z950 Presence of cardiac pacemaker: Secondary | ICD-10-CM | POA: Diagnosis not present

## 2021-01-08 DIAGNOSIS — I10 Essential (primary) hypertension: Secondary | ICD-10-CM

## 2021-01-08 DIAGNOSIS — Z954 Presence of other heart-valve replacement: Secondary | ICD-10-CM | POA: Diagnosis not present

## 2021-01-08 DIAGNOSIS — Q2112 Patent foramen ovale: Secondary | ICD-10-CM | POA: Diagnosis not present

## 2021-01-08 DIAGNOSIS — Z01818 Encounter for other preprocedural examination: Secondary | ICD-10-CM

## 2021-01-08 LAB — CUP PACEART INCLINIC DEVICE CHECK
Battery Remaining Longevity: 91 mo
Battery Voltage: 3.01 V
Brady Statistic RA Percent Paced: 44 %
Brady Statistic RV Percent Paced: 82 %
Date Time Interrogation Session: 20221020192727
Implantable Lead Implant Date: 20191112
Implantable Lead Implant Date: 20191112
Implantable Lead Location: 753859
Implantable Lead Location: 753860
Implantable Pulse Generator Implant Date: 20191112
Lead Channel Impedance Value: 387.5 Ohm
Lead Channel Impedance Value: 562.5 Ohm
Lead Channel Pacing Threshold Amplitude: 0.5 V
Lead Channel Pacing Threshold Amplitude: 0.5 V
Lead Channel Pacing Threshold Pulse Width: 0.5 ms
Lead Channel Pacing Threshold Pulse Width: 0.5 ms
Lead Channel Sensing Intrinsic Amplitude: 12 mV
Lead Channel Sensing Intrinsic Amplitude: 4.2 mV
Lead Channel Setting Pacing Amplitude: 0.75 V
Lead Channel Setting Pacing Amplitude: 1.5 V
Lead Channel Setting Pacing Pulse Width: 0.5 ms
Lead Channel Setting Sensing Sensitivity: 3.5 mV
Pulse Gen Model: 2272
Pulse Gen Serial Number: 9086757

## 2021-01-08 NOTE — Patient Instructions (Signed)
Medication Instructions:   Your physician recommends that you continue on your current medications as directed. Please refer to the Current Medication list given to you today.   *If you need a refill on your cardiac medications before your next appointment, please call your pharmacy*   Lab Work: [place]   If you have labs (blood work) drawn today and your tests are completely normal, you will receive your results only by: [place] (if you have MyChart) OR A paper copy in the mail If you have any lab test that is abnormal or we need to change your treatment, we will call you to review the results.   Testing/Procedures: NONE ORDERED  TODAY     Follow-Up: At [facility], you and your health needs are our priority.  As part of our continuing mission to provide you with exceptional heart care, we have created designated Provider Care Teams.  These Care Teams include your primary Cardiologist (physician) and Advanced Practice Providers (APPs -  Physician Assistants and Nurse Practitioners) who all work together to provide you with the care you need, when you need it.  We recommend signing up for the patient portal called "MyChart".  Sign up information is provided on this After Visit Summary.  MyChart is used to connect with patients for Virtual Visits (Telemedicine).  Patients are able to view lab/test results, encounter notes, upcoming appointments, etc.  Non-urgent messages can be sent to your provider as well.   To learn more about what you can do with MyChart, go to [email or web address].    Your next appointment:  WITH DR Burt Knack ONLY NEXT AVAILABLE  1 year(s)  The format for your next appointment:   In Person  Provider:   You will see one of the following Advanced Practice Providers on your designated Care Team:   Tommye Standard, [place] Legrand Como "Jonni Sanger" Chalmers Cater, [place]     Other Instructions

## 2021-01-08 NOTE — Telephone Encounter (Signed)
Spoke with Tommye Standard PA-C about time frame for the pt to follow-up with Dr. Burt Knack.  Okay to recall the pt to see Dr. Burt Knack for sometime after April 2023, just note in recall for pt "to only see Dr. Burt Knack." Recall placed.  Pt will be called by scheduling team accordingly, when April 2023 schedule for Dr. Burt Knack is open.

## 2021-01-08 NOTE — Telephone Encounter (Signed)
Patrick Walter had an appt with Tommye Standard today for his Pacemaker. After the visit the AVS said for him to follow up with Dr Burt Knack only. Patient has not seen Dr Burt Knack since 2018. He needs to be scheduled per AVS and there is no availability  through April 2023. Please advise patient on what to do and get him scheduled.

## 2021-01-09 NOTE — Telephone Encounter (Signed)
This is fine thanks. I know the patient well and would be happy to see him next available.

## 2021-01-13 NOTE — Telephone Encounter (Signed)
Pt seen on 01/08/21 by provider Tommye Standard for surgical clearance. Will route to callback team to assist in making sure their OV note gets received by requesting party. No additional APP input needed at this time. Please make sure patient gets reply when this is confirmed. Thx.

## 2021-01-28 ENCOUNTER — Ambulatory Visit: Payer: 59 | Admitting: Internal Medicine

## 2021-01-28 ENCOUNTER — Other Ambulatory Visit: Payer: Self-pay

## 2021-01-28 ENCOUNTER — Encounter: Payer: Self-pay | Admitting: Internal Medicine

## 2021-01-28 VITALS — BP 140/84 | HR 80 | Ht 71.0 in | Wt 198.0 lb

## 2021-01-28 DIAGNOSIS — E210 Primary hyperparathyroidism: Secondary | ICD-10-CM

## 2021-01-28 LAB — VITAMIN D 25 HYDROXY (VIT D DEFICIENCY, FRACTURES): VITD: 44.04 ng/mL (ref 30.00–100.00)

## 2021-01-28 NOTE — Progress Notes (Signed)
Name: Patrick Walter  MRN/ DOB: [id number], 11/01/1955    Age/ Sex: 65 y.o., male     PCP: [name], MD   Reason for Endocrinology Evaluation: Hypercalcemia      Initial Endocrinology Clinic Visit: 03/26/2020    PATIENT IDENTIFIER: Patrick Walter is a 65 y.o., male with a past medical history of history of Aortic valve replacement. He has followed with [facility] since 03/26/2020 for consultative assistance with management of his hypercalcemia    HISTORICAL SUMMARY: The patient was first diagnosed with hypercalcemia in 2019. With a max serum calcium of 11.4 mg/dL and a max PTH of 103 pg/mL in 09/2019.   He denies  history of kidney stones, kidney disease, liver disease, granulomatous disease  DXA showed low bone density 04/04/2020  24 hr urinary excretion of calcium was 360 mg ( 03/2020)  repeat decreased to 256 mg by 07/2020 KUB showed renal stone 7 mm   SUBJECTIVE:     Today (01/28/2021):  [name] is here for a follow up on Primary hyperparathyroidism.   Pending left hip resurfacing in [place] Denies polyuria ,or frequency No renal stones  Consumes at least 2 servings of calcium daily    Vitamin D 5000  iu daily   HISTORY:  Past Medical History:  Past Medical History:  Diagnosis Date   Aortic stenosis    Ascending aorta enlargement (HCC)    Bicuspid aortic valve    Blood transfusion without reported diagnosis    Heart murmur    Hypertension    Patent foramen ovale    Closed at the time of aortic valve replacement   Pilonidal cyst    S/P aortic valve replacement with bioprosthetic valve 12/05/2013   25 mm [facility] Ease bovine pericardial tissue valve    S/P redo aortic valve replacement with human allograft aortic root graft 01/26/2018   Septic embolism (HCC)    Brain, spleen, superior mesenteric artery   Past Surgical History:  Past Surgical History:  Procedure Laterality Date   AORTIC VALVE REPLACEMENT N/A 12/05/2013    Procedure: AORTIC VALVE REPLACEMENT (AVR);  Surgeon: [name], MD;  Location: [place];  Service: Open Heart Surgery;  Laterality: N/A;   AORTIC VALVE REPLACEMENT N/A 01/26/2018   Procedure: REDO STERNOTOMY, REDO AORTIC VALVE REPLACEMENT WITH HUMAN ALLOGRAFT INCLUDING AORTIC ROOT REPLACEMENT,  REPAIR ASCENDING THORACIC AORTIC ANEURYSM;  Surgeon: [name], MD;  Location: [place];  Service: Open Heart Surgery;  Laterality: N/A;   HAND SURGERY Left 07/1977   INTRAOPERATIVE TRANSESOPHAGEAL ECHOCARDIOGRAM N/A 12/05/2013   Procedure: INTRAOPERATIVE TRANSESOPHAGEAL ECHOCARDIOGRAM;  Surgeon: [name], MD;  Location: [place];  Service: Open Heart Surgery;  Laterality: N/A;   LEFT AND RIGHT HEART CATHETERIZATION WITH CORONARY ANGIOGRAM N/A 11/08/2013   Procedure: LEFT AND RIGHT HEART CATHETERIZATION WITH CORONARY ANGIOGRAM;  Surgeon: Blane Ohara, MD;  Location[facility] CATH LAB;  Service: Cardiovascular;  Laterality: N/A;   PACEMAKER IMPLANT N/A 01/31/2018   SJM Assurity MRI DR implanted by Dr Rayann Heman for complete heart block   PATENT FORAMEN OVALE CLOSURE N/A 12/05/2013   Procedure: PATENT FORAMEN OVALE CLOSURE;  Surgeon: [name], MD;  Location: [place];  Service: Open Heart Surgery;  Laterality: N/A;   PILONIDAL CYST / SINUS EXCISION  01/20/2018   Dr Brantley Stage   TEE WITHOUT CARDIOVERSION N/A 10/23/2013   Procedure: TRANSESOPHAGEAL ECHOCARDIOGRAM (TEE);  Surgeon: Larey Dresser, MD;  Location: [place];  Service: Cardiovascular;  Laterality:  N/A;   TEE WITHOUT CARDIOVERSION N/A 01/19/2018   Procedure: TRANSESOPHAGEAL ECHOCARDIOGRAM (TEE);  Surgeon: Sanda Klein, MD;  Location: [place];  Service: Cardiovascular;  Laterality: N/A;   TEE WITHOUT CARDIOVERSION N/A 01/26/2018   Procedure: TRANSESOPHAGEAL ECHOCARDIOGRAM (TEE);  Surgeon: [name], MD;  Location: [place];  Service: Open Heart Surgery;  Laterality: N/A;   VASECTOMY  06/2002   Social History:  reports that he has  never smoked. He has never used smokeless tobacco. He reports current alcohol use. He reports that he does not use drugs. Family History:  Family History  Problem Relation Age of Onset   Diabetes Mother    Cancer Mother        colon   Diabetes Father    Cancer Father        colon   Colon cancer Father 54   Colon polyps Father    Diabetes Paternal Grandmother    Cancer Paternal Grandmother    Colon cancer Paternal Grandmother 8   Colon polyps Paternal Grandmother    Cancer Daughter        leukemia   Heart attack Maternal Grandmother    Hypertension Maternal Grandmother    Diabetes Maternal Grandmother    Cancer Maternal Grandmother    Diabetes Maternal Grandfather    Cancer Maternal Grandfather    Stroke Paternal Grandfather    Diabetes Paternal Grandfather    Cancer Paternal Grandfather    Esophageal cancer Neg Hx    Stomach cancer Neg Hx      HOME MEDICATIONS: Allergies as of 01/28/2021   No Known Allergies      Medication List        Accurate as of January 28, 2021  1:05 PM. If you have any questions, ask your nurse or doctor.          STOP taking these medications    amoxicillin 500 MG tablet Commonly known as: AMOXIL Stopped by: Dorita Sciara, MD       TAKE these medications    allopurinol 300 MG tablet Commonly known as: ZYLOPRIM Take 1 tablet (300 mg total) by mouth daily.   aspirin EC 81 MG tablet Take 81 mg by mouth daily.   bisacodyl 5 MG EC tablet Commonly known as: DULCOLAX Take 10 mg by mouth daily as needed for moderate constipation (take with iron).   celecoxib 200 MG capsule Commonly known as: CELEBREX TAKE 1-2 CAPSULES (200-400 MG TOTAL) BY MOUTH DAILY AS NEEDED FOR MILD PAIN.   Coenzyme Q10 300 MG Caps Take 1 capsule by mouth daily.   COLLAGEN PO Take by mouth.   GLUCOSAMINE COMPLEX PO Take 1 capsule by mouth daily.   multivitamin with minerals Tabs tablet Take 1 tablet by mouth daily.   Omega 3 1000 MG  Caps Take 3 capsules by mouth daily.   PROBIOTIC PO Take 1 capsule by mouth daily.   TURMERIC PO Take 1 capsule by mouth daily.   vitamin C 1000 MG tablet Take 1,000 mg by mouth daily.          OBJECTIVE:   PHYSICAL EXAM: VS: BP 140/84 (BP Location: Left Arm, Patient Position: Sitting, Cuff Size: Small)   Pulse 80   Ht 5\' 11"  (1.803 m)   Wt 198 lb (89.8 kg)   SpO2 99%   BMI 27.62 kg/m    EXAM: General: Pt appears well and is in NAD  Neck: General: Supple without adenopathy. Thyroid: Thyroid size normal.  No goiter or  nodules appreciated.   Lungs: Clear with good BS bilat with no rales, rhonchi, or wheezes  Heart: Auscultation: RRR.  Abdomen: Normoactive bowel sounds, soft, nontender, without masses or organomegaly palpable  Extremities:  BL LE: No pretibial edema normal ROM and strength.  Mental Status: Judgment, insight: Intact Orientation: Oriented to time, place, and person Mood and affect: No depression, anxiety, or agitation     DATA REVIEWED:  Results for THAILAN, SAVA (MRN [id number]) as of 01/30/2021 12:19  Ref. Range 01/28/2021 11:17  Calcium Ionized Latest Ref Range: 4.8 - 5.6 mg/dL 5.89 (H)  VITD Latest Ref Range: 30.00 - 100.00 ng/mL 44.04  PTH, Intact Latest Ref Range: 16 - 77 pg/mL 83 (H)    Results for KHALEL, ALMS (MRN [id number]) as of 01/28/2021 13:06  Ref. Range 01/06/2021 00:00  Sodium Latest Ref Range: 135 - 146 mmol/L 142  Potassium Latest Ref Range: 3.5 - 5.3 mmol/L 4.2  Chloride Latest Ref Range: 98 - 110 mmol/L 107  CO2 Latest Ref Range: 20 - 32 mmol/L 27  Glucose Latest Ref Range: 65 - 99 mg/dL 99  Mean Plasma Glucose Latest Units: mg/dL 100  BUN Latest Ref Range: 7 - 25 mg/dL 21  Creatinine Latest Ref Range: 0.70 - 1.35 mg/dL 1.04  Calcium Latest Ref Range: 8.6 - 10.3 mg/dL 10.7 (H)  BUN/Creatinine Ratio Latest Ref Range: 6 - 22 (calc) NOT APPLICABLE  AG Ratio Latest Ref Range: 1.0 - 2.5 (calc) 1.7  AST Latest Ref Range:  10 - 35 U/L 19  ALT Latest Ref Range: 9 - 46 U/L 18  Total Protein Latest Ref Range: 6.1 - 8.1 g/dL 7.1  Total Bilirubin Latest Ref Range: 0.2 - 1.2 mg/dL 0.9   Results for DEMARIS, LEAVELL (MRN [id number]) as of 01/28/2021 07:58  Ref. Range 03/31/2020 11:04 08/04/2020 12:22  Calcium, 24H Urine Latest Units: mg/24 h 360 (H) 256    KUB 07/24/2020 Both kidneys are largely obscured by bowel contents. There is a 7 mm stone projected over the lower pole the left kidney. No renal or ureteral stones.   IMPRESSION: A 7 mm calcification on the left is likely a stone in the lower pole of the left kidney.   ASSESSMENT / PLAN / RECOMMENDATIONS:   Primary Hyperparathyroidism:   -Patient asymptomatic Pt meets surgical criteria due to presence of left renal stone on KUB -DXA shows low bone density  - 24-hr urine calcium excretion was high early on but this has trended down, he would like to focus on his upcoming hip sx at this time, I have offered him HCTZ to reduce urinary excretion of calcium and to prevent renal stones, he is not too keen on this . We have planned to explore this again on next visit   Recommendations - Encouraged hydration  - AVOID CALCIUM SUPPLEMENTS, AVOID LOW CALCIUM DIET - Maintain normal dietary calcium intake (2-3 servings of dairy a day)    Follow-up in 6 months  Signed electronically by: Mack Guise, MD  [facility] End[facility]  [address] [place], [place] [address] Phone: [phone] FAX: [phone]      CC: [name], [facility] [address] [place] [address] Phone: [phone]  Fax: [phone]   Return to Endocrinology clinic as below: Future Appointments  Date Time Provider [place]  01/29/2021  7:50 AM [facility] DEVICE REMOTES [facility]  04/30/2021  7:50 AM [facility] DEVICE REMOTES [facility]  07/30/2021  7:50  AM [facility]  DEVICE REMOTES [facility]  07/30/2021 10:50 AM Becki Mccaskill, Melanie Crazier, MD [facility] None

## 2021-01-28 NOTE — Patient Instructions (Signed)
-   Please stay hydrated - Avoid over the counter calcium tablets - Consume 2-3 servings of calcium in your diet

## 2021-01-29 ENCOUNTER — Ambulatory Visit (INDEPENDENT_AMBULATORY_CARE_PROVIDER_SITE_OTHER): Payer: 59

## 2021-01-29 DIAGNOSIS — I442 Atrioventricular block, complete: Secondary | ICD-10-CM | POA: Diagnosis not present

## 2021-01-29 LAB — CUP PACEART REMOTE DEVICE CHECK
Battery Remaining Longevity: 91 mo
Battery Remaining Percentage: 74 %
Battery Voltage: 3.01 V
Brady Statistic AP VP Percent: 34 %
Brady Statistic AP VS Percent: 1 %
Brady Statistic AS VP Percent: 65 %
Brady Statistic AS VS Percent: 1 %
Brady Statistic RA Percent Paced: 33 %
Brady Statistic RV Percent Paced: 99 %
Date Time Interrogation Session: 20221110020029
Implantable Lead Implant Date: 20191112
Implantable Lead Implant Date: 20191112
Implantable Lead Location: 753859
Implantable Lead Location: 753860
Implantable Pulse Generator Implant Date: 20191112
Lead Channel Impedance Value: 390 Ohm
Lead Channel Impedance Value: 550 Ohm
Lead Channel Pacing Threshold Amplitude: 0.5 V
Lead Channel Pacing Threshold Amplitude: 0.5 V
Lead Channel Pacing Threshold Pulse Width: 0.5 ms
Lead Channel Pacing Threshold Pulse Width: 0.5 ms
Lead Channel Sensing Intrinsic Amplitude: 11.2 mV
Lead Channel Sensing Intrinsic Amplitude: 3.6 mV
Lead Channel Setting Pacing Amplitude: 0.75 V
Lead Channel Setting Pacing Amplitude: 1.5 V
Lead Channel Setting Pacing Pulse Width: 0.5 ms
Lead Channel Setting Sensing Sensitivity: 3.5 mV
Pulse Gen Model: 2272
Pulse Gen Serial Number: 9086757

## 2021-01-29 LAB — PARATHYROID HORMONE, INTACT (NO CA): PTH: 83 pg/mL — ABNORMAL HIGH (ref 16–77)

## 2021-01-29 LAB — CALCIUM, IONIZED: Calcium, Ion: 5.89 mg/dL — ABNORMAL HIGH (ref 4.8–5.6)

## 2021-02-06 NOTE — Progress Notes (Signed)
Remote pacemaker transmission.   

## 2021-02-17 DIAGNOSIS — M1612 Unilateral primary osteoarthritis, left hip: Secondary | ICD-10-CM | POA: Diagnosis not present

## 2021-02-18 DIAGNOSIS — M25852 Other specified joint disorders, left hip: Secondary | ICD-10-CM | POA: Diagnosis not present

## 2021-02-18 DIAGNOSIS — Z471 Aftercare following joint replacement surgery: Secondary | ICD-10-CM | POA: Diagnosis not present

## 2021-02-18 DIAGNOSIS — E0590 Thyrotoxicosis, unspecified without thyrotoxic crisis or storm: Secondary | ICD-10-CM | POA: Diagnosis not present

## 2021-02-18 DIAGNOSIS — M1990 Unspecified osteoarthritis, unspecified site: Secondary | ICD-10-CM | POA: Diagnosis not present

## 2021-02-18 DIAGNOSIS — M1612 Unilateral primary osteoarthritis, left hip: Secondary | ICD-10-CM | POA: Diagnosis not present

## 2021-02-18 DIAGNOSIS — M25752 Osteophyte, left hip: Secondary | ICD-10-CM | POA: Diagnosis not present

## 2021-02-18 DIAGNOSIS — Z950 Presence of cardiac pacemaker: Secondary | ICD-10-CM | POA: Diagnosis not present

## 2021-02-18 DIAGNOSIS — I10 Essential (primary) hypertension: Secondary | ICD-10-CM | POA: Diagnosis not present

## 2021-02-18 DIAGNOSIS — M169 Osteoarthritis of hip, unspecified: Secondary | ICD-10-CM | POA: Diagnosis not present

## 2021-02-18 DIAGNOSIS — Z952 Presence of prosthetic heart valve: Secondary | ICD-10-CM | POA: Diagnosis not present

## 2021-02-18 DIAGNOSIS — Z96642 Presence of left artificial hip joint: Secondary | ICD-10-CM | POA: Diagnosis not present

## 2021-02-18 HISTORY — PX: HIP ARTHROPLASTY: SHX981

## 2021-02-19 DIAGNOSIS — E0590 Thyrotoxicosis, unspecified without thyrotoxic crisis or storm: Secondary | ICD-10-CM | POA: Diagnosis not present

## 2021-02-19 DIAGNOSIS — Z950 Presence of cardiac pacemaker: Secondary | ICD-10-CM | POA: Diagnosis not present

## 2021-02-19 DIAGNOSIS — Z952 Presence of prosthetic heart valve: Secondary | ICD-10-CM | POA: Diagnosis not present

## 2021-02-19 DIAGNOSIS — Z96642 Presence of left artificial hip joint: Secondary | ICD-10-CM | POA: Diagnosis not present

## 2021-02-19 DIAGNOSIS — M1612 Unilateral primary osteoarthritis, left hip: Secondary | ICD-10-CM | POA: Diagnosis not present

## 2021-02-19 DIAGNOSIS — I10 Essential (primary) hypertension: Secondary | ICD-10-CM | POA: Diagnosis not present

## 2021-02-19 DIAGNOSIS — M25852 Other specified joint disorders, left hip: Secondary | ICD-10-CM | POA: Diagnosis not present

## 2021-03-17 ENCOUNTER — Encounter: Payer: Self-pay | Admitting: Sports Medicine

## 2021-03-17 ENCOUNTER — Encounter: Payer: Self-pay | Admitting: Internal Medicine

## 2021-03-31 DIAGNOSIS — M1612 Unilateral primary osteoarthritis, left hip: Secondary | ICD-10-CM | POA: Diagnosis not present

## 2021-04-30 ENCOUNTER — Ambulatory Visit: Payer: 59

## 2021-05-06 LAB — CUP PACEART REMOTE DEVICE CHECK
Battery Remaining Longevity: 88 mo
Battery Remaining Percentage: 71 %
Battery Voltage: 3.01 V
Brady Statistic AP VP Percent: 19 %
Brady Statistic AP VS Percent: 1 %
Brady Statistic AS VP Percent: 80 %
Brady Statistic AS VS Percent: 1 %
Brady Statistic RA Percent Paced: 18 %
Brady Statistic RV Percent Paced: 99 %
Date Time Interrogation Session: 20230215095917
Implantable Lead Implant Date: 20191112
Implantable Lead Implant Date: 20191112
Implantable Lead Location: 753859
Implantable Lead Location: 753860
Implantable Pulse Generator Implant Date: 20191112
Lead Channel Impedance Value: 380 Ohm
Lead Channel Impedance Value: 540 Ohm
Lead Channel Pacing Threshold Amplitude: 0.5 V
Lead Channel Pacing Threshold Amplitude: 0.5 V
Lead Channel Pacing Threshold Pulse Width: 0.5 ms
Lead Channel Pacing Threshold Pulse Width: 0.5 ms
Lead Channel Sensing Intrinsic Amplitude: 11.2 mV
Lead Channel Sensing Intrinsic Amplitude: 4.2 mV
Lead Channel Setting Pacing Amplitude: 0.75 V
Lead Channel Setting Pacing Amplitude: 1.5 V
Lead Channel Setting Pacing Pulse Width: 0.5 ms
Lead Channel Setting Sensing Sensitivity: 3.5 mV
Pulse Gen Model: 2272
Pulse Gen Serial Number: 9086757

## 2021-05-18 ENCOUNTER — Other Ambulatory Visit: Payer: Self-pay

## 2021-05-18 ENCOUNTER — Encounter: Payer: Self-pay | Admitting: Cardiovascular Disease

## 2021-05-18 ENCOUNTER — Ambulatory Visit: Payer: 59 | Admitting: Cardiovascular Disease

## 2021-05-18 VITALS — BP 136/92 | HR 83 | Ht 71.0 in | Wt 199.6 lb

## 2021-05-18 DIAGNOSIS — I442 Atrioventricular block, complete: Secondary | ICD-10-CM | POA: Diagnosis not present

## 2021-05-18 DIAGNOSIS — Z954 Presence of other heart-valve replacement: Secondary | ICD-10-CM

## 2021-05-18 DIAGNOSIS — I10 Essential (primary) hypertension: Secondary | ICD-10-CM

## 2021-05-18 NOTE — Progress Notes (Signed)
Cardiology Office Note:    Date:  05/18/2021   ID:  [name], DOB [birth year], MRN [id number]  PCP:  [name], MD   [facility] Providers Cardiologist:  [name], MD     Referring MD: [name],*   Chief Complaint  Patient presents with   Aortic Valve Disease    History of Present Illness:    Patrick Walter is a 66 y.o. male with a complicated cardiac history, presenting for follow-up evaluation.  The patient underwent bioprosthetic aortic valve replacement in 2015 without immediate complication.  He required redo aortic valve replacement for prosthetic valve endocarditis in 2019 with postoperative course complicated by complete heart block requiring permanent pacemaker placement.  He presents today for follow-up evaluation.  The patient is here alone today.  He is doing remarkably well.  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  He feels very well and denies any chest pain, chest pressure, shortness of breath, heart palpitations, lightheadedness, orthopnea, or PND.  He is compliant with his medications.  He takes SBE prophylaxis as indicated.  Past Medical History:  Diagnosis Date   Aortic stenosis    Ascending aorta enlargement (HCC)    Bicuspid aortic valve    Blood transfusion without reported diagnosis    Heart murmur    Hypertension    Patent foramen ovale    Closed at the time of aortic valve replacement   Pilonidal cyst    S/P aortic valve replacement with bioprosthetic valve 12/05/2013   25 mm [facility] Ease bovine pericardial tissue valve    S/P redo aortic valve replacement with human allograft aortic root graft 01/26/2018   Septic embolism (HCC)    Brain, spleen, superior mesenteric artery    Past Surgical History:  Procedure Laterality Date   AORTIC VALVE REPLACEMENT N/A 12/05/2013   Procedure: AORTIC VALVE REPLACEMENT (AVR);  Surgeon: [name], MD;  Location: [place];   Service: Open Heart Surgery;  Laterality: N/A;   AORTIC VALVE REPLACEMENT N/A 01/26/2018   Procedure: REDO STERNOTOMY, REDO AORTIC VALVE REPLACEMENT WITH HUMAN ALLOGRAFT INCLUDING AORTIC ROOT REPLACEMENT,  REPAIR ASCENDING THORACIC AORTIC ANEURYSM;  Surgeon: [name], MD;  Location: [place];  Service: Open Heart Surgery;  Laterality: N/A;   HAND SURGERY Left 07/1977   INTRAOPERATIVE TRANSESOPHAGEAL ECHOCARDIOGRAM N/A 12/05/2013   Procedure: INTRAOPERATIVE TRANSESOPHAGEAL ECHOCARDIOGRAM;  Surgeon: [name], MD;  Location: [place];  Service: Open Heart Surgery;  Laterality: N/A;   LEFT AND RIGHT HEART CATHETERIZATION WITH CORONARY ANGIOGRAM N/A 11/08/2013   Procedure: LEFT AND RIGHT HEART CATHETERIZATION WITH CORONARY ANGIOGRAM;  Surgeon: Blane Ohara, MD;  Location: [facility] CATH LAB;  Service: Cardiovascular;  Laterality: N/A;   PACEMAKER IMPLANT N/A 01/31/2018   SJM Assurity MRI DR implanted by Dr Rayann Heman for complete heart block   PATENT FORAMEN OVALE CLOSURE N/A 12/05/2013   Procedure: PATENT FORAMEN OVALE CLOSURE;  Surgeon: [name], MD;  Location: [place];  Service: Open Heart Surgery;  Laterality: N/A;   PILONIDAL CYST / SINUS EXCISION  01/20/2018   Dr Brantley Stage   TEE WITHOUT CARDIOVERSION N/A 10/23/2013   Procedure: TRANSESOPHAGEAL ECHOCARDIOGRAM (TEE);  Surgeon: Larey Dresser, MD;  Location: [place];  Service: Cardiovascular;  Laterality: N/A;   TEE WITHOUT CARDIOVERSION N/A 01/19/2018   Procedure: TRANSESOPHAGEAL ECHOCARDIOGRAM (TEE);  Surgeon: Sanda Klein, MD;  Location: [place];  Service: Cardiovascular;  Laterality: N/A;   TEE WITHOUT CARDIOVERSION N/A 01/26/2018  Procedure: TRANSESOPHAGEAL ECHOCARDIOGRAM (TEE);  Surgeon: [name], MD;  Location: [place];  Service: Open Heart Surgery;  Laterality: N/A;   VASECTOMY  06/2002    Current Medications: Current Meds  Medication Sig   alendronate (FOSAMAX) 70 MG tablet alendronate 70 mg tablet  TAKE 1 TABLET  EVERY WEEK BY ORAL ROUTE AS DIRECTED   allopurinol (ZYLOPRIM) 300 MG tablet Take 1 tablet (300 mg total) by mouth daily. (Patient taking differently: Take 300 mg by mouth as needed.)   amoxicillin (AMOXIL) 500 MG capsule SMARTSIG:4 Capsule(s) By Mouth   Ascorbic Acid (VITAMIN C) 1000 MG tablet Take 1,000 mg by mouth daily.   aspirin EC 81 MG tablet Take 81 mg by mouth daily.   bisacodyl (DULCOLAX) 5 MG EC tablet Take 10 mg by mouth daily as needed for moderate constipation (take with iron).   Boswellia-Glucosamine-Vit D (GLUCOSAMINE COMPLEX PO) Take 1 capsule by mouth daily.   celecoxib (CELEBREX) 200 MG capsule TAKE 1-2 CAPSULES (200-400 MG TOTAL) BY MOUTH DAILY AS NEEDED FOR MILD PAIN.   Coenzyme Q10 300 MG CAPS Take 1 capsule by mouth daily.   COLLAGEN PO Take by mouth.   Omega 3 1000 MG CAPS Take 3 capsules by mouth daily.   Probiotic Product (PROBIOTIC PO) Take 1 capsule by mouth daily.   TURMERIC PO Take 1 capsule by mouth daily.     Allergies:   Patient has no known allergies.   Social History   Socioeconomic History   Marital status: Married    Spouse name: Not on file   Number of children: 5   Years of education: Not on file   Highest education level: Not on file  Occupational History   Occupation: Art gallery manager    Employer: [employer]  Tobacco Use   Smoking status: Never   Smokeless tobacco: Never  Vaping Use   Vaping Use: Never used  Substance and Sexual Activity   Alcohol use: Yes    Alcohol/week: 0.0 standard drinks    Comment: occ   Drug use: No   Sexual activity: Not on file  Other Topics Concern   Not on file  Social History Narrative   The patient is married. He is originally from [place]. He works as an Art gallery manager. He does not smoke cigarettes or drink alcohol.   Social Determinants of Health   Financial Resource Strain: Not on file  Food Insecurity: Not on file  Transportation Needs: Not on file  Physical Activity: Not on file   Stress: Not on file  Social Connections: Not on file     Family History: The patient's family history includes Cancer in his daughter, father, maternal grandfather, maternal grandmother, mother, paternal grandfather, and paternal grandmother; Colon cancer (age of onset: 56) in his paternal grandmother; Colon cancer (age of onset: 22) in his father; Colon polyps in his father and paternal grandmother; Diabetes in his father, maternal grandfather, maternal grandmother, mother, paternal grandfather, and paternal grandmother; Heart attack in his maternal grandmother; Hypertension in his maternal grandmother; Stroke in his paternal grandfather. There is no history of Esophageal cancer or Stomach cancer.  ROS:   Please see the history of present illness.    All other systems reviewed and are negative.  EKGs/Labs/Other Studies Reviewed:    The following studies were reviewed today: Echo 2020: 1. The left ventricle has low normal systolic function, with an ejection  fraction of 50-55%. The cavity size was mildly dilated. Left ventricular  diastolic parameters  were normal There is abnormal septal motion  consistent with RV pacemaker.   2. The right ventricle has mildly reduced systolic function. The cavity  was mildly enlarged. There is no increase in right ventricular wall  thickness. Right ventricular systolic pressure is normal with an estimated  pressure of 25.8 mmHg.   3. Left atrial size was mildly dilated.   4. Right atrial size was mildly dilated.   5. The mitral valve is normal in structure. Mild thickening of the mitral  valve leaflet. There is mild mitral annular calcification present. Mitral  valve regurgitation is mild to moderate by color flow Doppler. The MR jet  is eccentric medially directed.   6. The tricuspid valve is normal in structure.   7. A Human allograft valve is present in the aortic position (including  aortic root replacement, repair ascending thoracic aortic  aneurysm;  Procedure Date: 01/26/2018).   8. Normal aortic valve prosthesis. Peak and mean gradients are 12 and 7  mm Hg, respectively.   9. Aortic valve regurgitation is trivial by color flow Doppler.  10. The pulmonic valve was normal in structure.  11. The aortic root and ascending aorta are normal in size and structure.   EKG:  EKG is not ordered today.   Recent Labs: 01/06/2021: ALT 18; BUN 21; Creat 1.04; Hemoglobin 14.7; Platelets 238; Potassium 4.2; Sodium 142; TSH 2.33  Recent Lipid Panel    Component Value Date/Time   CHOL 200 (H) 01/06/2021 0000   TRIG 74 01/06/2021 0000   HDL 54 01/06/2021 0000   CHOLHDL 3.7 01/06/2021 0000   VLDL 13 04/21/2015 0912   LDLCALC 129 (H) 01/06/2021 0000     Risk Assessment/Calculations:           Physical Exam:    VS:  BP (!) 136/92    Pulse 83    Ht 5\' 11"  (1.803 m)    Wt 199 lb 9.6 oz (90.5 kg)    SpO2 97%    BMI 27.84 kg/m     Wt Readings from Last 3 Encounters:  05/18/21 199 lb 9.6 oz (90.5 kg)  01/28/21 198 lb (89.8 kg)  01/08/21 198 lb (89.8 kg)     GEN:  Well nourished, well developed in no acute distress HEENT: Normal NECK: No JVD; No carotid bruits LYMPHATICS: No lymphadenopathy CARDIAC: RRR, 2/6 systolic ejection murmur at the right upper sternal border RESPIRATORY:  Clear to auscultation without rales, wheezing or rhonchi  ABDOMEN: Soft, non-tender, non-distended MUSCULOSKELETAL:  No edema; No deformity  SKIN: Warm and dry NEUROLOGIC:  Alert and oriented x 3 PSYCHIATRIC:  Normal affect   ASSESSMENT:    1. S/P aortic valve replacement with allograft   2. AV block, 3rd degree (HCC)   3. Primary hypertension    PLAN:    In order of problems listed above:  Appears to be doing very well.  His exam is normal and he has excellent functional capacity.  I reviewed his most recent echo from 2020 as outlined above.  Emphasized the importance of SBE prophylaxis.  The patient is compliant with this.  He remains on  aspirin 81 mg daily. Patient status post permanent pacemaker placement, followed in the electrophysiology clinic.  He has some concerns about his upper rate limit set at 155 bpm because he likes to exercise vigorously.  He will continue to test the limits of this and discussed with the EP team whether any changes need to be made. Reports home blood pressures  in the 120-130s/70-90 mmHg.  States that he is under a lot of stress today but feels like his blood pressure control is good.  He will continue to follow this.    Medication Adjustments/Labs and Tests Ordered: Current medicines are reviewed at length with the patient today.  Concerns regarding medicines are outlined above.  No orders of the defined types were placed in this encounter.  No orders of the defined types were placed in this encounter.   Patient Instructions  Medication Instructions:  NONE *If you need a refill on your cardiac medications before your next appointment, please call your pharmacy*   Lab Work: NONE If you have labs (blood work) drawn today and your tests are completely normal, you will receive your results only by: [place] (if you have MyChart) OR A paper copy in the mail If you have any lab test that is abnormal or we need to change your treatment, we will call you to review the results.   Testing/Procedures: NONE   Follow-Up: At [facility], you and your health needs are our priority.  As part of our continuing mission to provide you with exceptional heart care, we have created designated Provider Care Teams.  These Care Teams include your primary Cardiologist (physician) and Advanced Practice Providers (APPs -  Physician Assistants and Nurse Practitioners) who all work together to provide you with the care you need, when you need it.  Your next appointment:   1 year(s)  The format for your next appointment:   In Person  Provider:   [name], MD        Signed, [name],  MD  05/18/2021 6:02 PM    [place]

## 2021-05-18 NOTE — Patient Instructions (Signed)
Medication Instructions:  NONE *If you need a refill on your cardiac medications before your next appointment, please call your pharmacy*   Lab Work: NONE If you have labs (blood work) drawn today and your tests are completely normal, you will receive your results only by: [place] (if you have MyChart) OR A paper copy in the mail If you have any lab test that is abnormal or we need to change your treatment, we will call you to review the results.   Testing/Procedures: NONE   Follow-Up: At [facility], you and your health needs are our priority.  As part of our continuing mission to provide you with exceptional heart care, we have created designated Provider Care Teams.  These Care Teams include your primary Cardiologist (physician) and Advanced Practice Providers (APPs -  Physician Assistants and Nurse Practitioners) who all work together to provide you with the care you need, when you need it.  Your next appointment:   1 year(s)  The format for your next appointment:   In Person  Provider:   [name], MD

## 2021-07-20 ENCOUNTER — Ambulatory Visit: Payer: 59 | Admitting: Cardiovascular Disease

## 2021-07-30 ENCOUNTER — Ambulatory Visit: Payer: 59 | Admitting: Internal Medicine

## 2021-07-30 ENCOUNTER — Ambulatory Visit (INDEPENDENT_AMBULATORY_CARE_PROVIDER_SITE_OTHER): Payer: 59

## 2021-07-30 ENCOUNTER — Encounter: Payer: Self-pay | Admitting: Internal Medicine

## 2021-07-30 VITALS — BP 130/74 | HR 79 | Ht 71.0 in | Wt 200.3 lb

## 2021-07-30 DIAGNOSIS — I442 Atrioventricular block, complete: Secondary | ICD-10-CM | POA: Diagnosis not present

## 2021-07-30 DIAGNOSIS — E210 Primary hyperparathyroidism: Secondary | ICD-10-CM

## 2021-07-30 LAB — CUP PACEART REMOTE DEVICE CHECK
Battery Remaining Longevity: 85 mo
Battery Remaining Percentage: 69 %
Battery Voltage: 2.99 V
Brady Statistic AP VP Percent: 24 %
Brady Statistic AP VS Percent: 1 %
Brady Statistic AS VP Percent: 76 %
Brady Statistic AS VS Percent: 1 %
Brady Statistic RA Percent Paced: 23 %
Brady Statistic RV Percent Paced: 99 %
Date Time Interrogation Session: 20230511020026
Implantable Lead Implant Date: 20191112
Implantable Lead Implant Date: 20191112
Implantable Lead Location: 753859
Implantable Lead Location: 753860
Implantable Pulse Generator Implant Date: 20191112
Lead Channel Impedance Value: 390 Ohm
Lead Channel Impedance Value: 540 Ohm
Lead Channel Pacing Threshold Amplitude: 0.375 V
Lead Channel Pacing Threshold Amplitude: 0.625 V
Lead Channel Pacing Threshold Pulse Width: 0.5 ms
Lead Channel Pacing Threshold Pulse Width: 0.5 ms
Lead Channel Sensing Intrinsic Amplitude: 3.6 mV
Lead Channel Sensing Intrinsic Amplitude: 7.9 mV
Lead Channel Setting Pacing Amplitude: 0.875
Lead Channel Setting Pacing Amplitude: 1.375
Lead Channel Setting Pacing Pulse Width: 0.5 ms
Lead Channel Setting Sensing Sensitivity: 3.5 mV
Pulse Gen Model: 2272
Pulse Gen Serial Number: 9086757

## 2021-07-30 LAB — BASIC METABOLIC PANEL
BUN: 23 mg/dL (ref 6–23)
CO2: 25 mEq/L (ref 19–32)
Calcium: 10.2 mg/dL (ref 8.4–10.5)
Chloride: 110 mEq/L (ref 96–112)
Creatinine, Ser: 0.95 mg/dL (ref 0.40–1.50)
GFR: 84.99 mL/min (ref >60.00)
Glucose, Bld: 92 mg/dL (ref 70–99)
Potassium: 4.2 mEq/L (ref 3.5–5.1)
Sodium: 141 mEq/L (ref 135–145)

## 2021-07-30 LAB — ALBUMIN: Albumin: 4.4 g/dL (ref 3.5–5.2)

## 2021-07-30 LAB — VITAMIN D 25 HYDROXY (VIT D DEFICIENCY, FRACTURES): VITD: 32.86 ng/mL (ref 30.00–100.00)

## 2021-07-30 NOTE — Progress Notes (Signed)
? ?Name: Patrick Walter  ?MRN/ DOB: [id number], 1955-12-12    ?Age/ Sex: 66 y.o., male   ? ? ?PCP: [name], MD   ?Reason for Endocrinology Evaluation: Hypercalcemia   ?   ?Initial Endocrinology Clinic Visit: 03/26/2020  ? ? ?PATIENT IDENTIFIER: Patrick Walter is a 66 y.o., male with a past medical history of history of Aortic valve replacement (2015). He has followed with [facility] since 03/26/2020 for consultative assistance with management of his hypercalcemia   ? ?HISTORICAL SUMMARY: The patient was first diagnosed with hypercalcemia in 2019. With a max serum calcium of 11.4 mg/dL and a max PTH of 103 pg/mL in 09/2019.  ? ?DXA showed low bone density 04/04/2020 ? ?24 hr urinary excretion of calcium was 360 mg ( 03/2020)  repeat decreased to 256 mg by 07/2020 ?KUB showed renal stone 7 mm  ? ?SUBJECTIVE:  ? ? ? ?Today (07/30/2021):  [name] is here for a follow up on Primary hyperparathyroidism.  ? ?He is S/P  left hip resurfacing in 01/2021, he is doing exercise  ?He has been started on Alendronate through ortho  ? ?Denies polyuria ,or frequency ?No renal stones  ? ?Consumes at least 2 servings of calcium daily  ?Vitamin D 5000  iu daily  ? ?HISTORY:  ?Past Medical History:  ?Past Medical History:  ?Diagnosis Date  ? Aortic stenosis   ? Ascending aorta enlargement (HCC)   ? Bicuspid aortic valve   ? Blood transfusion without reported diagnosis   ? Heart murmur   ? Hypertension   ? Patent foramen ovale   ? Closed at the time of aortic valve replacement  ? Pilonidal cyst   ? S/P aortic valve replacement with bioprosthetic valve 12/05/2013  ? 25 mm [facility] Ease bovine pericardial tissue valve   ? S/P redo aortic valve replacement with human allograft aortic root graft 01/26/2018  ? Septic embolism ([place])   ? Brain, spleen, superior mesenteric artery  ? ?Past Surgical History:  ?Past Surgical History:  ?Procedure Laterality Date  ? AORTIC VALVE REPLACEMENT N/A 12/05/2013  ?  Procedure: AORTIC VALVE REPLACEMENT (AVR);  Surgeon: [name], MD;  Location: [place];  Service: Open Heart Surgery;  Laterality: N/A;  ? AORTIC VALVE REPLACEMENT N/A 01/26/2018  ? Procedure: REDO STERNOTOMY, REDO AORTIC VALVE REPLACEMENT WITH HUMAN ALLOGRAFT INCLUDING AORTIC ROOT REPLACEMENT,  REPAIR ASCENDING THORACIC AORTIC ANEURYSM;  Surgeon: [name], MD;  Location: [place];  Service: Open Heart Surgery;  Laterality: N/A;  ? HAND SURGERY Left 07/1977  ? INTRAOPERATIVE TRANSESOPHAGEAL ECHOCARDIOGRAM N/A 12/05/2013  ? Procedure: INTRAOPERATIVE TRANSESOPHAGEAL ECHOCARDIOGRAM;  Surgeon: [name], MD;  Location: [place];  Service: Open Heart Surgery;  Laterality: N/A;  ? LEFT AND RIGHT HEART CATHETERIZATION WITH CORONARY ANGIOGRAM N/A 11/08/2013  ? Procedure: LEFT AND RIGHT HEART CATHETERIZATION WITH CORONARY ANGIOGRAM;  Surgeon: Blane Ohara, MD;  Location: [facility] CATH LAB;  Service: Cardiovascular;  Laterality: N/A;  ? PACEMAKER IMPLANT N/A 01/31/2018  ? SJM Assurity MRI DR implanted by Dr Rayann Heman for complete heart block  ? PATENT FORAMEN OVALE CLOSURE N/A 12/05/2013  ? Procedure: PATENT FORAMEN OVALE CLOSURE;  Surgeon: [name], MD;  Location: [place];  Service: Open Heart Surgery;  Laterality: N/A;  ? PILONIDAL CYST / SINUS EXCISION  01/20/2018  ? Dr Brantley Stage  ? TEE WITHOUT CARDIOVERSION N/A 10/23/2013  ? Procedure: TRANSESOPHAGEAL ECHOCARDIOGRAM (TEE);  Surgeon: Larey Dresser, MD;  Location: [place];  Service: Cardiovascular;  Laterality: N/A;  ? TEE WITHOUT CARDIOVERSION N/A 01/19/2018  ? Procedure: TRANSESOPHAGEAL ECHOCARDIOGRAM (TEE);  Surgeon: Sanda Klein, MD;  Location: [place];  Service: Cardiovascular;  Laterality: N/A;  ? TEE WITHOUT CARDIOVERSION N/A 01/26/2018  ? Procedure: TRANSESOPHAGEAL ECHOCARDIOGRAM (TEE);  Surgeon: [name], MD;  Location: [place];  Service: Open Heart Surgery;  Laterality: N/A;  ? VASECTOMY  06/2002  ? ?Social History:  reports that he has  never smoked. He has never used smokeless tobacco. He reports current alcohol use. He reports that he does not use drugs. ?Family History:  ?Family History  ?Problem Relation Age of Onset  ? Diabetes Mother   ? Cancer Mother   ?     colon  ? Diabetes Father   ? Cancer Father   ?     colon  ? Colon cancer Father 66  ? Colon polyps Father   ? Diabetes Paternal Grandmother   ? Cancer Paternal Grandmother   ? Colon cancer Paternal Grandmother 107  ? Colon polyps Paternal Grandmother   ? Cancer Daughter   ?     leukemia  ? Heart attack Maternal Grandmother   ? Hypertension Maternal Grandmother   ? Diabetes Maternal Grandmother   ? Cancer Maternal Grandmother   ? Diabetes Maternal Grandfather   ? Cancer Maternal Grandfather   ? Stroke Paternal Grandfather   ? Diabetes Paternal Grandfather   ? Cancer Paternal Grandfather   ? Esophageal cancer Neg Hx   ? Stomach cancer Neg Hx   ? ? ? ?HOME MEDICATIONS: ?Allergies as of 07/30/2021   ?No Known Allergies ?  ? ?  ?Medication List  ?  ? ?  ? Accurate as of Jul 30, 2021 11:09 AM. If you have any questions, ask your nurse or doctor.  ?  ?  ? ?  ? ?STOP taking these medications   ? ?multivitamin with minerals Tabs tablet ?Stopped by: Dorita Sciara, MD ?  ? ?  ? ?TAKE these medications   ? ?alendronate 70 MG tablet ?Commonly known as: FOSAMAX ?alendronate 70 mg tablet ? TAKE 1 TABLET EVERY WEEK BY ORAL ROUTE AS DIRECTED ?  ?allopurinol 300 MG tablet ?Commonly known as: ZYLOPRIM ?Take 1 tablet (300 mg total) by mouth daily. ?What changed:  ?when to take this ?reasons to take this ?  ?amoxicillin 500 MG capsule ?Commonly known as: AMOXIL ?SMARTSIG:4 Capsule(s) By Mouth ?  ?aspirin EC 81 MG tablet ?Take 81 mg by mouth daily. ?  ?bisacodyl 5 MG EC tablet ?Commonly known as: DULCOLAX ?Take 10 mg by mouth daily as needed for moderate constipation (take with iron). ?  ?celecoxib 200 MG capsule ?Commonly known as: CELEBREX ?TAKE 1-2 CAPSULES (200-400 MG TOTAL) BY MOUTH DAILY AS NEEDED  FOR MILD PAIN. ?  ?Coenzyme Q10 300 MG Caps ?Take 1 capsule by mouth daily. ?  ?COLLAGEN PO ?Take by mouth. ?  ?GLUCOSAMINE COMPLEX PO ?Take 1 capsule by mouth daily. ?  ?Omega 3 1000 MG Caps ?Take 3 capsules by mouth daily. ?  ?PROBIOTIC PO ?Take 1 capsule by mouth daily. ?  ?TURMERIC PO ?Take 1 capsule by mouth daily. ?  ?vitamin C 1000 MG tablet ?Take 1,000 mg by mouth daily. ?  ? ?  ? ? ? ? ?OBJECTIVE:  ? ?PHYSICAL EXAM: ?VS: BP 130/74 (BP Location: Left Arm, Patient Position: Sitting, Cuff Size: Large)   Pulse 79   Ht '5\' 11"'$  (1.803 m)   Wt 200 lb  4.8 oz (90.9 kg)   SpO2 99%   BMI 27.94 kg/m?   ? ?EXAM: ?General: Pt appears well and is in NAD  ?Neck: General: Supple without adenopathy. ?Thyroid: Thyroid size normal.  No goiter or nodules appreciated.   ?Lungs: Clear with good BS bilat with no rales, rhonchi, or wheezes  ?Heart: Auscultation: RRR.  ?Abdomen: Normoactive bowel sounds, soft, nontender, without masses or organomegaly palpable  ?Extremities:  ?BL LE: No pretibial edema normal ROM and strength.  ?Mental Status: Judgment, insight: Intact ?Orientation: Oriented to time, place, and person ?Mood and affect: No depression, anxiety, or agitation  ? ? ? ?DATA REVIEWED: ? Latest Reference Range & Units 07/30/21 11:23  ?Sodium 135 - 145 mEq/L 141  ?Potassium 3.5 - 5.1 mEq/L 4.2  ?Chloride 96 - 112 mEq/L 110  ?CO2 19 - 32 mEq/L 25  ?Glucose 70 - 99 mg/dL 92  ?BUN 6 - 23 mg/dL 23  ?Creatinine 0.40 - 1.50 mg/dL 0.95  ?Calcium 8.4 - 10.5 mg/dL 10.2  ?Calcium Ionized 4.7 - 5.5 mg/dL 5.5  ?Albumin 3.5 - 5.2 g/dL 4.4  ?GFR >60.00 mL/min 84.99  ? ? Latest Reference Range & Units 07/30/21 11:23  ?VITD 30.00 - 100.00 ng/mL 32.86  ? ? Latest Reference Range & Units 07/30/21 11:23  ?PTH, Intact 16 - 77 pg/mL 102 (H)  ? ? ?Results for REVIN, CORKER (MRN [id number]) as of 01/28/2021 07:58 ? Ref. Range 03/31/2020 11:04 08/04/2020 12:22  ?Calcium, 24H Urine Latest Units: mg/24 h 360 (H) 256  ? ? ?KUB 07/24/2020 ?Both  kidneys are largely obscured by bowel contents. There is a 7 mm ?stone projected over the lower pole the left kidney. No renal or ?ureteral stones. ?  ?IMPRESSION: ?A 7 mm calcification on the left is likely a stone in t

## 2021-07-30 NOTE — Patient Instructions (Signed)
-   Please stay hydrated ?- Avoid over the counter calcium tablets ?- Consume 2-3 servings of calcium in your diet  ? ? ?

## 2021-07-31 LAB — PARATHYROID HORMONE, INTACT (NO CA): PTH: 102 pg/mL — ABNORMAL HIGH (ref 16–77)

## 2021-07-31 LAB — CALCIUM, IONIZED: Calcium, Ion: 5.5 mg/dL (ref 4.7–5.5)

## 2021-08-06 NOTE — Progress Notes (Signed)
Remote pacemaker transmission.   

## 2021-09-24 DIAGNOSIS — E210 Primary hyperparathyroidism: Secondary | ICD-10-CM | POA: Diagnosis not present

## 2021-09-24 DIAGNOSIS — E213 Hyperparathyroidism, unspecified: Secondary | ICD-10-CM | POA: Diagnosis not present

## 2021-09-29 ENCOUNTER — Other Ambulatory Visit (HOSPITAL_COMMUNITY): Payer: Self-pay | Admitting: Surgery

## 2021-09-29 ENCOUNTER — Other Ambulatory Visit: Payer: Self-pay | Admitting: Surgery

## 2021-09-29 DIAGNOSIS — E210 Primary hyperparathyroidism: Secondary | ICD-10-CM

## 2021-10-08 ENCOUNTER — Encounter (HOSPITAL_COMMUNITY)
Admission: RE | Admit: 2021-10-08 | Discharge: 2021-10-08 | Disposition: A | Discharge status: Home / Self Care | Payer: 59 | Source: Ambulatory Visit | Attending: Surgery | Admitting: Surgery

## 2021-10-08 ENCOUNTER — Ambulatory Visit (HOSPITAL_COMMUNITY)
Admission: RE | Admit: 2021-10-08 | Discharge: 2021-10-08 | Disposition: A | Discharge status: Home / Self Care | Payer: 59 | Source: Ambulatory Visit | Attending: Surgery | Admitting: Surgery

## 2021-10-08 ENCOUNTER — Ambulatory Visit: Payer: Self-pay | Admitting: Surgery

## 2021-10-08 DIAGNOSIS — E210 Primary hyperparathyroidism: Secondary | ICD-10-CM | POA: Insufficient documentation

## 2021-10-08 DIAGNOSIS — E041 Nontoxic single thyroid nodule: Secondary | ICD-10-CM | POA: Diagnosis not present

## 2021-10-08 DIAGNOSIS — E213 Hyperparathyroidism, unspecified: Secondary | ICD-10-CM | POA: Diagnosis not present

## 2021-10-08 MED ORDER — TECHNETIUM TC 99M SESTAMIBI GENERIC - CARDIOLITE
25.5000 | Freq: Once | INTRAVENOUS | Status: AC | PRN
  Administered 2021-10-08: 25.5 via INTRAVENOUS

## 2021-10-08 NOTE — Progress Notes (Signed)
USN and sestamibi scan both localize a parathyroid adenoma on the left side.  Will plan to proceed with minimally invasive parathyroidectomy as an outpatient procedure as discussed.  Will enter orders and send to schedulers to contact the patient.  tmg  Armandina Gemma, [facility] [place] practice Office: [phone]

## 2021-10-19 ENCOUNTER — Encounter: Payer: Self-pay | Admitting: Cardiovascular Disease

## 2021-10-27 NOTE — Patient Instructions (Signed)
DUE TO COVID-19 ONLY TWO VISITORS  (aged 64 and older)  ARE ALLOWED TO COME WITH YOU AND STAY IN THE WAITING ROOM ONLY DURING PRE OP AND PROCEDURE.   **NO VISITORS ARE ALLOWED IN THE SHORT STAY AREA OR RECOVERY ROOM!!**  IF YOU WILL BE ADMITTED INTO THE HOSPITAL YOU ARE ALLOWED ONLY FOUR SUPPORT PEOPLE DURING VISITATION HOURS ONLY (7 AM -8PM)   The support person(s) must pass our screening, gel in and out, and wear a mask at all times, including in the patient's room. Patients must also wear a mask when staff or their support person are in the room. Visitors GUEST BADGE MUST BE WORN VISIBLY  One adult visitor may remain with you overnight and MUST be in the room by 8 P.M.     Your procedure is scheduled on: 11/06/21   Report to [facility] Main Entrance    Report to admitting at : 5:30 AM   Call this number if you have problems the morning of surgery [phone]   Do not eat food :After Midnight.   After Midnight you may have the following liquids until : 4:30 AM DAY OF SURGERY  Water Black Coffee (sugar ok, NO MILK/CREAM OR CREAMERS)  Tea (sugar ok, NO MILK/CREAM OR CREAMERS) regular and decaf                             Plain Jell-O (NO RED)                                           Fruit ices (not with fruit pulp, NO RED)                                     Popsicles (NO RED)                                                                  Juice: apple, WHITE grape, WHITE cranberry Sports drinks like Gatorade (NO RED)               Oral Hygiene is also important to reduce your risk of infection.                                    Remember - BRUSH YOUR TEETH THE MORNING OF SURGERY WITH YOUR REGULAR TOOTHPASTE   Do NOT smoke after Midnight   Take these medicines the morning of surgery with A SIP OF WATER: allopurinol as needed.  DO NOT TAKE ANY ORAL DIABETIC MEDICATIONS DAY OF YOUR SURGERY  Bring CPAP mask and tubing day of surgery.                              You  may not have any metal on your body including hair pins, jewelry, and body piercing             Do not wear lotions,  powders, perfumes/cologne, or deodorant              Men may shave face and neck.   Do not bring valuables to the hospital. [place].   Contacts, dentures or bridgework may not be worn into surgery.   Bring small overnight bag day of surgery.   DO NOT [place]. PHARMACY WILL DISPENSE MEDICATIONS LISTED ON YOUR MEDICATION LIST TO YOU DURING YOUR ADMISSION [place]!    Patients discharged on the day of surgery will not be allowed to drive home.  Someone NEEDS to stay with you for the first 24 hours after anesthesia.   Special Instructions: Bring a copy of your healthcare power of attorney and living will documents         the day of surgery if you haven't scanned them before.              Please read over the following fact sheets you were given: IF YOU HAVE QUESTIONS ABOUT YOUR PRE-OP INSTRUCTIONS PLEASE CALL [phone]     [facility] - Preparing for Surgery Before surgery, you can play an important role.  Because skin is not sterile, your skin needs to be as free of germs as possible.  You can reduce the number of germs on your skin by washing with CHG (chlorahexidine gluconate) soap before surgery.  CHG is an antiseptic cleaner which kills germs and bonds with the skin to continue killing germs even after washing. Please DO NOT use if you have an allergy to CHG or antibacterial soaps.  If your skin becomes reddened/irritated stop using the CHG and inform your nurse when you arrive at Short Stay. Do not shave (including legs and underarms) for at least 48 hours prior to the first CHG shower.  You may shave your face/neck. Please follow these instructions carefully:  1.  Shower with CHG Soap the night before surgery and the  morning of Surgery.  2.  If you choose to wash your hair,  wash your hair first as usual with your  normal  shampoo.  3.  After you shampoo, rinse your hair and body thoroughly to remove the  shampoo.                           4.  Use CHG as you would any other liquid soap.  You can apply chg directly  to the skin and wash                       Gently with a scrungie or clean washcloth.  5.  Apply the CHG Soap to your body ONLY FROM THE NECK DOWN.   Do not use on face/ open                           Wound or open sores. Avoid contact with eyes, ears mouth and genitals (private parts).                       Wash face,  Genitals (private parts) with your normal soap.             6.  Wash thoroughly, paying special attention to the area where your surgery  will be performed.  7.  Thoroughly rinse your body with warm water from the neck down.  8.  DO NOT shower/wash with your normal soap after using and rinsing off  the CHG Soap.                9.  Pat yourself dry with a clean towel.            10.  Wear clean pajamas.            11.  Place clean sheets on your bed the night of your first shower and do not  sleep with pets. Day of Surgery : Do not apply any lotions/deodorants the morning of surgery.  Please wear clean clothes to the hospital/surgery center.  FAILURE TO FOLLOW THESE INSTRUCTIONS MAY RESULT IN THE CANCELLATION OF YOUR SURGERY PATIENT SIGNATURE_________________________________  NURSE SIGNATURE__________________________________  ________________________________________________________________________

## 2021-10-28 ENCOUNTER — Encounter: Payer: Self-pay | Admitting: Internal Medicine

## 2021-10-28 ENCOUNTER — Encounter (HOSPITAL_COMMUNITY): Payer: Self-pay | Admitting: Surgery

## 2021-10-28 NOTE — H&P (Signed)
REFERRING PHYSICIAN: Shamleffer, Ibtehal  PROVIDER: Prateek Knipple Charlotta Newton, MD   Chief Complaint: New Consultation (Primary hyperparathyroidism)  History of Present Illness:  Patient is referred by Dr. Vivia Ewing for surgical evaluation and management of primary hyperparathyroidism. Patient had been noted by his primary care physician to have elevated serum calcium levels over 1 year ago. Patient was referred to endocrinology. Additional testing showed a calcium level of 10.2, and intact PTH level of 102, a 25-hydroxy vitamin D level of 32.86, and a 24-hour urine collection for calcium of 256. Patient has noted some mild fatigue. He is also had some bone and joint discomfort. He does have nephrolithiasis. Bone density scanning showed osteopenia. He has had no imaging studies. He has had no prior head or neck surgery. There is no family history of parathyroid disease.  Patient does have significant past surgical history with aortic valve replacement. He also has a pacemaker in place. He is followed by Dr. Gerrit Halls and Dr. Thompson Grayer.  Review of Systems: A complete review of systems was obtained from the patient. I have reviewed this information and discussed as appropriate with the patient. See HPI as well for other ROS.  Review of Systems  Constitutional: Positive for malaise/fatigue.  HENT: Negative.  Eyes: Negative.  Respiratory: Negative.  Cardiovascular: Negative.  Gastrointestinal: Negative.  Genitourinary:  Nephrolithiasis  Musculoskeletal: Positive for joint pain.  Osteopenia  Skin: Negative.  Neurological: Negative.  Endo/Heme/Allergies: Negative.  Psychiatric/Behavioral: Negative.   Medical History: History reviewed. No pertinent past medical history.  There is no problem list on file for this patient.  History reviewed. No pertinent surgical history.   No Known Allergies  Current Outpatient Medications on File Prior to Visit  Medication Sig  Dispense Refill  aspirin 81 MG EC tablet Take 81 mg by mouth once daily   No current facility-administered medications on file prior to visit.   History reviewed. No pertinent family history.   Social History   Tobacco Use  Smoking Status Not on file  Smokeless Tobacco Not on file    Social History   Socioeconomic History  Marital status: Married    Physical Exam   GENERAL APPEARANCE Comfortable, no acute issues Development: normal Gross deformities: none  SKIN Rash, lesions, ulcers: none Induration, erythema: none Nodules: none palpable  EYES Conjunctiva and lids: normal Pupils: equal and reactive  EARS, NOSE, MOUTH, THROAT External ears: no lesion or deformity External nose: no lesion or deformity Hearing: grossly normal  NECK Symmetric: yes Trachea: midline Thyroid: no palpable nodules in the thyroid bed  CHEST Respiratory effort: normal Retraction or accessory muscle use: no Breath sounds: normal bilaterally Rales, rhonchi, wheeze: none  CARDIOVASCULAR Auscultation: regular rhythm, normal rate Murmurs: none Pulses: radial pulse 2+ palpable Lower extremity edema: none  ABDOMEN Not assessed  GENITOURINARY Not assessed  MUSCULOSKELETAL Station and gait: normal Digits and nails: no clubbing or cyanosis Muscle strength: grossly normal all extremities Range of motion: grossly normal all extremities Deformity: none  LYMPHATIC Cervical: none palpable Supraclavicular: none palpable  PSYCHIATRIC Oriented to person, place, and time: yes Mood and affect: normal for situation Judgment and insight: appropriate for situation   Assessment and Plan:   Primary hyperparathyroidism (CMS-HCC)  Patient is referred by Dr. Vivia Ewing for surgical evaluation and recommendations regarding primary hyperparathyroidism.  Patient provided with a copy of "Parathyroid Surgery: Treatment for Your Parathyroid Gland Problem", published by [employer], 12 pages.  Book reviewed and explained to patient during visit  today.  Patient has biochemical evidence of primary hyperparathyroidism. He also has complications. I would like to proceed with imaging studies in hopes of identifying the location of the parathyroid adenoma and making him a good candidate for minimally invasive outpatient surgery. We will order an ultrasound study of the neck as well as a nuclear medicine parathyroid scan. If these localize an adenoma, then I believe he will be a good candidate for minimally invasive procedure. We discussed the procedure today. We discussed doing this as an outpatient surgery. We discussed the fact that over 95% of parathyroid adenomas or solitary glands. We discussed the postoperative recovery. If the ultrasound and sestamibi scan are unrevealing, then we will proceed with a 4D CT scan of the neck in hopes of identifying the adenoma preoperatively. The patient understands and agrees to proceed.  We will schedule the above diagnostic studies and contact the patient with the results when they are available.   Armandina Gemma, MD [facility] [place] practice Office: [phone]

## 2021-10-28 NOTE — Progress Notes (Signed)
PERIOPERATIVE PRESCRIPTION FOR IMPLANTED CARDIAC DEVICE PROGRAMMING  Patient Information: Name:  Patrick Walter  DOB:  1955/05/14  MRN:  [id number]    Planned Procedure:  parathyroidectomy  Surgeon:  Dr.Todd Harlow Asa  Date of Procedure:  11/06/21  Cautery will be used.  Position during surgery:     Please send documentation back to:  [facility]le (Fax # [phone])  Device Information:  Clinic EP Physician:  Thompson Grayer, MD   Device Type:  Pacemaker Manufacturer and Phone #:  [employer]: [phone] Pacemaker Dependent?:  Yes.   Date of Last Device Check:  07/30/21 Normal Device Function?:  Yes.    Electrophysiologist's Recommendations:  Have magnet available. Provide continuous ECG monitoring when magnet is used or reprogramming is to be performed.  Procedure will likely interfere with device function.  Device should be programmed:  Asynchronous pacing during procedure and returned to normal programming after procedure  Per Device Clinic Standing Orders, Simone Curia, RN  1:11 PM 10/28/2021

## 2021-10-29 ENCOUNTER — Other Ambulatory Visit: Payer: Self-pay

## 2021-10-29 ENCOUNTER — Encounter (HOSPITAL_COMMUNITY): Payer: Self-pay

## 2021-10-29 ENCOUNTER — Encounter (HOSPITAL_COMMUNITY)
Admission: RE | Admit: 2021-10-29 | Discharge: 2021-10-29 | Disposition: A | Discharge status: Home / Self Care | Payer: 59 | Source: Ambulatory Visit | Attending: Surgery | Admitting: Surgery

## 2021-10-29 ENCOUNTER — Ambulatory Visit: Payer: 59

## 2021-10-29 VITALS — BP 147/91 | HR 64 | Temp 98.0°F | Ht 71.0 in | Wt 203.0 lb

## 2021-10-29 DIAGNOSIS — Z953 Presence of xenogenic heart valve: Secondary | ICD-10-CM | POA: Insufficient documentation

## 2021-10-29 DIAGNOSIS — I10 Essential (primary) hypertension: Secondary | ICD-10-CM | POA: Insufficient documentation

## 2021-10-29 DIAGNOSIS — Z01812 Encounter for preprocedural laboratory examination: Secondary | ICD-10-CM | POA: Diagnosis present

## 2021-10-29 DIAGNOSIS — E210 Primary hyperparathyroidism: Secondary | ICD-10-CM | POA: Insufficient documentation

## 2021-10-29 DIAGNOSIS — I350 Nonrheumatic aortic (valve) stenosis: Secondary | ICD-10-CM | POA: Insufficient documentation

## 2021-10-29 DIAGNOSIS — I442 Atrioventricular block, complete: Secondary | ICD-10-CM

## 2021-10-29 DIAGNOSIS — Z8679 Personal history of other diseases of the circulatory system: Secondary | ICD-10-CM | POA: Diagnosis not present

## 2021-10-29 HISTORY — DX: Presence of cardiac pacemaker: Z95.0

## 2021-10-29 HISTORY — DX: Unspecified osteoarthritis, unspecified site: M19.90

## 2021-10-29 LAB — CUP PACEART REMOTE DEVICE CHECK
Battery Remaining Longevity: 83 mo
Battery Remaining Percentage: 67 %
Battery Voltage: 2.99 V
Brady Statistic AP VP Percent: 28 %
Brady Statistic AP VS Percent: 1 %
Brady Statistic AS VP Percent: 71 %
Brady Statistic AS VS Percent: 1 %
Brady Statistic RA Percent Paced: 28 %
Brady Statistic RV Percent Paced: 99 %
Date Time Interrogation Session: 20230810020031
Implantable Lead Implant Date: 20191112
Implantable Lead Implant Date: 20191112
Implantable Lead Location: 753859
Implantable Lead Location: 753860
Implantable Pulse Generator Implant Date: 20191112
Lead Channel Impedance Value: 390 Ohm
Lead Channel Impedance Value: 550 Ohm
Lead Channel Pacing Threshold Amplitude: 0.5 V
Lead Channel Pacing Threshold Amplitude: 0.5 V
Lead Channel Pacing Threshold Pulse Width: 0.5 ms
Lead Channel Pacing Threshold Pulse Width: 0.5 ms
Lead Channel Sensing Intrinsic Amplitude: 4.5 mV
Lead Channel Sensing Intrinsic Amplitude: 7.9 mV
Lead Channel Setting Pacing Amplitude: 0.75 V
Lead Channel Setting Pacing Amplitude: 1.5 V
Lead Channel Setting Pacing Pulse Width: 0.5 ms
Lead Channel Setting Sensing Sensitivity: 3.5 mV
Pulse Gen Model: 2272
Pulse Gen Serial Number: 9086757

## 2021-10-29 LAB — BASIC METABOLIC PANEL
Anion gap: 3 — ABNORMAL LOW (ref 5–15)
BUN: 32 mg/dL — ABNORMAL HIGH (ref 8–23)
CO2: 26 mmol/L (ref 22–32)
Calcium: 10.6 mg/dL — ABNORMAL HIGH (ref 8.9–10.3)
Chloride: 116 mmol/L — ABNORMAL HIGH (ref 98–111)
Creatinine, Ser: 1.25 mg/dL — ABNORMAL HIGH (ref 0.61–1.24)
GFR, Estimated: >60 mL/min (ref >60)
Glucose, Bld: 101 mg/dL — ABNORMAL HIGH (ref 70–99)
Potassium: 4.6 mmol/L (ref 3.5–5.1)
Sodium: 145 mmol/L (ref 135–145)

## 2021-10-29 LAB — CBC
HCT: 45.1 % (ref 39.0–52.0)
Hemoglobin: 14.1 g/dL (ref 13.0–17.0)
MCH: 28.8 pg (ref 26.0–34.0)
MCHC: 31.3 g/dL (ref 30.0–36.0)
MCV: 92.2 fL (ref 80.0–100.0)
Platelets: 208 10*3/uL (ref 150–400)
RBC: 4.89 MIL/uL (ref 4.22–5.81)
RDW: 13.7 % (ref 11.5–15.5)
WBC: 5.9 10*3/uL (ref 4.0–10.5)
nRBC: 0 % (ref 0.0–0.2)

## 2021-10-29 NOTE — Progress Notes (Addendum)
For Short Stay: [place] appointment date: Date of COVID positive in last 1 days:  Bowel Prep reminder:   For Anesthesia: PCP - Dr. Aundria Mems Cardiologist - Dr. [name]. LOV: 05/18/21  Chest x-ray - 01/06/21 EKG - 01/06/21 Stress Test -  ECHO -  Cardiac Cath -  Pacemaker/ICD device last checked: 07/30/21 Pacemaker orders received: Yes Device Rep notified:Yes. Windle Guard is aware.  Spinal Cord Stimulator:  Sleep Study -  CPAP -   Fasting Blood Sugar -  Checks Blood Sugar _____ times a day Date and result of last Hgb A1c-  Blood Thinner Instructions: Aspirin Instructions: Last Dose:  Activity level: Can go up a flight of stairs and activities of daily living without stopping and without chest pain and/or shortness of breath   Able to exercise without chest pain and/or shortness of breath   Unable to go up a flight of stairs without chest pain and/or shortness of breath     Anesthesia review: Hx: HTN,Heart murmur,Aorta stenosis,Pacemaker  Patient denies shortness of breath, fever, cough and chest pain at PAT appointment   Patient verbalized understanding of instructions that were given to them at the PAT appointment. Patient was also instructed that they will need to review over the PAT instructions again at home before surgery.

## 2021-10-30 NOTE — Progress Notes (Signed)
Anesthesia Chart Review:   Case: [id number] Date/Time: 11/06/21 0715   Procedure: LEFT PARATHYROIDECTOMY (Left)   Anesthesia type: General   Pre-op diagnosis: primary hyperparathyroidism   Location: [facility] ROOM 04 / [facility]   Surgeons: Armandina Gemma, MD       DISCUSSION: Pt is 66 years old with hx aortic stenosis (s/p AVR 2015 and s/p redo AVR 2019 with human allograft after developing endocardiits), TAA (s/p aortic root replacement, TAA repair 2019), complete heart block (after AVR 2019), pacemaker ([employer]. Jude, implanted 01/31/18), PFO (closed during AVR 2015)  Requires SBE prophylaxis before procedures.   Perioperative prescription for pacemaker 10/28/21:  Have magnet available. Provide continuous ECG monitoring when magnet is used or reprogramming is to be performed.  Procedure will likely interfere with device function.  Device should be programmed:  Asynchronous pacing during procedure and returned to normal programming after procedure   VS: BP (!) 147/91   Pulse 64   Temp 36.7 C (Oral)   Ht '5\' 11"'$  (1.803 m)   Wt 92.1 kg   SpO2 100%   BMI 28.31 kg/m   PROVIDERS: - PCP is [name], MD - Cardiologist is [name], MD. Last office visit 05/18/21 - EP cardiologist is Thompson Grayer, MD. Last office visit 01/08/21 with Tommye Standard, PA  LABS: Labs reviewed: Acceptable for surgery. (all labs ordered are listed, but only abnormal results are displayed)  Labs Reviewed  BASIC METABOLIC PANEL - Abnormal; Notable for the following components:      Result Value   Chloride 116 (*)    Glucose, Bld 101 (*)    BUN 32 (*)    Creatinine, Ser 1.25 (*)    Calcium 10.6 (*)    Anion gap 3 (*)    All other components within normal limits  CBC     IMAGES: CXR 01/06/21:  - No acute cardiopulmonary disease  EKG 01/06/21: SR with 1st degree AV block. Possible LAE. LAD. LBBB   CV: Echo 05/23/18:  1. The left ventricle has low normal systolic function, with an ejection  fraction of 50-55%. The cavity size was mildly dilated. Left ventricular diastolic parameters were normal There is abnormal septal motion consistent with RV pacemaker.  2. The right ventricle has mildly reduced systolic function. The cavity was mildly enlarged. There is no increase in right ventricular wall thickness. Right ventricular systolic pressure is normal with an estimated pressure of 25.8 mmHg.  3. Left atrial size was mildly dilated.  4. Right atrial size was mildly dilated.  5. The mitral valve is normal in structure. Mild thickening of the mitral valve leaflet. There is mild mitral annular calcification present. Mitral valve regurgitation is mild to moderate by color flow Doppler. The MR jet is eccentric medially directed.  6. The tricuspid valve is normal in structure.  7. A Human allograft valve is present in the aortic position (including aortic root replacement, repair ascending thoracic aortic aneurysm; Procedure Date: 01/26/2018).  8. Normal aortic valve prosthesis. Peak and mean gradients are 12 and 7 mm Hg, respectively.  9. Aortic valve regurgitation is trivial by color flow Doppler.  10. The pulmonic valve was normal in structure.  11. The aortic root and ascending aorta are normal in size and structure.    Past Medical History:  Diagnosis Date   Aortic stenosis    Arthritis    Ascending aorta enlargement (HCC)    Bicuspid aortic valve    Blood transfusion without reported diagnosis  Heart murmur    Hypertension    Patent foramen ovale    Closed at the time of aortic valve replacement   Pilonidal cyst    Presence of permanent cardiac pacemaker    S/P aortic valve replacement with bioprosthetic valve 12/05/2013   25 mm [facility] Ease bovine pericardial tissue valve    S/P redo aortic valve replacement with human allograft aortic root graft 01/26/2018   Septic embolism (HCC)    Brain, spleen, superior mesenteric artery    Past Surgical History:  Procedure  Laterality Date   AORTIC VALVE REPLACEMENT N/A 12/05/2013   Procedure: AORTIC VALVE REPLACEMENT (AVR);  Surgeon: [name], MD;  Location: [place];  Service: Open Heart Surgery;  Laterality: N/A;   AORTIC VALVE REPLACEMENT N/A 01/26/2018   Procedure: REDO STERNOTOMY, REDO AORTIC VALVE REPLACEMENT WITH HUMAN ALLOGRAFT INCLUDING AORTIC ROOT REPLACEMENT,  REPAIR ASCENDING THORACIC AORTIC ANEURYSM;  Surgeon: [name], MD;  Location: [place];  Service: Open Heart Surgery;  Laterality: N/A;   HAND SURGERY Left 07/1977   HIP ARTHROPLASTY Left 02/18/2021   INTRAOPERATIVE TRANSESOPHAGEAL ECHOCARDIOGRAM N/A 12/05/2013   Procedure: INTRAOPERATIVE TRANSESOPHAGEAL ECHOCARDIOGRAM;  Surgeon: [name], MD;  Location: [place];  Service: Open Heart Surgery;  Laterality: N/A;   LEFT AND RIGHT HEART CATHETERIZATION WITH CORONARY ANGIOGRAM N/A 11/08/2013   Procedure: LEFT AND RIGHT HEART CATHETERIZATION WITH CORONARY ANGIOGRAM;  Surgeon: Blane Ohara, MD;  Location: [facility] CATH LAB;  Service: Cardiovascular;  Laterality: N/A;   PACEMAKER IMPLANT N/A 01/31/2018   SJM Assurity MRI DR implanted by Dr Rayann Heman for complete heart block   PATENT FORAMEN OVALE CLOSURE N/A 12/05/2013   Procedure: PATENT FORAMEN OVALE CLOSURE;  Surgeon: [name], MD;  Location: [place];  Service: Open Heart Surgery;  Laterality: N/A;   PILONIDAL CYST / SINUS EXCISION  01/20/2018   Dr Brantley Stage   TEE WITHOUT CARDIOVERSION N/A 10/23/2013   Procedure: TRANSESOPHAGEAL ECHOCARDIOGRAM (TEE);  Surgeon: Larey Dresser, MD;  Location: [place];  Service: Cardiovascular;  Laterality: N/A;   TEE WITHOUT CARDIOVERSION N/A 01/19/2018   Procedure: TRANSESOPHAGEAL ECHOCARDIOGRAM (TEE);  Surgeon: Sanda Klein, MD;  Location: [place];  Service: Cardiovascular;  Laterality: N/A;   TEE WITHOUT CARDIOVERSION N/A 01/26/2018   Procedure: TRANSESOPHAGEAL ECHOCARDIOGRAM (TEE);  Surgeon: [name], MD;  Location: [place];  Service:  Open Heart Surgery;  Laterality: N/A;   VASECTOMY  06/2002    MEDICATIONS:  allopurinol (ZYLOPRIM) 300 MG tablet   APPLE CIDER VINEGAR PO   Ascorbic Acid (VITAMIN C) 1000 MG tablet   aspirin EC 81 MG tablet   bisacodyl (DULCOLAX) 5 MG EC tablet   Boswellia-Glucosamine-Vit D (GLUCOSAMINE COMPLEX PO)   celecoxib (CELEBREX) 200 MG capsule   Cholecalciferol (VITAMIN D3 PO)   Coenzyme Q10 300 MG CAPS   COLLAGEN PO   Cyanocobalamin (B-12 PO)   Omega 3 1000 MG CAPS   Probiotic Product (PROBIOTIC PO)   TURMERIC PO   No current facility-administered medications for this encounter.    If no changes, I anticipate pt can proceed with surgery as scheduled.   Willeen Cass, PhD, FNP-BC [facility]/Anesthesiology Phone: [phone] 10/30/2021 12:16 PM

## 2021-10-30 NOTE — Anesthesia Preprocedure Evaluation (Addendum)
Anesthesia Evaluation  Patient identified by MRN, date of birth, ID band Patient awake    Reviewed: Allergy & Precautions, NPO status , Patient's Chart, lab work & pertinent test results  Airway Mallampati: II  TM Distance: >3 FB Neck ROM: Full    Dental no notable dental hx. (+) Teeth Intact, Dental Advisory Given   Pulmonary neg pulmonary ROS,    Pulmonary exam normal breath sounds clear to auscultation       Cardiovascular hypertension, Normal cardiovascular exam+ pacemaker + Valvular Problems/Murmurs AI  Rhythm:Regular Rate:Normal     Neuro/Psych negative neurological ROS  negative psych ROS   GI/Hepatic negative GI ROS, Neg liver ROS,   Endo/Other    Renal/GU negative Renal ROS     Musculoskeletal  (+) Arthritis ,   Abdominal   Peds  Hematology Lab Results      Component                Value               Date                           HGB                      14.1                10/29/2021                HCT                      45.1                10/29/2021                  PLT                      208                 10/29/2021              Anesthesia Other Findings   Reproductive/Obstetrics                           Anesthesia Physical Anesthesia Plan  ASA: 2  Anesthesia Plan: General   Post-op Pain Management: Minimal or no pain anticipated   Induction: Intravenous  PONV Risk Score and Plan: 3 and Treatment may vary due to age or medical condition, Midazolam and Ondansetron  Airway Management Planned: Oral ETT  Additional Equipment: None  Intra-op Plan:   Post-operative Plan: Extubation in OR  Informed Consent:     Dental advisory given  Plan Discussed with:   Anesthesia Plan Comments: (See APP note by Durel Salts, FNP )       Anesthesia Quick Evaluation

## 2021-11-06 ENCOUNTER — Encounter (HOSPITAL_COMMUNITY): Payer: Self-pay | Admitting: Surgery

## 2021-11-06 ENCOUNTER — Ambulatory Visit (HOSPITAL_COMMUNITY)
Admission: RE | Admit: 2021-11-06 | Discharge: 2021-11-06 | Disposition: A | Discharge status: Home / Self Care | Payer: 59 | Attending: Surgery | Admitting: Surgery

## 2021-11-06 ENCOUNTER — Encounter (HOSPITAL_COMMUNITY)
Admission: RE | Disposition: A | Discharge status: Home / Self Care | Payer: Self-pay | Source: Home / Self Care | Attending: Surgery

## 2021-11-06 ENCOUNTER — Other Ambulatory Visit: Payer: Self-pay

## 2021-11-06 ENCOUNTER — Ambulatory Visit (HOSPITAL_BASED_OUTPATIENT_CLINIC_OR_DEPARTMENT_OTHER): Payer: 59 | Admitting: Anesthesiology

## 2021-11-06 ENCOUNTER — Ambulatory Visit (HOSPITAL_COMMUNITY): Payer: 59 | Admitting: Emergency Medicine

## 2021-11-06 DIAGNOSIS — E210 Primary hyperparathyroidism: Secondary | ICD-10-CM | POA: Diagnosis not present

## 2021-11-06 DIAGNOSIS — Z87442 Personal history of urinary calculi: Secondary | ICD-10-CM | POA: Insufficient documentation

## 2021-11-06 DIAGNOSIS — Z952 Presence of prosthetic heart valve: Secondary | ICD-10-CM | POA: Insufficient documentation

## 2021-11-06 DIAGNOSIS — M8580 Other specified disorders of bone density and structure, unspecified site: Secondary | ICD-10-CM | POA: Diagnosis not present

## 2021-11-06 DIAGNOSIS — D351 Benign neoplasm of parathyroid gland: Secondary | ICD-10-CM | POA: Diagnosis not present

## 2021-11-06 DIAGNOSIS — Z950 Presence of cardiac pacemaker: Secondary | ICD-10-CM | POA: Diagnosis not present

## 2021-11-06 HISTORY — PX: PARATHYROIDECTOMY: SHX19

## 2021-11-06 SURGERY — PARATHYROIDECTOMY
Anesthesia: General | Site: Neck | Laterality: Left

## 2021-11-06 MED ORDER — BUPIVACAINE HCL (PF) 0.25 % IJ SOLN
INTRAMUSCULAR | Status: DC | PRN
  Administered 2021-11-06: 10 mL

## 2021-11-06 MED ORDER — BUPIVACAINE HCL 0.25 % IJ SOLN
INTRAMUSCULAR | Status: AC
  Filled 2021-11-06: qty 1

## 2021-11-06 MED ORDER — CHLORHEXIDINE GLUCONATE CLOTH 2 % EX PADS: 6.0000 | MEDICATED_PAD | Freq: Once | CUTANEOUS | Status: DC

## 2021-11-06 MED ORDER — ONDANSETRON HCL 4 MG/2ML IJ SOLN
INTRAMUSCULAR | Status: DC | PRN
  Administered 2021-11-06: 4 mg via INTRAVENOUS

## 2021-11-06 MED ORDER — DEXAMETHASONE SODIUM PHOSPHATE 10 MG/ML IJ SOLN
INTRAMUSCULAR | Status: AC
Start: 2021-11-06 — End: ?
  Filled 2021-11-06: qty 1

## 2021-11-06 MED ORDER — OXYCODONE HCL 5 MG/5ML PO SOLN: 5.0000 mg | Freq: Once | ORAL | Status: DC | PRN

## 2021-11-06 MED ORDER — ONDANSETRON HCL 4 MG/2ML IJ SOLN: 4.0000 mg | Freq: Once | INTRAMUSCULAR | Status: DC | PRN

## 2021-11-06 MED ORDER — OXYCODONE HCL 5 MG PO TABS: 5.0000 mg | ORAL_TABLET | Freq: Once | ORAL | Status: DC | PRN

## 2021-11-06 MED ORDER — LACTATED RINGERS IV SOLN: INTRAVENOUS | Status: DC

## 2021-11-06 MED ORDER — DEXAMETHASONE SODIUM PHOSPHATE 10 MG/ML IJ SOLN
INTRAMUSCULAR | Status: DC | PRN
  Administered 2021-11-06: 10 mg via INTRAVENOUS

## 2021-11-06 MED ORDER — KETOROLAC TROMETHAMINE 30 MG/ML IJ SOLN
INTRAMUSCULAR | Status: AC
  Filled 2021-11-06: qty 1

## 2021-11-06 MED ORDER — MIDAZOLAM HCL 2 MG/2ML IJ SOLN
INTRAMUSCULAR | Status: AC
  Filled 2021-11-06: qty 2

## 2021-11-06 MED ORDER — ROCURONIUM BROMIDE 10 MG/ML (PF) SYRINGE
PREFILLED_SYRINGE | INTRAVENOUS | Status: AC
  Filled 2021-11-06: qty 10

## 2021-11-06 MED ORDER — HYDROMORPHONE HCL 1 MG/ML IJ SOLN
0.2500 mg | INTRAMUSCULAR | Status: DC | PRN
  Administered 2021-11-06: 0.5 mg via INTRAVENOUS

## 2021-11-06 MED ORDER — KETOROLAC TROMETHAMINE 30 MG/ML IJ SOLN
30.0000 mg | Freq: Once | INTRAMUSCULAR | Status: AC | PRN
  Administered 2021-11-06: 30 mg via INTRAVENOUS

## 2021-11-06 MED ORDER — MIDAZOLAM HCL 5 MG/5ML IJ SOLN
INTRAMUSCULAR | Status: DC | PRN
  Administered 2021-11-06: 2 mg via INTRAVENOUS

## 2021-11-06 MED ORDER — PROPOFOL 10 MG/ML IV BOLUS
INTRAVENOUS | Status: DC | PRN
  Administered 2021-11-06: 170 mg via INTRAVENOUS

## 2021-11-06 MED ORDER — SUCCINYLCHOLINE CHLORIDE 200 MG/10ML IV SOSY
PREFILLED_SYRINGE | INTRAVENOUS | Status: AC
  Filled 2021-11-06: qty 10

## 2021-11-06 MED ORDER — SUGAMMADEX SODIUM 200 MG/2ML IV SOLN
INTRAVENOUS | Status: DC | PRN
  Administered 2021-11-06: 200 mg via INTRAVENOUS

## 2021-11-06 MED ORDER — HYDROMORPHONE HCL 1 MG/ML IJ SOLN
INTRAMUSCULAR | Status: AC
  Filled 2021-11-06: qty 1

## 2021-11-06 MED ORDER — 0.9 % SODIUM CHLORIDE (POUR BTL) OPTIME
TOPICAL | Status: DC | PRN
  Administered 2021-11-06: 1000 mL

## 2021-11-06 MED ORDER — TRAMADOL HCL 50 MG PO TABS: 50.0000 mg | ORAL_TABLET | Freq: Four times a day (QID) | ORAL | 0 refills | Status: DC | PRN

## 2021-11-06 MED ORDER — PHENYLEPHRINE HCL-NACL 20-0.9 MG/250ML-% IV SOLN
INTRAVENOUS | Status: AC
  Filled 2021-11-06: qty 250

## 2021-11-06 MED ORDER — LIDOCAINE 2% (20 MG/ML) 5 ML SYRINGE
INTRAMUSCULAR | Status: AC
  Filled 2021-11-06: qty 5

## 2021-11-06 MED ORDER — CHLORHEXIDINE GLUCONATE 0.12 % MT SOLN
15.0000 mL | Freq: Once | OROMUCOSAL | Status: AC
  Administered 2021-11-06: 15 mL via OROMUCOSAL

## 2021-11-06 MED ORDER — FENTANYL CITRATE (PF) 100 MCG/2ML IJ SOLN
INTRAMUSCULAR | Status: AC
  Filled 2021-11-06: qty 2

## 2021-11-06 MED ORDER — ONDANSETRON HCL 4 MG/2ML IJ SOLN
INTRAMUSCULAR | Status: AC
  Filled 2021-11-06: qty 2

## 2021-11-06 MED ORDER — CEFAZOLIN SODIUM-DEXTROSE 2-4 GM/100ML-% IV SOLN
2.0000 g | INTRAVENOUS | Status: AC
  Administered 2021-11-06: 2 g via INTRAVENOUS
  Filled 2021-11-06: qty 100

## 2021-11-06 MED ORDER — LIDOCAINE HCL (CARDIAC) PF 100 MG/5ML IV SOSY
PREFILLED_SYRINGE | INTRAVENOUS | Status: DC | PRN
  Administered 2021-11-06: 100 mg via INTRAVENOUS

## 2021-11-06 MED ORDER — ORAL CARE MOUTH RINSE: 15.0000 mL | Freq: Once | OROMUCOSAL | Status: AC

## 2021-11-06 MED ORDER — FENTANYL CITRATE (PF) 100 MCG/2ML IJ SOLN
INTRAMUSCULAR | Status: DC | PRN
Start: 2021-11-06 — End: 2021-11-06
  Administered 2021-11-06 (×2): 50 ug via INTRAVENOUS

## 2021-11-06 MED ORDER — HEMOSTATIC AGENTS (NO CHARGE) OPTIME
TOPICAL | Status: DC | PRN
  Administered 2021-11-06: 1

## 2021-11-06 MED ORDER — PROPOFOL 10 MG/ML IV BOLUS
INTRAVENOUS | Status: AC
  Filled 2021-11-06: qty 20

## 2021-11-06 SURGICAL SUPPLY — 30 items
ATTRACTOMAT 16X20 MAGNETIC DRP (DRAPES) ×1 IMPLANT
BLADE SURG 15 STRL LF DISP TIS (BLADE) ×1 IMPLANT
BLADE SURG 15 STRL SS (BLADE) ×1
CHLORAPREP W/TINT 26 (MISCELLANEOUS) ×1 IMPLANT
CLIP TI MEDIUM 6 (CLIP) ×2 IMPLANT
CLIP TI WIDE RED SMALL 6 (CLIP) ×2 IMPLANT
COVER SURGICAL LIGHT HANDLE (MISCELLANEOUS) ×1 IMPLANT
DERMABOND IMPLANT
DRAPE LAPAROTOMY T 98X78 PEDS (DRAPES) ×1 IMPLANT
DRAPE UTILITY XL STRL (DRAPES) ×1 IMPLANT
ELECT REM PT RETURN 15FT ADLT (MISCELLANEOUS) ×1 IMPLANT
GAUZE 4X4 16PLY ~~LOC~~+RFID DBL (SPONGE) ×1 IMPLANT
GLOVE SURG ORTHO 8.0 STRL STRW (GLOVE) ×1 IMPLANT
GOWN STRL REUS W/ TWL XL LVL3 (GOWN DISPOSABLE) ×3 IMPLANT
GOWN STRL REUS W/TWL XL LVL3 (GOWN DISPOSABLE) ×3
HEMOSTAT SURGICEL 2X4 FIBR (HEMOSTASIS) ×1 IMPLANT
ILLUMINATOR WAVEGUIDE N/F (MISCELLANEOUS) IMPLANT
KIT BASIN OR (CUSTOM PROCEDURE TRAY) ×1 IMPLANT
KIT TURNOVER KIT A (KITS) IMPLANT
NDL HYPO 25X1 1.5 SAFETY (NEEDLE) ×1 IMPLANT
NEEDLE HYPO 25X1 1.5 SAFETY (NEEDLE) ×1 IMPLANT
PACK BASIC VI WITH GOWN DISP (CUSTOM PROCEDURE TRAY) ×1 IMPLANT
PENCIL SMOKE EVACUATOR (MISCELLANEOUS) ×1 IMPLANT
SUT MNCRL AB 4-0 PS2 18 (SUTURE) ×1 IMPLANT
SUT VIC AB 3-0 SH 18 (SUTURE) ×1 IMPLANT
SYR BULB IRRIG 60ML STRL (SYRINGE) ×1 IMPLANT
SYR CONTROL 10ML LL (SYRINGE) ×1 IMPLANT
TOWEL OR 17X26 10 PK STRL BLUE (TOWEL DISPOSABLE) ×1 IMPLANT
TOWEL OR NON WOVEN STRL DISP B (DISPOSABLE) ×1 IMPLANT
TUBING CONNECTING 10 (TUBING) ×1 IMPLANT

## 2021-11-06 NOTE — Anesthesia Postprocedure Evaluation (Signed)
Anesthesia Post Note  Patient: Cordarrel Stiefel  Procedure(s) Performed: LEFT PARATHYROIDECTOMY (Left: Neck)     Patient location during evaluation: PACU Anesthesia Type: General Level of consciousness: awake and alert Pain management: pain level controlled Vital Signs Assessment: post-procedure vital signs reviewed and stable Respiratory status: spontaneous breathing, nonlabored ventilation, respiratory function stable and patient connected to nasal cannula oxygen Cardiovascular status: blood pressure returned to baseline and stable Postop Assessment: no apparent nausea or vomiting Anesthetic complications: no   No notable events documented.  Last Vitals:  Vitals:   11/06/21 0900 11/06/21 0915  BP: (!) 141/90 129/82  Pulse: (!) 59 60  Resp: 13 (!) 9  Temp:    SpO2: 97% 95%    Last Pain:  Vitals:   11/06/21 0915  TempSrc:   PainSc: 0-No pain                 Barnet Glasgow

## 2021-11-06 NOTE — Op Note (Signed)
Operative Note  Pre-operative Diagnosis:  primary hyperparathyroidism  Post-operative Diagnosis:  same  Surgeon:  Armandina Gemma, MD  Assistant:  none   Procedure:  Neck exploration, left superior and inferior parathyroidectomy  Anesthesia:  general  Estimated Blood Loss:  minimal  Drains: none         Specimen: left inferior and superior parathyroid glands to pathology  Indications:  Patient is referred by Dr. Vivia Walter for surgical evaluation and management of primary hyperparathyroidism. Patient had been noted by his primary care physician to have elevated serum calcium levels over 1 year ago. Patient was referred to endocrinology. Additional testing showed a calcium level of 10.2, and intact PTH level of 102, a 25-hydroxy vitamin D level of 32.86, and a 24-hour urine collection for calcium of 256. Patient has noted some mild fatigue. He is also had some bone and joint discomfort. He does have nephrolithiasis. Bone density scanning showed osteopenia.  Imaging studies included an ultrasound examination of the neck as well as a nuclear medicine parathyroid scan with sestamibi.  Both studies localized a left inferior parathyroid adenoma.  Patient now comes to surgery for parathyroidectomy.  Procedure:  The patient was seen in the pre-op holding area. The risks, benefits, complications, treatment options, and expected outcomes were previously discussed with the patient. The patient agreed with the proposed plan and has signed the informed consent form.  The patient was brought to the operating room by the surgical team, identified as Patrick Walter and the procedure verified. A "time out" was completed and the above information confirmed.  Following administration of general anesthesia the patient is positioned and then prepped and draped in the usual aseptic fashion.  After ascertaining that an adequate level of anesthesia been achieved, a left inferior neck incision is made with a #15  blade.  Dissection was carried through subcutaneous tissues and platysma.  Hemostasis is achieved with the electrocautery.  Skin flaps are developed circumferentially and a self-retaining retractors placed for exposure.  Strap muscles are incised in the midline and reflected to the left.  Left thyroid lobe was exposed.  It appears normal.  Left lobe is mobilized anteriorly.  Venous tributaries are divided between small and medium ligaclips.  At the inferior pole of the left lobe of the thyroid is identified parathyroid tissue with surrounding adipose tissue.  It is mobilized.  It appears slightly enlarged.  Vascular structures are divided between small ligaclips and the gland is excised.  Ex vivo this appears to be a normal parathyroid gland.  It is submitted for frozen section which confirms parathyroid tissue which appears normal.  Further dissection in the left neck allows for exposure back to the precervical fascia.  Exploration superiorly posterior to the superior pole of the thyroid reveals an enlarged parathyroid gland laying adjacent to the hypopharynx.  This is gently mobilized.  Vascular structures are divided between small ligaclips and the gland is excised.  The left superior parathyroid appears enlarged and consistent with adenoma.  It is submitted to pathology for frozen section confirms hypercellular parathyroid tissue consistent with adenoma.  Neck is irrigated with warm saline.  Good hemostasis is noted.  Fibrillar is placed throughout the operative field.  Strap muscles are reapproximated in the midline of interrupted 3-0 Vicryl sutures.  Platysma is closed with interrupted 3-0 Vicryl sutures.  Skin is anesthetized with local anesthetic.  Skin edges are reapproximated with a running 4-0 Monocryl subcuticular suture.  Wound is washed and dried and Dermabond is applied as  dressing.  Patient is awakened from anesthesia and transported to the recovery room in stable condition.  The patient  tolerated the procedure well.   Armandina Gemma, [facility] Office: [phone]

## 2021-11-06 NOTE — Anesthesia Procedure Notes (Signed)
Procedure Name: Intubation Date/Time: 11/06/2021 7:23 AM  Performed by: Nazirah Tri, Forest Gleason, CRNAPre-anesthesia Checklist: Patient identified, Emergency Drugs available, Suction available, Patient being monitored and Timeout performed Patient Re-evaluated:Patient Re-evaluated prior to induction Oxygen Delivery Method: Circle system utilized Preoxygenation: Pre-oxygenation with 100% oxygen Induction Type: IV induction Ventilation: Mask ventilation without difficulty Laryngoscope Size: Mac and 4 Grade View: Grade I Tube type: Oral Tube size: 7.5 mm Number of attempts: 1 Airway Equipment and Method: Stylet Placement Confirmation: ETT inserted through vocal cords under direct vision, positive ETCO2, CO2 detector and breath sounds checked- equal and bilateral Secured at: 22 cm Tube secured with: Tape Dental Injury: Teeth and Oropharynx as per pre-operative assessment

## 2021-11-06 NOTE — Transfer of Care (Signed)
Immediate Anesthesia Transfer of Care Note  Patient: Patrick Walter  Procedure(s) Performed: LEFT PARATHYROIDECTOMY (Left: Neck)  Patient Location: PACU  Anesthesia Type:General  Level of Consciousness: awake  Airway & Oxygen Therapy: Patient Spontanous Breathing  Post-op Assessment: Report given to RN  Post vital signs: stable  Last Vitals:  Vitals Value Taken Time  BP 147/87 11/06/21 0838  Temp 35.7 C 11/06/21 0839  Pulse 64 11/06/21 0840  Resp 19 11/06/21 0840  SpO2 100 % 11/06/21 0840  Vitals shown include unvalidated device data.  Last Pain:  Vitals:   11/06/21 0556  TempSrc: Oral  PainSc:          Complications: No notable events documented.

## 2021-11-06 NOTE — Discharge Instructions (Signed)
[facility] - Dr. Demichael Traum  THYROID & PARATHYROID SURGERY:  POST-OP INSTRUCTIONS  Always review the instruction sheet provided by the hospital nurse at discharge.  A prescription for pain medication may be sent to your pharmacy at the time of discharge.  Take your pain medication as prescribed.  If narcotic pain medicine is not needed, then you may take acetaminophen (Tylenol) or ibuprofen (Advil) as needed for pain or soreness.  Take your normal home medications as prescribed unless otherwise directed.  If you need a refill on your pain medication, please contact the office during regular business hours.  Prescriptions will not be processed by the office after 5:00PM or on weekends.  Start with a light diet upon arrival home, such as soup and crackers or toast.  Be sure to drink plenty of fluids.  Resume your normal diet the day after surgery.  Most patients will experience some swelling and bruising on the chest and neck area.  Ice packs will help for the first 48 hours after arriving home.  Swelling and bruising will take several days to resolve.   It is common to experience some constipation after surgery.  Increasing fluid intake and taking a stool softener (Colace) will usually help to prevent this problem.  A mild laxative (Milk of Magnesia or Miralax) should be taken according to package directions if there has been no bowel movement after 48 hours.  Dermabond glue covers your incision. This seals the wound and you may shower at any time. The Dermabond will remain in place for about a week.  You may gradually remove the glue when it loosens around the edges.  If you need to loosen the Dermabond for removal, apply a layer of Vaseline to the wound for 15 minutes and then remove with a Kleenex. Your sutures are under the skin and will not show - they will dissolve on their own.  You may resume light daily activities beginning the day after discharge (such as self-care,  walking, climbing stairs), gradually increasing activities as tolerated. You may have sexual intercourse when it is comfortable. Refrain from any heavy lifting or straining until approved by your doctor. You may drive when you no longer are taking prescription pain medication, you can comfortably wear a seatbelt, and you can safely maneuver your car and apply the brakes.  You will see your doctor in the office for a follow-up appointment approximately three weeks after your surgery.  Make sure that you call for this appointment within a day or two after you arrive home to insure a convenient appointment time. Please have any requested laboratory tests performed a few days prior to your office visit so that the results will be available at your follow up appointment.  WHEN TO CALL THE [facility] OFFICE: -- Fever greater than 101.5 -- Inability to urinate -- Nausea and/or vomiting - persistent -- Extreme swelling or bruising -- Continued bleeding from incision -- Increased pain, redness, or drainage from the incision -- Difficulty swallowing or breathing -- Muscle cramping or spasms -- Numbness or tingling in hands or around lips  The clinic staff is available to answer your questions during regular business hours.  Please don't hesitate to call and ask to speak to one of the nurses if you have concerns.  [facility] OFFICE: [phone] (24 hours)  Please sign up for MyChart accounts. This will allow you to communicate directly with my nurse or myself without having to call the office. It will also allow you   to view your test results. You will need to enroll in MyChart for my office ([facility]) and for the hospital ([facility]).  Soua Caltagirone, MD [facility] A [facility] practice 

## 2021-11-06 NOTE — Interval H&P Note (Signed)
History and Physical Interval Note:  11/06/2021 7:04 AM  Patrick Walter  has presented today for surgery, with the diagnosis of primary hyperparathyroidism.  The various methods of treatment have been discussed with the patient and family. After consideration of risks, benefits and other options for treatment, the patient has consented to    Procedure(s): LEFT PARATHYROIDECTOMY (Left) as a surgical intervention.    The patient's history has been reviewed, patient examined, no change in status, stable for surgery.  I have reviewed the patient's chart and labs.  Questions were answered to the patient's satisfaction.    Armandina Gemma, [facility] Office: [place]

## 2021-11-06 NOTE — Progress Notes (Signed)
Per Dr. Valma Cava,  device rep is not needed for pacemaker intervention.

## 2021-11-07 ENCOUNTER — Encounter (HOSPITAL_COMMUNITY): Payer: Self-pay | Admitting: Surgery

## 2021-11-09 LAB — SURGICAL PATHOLOGY

## 2021-11-12 DIAGNOSIS — E210 Primary hyperparathyroidism: Secondary | ICD-10-CM | POA: Diagnosis not present

## 2021-11-12 DIAGNOSIS — E892 Postprocedural hypoparathyroidism: Secondary | ICD-10-CM | POA: Diagnosis not present

## 2021-11-24 NOTE — Progress Notes (Signed)
Remote pacemaker transmission.   

## 2022-01-28 ENCOUNTER — Ambulatory Visit (INDEPENDENT_AMBULATORY_CARE_PROVIDER_SITE_OTHER): Payer: 59

## 2022-01-28 DIAGNOSIS — I442 Atrioventricular block, complete: Secondary | ICD-10-CM

## 2022-02-01 LAB — CUP PACEART REMOTE DEVICE CHECK
Battery Remaining Longevity: 79 mo
Battery Remaining Percentage: 64 %
Battery Voltage: 2.99 V
Brady Statistic AP VP Percent: 30 %
Brady Statistic AP VS Percent: 1 %
Brady Statistic AS VP Percent: 69 %
Brady Statistic AS VS Percent: 1 %
Brady Statistic RA Percent Paced: 30 %
Brady Statistic RV Percent Paced: 99 %
Date Time Interrogation Session: 20231111075138
Implantable Lead Connection Status: 753985
Implantable Lead Connection Status: 753985
Implantable Lead Implant Date: 20191112
Implantable Lead Implant Date: 20191112
Implantable Lead Location: 753859
Implantable Lead Location: 753860
Implantable Pulse Generator Implant Date: 20191112
Lead Channel Impedance Value: 390 Ohm
Lead Channel Impedance Value: 550 Ohm
Lead Channel Pacing Threshold Amplitude: 0.5 V
Lead Channel Pacing Threshold Amplitude: 0.625 V
Lead Channel Pacing Threshold Pulse Width: 0.5 ms
Lead Channel Pacing Threshold Pulse Width: 0.5 ms
Lead Channel Sensing Intrinsic Amplitude: 10.5 mV
Lead Channel Sensing Intrinsic Amplitude: 3.1 mV
Lead Channel Setting Pacing Amplitude: 0.875
Lead Channel Setting Pacing Amplitude: 1.5 V
Lead Channel Setting Pacing Pulse Width: 0.5 ms
Lead Channel Setting Sensing Sensitivity: 3.5 mV
Pulse Gen Model: 2272
Pulse Gen Serial Number: 9086757

## 2022-02-05 NOTE — Progress Notes (Signed)
Remote pacemaker transmission.   

## 2022-02-18 ENCOUNTER — Encounter: Payer: Self-pay | Admitting: Internal Medicine

## 2022-02-18 ENCOUNTER — Ambulatory Visit: Payer: 59 | Admitting: Internal Medicine

## 2022-02-18 VITALS — BP 132/84 | HR 79 | Ht 71.0 in | Wt 204.0 lb

## 2022-02-18 DIAGNOSIS — E210 Primary hyperparathyroidism: Secondary | ICD-10-CM

## 2022-02-18 DIAGNOSIS — E892 Postprocedural hypoparathyroidism: Secondary | ICD-10-CM

## 2022-02-18 LAB — BASIC METABOLIC PANEL
BUN: 22 mg/dL (ref 6–23)
CO2: 29 mEq/L (ref 19–32)
Calcium: 9.5 mg/dL (ref 8.4–10.5)
Chloride: 106 mEq/L (ref 96–112)
Creatinine, Ser: 1.15 mg/dL (ref 0.40–1.50)
GFR: 67.32 mL/min (ref >60.00)
Glucose, Bld: 94 mg/dL (ref 70–99)
Potassium: 4.2 mEq/L (ref 3.5–5.1)
Sodium: 141 mEq/L (ref 135–145)

## 2022-02-18 LAB — VITAMIN D 25 HYDROXY (VIT D DEFICIENCY, FRACTURES): VITD: 47.02 ng/mL (ref 30.00–100.00)

## 2022-02-18 NOTE — Progress Notes (Signed)
Name: Patrick Walter  MRN/ DOB: [id number], 1955/11/14    Age/ Sex: 66 y.o., male     PCP: [name], MD   Reason for Endocrinology Evaluation: Hypercalcemia      Initial Endocrinology Clinic Visit: 03/26/2020    PATIENT IDENTIFIER: Patrick Walter is a 66 y.o., male with a past medical history of history of Aortic valve replacement (2015). He has followed with [facility] since 03/26/2020 for consultative assistance with management of his hypercalcemia    HISTORICAL SUMMARY: The patient was first diagnosed with hypercalcemia in 2019. With a max serum calcium of 11.4 mg/dL and a max PTH of 103 pg/mL in 09/2019.   DXA showed low bone density 04/04/2020  24 hr urinary excretion of calcium was 360 mg ( 03/2020)  repeat decreased to 256 mg by 07/2020 KUB showed renal stone 7 mm    He is S/P left superior and left inferior parathyroidectomy 11/06/2021 with normalization of his calcium to 9.1 as well as normal PTH at 30 on 11/12/2021    SUBJECTIVE:     Today (02/18/2022):  Patrick Walter is here for a follow up on Primary hyperparathyroidism.   He is S/P  Left superior and inferior parathyroidectomy 11/06/2021  He had tngling of the nck post op but no perioral tingling Denies spasms or constipation  Denies polyuria  No renal stones   Vitamin D 5000  iu daily  MVI   HISTORY:  Past Medical History:  Past Medical History:  Diagnosis Date   Aortic stenosis    Arthritis    Ascending aorta enlargement (HCC)    Bicuspid aortic valve    Blood transfusion without reported diagnosis    Heart murmur    Hypertension    Patent foramen ovale    Closed at the time of aortic valve replacement   Pilonidal cyst    Presence of permanent cardiac pacemaker    S/P aortic valve replacement with bioprosthetic valve 12/05/2013   25 mm [facility] Ease bovine pericardial tissue valve    S/P redo aortic valve replacement with human allograft aortic root graft  01/26/2018   Septic embolism (HCC)    Brain, spleen, superior mesenteric artery   Past Surgical History:  Past Surgical History:  Procedure Laterality Date   AORTIC VALVE REPLACEMENT N/A 12/05/2013   Procedure: AORTIC VALVE REPLACEMENT (AVR);  Surgeon: [name], MD;  Location: [place];  Service: Open Heart Surgery;  Laterality: N/A;   AORTIC VALVE REPLACEMENT N/A 01/26/2018   Procedure: REDO STERNOTOMY, REDO AORTIC VALVE REPLACEMENT WITH HUMAN ALLOGRAFT INCLUDING AORTIC ROOT REPLACEMENT,  REPAIR ASCENDING THORACIC AORTIC ANEURYSM;  Surgeon: [name], MD;  Location: [place];  Service: Open Heart Surgery;  Laterality: N/A;   HAND SURGERY Left 07/1977   HIP ARTHROPLASTY Left 02/18/2021   INTRAOPERATIVE TRANSESOPHAGEAL ECHOCARDIOGRAM N/A 12/05/2013   Procedure: INTRAOPERATIVE TRANSESOPHAGEAL ECHOCARDIOGRAM;  Surgeon: [name], MD;  Location: [place];  Service: Open Heart Surgery;  Laterality: N/A;   LEFT AND RIGHT HEART CATHETERIZATION WITH CORONARY ANGIOGRAM N/A 11/08/2013   Procedure: LEFT AND RIGHT HEART CATHETERIZATION WITH CORONARY ANGIOGRAM;  Surgeon: Blane Ohara, MD;  Location: [facility] CATH LAB;  Service: Cardiovascular;  Laterality: N/A;   PACEMAKER IMPLANT N/A 01/31/2018   SJM Assurity MRI DR implanted by Dr Rayann Heman for complete heart block   PARATHYROIDECTOMY Left 11/06/2021   Procedure: LEFT PARATHYROIDECTOMY;  Surgeon: Armandina Gemma, MD;  Location[facility];  Service: General;  Laterality: Left;  PATENT FORAMEN OVALE CLOSURE N/A 12/05/2013   Procedure: PATENT FORAMEN OVALE CLOSURE;  Surgeon: [name], MD;  Location: [place];  Service: Open Heart Surgery;  Laterality: N/A;   PILONIDAL CYST / SINUS EXCISION  01/20/2018   Dr Brantley Stage   TEE WITHOUT CARDIOVERSION N/A 10/23/2013   Procedure: TRANSESOPHAGEAL ECHOCARDIOGRAM (TEE);  Surgeon: Larey Dresser, MD;  Location: [place];  Service: Cardiovascular;  Laterality: N/A;   TEE WITHOUT CARDIOVERSION N/A 01/19/2018    Procedure: TRANSESOPHAGEAL ECHOCARDIOGRAM (TEE);  Surgeon: Sanda Klein, MD;  Location: [place];  Service: Cardiovascular;  Laterality: N/A;   TEE WITHOUT CARDIOVERSION N/A 01/26/2018   Procedure: TRANSESOPHAGEAL ECHOCARDIOGRAM (TEE);  Surgeon: [name], MD;  Location: [place];  Service: Open Heart Surgery;  Laterality: N/A;   VASECTOMY  06/2002   Social History:  reports that he has never smoked. He has never used smokeless tobacco. He reports current alcohol use. He reports that he does not use drugs. Family History:  Family History  Problem Relation Age of Onset   Diabetes Mother    Cancer Mother        colon   Diabetes Father    Cancer Father        colon   Colon cancer Father 49   Colon polyps Father    Diabetes Paternal Grandmother    Cancer Paternal Grandmother    Colon cancer Paternal Grandmother 26   Colon polyps Paternal Grandmother    Cancer Daughter        leukemia   Heart attack Maternal Grandmother    Hypertension Maternal Grandmother    Diabetes Maternal Grandmother    Cancer Maternal Grandmother    Diabetes Maternal Grandfather    Cancer Maternal Grandfather    Stroke Paternal Grandfather    Diabetes Paternal Grandfather    Cancer Paternal Grandfather    Esophageal cancer Neg Hx    Stomach cancer Neg Hx      HOME MEDICATIONS: Allergies as of 02/18/2022   No Known Allergies      Medication List        Accurate as of February 18, 2022  2:59 PM. If you have any questions, ask your nurse or doctor.          STOP taking these medications    allopurinol 300 MG tablet Commonly known as: ZYLOPRIM Stopped by: Dorita Sciara, MD   celecoxib 200 MG capsule Commonly known as: CELEBREX Stopped by: Dorita Sciara, MD       TAKE these medications    APPLE CIDER VINEGAR PO Take 1 tablet by mouth daily.   aspirin EC 81 MG tablet Take 81 mg by mouth daily.   B-12 PO Take 1 tablet by mouth daily.   bisacodyl 5 MG EC  tablet Commonly known as: DULCOLAX Take 5 mg by mouth daily as needed for moderate constipation.   Coenzyme Q10 300 MG Caps Take 300 mg by mouth daily.   COLLAGEN PO Take 1 tablet by mouth daily.   GLUCOSAMINE COMPLEX PO Take 1 capsule by mouth daily.   Omega 3 1000 MG Caps Take 3,000 mg by mouth daily.   PROBIOTIC PO Take 1 capsule by mouth daily.   traMADol 50 MG tablet Commonly known as: ULTRAM Take 1-2 tablets (50-100 mg total) by mouth every 6 (six) hours as needed for moderate pain.   TURMERIC PO Take 1 capsule by mouth daily.   vitamin C 1000 MG tablet Take 1,000 mg by mouth  daily.   VITAMIN D3 PO Take 1 tablet by mouth daily.          OBJECTIVE:   PHYSICAL EXAM: VS: BP 132/84 (BP Location: Left Arm, Patient Position: Sitting, Cuff Size: Large)   Pulse 79   Ht '5\' 11"'$  (1.803 m)   Wt 204 lb (92.5 kg) Comment: patient reported  SpO2 97%   BMI 28.45 kg/m    EXAM: General: Pt appears well and is in NAD  Neck: General: Supple without adenopathy. Thyroid: Thyroid size normal.  No goiter or nodules appreciated.   Lungs: Clear with good BS bilat with no rales, rhonchi, or wheezes  Heart: Auscultation: RRR.  Abdomen: Normoactive bowel sounds, soft, nontender, without masses or organomegaly palpable  Extremities:  BL LE: No pretibial edema normal ROM and strength.  Mental Status: Judgment, insight: Intact Orientation: Oriented to time, place, and person Mood and affect: No depression, anxiety, or agitation     DATA REVIEWED:  Latest Reference Range & Units 02/18/22 08:51  Sodium 135 - 145 mEq/L 141  Potassium 3.5 - 5.1 mEq/L 4.2  Chloride 96 - 112 mEq/L 106  CO2 19 - 32 mEq/L 29  Glucose 70 - 99 mg/dL 94  BUN 6 - 23 mg/dL 22  Creatinine 0.40 - 1.50 mg/dL 1.15  Calcium 8.4 - 10.5 mg/dL 9.5  GFR >60.00 mL/min 67.32    Latest Reference Range & Units 02/18/22 08:51  VITD 30.00 - 100.00 ng/mL 47.02     ASSESSMENT / PLAN / RECOMMENDATIONS:    Primary Hyperparathyroidism:   -Patient is s/p left superior and inferior parathyroidectomy August 2023 -Serum calcium and vitamin D remain normal -Patient may continue vitamin D and MVI -No need for any further endocrinology follow-up at this time    Signed electronically by: Mack Guise, MD  [facility]  [facility] [address] [place], [place] [address] Phone: [phone] FAX: [phone]      CC: [name], [address] [place] [address] Phone: [phone]  Fax: [phone]   Return to Endocrinology clinic as below: Future Appointments  Date Time Provider [place]  04/05/2022  1:30 PM Baldwin Jamaica, PA-C [facility]  04/29/2022  7:00 AM [facility] DEVICE REMOTES [facility]  05/19/2022  2:20 PM [name], MD [facility]  07/29/2022  7:00 AM [facility] DEVICE REMOTES [facility]  10/28/2022  7:00 AM [facility] DEVICE REMOTES [facility]

## 2022-02-19 LAB — PARATHYROID HORMONE, INTACT (NO CA): PTH: 15 pg/mL — ABNORMAL LOW (ref 16–77)

## 2022-04-04 NOTE — Progress Notes (Signed)
Cardiology Office Note Date:  04/04/2022  Patient ID:  Patrick, Walter 03/23/1955, MRN [id number] PCP:  Monica Becton, MD  Cardiologist:  Dr. Excell Seltzer Electrophysiologist: Dr. Johney Frame    Chief Complaint:   annual visit  History of Present Illness: Patrick Walter is a 67 y.o. male with history of VHD, PFO, aortic aneurysm s/p AVR/PFO closure and aneurysm repair  (bioprosthetic AVR and PFO closure 2015 > staph endocarditis > redo AVR and repair of ascending aortic aneurysm 2019), HTN, advanced heart block w/PPM   He saw Dr. Johney Frame Feb 2020.  At that time was doing very well, exercising regularly without complaints He had regained some Av conduction and VIP was turned on  I saw him Oct 2022 Outside of his hip pain he feels really well. Despite his hip, he walks miles avery day without any CP, palpitations or cardiac awareness Denies any SOB, DOE. No difficulties with his ADLs No dizzy spells, near syncope or syncope. He has gotten lost to follow up here 2/2 COVID restrictions last year or 2 RCRI score is zero, 0.4% DUKE: 8.97METS Felt to be a reasonable surgical candidate No changes were made.  He saw Dr. Excell Seltzer 05/18/21, feeling great, coaching soccer, good medication compliance.  Pt reported some concerns of his URL being 155 given his level of exercise, and advised to follow up with EP.  BP unusually elevated with better control reported at home.  11/06/21 underwent Neck exploration, left superior and inferior parathyroidectomy 2/2 primary hyperparathyroidism, hypercalcemia  Last remote with some noise noted, reviewed by Dr. Johney Frame, noted on both A and V lead (V lead noise not seen/noted by the device), and suspect external.  TODAY Continues to feel/do very well. Remains very active, exercising regularly, back to the [facility]. No CP, palpitations, SOB, no exertional intolerances No near syncope or syncope.   Device information SJM dual chamber PPM implanted  01/31/2018 for post-op CHB   Past Medical History:  Diagnosis Date   Aortic stenosis    Arthritis    Ascending aorta enlargement (HCC)    Bicuspid aortic valve    Blood transfusion without reported diagnosis    Heart murmur    Hypertension    Patent foramen ovale    Closed at the time of aortic valve replacement   Pilonidal cyst    Presence of permanent cardiac pacemaker    S/P aortic valve replacement with bioprosthetic valve 12/05/2013   25 mm [facility] Ease bovine pericardial tissue valve    S/P redo aortic valve replacement with human allograft aortic root graft 01/26/2018   Septic embolism (HCC)    Brain, spleen, superior mesenteric artery    Past Surgical History:  Procedure Laterality Date   AORTIC VALVE REPLACEMENT N/A 12/05/2013   Procedure: AORTIC VALVE REPLACEMENT (AVR);  Surgeon: Purcell Nails, MD;  [facility];  Service: Open Heart Surgery;  Laterality: N/A;   AORTIC VALVE REPLACEMENT N/A 01/26/2018   Procedure: REDO STERNOTOMY, REDO AORTIC VALVE REPLACEMENT WITH HUMAN ALLOGRAFT INCLUDING AORTIC ROOT REPLACEMENT,  REPAIR ASCENDING THORACIC AORTIC ANEURYSM;  Surgeon: Purcell Nails, MD;  Location[facility];  Service: Open Heart Surgery;  Laterality: N/A;   HAND SURGERY Left 07/1977   HIP ARTHROPLASTY Left 02/18/2021   INTRAOPERATIVE TRANSESOPHAGEAL ECHOCARDIOGRAM N/A 12/05/2013   Procedure: INTRAOPERATIVE TRANSESOPHAGEAL ECHOCARDIOGRAM;  Surgeon: Purcell Nails, MD;  Location: [facility] OR;  Service: Open Heart Surgery;  Laterality: N/A;   LEFT AND RIGHT HEART CATHETERIZATION WITH CORONARY ANGIOGRAM N/A 11/08/2013  Procedure: LEFT AND RIGHT HEART CATHETERIZATION WITH CORONARY ANGIOGRAM;  Surgeon: Micheline Chapman, MD;  Location: [facility] CATH LAB;  Service: Cardiovascular;  Laterality: N/A;   PACEMAKER IMPLANT N/A 01/31/2018   SJM Assurity MRI DR implanted by Dr Johney Frame for complete heart block   PARATHYROIDECTOMY Left 11/06/2021   Procedure: LEFT PARATHYROIDECTOMY;   Surgeon: Darnell Level, MD;  Location[facility];  Service: General;  Laterality: Left;   PATENT FORAMEN OVALE CLOSURE N/A 12/05/2013   Procedure: PATENT FORAMEN OVALE CLOSURE;  Surgeon: Purcell Nails, MD;  Location[facility];  Service: Open Heart Surgery;  Laterality: N/A;   PILONIDAL CYST / SINUS EXCISION  01/20/2018   Dr Luisa Hart   TEE WITHOUT CARDIOVERSION N/A 10/23/2013   Procedure: TRANSESOPHAGEAL ECHOCARDIOGRAM (TEE);  Surgeon: Laurey Morale, MD;  Location: [facility] ENDOSCOPY;  Service: Cardiovascular;  Laterality: N/A;   TEE WITHOUT CARDIOVERSION N/A 01/19/2018   Procedure: TRANSESOPHAGEAL ECHOCARDIOGRAM (TEE);  Surgeon: Thurmon Fair, MD;  Location: [facility] ENDOSCOPY;  Service: Cardiovascular;  Laterality: N/A;   TEE WITHOUT CARDIOVERSION N/A 01/26/2018   Procedure: TRANSESOPHAGEAL ECHOCARDIOGRAM (TEE);  Surgeon: Purcell Nails, MD;  Location: [facility] OR;  Service: Open Heart Surgery;  Laterality: N/A;   VASECTOMY  06/2002    Current Outpatient Medications  Medication Sig Dispense Refill   APPLE CIDER VINEGAR PO Take 1 tablet by mouth daily.     Ascorbic Acid (VITAMIN C) 1000 MG tablet Take 1,000 mg by mouth daily.     aspirin EC 81 MG tablet Take 81 mg by mouth daily.     bisacodyl (DULCOLAX) 5 MG EC tablet Take 5 mg by mouth daily as needed for moderate constipation.     Boswellia-Glucosamine-Vit D (GLUCOSAMINE COMPLEX PO) Take 1 capsule by mouth daily.     Cholecalciferol (VITAMIN D3 PO) Take 1 tablet by mouth daily.     Coenzyme Q10 300 MG CAPS Take 300 mg by mouth daily.     COLLAGEN PO Take 1 tablet by mouth daily.     Cyanocobalamin (B-12 PO) Take 1 tablet by mouth daily.     Omega 3 1000 MG CAPS Take 3,000 mg by mouth daily.     Probiotic Product (PROBIOTIC PO) Take 1 capsule by mouth daily.     traMADol (ULTRAM) 50 MG tablet Take 1-2 tablets (50-100 mg total) by mouth every 6 (six) hours as needed for moderate pain. 15 tablet 0   TURMERIC PO Take 1 capsule by mouth daily.     No  current facility-administered medications for this visit.    Allergies:   Patient has no known allergies.   Social History:  The patient  reports that he has never smoked. He has never used smokeless tobacco. He reports current alcohol use. He reports that he does not use drugs.   Family History:  The patient's family history includes Cancer in his daughter, father, maternal grandfather, maternal grandmother, mother, paternal grandfather, and paternal grandmother; Colon cancer (age of onset: 62) in his paternal grandmother; Colon cancer (age of onset: 18) in his father; Colon polyps in his father and paternal grandmother; Diabetes in his father, maternal grandfather, maternal grandmother, mother, paternal grandfather, and paternal grandmother; Heart attack in his maternal grandmother; Hypertension in his maternal grandmother; Stroke in his paternal grandfather.  ROS:  Please see the history of present illness.    All other systems are reviewed and otherwise negative.   PHYSICAL EXAM:  VS:  There were no vitals taken for this visit. BMI: There  is no height or weight on file to calculate BMI. Well nourished, well developed, in no acute distress HEENT: normocephalic, atraumatic Neck: no JVD, carotid bruits or masses Cardiac:  RRR; no significant murmurs are appreciated, no rubs, or gallops Lungs:  CTA b/l, no wheezing, rhonchi or rales Abd: soft, nontender MS: no deformity or atrophy Ext: o edema Skin: warm and dry, no rash Neuro:  No gross deficits appreciated Psych: euthymic mood, full affect  PPM site is stable, no tethering or discomfort   EKG:  done today and reviewed personally: SR, V paced   Device interrogation: Battery and lead measurements are good  No R waves AMS are old/all the same day in August and previously seen as discussed above. None since No recurrent noise none noted today No HVR episodes No AF   05/23/2018 TTE IMPRESSIONS   1. The left ventricle has low  normal systolic function, with an ejection  fraction of 50-55%. The cavity size was mildly dilated. Left ventricular  diastolic parameters were normal There is abnormal septal motion  consistent with RV pacemaker.   2. The right ventricle has mildly reduced systolic function. The cavity  was mildly enlarged. There is no increase in right ventricular wall  thickness. Right ventricular systolic pressure is normal with an estimated  pressure of 25.8 mmHg.   3. Left atrial size was mildly dilated.   4. Right atrial size was mildly dilated.   5. The mitral valve is normal in structure. Mild thickening of the mitral  valve leaflet. There is mild mitral annular calcification present. Mitral  valve regurgitation is mild to moderate by color flow Doppler. The MR jet  is eccentric medially directed.   6. The tricuspid valve is normal in structure.   7. A Human allograft valve is present in the aortic position (including  aortic root replacement, repair ascending thoracic aortic aneurysm;  Procedure Date: 01/26/2018).   8. Normal aortic valve prosthesis. Peak and mean gradients are 12 and 7  mm Hg, respectively.   9. Aortic valve regurgitation is trivial by color flow Doppler.  10. The pulmonic valve was normal in structure.  11. The aortic root and ascending aorta are normal in size and structure.     Recent Labs: 10/29/2021: Hemoglobin 14.1; Platelets 208 02/18/2022: BUN 22; Creatinine, Ser 1.15; Potassium 4.2; Sodium 141  No results found for requested labs within last 365 days.   CrCl cannot be calculated (Patient's most recent lab result is older than the maximum 21 days allowed.).   Wt Readings from Last 3 Encounters:  02/18/22 204 lb (92.5 kg)  11/06/21 203 lb 0.7 oz (92.1 kg)  10/29/21 203 lb (92.1 kg)     Other studies reviewed: Additional studies/records reviewed today include: summarized above  ASSESSMENT AND PLAN:  PPM Intact function no programming changes  made Histograms reviewed, rarely approaching max tracking  Discussed avoiding excessive/repetitive UE lifting  VHD S/p AVR/root with re-do operation in 2019 2/2 endocarditis.   Also has had PFO closure and thoracic ascending aortic aneurysm repair No symptoms or exam findings to suggest any clinical changes RV paces >99% Will defer timing of surveillance echos to Dr. Excell Seltzer C/w dr. Sondra Barges   3. HTN He reports at a recent MD visit was 130 /80 A recheck by me is 140/80 He thinks is a combination of white coat syndrome/some ETOH and a cigar last night. I asked him to keep an eye on his BP at home for [place] Revisit next month  when he sees Dr. Excell Seltzer    Discussed need for new EP MD, will plan to transition to Dr. Nelly Laurence    Disposition: remotes as usual, in clinic n a year, sooner if needed   Current medicines are reviewed at length with the patient today.  The patient did not have any concerns regarding medicines.  Patrick Fredrickson, PA-C 04/04/2022 10:38 AM     [facility] [address] [place] [address] [phone] (office)  [phone] (fax)

## 2022-04-05 ENCOUNTER — Encounter: Payer: Self-pay | Admitting: Physician Assistant

## 2022-04-05 ENCOUNTER — Ambulatory Visit: Payer: Commercial Managed Care - PPO | Attending: Physician Assistant | Admitting: Physician Assistant

## 2022-04-05 VITALS — BP 170/70 | HR 80 | Ht 71.0 in | Wt 205.8 lb

## 2022-04-05 DIAGNOSIS — I442 Atrioventricular block, complete: Secondary | ICD-10-CM

## 2022-04-05 DIAGNOSIS — Z952 Presence of prosthetic heart valve: Secondary | ICD-10-CM | POA: Diagnosis not present

## 2022-04-05 DIAGNOSIS — Z950 Presence of cardiac pacemaker: Secondary | ICD-10-CM | POA: Diagnosis not present

## 2022-04-05 DIAGNOSIS — I10 Essential (primary) hypertension: Secondary | ICD-10-CM | POA: Diagnosis not present

## 2022-04-05 LAB — CUP PACEART INCLINIC DEVICE CHECK
Battery Remaining Longevity: 76 mo
Battery Voltage: 2.99 V
Brady Statistic RA Percent Paced: 30 %
Brady Statistic RV Percent Paced: 99.43 %
Date Time Interrogation Session: 20240115151451
Implantable Lead Connection Status: 753985
Implantable Lead Connection Status: 753985
Implantable Lead Implant Date: 20191112
Implantable Lead Implant Date: 20191112
Implantable Lead Location: 753859
Implantable Lead Location: 753860
Implantable Pulse Generator Implant Date: 20191112
Lead Channel Impedance Value: 375 Ohm
Lead Channel Impedance Value: 562.5 Ohm
Lead Channel Pacing Threshold Amplitude: 0.5 V
Lead Channel Pacing Threshold Amplitude: 0.625 V
Lead Channel Pacing Threshold Pulse Width: 0.5 ms
Lead Channel Pacing Threshold Pulse Width: 0.5 ms
Lead Channel Sensing Intrinsic Amplitude: 10.5 mV
Lead Channel Sensing Intrinsic Amplitude: 3.6 mV
Lead Channel Setting Pacing Amplitude: 0.875
Lead Channel Setting Pacing Amplitude: 1.5 V
Lead Channel Setting Pacing Pulse Width: 0.5 ms
Lead Channel Setting Sensing Sensitivity: 3.5 mV
Pulse Gen Model: 2272
Pulse Gen Serial Number: 9086757

## 2022-04-05 NOTE — Patient Instructions (Signed)
Medication Instructions:   Your physician recommends that you continue on your current medications as directed. Please refer to the Current Medication list given to you today.   *If you need a refill on your cardiac medications before your next appointment, please call your pharmacy*   Lab Work: NONE ORDERED  TODAY   If you have labs (blood work) drawn today and your tests are completely normal, you will receive your results only by: MyChart Message (if you have MyChart) OR A paper copy in the mail If you have any lab test that is abnormal or we need to change your treatment, we will call you to review the results.   Testing/Procedures: NONE ORDERED  TODAY    Follow-Up: At [facility], you and your health needs are our priority.  As part of our continuing mission to provide you with exceptional heart care, we have created designated Provider Care Teams.  These Care Teams include your primary Cardiologist (physician) and Advanced Practice Providers (APPs -  Physician Assistants and Nurse Practitioners) who all work together to provide you with the care you need, when you need it.  We recommend signing up for the patient portal called "MyChart".  Sign up information is provided on this After Visit Summary.  MyChart is used to connect with patients for Virtual Visits (Telemedicine).  Patients are able to view lab/test results, encounter notes, upcoming appointments, etc.  Non-urgent messages can be sent to your provider as well.   To learn more about what you can do with MyChart, go to [email or web address].    Your next appointment:   1 year(s)  Provider:   Augustus Mealor, MD    Other Instructions  

## 2022-04-20 DIAGNOSIS — R102 Pelvic and perineal pain: Secondary | ICD-10-CM | POA: Diagnosis not present

## 2022-04-20 DIAGNOSIS — M1612 Unilateral primary osteoarthritis, left hip: Secondary | ICD-10-CM | POA: Diagnosis not present

## 2022-04-29 ENCOUNTER — Ambulatory Visit (INDEPENDENT_AMBULATORY_CARE_PROVIDER_SITE_OTHER): Payer: 59

## 2022-04-29 DIAGNOSIS — I442 Atrioventricular block, complete: Secondary | ICD-10-CM

## 2022-04-30 LAB — CUP PACEART REMOTE DEVICE CHECK
Battery Remaining Longevity: 76 mo
Battery Remaining Percentage: 61 %
Battery Voltage: 2.99 V
Brady Statistic AP VP Percent: 33 %
Brady Statistic AP VS Percent: 1 %
Brady Statistic AS VP Percent: 67 %
Brady Statistic AS VS Percent: 1 %
Brady Statistic RA Percent Paced: 32 %
Brady Statistic RV Percent Paced: 99 %
Date Time Interrogation Session: 20240209023348
Implantable Lead Connection Status: 753985
Implantable Lead Connection Status: 753985
Implantable Lead Implant Date: 20191112
Implantable Lead Implant Date: 20191112
Implantable Lead Location: 753859
Implantable Lead Location: 753860
Implantable Pulse Generator Implant Date: 20191112
Lead Channel Impedance Value: 380 Ohm
Lead Channel Impedance Value: 560 Ohm
Lead Channel Pacing Threshold Amplitude: 0.5 V
Lead Channel Pacing Threshold Amplitude: 0.625 V
Lead Channel Pacing Threshold Pulse Width: 0.5 ms
Lead Channel Pacing Threshold Pulse Width: 0.5 ms
Lead Channel Sensing Intrinsic Amplitude: 10.5 mV
Lead Channel Sensing Intrinsic Amplitude: 3.2 mV
Lead Channel Setting Pacing Amplitude: 0.875
Lead Channel Setting Pacing Amplitude: 1.5 V
Lead Channel Setting Pacing Pulse Width: 0.5 ms
Lead Channel Setting Sensing Sensitivity: 3.5 mV
Pulse Gen Model: 2272
Pulse Gen Serial Number: 9086757

## 2022-05-19 ENCOUNTER — Ambulatory Visit: Payer: Commercial Managed Care - PPO | Attending: Cardiovascular Disease | Admitting: Cardiovascular Disease

## 2022-05-19 ENCOUNTER — Encounter: Payer: Self-pay | Admitting: Cardiovascular Disease

## 2022-05-19 VITALS — BP 144/90 | HR 83 | Ht 71.0 in | Wt 200.0 lb

## 2022-05-19 DIAGNOSIS — I442 Atrioventricular block, complete: Secondary | ICD-10-CM | POA: Diagnosis not present

## 2022-05-19 DIAGNOSIS — Z952 Presence of prosthetic heart valve: Secondary | ICD-10-CM

## 2022-05-19 DIAGNOSIS — I10 Essential (primary) hypertension: Secondary | ICD-10-CM

## 2022-05-19 MED ORDER — AMLODIPINE BESYLATE 5 MG PO TABS: 5.0000 mg | ORAL_TABLET | Freq: Every day | ORAL | 3 refills | Status: DC

## 2022-05-19 NOTE — Patient Instructions (Signed)
Medication Instructions:  START Amlodipine '5mg'$  daily *If you need a refill on your cardiac medications before your next appointment, please call your pharmacy*  Lab Work: NONE If you have labs (blood work) drawn today and your tests are completely normal, you will receive your results only by: [place] (if you have MyChart) OR A paper copy in the mail If you have any lab test that is abnormal or we need to change your treatment, we will call you to review the results.  Testing/Procedures: ECHO Your physician has requested that you have an echocardiogram. Echocardiography is a painless test that uses sound waves to create images of your heart. It provides your doctor with information about the size and shape of your heart and how well your heart's chambers and valves are working. This procedure takes approximately one hour. There are no restrictions for this procedure. Please do NOT wear cologne, perfume, aftershave, or lotions (deodorant is allowed). Please arrive 15 minutes prior to your appointment time.  Follow-Up: At [facility], you and your health needs are our priority.  As part of our continuing mission to provide you with exceptional heart care, we have created designated Provider Care Teams.  These Care Teams include your primary Cardiologist (physician) and Advanced Practice Providers (APPs -  Physician Assistants and Nurse Practitioners) who all work together to provide you with the care you need, when you need it.  Your next appointment:   1 year(s)  Provider:   [name], MD

## 2022-05-19 NOTE — Progress Notes (Unsigned)
Cardiology Office Note:    Date:  05/19/2022   ID:  Patrick Walter, DOB 1955-12-27, MRN [id number]  PCP:  [name], MD   [facility] Prov[facility] Cardiologist:  [name], MD     Referring MD: [name],*   Chief Complaint  Patient presents with   Follow-up    History of Present Illness:    Patrick Walter is a 67 y.o. male with a hx of bioprosthetic aortic valve replacement in 2015 without immediate complication. He required redo aortic valve replacement for prosthetic valve endocarditis in 2019 with postoperative course complicated by complete heart block requiring permanent pacemaker placement. He presents today for follow-up evaluation.  The patient is here alone today.  He is doing well and has no chest pain, chest pressure, or heart palpitations.  He remains active in his work and continues to coach girls soccer at [employer].  No edema, lightheadedness, orthopnea, or PND.  Past Medical History:  Diagnosis Date   Aortic stenosis    Arthritis    Ascending aorta enlargement (HCC)    Bicuspid aortic valve    Blood transfusion without reported diagnosis    Heart murmur    Hypertension    Patent foramen ovale    Closed at the time of aortic valve replacement   Pilonidal cyst    Presence of permanent cardiac pacemaker    S/P aortic valve replacement with bioprosthetic valve 12/05/2013   25 mm [facility] Ease bovine pericardial tissue valve    S/P redo aortic valve replacement with human allograft aortic root graft 01/26/2018   Septic embolism (HCC)    Brain, spleen, superior mesenteric artery    Past Surgical History:  Procedure Laterality Date   AORTIC VALVE REPLACEMENT N/A 12/05/2013   Procedure: AORTIC VALVE REPLACEMENT (AVR);  Surgeon: [name], MD;  Location: [place];  Service: Open Heart Surgery;  Laterality: N/A;   AORTIC VALVE REPLACEMENT N/A 01/26/2018   Procedure: REDO STERNOTOMY, REDO AORTIC  VALVE REPLACEMENT WITH HUMAN ALLOGRAFT INCLUDING AORTIC ROOT REPLACEMENT,  REPAIR ASCENDING THORACIC AORTIC ANEURYSM;  Surgeon: [name], MD;  Location: [place];  Service: Open Heart Surgery;  Laterality: N/A;   HAND SURGERY Left 07/1977   HIP ARTHROPLASTY Left 02/18/2021   INTRAOPERATIVE TRANSESOPHAGEAL ECHOCARDIOGRAM N/A 12/05/2013   Procedure: INTRAOPERATIVE TRANSESOPHAGEAL ECHOCARDIOGRAM;  Surgeon: [name], MD;  Location: [place];  Service: Open Heart Surgery;  Laterality: N/A;   LEFT AND RIGHT HEART CATHETERIZATION WITH CORONARY ANGIOGRAM N/A 11/08/2013   Procedure: LEFT AND RIGHT HEART CATHETERIZATION WITH CORONARY ANGIOGRAM;  Surgeon: Blane Ohara, MD;  Location: [facility] CATH LAB;  Service: Cardiovascular;  Laterality: N/A;   PACEMAKER IMPLANT N/A 01/31/2018   SJM Assurity MRI DR implanted by Dr Rayann Heman for complete heart block   PARATHYROIDECTOMY Left 11/06/2021   Procedure: LEFT PARATHYROIDECTOMY;  Surgeon: Armandina Gemma, MD;  Location: [facility];  Service: General;  Laterality: Left;   PATENT FORAMEN OVALE CLOSURE N/A 12/05/2013   Procedure: PATENT FORAMEN OVALE CLOSURE;  Surgeon: [name], MD;  Location: [place];  Service: Open Heart Surgery;  Laterality: N/A;   PILONIDAL CYST / SINUS EXCISION  01/20/2018   Dr Brantley Stage   TEE WITHOUT CARDIOVERSION N/A 10/23/2013   Procedure: TRANSESOPHAGEAL ECHOCARDIOGRAM (TEE);  Surgeon: Larey Dresser, MD;  Location: [place];  Service: Cardiovascular;  Laterality: N/A;   TEE WITHOUT CARDIOVERSION N/A 01/19/2018   Procedure: TRANSESOPHAGEAL ECHOCARDIOGRAM (TEE);  Surgeon: Sanda Klein, MD;  [facility];  Service: Cardiovascular;  Laterality: N/A;   TEE WITHOUT CARDIOVERSION N/A 01/26/2018   Procedure: TRANSESOPHAGEAL ECHOCARDIOGRAM (TEE);  Surgeon: [name], MD;  Location: [place];  Service: Open Heart Surgery;  Laterality: N/A;   VASECTOMY  06/2002    Current Medications: Current Meds  Medication Sig    amLODipine (NORVASC) 5 MG tablet Take 1 tablet (5 mg total) by mouth daily.   APPLE CIDER VINEGAR PO Take 1 tablet by mouth daily.   Ascorbic Acid (VITAMIN C) 1000 MG tablet Take 1,000 mg by mouth daily.   aspirin EC 81 MG tablet Take 81 mg by mouth daily.   Boswellia-Glucosamine-Vit D (GLUCOSAMINE COMPLEX PO) Take 1 capsule by mouth daily.   Cholecalciferol (VITAMIN D3 PO) Take 1 tablet by mouth daily.   Coenzyme Q10 300 MG CAPS Take 300 mg by mouth daily.   Cyanocobalamin (B-12 PO) Take 1 tablet by mouth daily.   Omega 3 1000 MG CAPS Take 3,000 mg by mouth daily.   Probiotic Product (PROBIOTIC PO) Take 1 capsule by mouth daily.   TURMERIC PO Take 1 capsule by mouth daily.     Allergies:   Patient has no known allergies.   Social History   Socioeconomic History   Marital status: Married    Spouse name: Not on file   Number of children: 5   Years of education: Not on file   Highest education level: Not on file  Occupational History   Occupation: Art gallery manager    Employer: [employer]  Tobacco Use   Smoking status: Never   Smokeless tobacco: Never  Vaping Use   Vaping Use: Never used  Substance and Sexual Activity   Alcohol use: Yes    Alcohol/week: 0.0 standard drinks of alcohol    Comment: occ   Drug use: No   Sexual activity: Not on file  Other Topics Concern   Not on file  Social History Narrative   The patient is married. He is originally from [place]. He works as an Art gallery manager. He does not smoke cigarettes or drink alcohol.   Social Determinants of Health   Financial Resource Strain: Not on file  Food Insecurity: Not on file  Transportation Needs: Not on file  Physical Activity: Not on file  Stress: Not on file  Social Connections: Not on file     Family History: The patient's family history includes Cancer in his daughter, father, maternal grandfather, maternal grandmother, mother, paternal grandfather, and paternal grandmother; Colon cancer  (age of onset: 34) in his paternal grandmother; Colon cancer (age of onset: 61) in his father; Colon polyps in his father and paternal grandmother; Diabetes in his father, maternal grandfather, maternal grandmother, mother, paternal grandfather, and paternal grandmother; Heart attack in his maternal grandmother; Hypertension in his maternal grandmother; Stroke in his paternal grandfather. There is no history of Esophageal cancer or Stomach cancer.  ROS:   Please see the history of present illness.    All other systems reviewed and are negative.  EKGs/Labs/Other Studies Reviewed:     EKG:  EKG is not ordered today.    Recent Labs: 10/29/2021: Hemoglobin 14.1; Platelets 208 02/18/2022: BUN 22; Creatinine, Ser 1.15; Potassium 4.2; Sodium 141  Recent Lipid Panel    Component Value Date/Time   CHOL 200 (H) 01/06/2021 0000   TRIG 74 01/06/2021 0000   HDL 54 01/06/2021 0000   CHOLHDL 3.7 01/06/2021 0000   VLDL 13 04/21/2015 0912   LDLCALC 129 (  H) 01/06/2021 0000     Risk Assessment/Calculations:      HYPERTENSION CONTROL Vitals:   05/19/22 1459 05/19/22 1534  BP: (!) 136/100 (!) 144/90    The patient's blood pressure is elevated above target today. {Click here if intervention needs to be changed Refresh Note :1}  In order to address the patient's elevated BP: A new medication was prescribed today.            Physical Exam:    VS:  BP (!) 144/90   Pulse 83   Ht '5\' 11"'$  (1.803 m)   Wt 200 lb (90.7 kg) Comment: Per patient. refused to weigh  SpO2 98%   BMI 27.89 kg/m     Wt Readings from Last 3 Encounters:  05/19/22 200 lb (90.7 kg)  04/05/22 205 lb 12.8 oz (93.4 kg)  02/18/22 204 lb (92.5 kg)     GEN:  Well nourished, well developed in no acute distress HEENT: Normal NECK: No JVD; No carotid bruits LYMPHATICS: No lymphadenopathy CARDIAC: RRR, 2/6 ejection murmur at the right upper sternal border RESPIRATORY:  Clear to auscultation without rales, wheezing or  rhonchi  ABDOMEN: Soft, non-tender, non-distended MUSCULOSKELETAL:  No edema; No deformity  SKIN: Warm and dry NEUROLOGIC:  Alert and oriented x 3 PSYCHIATRIC:  Normal affect   ASSESSMENT:    1. S/P AVR   2. Primary hypertension   3. AV block, 3rd degree (HCC)    PLAN:    In order of problems listed above:  Patient's last echo from 2020 is reviewed and this demonstrated normal function of his aortic bioprosthesis.  Recommend an updated echocardiogram as it has been 4 years since his last study.  Patient with NYHA functional class I symptoms at present.  No functional limitation.  SBE prophylaxis indicated lifelong. See blood pressure recheck above.  Patient states that most of his blood pressures are ranging around 130/85-90 mmHg.  Recommended amlodipine 5 mg daily.  Discussed potential side effects.  Lifestyle modification discussed. Patient is followed regularly through the device clinic.  Overall appears stable.           Medication Adjustments/Labs and Tests Ordered: Current medicines are reviewed at length with the patient today.  Concerns regarding medicines are outlined above.  Orders Placed This Encounter  Procedures   ECHOCARDIOGRAM COMPLETE   Meds ordered this encounter  Medications   amLODipine (NORVASC) 5 MG tablet    Sig: Take 1 tablet (5 mg total) by mouth daily.    Dispense:  90 tablet    Refill:  3    Patient Instructions  Medication Instructions:  START Amlodipine '5mg'$  daily *If you need a refill on your cardiac medications before your next appointment, please call your pharmacy*  Lab Work: NONE If you have labs (blood work) drawn today and your tests are completely normal, you will receive your results only by: [place] (if you have MyChart) OR A paper copy in the mail If you have any lab test that is abnormal or we need to change your treatment, we will call you to review the results.  Testing/Procedures: ECHO Your physician has requested  that you have an echocardiogram. Echocardiography is a painless test that uses sound waves to create images of your heart. It provides your doctor with information about the size and shape of your heart and how well your heart's chambers and valves are working. This procedure takes approximately one hour. There are no restrictions for this procedure. Please do NOT  wear cologne, perfume, aftershave, or lotions (deodorant is allowed). Please arrive 15 minutes prior to your appointment time.  Follow-Up: At [facility], you and your health needs are our priority.  As part of our continuing mission to provide you with exceptional heart care, we have created designated Provider Care Teams.  These Care Teams include your primary Cardiologist (physician) and Advanced Practice Providers (APPs -  Physician Assistants and Nurse Practitioners) who all work together to provide you with the care you need, when you need it.  Your next appointment:   1 year(s)  Provider:   [name], MD        Signed, [name], MD  05/19/2022 3:34 PM    [place]

## 2022-05-21 NOTE — Progress Notes (Signed)
Remote pacemaker transmission.   

## 2022-06-21 ENCOUNTER — Ambulatory Visit (HOSPITAL_COMMUNITY): Payer: Commercial Managed Care - PPO

## 2022-07-14 ENCOUNTER — Ambulatory Visit (HOSPITAL_COMMUNITY): Payer: Commercial Managed Care - PPO | Attending: Cardiology

## 2022-07-14 DIAGNOSIS — I081 Rheumatic disorders of both mitral and tricuspid valves: Secondary | ICD-10-CM

## 2022-07-14 DIAGNOSIS — Z952 Presence of prosthetic heart valve: Secondary | ICD-10-CM

## 2022-07-14 DIAGNOSIS — I517 Cardiomegaly: Secondary | ICD-10-CM

## 2022-07-14 DIAGNOSIS — I5030 Unspecified diastolic (congestive) heart failure: Secondary | ICD-10-CM

## 2022-07-14 DIAGNOSIS — I10 Essential (primary) hypertension: Secondary | ICD-10-CM

## 2022-07-14 DIAGNOSIS — I442 Atrioventricular block, complete: Secondary | ICD-10-CM | POA: Diagnosis not present

## 2022-07-14 LAB — ECHOCARDIOGRAM COMPLETE
AR max vel: 2.31 cm2
AV Area VTI: 2.33 cm2
AV Area mean vel: 2.25 cm2
AV Mean grad: 6 mmHg
AV Peak grad: 11.5 mmHg
Ao pk vel: 1.7 m/s
Area-P 1/2: 2.91 cm2
Calc EF: 47.5 %
P 1/2 time: 459 msec
Radius: 0.6 cm
S' Lateral: 5.2 cm
Single Plane A2C EF: 48.8 %
Single Plane A4C EF: 47.3 %

## 2022-07-22 ENCOUNTER — Ambulatory Visit (INDEPENDENT_AMBULATORY_CARE_PROVIDER_SITE_OTHER): Payer: Commercial Managed Care - PPO

## 2022-07-22 ENCOUNTER — Encounter: Payer: Self-pay | Admitting: Sports Medicine

## 2022-07-22 ENCOUNTER — Ambulatory Visit (INDEPENDENT_AMBULATORY_CARE_PROVIDER_SITE_OTHER): Payer: Commercial Managed Care - PPO | Admitting: Sports Medicine

## 2022-07-22 VITALS — BP 128/79 | HR 80 | Ht 71.0 in | Wt 206.0 lb

## 2022-07-22 DIAGNOSIS — M5136 Other intervertebral disc degeneration, lumbar region: Secondary | ICD-10-CM | POA: Insufficient documentation

## 2022-07-22 DIAGNOSIS — E782 Mixed hyperlipidemia: Secondary | ICD-10-CM

## 2022-07-22 DIAGNOSIS — M545 Low back pain, unspecified: Secondary | ICD-10-CM

## 2022-07-22 DIAGNOSIS — Z0000 Encounter for general adult medical examination without abnormal findings: Secondary | ICD-10-CM

## 2022-07-22 DIAGNOSIS — N200 Calculus of kidney: Secondary | ICD-10-CM

## 2022-07-22 DIAGNOSIS — M51369 Other intervertebral disc degeneration, lumbar region without mention of lumbar back pain or lower extremity pain: Secondary | ICD-10-CM | POA: Insufficient documentation

## 2022-07-22 NOTE — Addendum Note (Signed)
Addended by: Carren Rang A on: 07/22/2022 09:44 AM   Modules accepted: Orders

## 2022-07-22 NOTE — Assessment & Plan Note (Signed)
History of hyperparathyroidism, known nephrolithiasis with a 7 mm left-sided intrarenal stone. We are watching this for now. He is having some back pain but this is more low back worse with sitting so I suspect it is discogenic rather than coming from the stone. I would like him to go ahead and get established with a urologist however.

## 2022-07-22 NOTE — Progress Notes (Signed)
Subjective:    CC: Annual Physical Exam  HPI:  This patient is here for their annual physical  I reviewed the past medical history, family history, social history, surgical history, and allergies today and no changes were needed.  Please see the problem list section below in epic for further details.  Past Medical History: Past Medical History:  Diagnosis Date   Aortic stenosis    Arthritis    Ascending aorta enlargement (HCC)    Bicuspid aortic valve    Blood transfusion without reported diagnosis    Heart murmur    Hypertension    Patent foramen ovale    Closed at the time of aortic valve replacement   Pilonidal cyst    Presence of permanent cardiac pacemaker    S/P aortic valve replacement with bioprosthetic valve 12/05/2013   25 mm [facility] Ease bovine pericardial tissue valve    S/P redo aortic valve replacement with human allograft aortic root graft 01/26/2018   Septic embolism (HCC)    Brain, spleen, superior mesenteric artery   Past Surgical History: Past Surgical History:  Procedure Laterality Date   AORTIC VALVE REPLACEMENT N/A 12/05/2013   Procedure: AORTIC VALVE REPLACEMENT (AVR);  Surgeon: Purcell Nails, MD;  Location: [facility] OR;  Service: Open Heart Surgery;  Laterality: N/A;   AORTIC VALVE REPLACEMENT N/A 01/26/2018   Procedure: REDO STERNOTOMY, REDO AORTIC VALVE REPLACEMENT WITH HUMAN ALLOGRAFT INCLUDING AORTIC ROOT REPLACEMENT,  REPAIR ASCENDING THORACIC AORTIC ANEURYSM;  Surgeon: Purcell Nails, MD;  Location: [facility] OR;  Service: Open Heart Surgery;  Laterality: N/A;   HAND SURGERY Left 07/1977   HIP ARTHROPLASTY Left 02/18/2021   INTRAOPERATIVE TRANSESOPHAGEAL ECHOCARDIOGRAM N/A 12/05/2013   Procedure: INTRAOPERATIVE TRANSESOPHAGEAL ECHOCARDIOGRAM;  Surgeon: Purcell Nails, MD;  Location: [facility] OR;  Service: Open Heart Surgery;  Laterality: N/A;   LEFT AND RIGHT HEART CATHETERIZATION WITH CORONARY ANGIOGRAM N/A 11/08/2013   Procedure: LEFT AND RIGHT HEART  CATHETERIZATION WITH CORONARY ANGIOGRAM;  Surgeon: Micheline Chapman, MD;  Location: [facility] CATH LAB;  Service: Cardiovascular;  Laterality: N/A;   PACEMAKER IMPLANT N/A 01/31/2018   SJM Assurity MRI DR implanted by Dr Johney Frame for complete heart block   PARATHYROIDECTOMY Left 11/06/2021   Procedure: LEFT PARATHYROIDECTOMY;  Surgeon: Darnell Level, MD;  Location: [facility];  Service: General;  Laterality: Left;   PATENT FORAMEN OVALE CLOSURE N/A 12/05/2013   Procedure: PATENT FORAMEN OVALE CLOSURE;  Surgeon: Purcell Nails, MD;  [facility];  Service: Open Heart Surgery;  Laterality: N/A;   PILONIDAL CYST / SINUS EXCISION  01/20/2018   Dr Luisa Hart   TEE WITHOUT CARDIOVERSION N/A 10/23/2013   Procedure: TRANSESOPHAGEAL ECHOCARDIOGRAM (TEE);  Surgeon: Laurey Morale, MD;  Location: [facility] ENDOSCOPY;  Service: Cardiovascular;  Laterality: N/A;   TEE WITHOUT CARDIOVERSION N/A 01/19/2018   Procedure: TRANSESOPHAGEAL ECHOCARDIOGRAM (TEE);  Surgeon: Thurmon Fair, MD;  Location: [facility] ENDOSCOPY;  Service: Cardiovascular;  Laterality: N/A;   TEE WITHOUT CARDIOVERSION N/A 01/26/2018   Procedure: TRANSESOPHAGEAL ECHOCARDIOGRAM (TEE);  Surgeon: Purcell Nails, MD;  Location: [facility] OR;  Service: Open Heart Surgery;  Laterality: N/A;   VASECTOMY  06/2002   Social History: Social History   Socioeconomic History   Marital status: Married    Spouse name: Not on file   Number of children: 5   Years of education: Not on file   Highest education level: Not on file  Occupational History   Occupation: Acupuncturist    Employer: [employer]  Tobacco  Use   Smoking status: Never   Smokeless tobacco: Never  Vaping Use   Vaping Use: Never used  Substance and Sexual Activity   Alcohol use: Yes    Alcohol/week: 0.0 standard drinks of alcohol    Comment: occ   Drug use: No   Sexual activity: Not on file  Other Topics Concern   Not on file  Social History Narrative   The patient is married. He is originally  from [place]. He works as an Acupuncturist. He does not smoke cigarettes or drink alcohol.   Social Determinants of Health   Financial Resource Strain: Not on file  Food Insecurity: Not on file  Transportation Needs: Not on file  Physical Activity: Not on file  Stress: Not on file  Social Connections: Not on file   Family History: Family History  Problem Relation Age of Onset   Diabetes Mother    Cancer Mother        colon   Diabetes Father    Cancer Father        colon   Colon cancer Father 53   Colon polyps Father    Diabetes Paternal Grandmother    Cancer Paternal Grandmother    Colon cancer Paternal Grandmother 79   Colon polyps Paternal Grandmother    Cancer Daughter        leukemia   Heart attack Maternal Grandmother    Hypertension Maternal Grandmother    Diabetes Maternal Grandmother    Cancer Maternal Grandmother    Diabetes Maternal Grandfather    Cancer Maternal Grandfather    Stroke Paternal Grandfather    Diabetes Paternal Grandfather    Cancer Paternal Grandfather    Esophageal cancer Neg Hx    Stomach cancer Neg Hx    Allergies: No Known Allergies Medications: See med rec.  Review of Systems: No headache, visual changes, nausea, vomiting, diarrhea, constipation, dizziness, abdominal pain, skin rash, fevers, chills, night sweats, swollen lymph nodes, weight loss, chest pain, body aches, joint swelling, muscle aches, shortness of breath, mood changes, visual or auditory hallucinations.  Objective:    General: Well Developed, well nourished, and in no acute distress.  Neuro: Alert and oriented x3, extra-ocular muscles intact, sensation grossly intact. Cranial nerves II through XII are intact, motor, sensory, and coordinative functions are all intact. HEENT: Normocephalic, atraumatic, pupils equal round reactive to light, neck supple, no masses, no lymphadenopathy, thyroid nonpalpable. Oropharynx, nasopharynx, external ear canals are  unremarkable. Skin: Warm and dry, no rashes noted.  Cardiac: Regular rate and rhythm, no murmurs rubs or gallops.  Respiratory: Clear to auscultation bilaterally. Not using accessory muscles, speaking in full sentences.  Abdominal: Soft, nontender, nondistended, positive bowel sounds, no masses, no organomegaly.  Musculoskeletal: Shoulder, elbow, wrist, hip, knee, ankle stable, and with full range of motion.  Impression and Recommendations:    The patient was counselled, risk factors were discussed, anticipatory guidance given.  Annual physical exam Annual physical as above. Pneumococcal 20 given today. We discussed Shingrix, he will think about it.  Nephrolithiasis History of hyperparathyroidism, known nephrolithiasis with a 7 mm left-sided intrarenal stone. We are watching this for now. He is having some back pain but this is more low back worse with sitting so I suspect it is discogenic rather than coming from the stone. I would like him to go ahead and get established with a urologist however.  Lumbar degenerative disc disease Increasing pain left low back worse with sitting, flexion, Valsalva, adding lumbar spine  x-rays, home PT, return to see me in 6 weeks as needed for this.   ____________________________________________ Patrick Walter. Patrick Walter, M.D., ABFM., CAQSM., AME. Primary Care and Sports Medicine [facility]  Adjunct Professor of Family Medicine  [place] [employer]  Restaurant manager, fast food

## 2022-07-22 NOTE — Assessment & Plan Note (Signed)
Increasing pain left low back worse with sitting, flexion, Valsalva, adding lumbar spine x-rays, home PT, return to see me in 6 weeks as needed for this.

## 2022-07-22 NOTE — Assessment & Plan Note (Signed)
Annual physical as above. Pneumococcal 20 given today. We discussed Shingrix, he will think about it.

## 2022-07-23 ENCOUNTER — Encounter: Payer: Self-pay | Admitting: Sports Medicine

## 2022-07-23 LAB — LIPID PANEL
Cholesterol: 206 mg/dL — ABNORMAL HIGH (ref <200)
HDL: 68 mg/dL (ref >=40)
LDL Cholesterol (Calc): 123 mg/dL (calc) — ABNORMAL HIGH
Non-HDL Cholesterol (Calc): 138 mg/dL (calc) — ABNORMAL HIGH (ref <130)
Total CHOL/HDL Ratio: 3 (calc) (ref <5.0)
Triglycerides: 49 mg/dL (ref <150)

## 2022-07-23 LAB — COMPREHENSIVE METABOLIC PANEL
AG Ratio: 1.7 (calc) (ref 1.0–2.5)
ALT: 15 U/L (ref 9–46)
AST: 18 U/L (ref 10–35)
Albumin: 4.8 g/dL (ref 3.6–5.1)
Alkaline phosphatase (APISO): 53 U/L (ref 35–144)
BUN: 21 mg/dL (ref 7–25)
CO2: 26 mmol/L (ref 20–32)
Calcium: 9.5 mg/dL (ref 8.6–10.3)
Chloride: 107 mmol/L (ref 98–110)
Creat: 1.14 mg/dL (ref 0.70–1.35)
Globulin: 2.9 g/dL (calc) (ref 1.9–3.7)
Glucose, Bld: 99 mg/dL (ref 65–99)
Potassium: 4.4 mmol/L (ref 3.5–5.3)
Sodium: 143 mmol/L (ref 135–146)
Total Bilirubin: 0.6 mg/dL (ref 0.2–1.2)
Total Protein: 7.7 g/dL (ref 6.1–8.1)

## 2022-07-23 LAB — CBC
HCT: 45.1 % (ref 38.5–50.0)
Hemoglobin: 14.8 g/dL (ref 13.2–17.1)
MCH: 28.8 pg (ref 27.0–33.0)
MCHC: 32.8 g/dL (ref 32.0–36.0)
MCV: 87.7 fL (ref 80.0–100.0)
MPV: 11.2 fL (ref 7.5–12.5)
Platelets: 278 10*3/uL (ref 140–400)
RBC: 5.14 10*6/uL (ref 4.20–5.80)
RDW: 12.8 % (ref 11.0–15.0)
WBC: 5.5 10*3/uL (ref 3.8–10.8)

## 2022-07-23 LAB — PSA, TOTAL AND FREE
PSA, % Free: 20 % (calc) — ABNORMAL LOW (ref >25)
PSA, Free: 0.2 ng/mL
PSA, Total: 1 ng/mL (ref <=4.0)

## 2022-07-23 LAB — TSH: TSH: 2.67 mIU/L (ref 0.40–4.50)

## 2022-07-23 LAB — HEMOGLOBIN A1C
Hgb A1c MFr Bld: 5.5 % of total Hgb (ref <5.7)
Mean Plasma Glucose: 111 mg/dL
eAG (mmol/L): 6.2 mmol/L

## 2022-07-29 ENCOUNTER — Ambulatory Visit (INDEPENDENT_AMBULATORY_CARE_PROVIDER_SITE_OTHER): Payer: 59

## 2022-07-29 DIAGNOSIS — I442 Atrioventricular block, complete: Secondary | ICD-10-CM

## 2022-07-29 LAB — CUP PACEART REMOTE DEVICE CHECK
Battery Remaining Longevity: 72 mo
Battery Remaining Percentage: 59 %
Battery Voltage: 2.99 V
Brady Statistic AP VP Percent: 25 %
Brady Statistic AP VS Percent: 1 %
Brady Statistic AS VP Percent: 75 %
Brady Statistic AS VS Percent: 1 %
Brady Statistic RA Percent Paced: 24 %
Brady Statistic RV Percent Paced: 99 %
Date Time Interrogation Session: 20240509020015
Implantable Lead Connection Status: 753985
Implantable Lead Connection Status: 753985
Implantable Lead Implant Date: 20191112
Implantable Lead Implant Date: 20191112
Implantable Lead Location: 753859
Implantable Lead Location: 753860
Implantable Pulse Generator Implant Date: 20191112
Lead Channel Impedance Value: 380 Ohm
Lead Channel Impedance Value: 550 Ohm
Lead Channel Pacing Threshold Amplitude: 0.5 V
Lead Channel Pacing Threshold Amplitude: 0.625 V
Lead Channel Pacing Threshold Pulse Width: 0.5 ms
Lead Channel Pacing Threshold Pulse Width: 0.5 ms
Lead Channel Sensing Intrinsic Amplitude: 3.5 mV
Lead Channel Sensing Intrinsic Amplitude: 9.5 mV
Lead Channel Setting Pacing Amplitude: 0.875
Lead Channel Setting Pacing Amplitude: 1.5 V
Lead Channel Setting Pacing Pulse Width: 0.5 ms
Lead Channel Setting Sensing Sensitivity: 3.5 mV
Pulse Gen Model: 2272
Pulse Gen Serial Number: 9086757

## 2022-08-07 DIAGNOSIS — M25531 Pain in right wrist: Secondary | ICD-10-CM | POA: Diagnosis not present

## 2022-08-10 DIAGNOSIS — M67831 Other specified disorders of synovium, right wrist: Secondary | ICD-10-CM | POA: Diagnosis not present

## 2022-08-18 NOTE — Progress Notes (Signed)
Remote pacemaker transmission.   

## 2022-08-20 ENCOUNTER — Other Ambulatory Visit (HOSPITAL_COMMUNITY): Payer: Self-pay

## 2022-08-26 ENCOUNTER — Encounter: Payer: Self-pay | Admitting: Sports Medicine

## 2022-08-26 NOTE — Telephone Encounter (Signed)
See other MyChart message

## 2022-08-31 DIAGNOSIS — M67831 Other specified disorders of synovium, right wrist: Secondary | ICD-10-CM | POA: Diagnosis not present

## 2022-09-28 ENCOUNTER — Encounter (INDEPENDENT_AMBULATORY_CARE_PROVIDER_SITE_OTHER): Payer: Commercial Managed Care - PPO | Admitting: Sports Medicine

## 2022-09-28 DIAGNOSIS — M67831 Other specified disorders of synovium, right wrist: Secondary | ICD-10-CM | POA: Diagnosis not present

## 2022-09-28 DIAGNOSIS — M7989 Other specified soft tissue disorders: Secondary | ICD-10-CM

## 2022-09-28 DIAGNOSIS — M25531 Pain in right wrist: Secondary | ICD-10-CM | POA: Diagnosis not present

## 2022-09-28 MED ORDER — ALLOPURINOL 300 MG PO TABS
300.0000 mg | ORAL_TABLET | Freq: Every day | ORAL | 3 refills | Status: DC
Start: 2022-09-28 — End: 2023-01-27

## 2022-09-28 NOTE — Telephone Encounter (Deleted)
Ok to refill 

## 2022-09-28 NOTE — Telephone Encounter (Signed)
I spent 5 total minutes of online digital evaluation and management services in this patient-initiated request for online care. 

## 2022-10-28 ENCOUNTER — Ambulatory Visit (INDEPENDENT_AMBULATORY_CARE_PROVIDER_SITE_OTHER): Payer: 59

## 2022-10-28 DIAGNOSIS — I442 Atrioventricular block, complete: Secondary | ICD-10-CM | POA: Diagnosis not present

## 2022-10-28 LAB — CUP PACEART REMOTE DEVICE CHECK
Battery Remaining Longevity: 71 mo
Battery Remaining Percentage: 56 %
Battery Voltage: 2.99 V
Brady Statistic AP VP Percent: 26 %
Brady Statistic AP VS Percent: 1 %
Brady Statistic AS VP Percent: 74 %
Brady Statistic AS VS Percent: 1 %
Brady Statistic RA Percent Paced: 25 %
Brady Statistic RV Percent Paced: 99 %
Date Time Interrogation Session: 20240808020013
Implantable Lead Connection Status: 753985
Implantable Lead Connection Status: 753985
Implantable Lead Implant Date: 20191112
Implantable Lead Implant Date: 20191112
Implantable Lead Location: 753859
Implantable Lead Location: 753860
Implantable Pulse Generator Implant Date: 20191112
Lead Channel Impedance Value: 400 Ohm
Lead Channel Impedance Value: 560 Ohm
Lead Channel Pacing Threshold Amplitude: 0.375 V
Lead Channel Pacing Threshold Amplitude: 0.5 V
Lead Channel Pacing Threshold Pulse Width: 0.5 ms
Lead Channel Pacing Threshold Pulse Width: 0.5 ms
Lead Channel Sensing Intrinsic Amplitude: 10.9 mV
Lead Channel Sensing Intrinsic Amplitude: 3.5 mV
Lead Channel Setting Pacing Amplitude: 0.75 V
Lead Channel Setting Pacing Amplitude: 1.375
Lead Channel Setting Pacing Pulse Width: 0.5 ms
Lead Channel Setting Sensing Sensitivity: 3.5 mV
Pulse Gen Model: 2272
Pulse Gen Serial Number: 9086757

## 2022-12-14 NOTE — Progress Notes (Signed)
Remote pacemaker transmission.   

## 2023-01-27 ENCOUNTER — Ambulatory Visit (INDEPENDENT_AMBULATORY_CARE_PROVIDER_SITE_OTHER): Payer: Medicare Other

## 2023-01-27 ENCOUNTER — Other Ambulatory Visit: Payer: Self-pay | Admitting: Sports Medicine

## 2023-01-27 DIAGNOSIS — I442 Atrioventricular block, complete: Secondary | ICD-10-CM | POA: Diagnosis not present

## 2023-01-27 DIAGNOSIS — M7989 Other specified soft tissue disorders: Secondary | ICD-10-CM

## 2023-01-27 DIAGNOSIS — N200 Calculus of kidney: Secondary | ICD-10-CM | POA: Diagnosis not present

## 2023-01-27 LAB — CUP PACEART REMOTE DEVICE CHECK
Battery Remaining Longevity: 67 mo
Battery Remaining Percentage: 54 %
Battery Voltage: 2.99 V
Brady Statistic AP VP Percent: 27 %
Brady Statistic AP VS Percent: 1 %
Brady Statistic AS VP Percent: 72 %
Brady Statistic AS VS Percent: 1 %
Brady Statistic RA Percent Paced: 27 %
Brady Statistic RV Percent Paced: 99 %
Date Time Interrogation Session: 20241107022907
Implantable Lead Connection Status: 753985
Implantable Lead Connection Status: 753985
Implantable Lead Implant Date: 20191112
Implantable Lead Implant Date: 20191112
Implantable Lead Location: 753859
Implantable Lead Location: 753860
Implantable Pulse Generator Implant Date: 20191112
Lead Channel Impedance Value: 400 Ohm
Lead Channel Impedance Value: 590 Ohm
Lead Channel Pacing Threshold Amplitude: 0.5 V
Lead Channel Pacing Threshold Amplitude: 0.625 V
Lead Channel Pacing Threshold Pulse Width: 0.5 ms
Lead Channel Pacing Threshold Pulse Width: 0.5 ms
Lead Channel Sensing Intrinsic Amplitude: 3.8 mV
Lead Channel Sensing Intrinsic Amplitude: 9.9 mV
Lead Channel Setting Pacing Amplitude: 0.875
Lead Channel Setting Pacing Amplitude: 1.5 V
Lead Channel Setting Pacing Pulse Width: 0.5 ms
Lead Channel Setting Sensing Sensitivity: 3.5 mV
Pulse Gen Model: 2272
Pulse Gen Serial Number: 9086757

## 2023-01-28 ENCOUNTER — Other Ambulatory Visit: Payer: Self-pay | Admitting: Urology

## 2023-01-28 ENCOUNTER — Telehealth: Payer: Self-pay | Admitting: Cardiovascular Disease

## 2023-01-28 ENCOUNTER — Encounter: Payer: Self-pay | Admitting: Cardiovascular Disease

## 2023-01-28 NOTE — Telephone Encounter (Signed)
   Patient Name: Patrick Walter  DOB: Nov 19, 1955 MRN: [id number]  Primary Cardiologist: Tonny Bollman, MD  Chart reviewed as part of pre-operative protocol coverage. Given past medical history and time since last visit, based on ACC/AHA guidelines, Patrick Walter is at acceptable risk for the planned procedure without further cardiovascular testing.   The patient was advised that if he develops new symptoms prior to surgery to contact our office to arrange for a follow-up visit, and he verbalized understanding.  Patient can hold ASA 81 mg 5 days prior to procedure and should restart postprocedure when surgically safe and hemostasis is achieved.  I will route this recommendation to the requesting party via Epic fax function and remove from pre-op pool.  Please call with questions.  Napoleon Form, Leodis Rains, NP 01/28/2023, 12:14 PM

## 2023-01-28 NOTE — Telephone Encounter (Signed)
   Pre-operative Risk Assessment    Patient Name: Patrick Walter  DOB: Oct 03, 1955 MRN: [id number]      Request for Surgical Clearance    Procedure:   Cystocopy Left Retrograde Pyelogrem Left Ureceroscopy Laser Lithotripsy Left Stent  Date of Surgery:  Clearance 02/11/23                                 Surgeon:  Dr. Jettie Pagan Surgeon's Group or Practice Name:  [facility] Phone number:  [phone] Fax number:  [phone]   Type of Clearance Requested:   - Pharmacy:  Hold Aspirin 5 days Prior   Type of Anesthesia:  General    Additional requests/questions:  Please advise surgeon/provider what medications should be held.  Signed, Belisicia T Woods   01/28/2023, 11:53 AM

## 2023-01-28 NOTE — Progress Notes (Addendum)
PERIOPERATIVE PRESCRIPTION FOR IMPLANTED CARDIAC DEVICE PROGRAMMING  Patient Information: Name:  Patrick Walter  DOB:  01-13-1956  MRN:  [id number]    Procedure:   Cystocopy Left Retrograde Pyelogrem Left Ureceroscopy Laser Lithotripsy Left Stent Date of Surgery:  Clearance 02/11/23                               Surgeon:  Dr. Jettie Pagan Surgeon's Group or Practice Name:  [facility] Phone number:  [phone] Fax number:  [phone]   Device Information:  Clinic EP Physician: Dr. York Pellant  Device Type:  Dual Chamber Pacemaker  Manufacturer and Phone #:  [employer]: [phone] Pacemaker Dependent?:  Yes.   Date of Last Device Check:  04/05/2022 in office, 01/27/2023 remote Normal Device Function?:  Yes.    Electrophysiologist's Recommendations:  Have magnet available. Provide continuous ECG monitoring when magnet is used or reprogramming is to be performed.  Procedure will likely interfere with device function.  Device should be programmed:  Asynchronous pacing during procedure and returned to normal programming after procedure  Per Device Clinic Standing Orders, Kizzie Ide, RN  4:55 PM 01/28/2023

## 2023-02-04 NOTE — Patient Instructions (Signed)
SURGICAL WAITING ROOM VISITATION  Patients having surgery or a procedure may have no more than 2 support people in the waiting area - these visitors may rotate.    Children under the age of 2 must have an adult with them who is not the patient.  Due to an increase in RSV and influenza rates and associated hospitalizations, children ages 67 and under may not visit patients in [facility] hospitals.  If the patient needs to stay at the hospital during part of their recovery, the visitor guidelines for inpatient rooms apply. Pre-op nurse will coordinate an appropriate time for 1 support person to accompany patient in pre-op.  This support person may not rotate.    Please refer to the [facility] website for the visitor guidelines for Inpatients (after your surgery is over and you are in a regular room).    Your procedure is scheduled on: 02/11/23   Report to [facility] Main Entrance    Report to admitting at 11:15 AM   Call this number if you have problems the morning of surgery [phone]   Do not eat food or drink liquids :After Midnight.          If you have questions, please contact your surgeon's office.   FOLLOW BOWEL PREP AND ANY ADDITIONAL PRE OP INSTRUCTIONS YOU RECEIVED FROM YOUR SURGEON'S OFFICE!!!     Oral Hygiene is also important to reduce your risk of infection.                                    Remember - BRUSH YOUR TEETH THE MORNING OF SURGERY WITH YOUR REGULAR TOOTHPASTE  DENTURES WILL BE REMOVED PRIOR TO SURGERY PLEASE DO NOT APPLY "Poly grip" OR ADHESIVES!!!   Stop all vitamins and herbal supplements 7 days before surgery.   Take these medicines the morning of surgery with A SIP OF WATER: Allopurinol, Amlodipine                               You may not have any metal on your body including jewelry, and body piercing             Do not wear lotions, powders, cologne, or deodorant              Men may shave face and neck.   Do not bring  valuables to the hospital. [facility] IS NOT             RESPONSIBLE   FOR VALUABLES.   Contacts, glasses, dentures or bridgework may not be worn into surgery.  DO NOT BRING YOUR HOME MEDICATIONS TO THE HOSPITAL. PHARMACY WILL DISPENSE MEDICATIONS LISTED ON YOUR MEDICATION LIST TO YOU DURING YOUR ADMISSION IN THE HOSPITAL!    Patients discharged on the day of surgery will not be allowed to drive home.  Someone NEEDS to stay with you for the first 24 hours after anesthesia.              Please read over the following fact sheets you were given: IF YOU HAVE QUESTIONS ABOUT YOUR PRE-OP INSTRUCTIONS PLEASE CALL [phone] [facility]ras    If you received a COVID test during your pre-op visit  it is requested that you wear a mask when out in public, stay away from anyone that may not be feeling well and notify your surgeon  if you develop symptoms. If you test positive for Covid or have been in contact with anyone that has tested positive in the last 10 days please notify you surgeon.    [place] - Preparing for Surgery Before surgery, you can play an important role.  Because skin is not sterile, your skin needs to be as free of germs as possible.  You can reduce the number of germs on your skin by washing with CHG (chlorahexidine gluconate) soap before surgery.  CHG is an antiseptic cleaner which kills germs and bonds with the skin to continue killing germs even after washing. Please DO NOT use if you have an allergy to CHG or antibacterial soaps.  If your skin becomes reddened/irritated stop using the CHG and inform your nurse when you arrive at Short Stay. Do not shave (including legs and underarms) for at least 48 hours prior to the first CHG shower.  You may shave your face/neck.  Please follow these instructions carefully:  1.  Shower with CHG Soap the night before surgery and the  morning of surgery.  2.  If you choose to wash your hair, wash your hair first as usual with your normal   shampoo.  3.  After you shampoo, rinse your hair and body thoroughly to remove the shampoo.                             4.  Use CHG as you would any other liquid soap.  You can apply chg directly to the skin and wash.  Gently with a scrungie or clean washcloth.  5.  Apply the CHG Soap to your body ONLY FROM THE NECK DOWN.   Do   not use on face/ open                           Wound or open sores. Avoid contact with eyes, ears mouth and   genitals (private parts).                       Wash face,  Genitals (private parts) with your normal soap.             6.  Wash thoroughly, paying special attention to the area where your    surgery  will be performed.  7.  Thoroughly rinse your body with warm water from the neck down.  8.  DO NOT shower/wash with your normal soap after using and rinsing off the CHG Soap.                9.  Pat yourself dry with a clean towel.            10.  Wear clean pajamas.            11.  Place clean sheets on your bed the night of your first shower and do not  sleep with pets. Day of Surgery : Do not apply any lotions/deodorants the morning of surgery.  Please wear clean clothes to the hospital/surgery center.  FAILURE TO FOLLOW THESE INSTRUCTIONS MAY RESULT IN THE CANCELLATION OF YOUR SURGERY  PATIENT SIGNATURE_________________________________  NURSE SIGNATURE__________________________________  ________________________________________________________________________

## 2023-02-04 NOTE — Progress Notes (Signed)
COVID Vaccine Completed:  yes  Date of COVID positive in last 90 days:  PCP - Rodney Langton, MD Cardiologist - Tonny Bollman, MD  Cardiac clearance by Robin Searing, NP 01/28/23 Epic  Chest x-ray -  EKG - 04/05/22 Epic Stress Test -  ECHO - 07/14/22 Epic Cardiac Cath -  Pacemaker/ICD device last checked: 01/27/23 Epic- device orders in Epic/on chart Spinal Cord Stimulator: Called [employer] rep Arlys John) and informed of patient surgery.  Bowel Prep -   Sleep Study -  CPAP -   Fasting Blood Sugar -  Checks Blood Sugar _____ times a day  Last dose of GLP1 agonist-  N/A GLP1 instructions:  Hold 7 days before surgery    Last dose of SGLT-2 inhibitors-  N/A SGLT-2 instructions:  Hold 3 days before surgery    Blood Thinner Instructions:  Time Aspirin Instructions: ASA 81, hold 5 days Last Dose:  Activity level:  Can go up a flight of stairs and perform activities of daily living without stopping and without symptoms of chest pain or shortness of breath.  Able to exercise without symptoms  Unable to go up a flight of stairs without symptoms of     Anesthesia review: aortic enlargement, HTN. Prosthetic valve endocarditis, pacemaker  Patient denies shortness of breath, fever, cough and chest pain at PAT appointment  Patient verbalized understanding of instructions that were given to them at the PAT appointment. Patient was also instructed that they will need to review over the PAT instructions again at home before surgery.

## 2023-02-07 ENCOUNTER — Other Ambulatory Visit: Payer: Self-pay

## 2023-02-07 ENCOUNTER — Encounter (HOSPITAL_COMMUNITY)
Admission: RE | Admit: 2023-02-07 | Discharge: 2023-02-07 | Disposition: A | Discharge status: Home / Self Care | Payer: Medicare Other | Source: Ambulatory Visit | Attending: Urology | Admitting: Urology

## 2023-02-07 ENCOUNTER — Encounter (HOSPITAL_COMMUNITY): Payer: Self-pay

## 2023-02-07 VITALS — BP 153/90 | HR 70 | Temp 97.9°F | Resp 14 | Ht 71.0 in | Wt 214.0 lb

## 2023-02-07 DIAGNOSIS — Z950 Presence of cardiac pacemaker: Secondary | ICD-10-CM | POA: Diagnosis not present

## 2023-02-07 DIAGNOSIS — Z953 Presence of xenogenic heart valve: Secondary | ICD-10-CM | POA: Insufficient documentation

## 2023-02-07 DIAGNOSIS — I10 Essential (primary) hypertension: Secondary | ICD-10-CM | POA: Insufficient documentation

## 2023-02-07 DIAGNOSIS — Z01812 Encounter for preprocedural laboratory examination: Secondary | ICD-10-CM | POA: Insufficient documentation

## 2023-02-07 HISTORY — DX: Personal history of urinary calculi: Z87.442

## 2023-02-07 LAB — BASIC METABOLIC PANEL
Anion gap: 10 (ref 5–15)
BUN: 22 mg/dL (ref 8–23)
CO2: 23 mmol/L (ref 22–32)
Calcium: 8.9 mg/dL (ref 8.9–10.3)
Chloride: 108 mmol/L (ref 98–111)
Creatinine, Ser: 0.88 mg/dL (ref 0.61–1.24)
GFR, Estimated: >60 mL/min (ref >60)
Glucose, Bld: 99 mg/dL (ref 70–99)
Potassium: 4.1 mmol/L (ref 3.5–5.1)
Sodium: 141 mmol/L (ref 135–145)

## 2023-02-07 LAB — CBC
HCT: 42.4 % (ref 39.0–52.0)
Hemoglobin: 13.8 g/dL (ref 13.0–17.0)
MCH: 29.7 pg (ref 26.0–34.0)
MCHC: 32.5 g/dL (ref 30.0–36.0)
MCV: 91.4 fL (ref 80.0–100.0)
Platelets: 214 10*3/uL (ref 150–400)
RBC: 4.64 MIL/uL (ref 4.22–5.81)
RDW: 13.8 % (ref 11.5–15.5)
WBC: 6.4 10*3/uL (ref 4.0–10.5)
nRBC: 0 % (ref 0.0–0.2)

## 2023-02-08 NOTE — Progress Notes (Signed)
DISCUSSION: Patrick Walter is a 67 yo male who presents to PAT prior to CYSTOSCOPY WITH LEFT RETROGRADE PYELOGRAM, URETEROSCOPY WITH HOLMIUM LASER  AND LEFT STENT PLACEMENT on 02/11/23 with Dr. Cardell Peach. PMH of HTN, bicuspid aortic valve and AS s/p AVR (original in 2015 with redo in 2019), TAA, CHB s/p PPM.  Patient follows with Cardiology for aortic valve replacement in 2015 with redo 01/26/2018 due to prosthetic valve endocarditis, with post-op course complicated by CHB requiring PPM placement 01/31/18. Last seen in clinic on 05/19/22. Doing well from cardiac standpoint. Dr. Excell Seltzer recommended updating Echo which was done in April and appeared stable.   Device orders in 01/28/23 note: Electrophysiologist's Recommendations:   Have magnet available. Provide continuous ECG monitoring when magnet is used or reprogramming is to be performed.  Procedure will likely interfere with device function.  Device should be programmed:  Asynchronous pacing during procedure and returned to normal programming after procedure  Follows with PCP. Last seen 07/22/22. All issues stable at that time.   VS: BP (!) 153/90   Pulse 70   Temp 36.6 C (Oral)   Resp 14   Ht 5\' 11"  (1.803 m)   Wt 97.1 kg   SpO2 99%   BMI 29.85 kg/m   PROVIDERS: Monica Becton, MD Cardiology: Excell Seltzer  LABS: Labs reviewed: Acceptable for surgery. (all labs ordered are listed, but only abnormal results are displayed)  Labs Reviewed  BASIC METABOLIC PANEL  CBC     IMAGES:   EKG:   CV:  Echo 07/14/22:  IMPRESSIONS    1. S/P redo Aortic Valve Replacement with human allograft aortic root graft including Repair of Ascending Thoracic Aortic Aneurysm. Procedure Date: 01/26/2018.  2. Right ventricular systolic function is normal. The right ventricular size is mildly enlarged. There is normal pulmonary artery systolic pressure.  3. Left ventricular ejection fraction, by estimation, is 50 to 55%. The left ventricle has low  normal function. The left ventricle has no regional wall motion abnormalities. The left ventricular internal cavity size was moderately dilated. There is mild  left ventricular hypertrophy. Left ventricular diastolic parameters are consistent with Grade I diastolic dysfunction (impaired relaxation).  4. The mitral valve is normal in structure. Mild mitral valve regurgitation. No evidence of mitral stenosis.  5. The inferior vena cava is normal in size with greater than 50% respiratory variability, suggesting right atrial pressure of 3 mmHg.  6. Left atrial size was mildly dilated.  Past Medical History:  Diagnosis Date   Aortic stenosis    Arthritis    Ascending aorta enlargement (HCC)    Bicuspid aortic valve    Blood transfusion without reported diagnosis    Heart murmur    History of kidney stones    Hypertension    Patent foramen ovale    Closed at the time of aortic valve replacement   Pilonidal cyst    Presence of permanent cardiac pacemaker    S/P aortic valve replacement with bioprosthetic valve 12/05/2013   25 mm [facility] Ease bovine pericardial tissue valve    S/P redo aortic valve replacement with human allograft aortic root graft 01/26/2018   Septic embolism (HCC)    Brain, spleen, superior mesenteric artery    Past Surgical History:  Procedure Laterality Date   AORTIC VALVE REPLACEMENT N/A 12/05/2013   Procedure: AORTIC VALVE REPLACEMENT (AVR);  Surgeon: Purcell Nails, MD;  Location: [facility] OR;  Service: Open Heart Surgery;  Laterality: N/A;   AORTIC VALVE REPLACEMENT N/A 01/26/2018  Procedure: REDO STERNOTOMY, REDO AORTIC VALVE REPLACEMENT WITH HUMAN ALLOGRAFT INCLUDING AORTIC ROOT REPLACEMENT,  REPAIR ASCENDING THORACIC AORTIC ANEURYSM;  Surgeon: Purcell Nails, MD;  Location: [facility] OR;  Service: Open Heart Surgery;  Laterality: N/A;   HAND SURGERY Left 07/1977   HIP ARTHROPLASTY Left 02/18/2021   INTRAOPERATIVE TRANSESOPHAGEAL ECHOCARDIOGRAM N/A 12/05/2013    Procedure: INTRAOPERATIVE TRANSESOPHAGEAL ECHOCARDIOGRAM;  Surgeon: Purcell Nails, MD;  Location: [facility] OR;  Service: Open Heart Surgery;  Laterality: N/A;   LEFT AND RIGHT HEART CATHETERIZATION WITH CORONARY ANGIOGRAM N/A 11/08/2013   Procedure: LEFT AND RIGHT HEART CATHETERIZATION WITH CORONARY ANGIOGRAM;  Surgeon: Micheline Chapman, MD;  Location: [facility] CATH LAB;  Service: Cardiovascular;  Laterality: N/A;   PACEMAKER IMPLANT N/A 01/31/2018   SJM Assurity MRI DR implanted by Dr Johney Frame for complete heart block   PARATHYROIDECTOMY Left 11/06/2021   Procedure: LEFT PARATHYROIDECTOMY;  Surgeon: Darnell Level, MD;  Location: [facility] ORS;  Service: General;  Laterality: Left;   PATENT FORAMEN OVALE CLOSURE N/A 12/05/2013   Procedure: PATENT FORAMEN OVALE CLOSURE;  Surgeon: Purcell Nails, MD;  Location: [facility] OR;  Service: Open Heart Surgery;  Laterality: N/A;   PILONIDAL CYST / SINUS EXCISION  01/20/2018   Dr Luisa Hart   TEE WITHOUT CARDIOVERSION N/A 10/23/2013   Procedure: TRANSESOPHAGEAL ECHOCARDIOGRAM (TEE);  Surgeon: Laurey Morale, MD;  Location: [facility] ENDOSCOPY;  Service: Cardiovascular;  Laterality: N/A;   TEE WITHOUT CARDIOVERSION N/A 01/19/2018   Procedure: TRANSESOPHAGEAL ECHOCARDIOGRAM (TEE);  Surgeon: Thurmon Fair, MD;  Location: [facility] ENDOSCOPY;  Service: Cardiovascular;  Laterality: N/A;   TEE WITHOUT CARDIOVERSION N/A 01/26/2018   Procedure: TRANSESOPHAGEAL ECHOCARDIOGRAM (TEE);  Surgeon: Purcell Nails, MD;  Location: [facility] OR;  Service: Open Heart Surgery;  Laterality: N/A;   VASECTOMY  06/2002    MEDICATIONS:  allopurinol (ZYLOPRIM) 300 MG tablet   amLODipine (NORVASC) 5 MG tablet   APPLE CIDER VINEGAR PO   Ascorbic Acid (VITAMIN C) 1000 MG tablet   aspirin EC 81 MG tablet   B[place]ia-Glucosamine-Vit D (GLUCOSAMINE COMPLEX PO)   CALCIUM PO   Cholecalciferol (VITAMIN D3 PO)   Coenzyme Q10 300 MG CAPS   Cyanocobalamin (B-12 PO)   MAGNESIUM PO   Omega 3 1000 MG CAPS   Probiotic Product  (PROBIOTIC PO)   TURMERIC PO   No current facility-administered medications for this encounter.   Marcille Blanco [facility] Surgical Short Stay/Anesthesiology [facility] Phone [phone] 02/08/2023 12:27 PM

## 2023-02-08 NOTE — Anesthesia Preprocedure Evaluation (Addendum)
Anesthesia Evaluation  Patient identified by MRN, date of birth, ID band Patient awake    Reviewed: Allergy & Precautions, NPO status , Patient's Chart, lab work & pertinent test results  History of Anesthesia Complications Negative for: history of anesthetic complications  Airway Mallampati: II  TM Distance: >3 FB Neck ROM: Full    Dental no notable dental hx.    Pulmonary neg pulmonary ROS   Pulmonary exam normal        Cardiovascular hypertension, Normal cardiovascular exam+ pacemaker   Bicuspid AV s/p AVR (original in 2015 with redo in 2019)  Echo 07/14/22:  1. S/P redo Aortic Valve Replacement with human allograft aortic root graft including Repair of Ascending Thoracic Aortic Aneurysm. Procedure Date: 01/26/2018.  2. Right ventricular systolic function is normal. The right ventricular size is mildly enlarged. There is normal pulmonary artery systolic pressure.  3. Left ventricular ejection fraction, by estimation, is 50 to 55%. The left ventricle has low normal function. The left ventricle has no regional wall motion abnormalities. The left ventricular internal cavity size was moderately dilated. There is mild left ventricular hypertrophy. Left ventricular diastolic parameters are consistent with Grade I diastolic dysfunction (impaired relaxation).  4. The mitral valve is normal in structure. Mild mitral valve regurgitation. No evidence of mitral stenosis.  5. The inferior vena cava is normal in size with greater than 50% respiratory variability, suggesting right atrial pressure of 3 mmHg.  6. Left atrial size was mildly dilated.    Neuro/Psych negative neurological ROS     GI/Hepatic negative GI ROS, Neg liver ROS,,,  Endo/Other  negative endocrine ROS    Renal/GU LEFT RENAL STONE     Musculoskeletal  (+) Arthritis ,    Abdominal   Peds  Hematology negative hematology ROS (+)   Anesthesia Other Findings Day of  surgery medications reviewed with patient.  Reproductive/Obstetrics                              Anesthesia Physical Anesthesia Plan  ASA: 3  Anesthesia Plan: General   Post-op Pain Management: Tylenol PO (pre-op)*   Induction: Intravenous  PONV Risk Score and Plan: 2 and Ondansetron, Dexamethasone, Treatment may vary due to age or medical condition and Midazolam  Airway Management Planned: LMA  Additional Equipment: None  Intra-op Plan:   Post-operative Plan: Extubation in OR  Informed Consent: I have reviewed the patients History and Physical, chart, labs and discussed the procedure including the risks, benefits and alternatives for the proposed anesthesia with the patient or authorized representative who has indicated his/her understanding and acceptance.     Dental advisory given  Plan Discussed with: CRNA  Anesthesia Plan Comments: (See PAT note from 11/18 by Sherlie Ban PA-C )         Anesthesia Quick Evaluation

## 2023-02-11 ENCOUNTER — Other Ambulatory Visit: Payer: Self-pay

## 2023-02-11 ENCOUNTER — Ambulatory Visit (HOSPITAL_COMMUNITY): Payer: Medicare Other

## 2023-02-11 ENCOUNTER — Encounter (HOSPITAL_COMMUNITY)
Admission: RE | Disposition: A | Discharge status: Home / Self Care | Payer: Self-pay | Source: Home / Self Care | Attending: Urology

## 2023-02-11 ENCOUNTER — Encounter (HOSPITAL_COMMUNITY): Payer: Self-pay | Admitting: Urology

## 2023-02-11 ENCOUNTER — Ambulatory Visit (HOSPITAL_COMMUNITY)
Admission: RE | Admit: 2023-02-11 | Discharge: 2023-02-11 | Disposition: A | Discharge status: Home / Self Care | Payer: Medicare Other | Attending: Urology | Admitting: Urology

## 2023-02-11 ENCOUNTER — Ambulatory Visit (HOSPITAL_COMMUNITY): Payer: Medicare Other | Admitting: Medical

## 2023-02-11 ENCOUNTER — Ambulatory Visit (HOSPITAL_BASED_OUTPATIENT_CLINIC_OR_DEPARTMENT_OTHER): Payer: Medicare Other | Admitting: Anesthesiology

## 2023-02-11 DIAGNOSIS — Z950 Presence of cardiac pacemaker: Secondary | ICD-10-CM | POA: Diagnosis not present

## 2023-02-11 DIAGNOSIS — Z953 Presence of xenogenic heart valve: Secondary | ICD-10-CM | POA: Diagnosis not present

## 2023-02-11 DIAGNOSIS — N200 Calculus of kidney: Secondary | ICD-10-CM | POA: Insufficient documentation

## 2023-02-11 DIAGNOSIS — M109 Gout, unspecified: Secondary | ICD-10-CM | POA: Diagnosis not present

## 2023-02-11 DIAGNOSIS — M1990 Unspecified osteoarthritis, unspecified site: Secondary | ICD-10-CM | POA: Insufficient documentation

## 2023-02-11 DIAGNOSIS — I10 Essential (primary) hypertension: Secondary | ICD-10-CM | POA: Diagnosis not present

## 2023-02-11 HISTORY — PX: CYSTOSCOPY WITH RETROGRADE PYELOGRAM, URETEROSCOPY AND STENT PLACEMENT: SHX5789

## 2023-02-11 SURGERY — CYSTOURETEROSCOPY, WITH RETROGRADE PYELOGRAM AND STENT INSERTION
Anesthesia: General | Laterality: Left

## 2023-02-11 MED ORDER — DOCUSATE SODIUM 100 MG PO CAPS: 100.0000 mg | ORAL_CAPSULE | Freq: Every day | ORAL | 0 refills | Status: AC | PRN

## 2023-02-11 MED ORDER — ORAL CARE MOUTH RINSE: 15.0000 mL | Freq: Once | OROMUCOSAL | Status: AC

## 2023-02-11 MED ORDER — LIDOCAINE HCL (PF) 2 % IJ SOLN
INTRAMUSCULAR | Status: AC
  Filled 2023-02-11: qty 5

## 2023-02-11 MED ORDER — CHLORHEXIDINE GLUCONATE 0.12 % MT SOLN
15.0000 mL | Freq: Once | OROMUCOSAL | Status: AC
  Administered 2023-02-11: 15 mL via OROMUCOSAL

## 2023-02-11 MED ORDER — DEXAMETHASONE SODIUM PHOSPHATE 4 MG/ML IJ SOLN
INTRAMUSCULAR | Status: DC | PRN
  Administered 2023-02-11: 5 mg via INTRAVENOUS

## 2023-02-11 MED ORDER — ONDANSETRON HCL 4 MG/2ML IJ SOLN
INTRAMUSCULAR | Status: AC
  Filled 2023-02-11: qty 2

## 2023-02-11 MED ORDER — OXYCODONE HCL 5 MG/5ML PO SOLN: 5.0000 mg | Freq: Once | ORAL | Status: DC | PRN

## 2023-02-11 MED ORDER — PROPOFOL 10 MG/ML IV BOLUS
INTRAVENOUS | Status: AC
Start: 2023-02-11 — End: ?
  Filled 2023-02-11: qty 20

## 2023-02-11 MED ORDER — FENTANYL CITRATE (PF) 100 MCG/2ML IJ SOLN
INTRAMUSCULAR | Status: DC | PRN
  Administered 2023-02-11: 50 ug via INTRAVENOUS
  Administered 2023-02-11: 100 ug via INTRAVENOUS
  Administered 2023-02-11: 50 ug via INTRAVENOUS

## 2023-02-11 MED ORDER — FENTANYL CITRATE PF 50 MCG/ML IJ SOSY: 25.0000 ug | PREFILLED_SYRINGE | INTRAMUSCULAR | Status: DC | PRN

## 2023-02-11 MED ORDER — ONDANSETRON HCL 4 MG/2ML IJ SOLN
INTRAMUSCULAR | Status: DC | PRN
  Administered 2023-02-11: 4 mg via INTRAVENOUS

## 2023-02-11 MED ORDER — OXYCODONE-ACETAMINOPHEN 5-325 MG PO TABS: 1.0000 | ORAL_TABLET | ORAL | 0 refills | Status: AC | PRN

## 2023-02-11 MED ORDER — IOHEXOL 300 MG/ML  SOLN
INTRAMUSCULAR | Status: DC | PRN
  Administered 2023-02-11: 10 mL

## 2023-02-11 MED ORDER — LIDOCAINE HCL (CARDIAC) PF 100 MG/5ML IV SOSY
PREFILLED_SYRINGE | INTRAVENOUS | Status: DC | PRN
  Administered 2023-02-11: 100 mg via INTRAVENOUS

## 2023-02-11 MED ORDER — LACTATED RINGERS IV SOLN: INTRAVENOUS | Status: DC

## 2023-02-11 MED ORDER — CEFAZOLIN SODIUM-DEXTROSE 2-4 GM/100ML-% IV SOLN
2.0000 g | INTRAVENOUS | Status: AC
  Administered 2023-02-11: 2 g via INTRAVENOUS
  Filled 2023-02-11: qty 100

## 2023-02-11 MED ORDER — OXYCODONE HCL 5 MG PO TABS: 5.0000 mg | ORAL_TABLET | Freq: Once | ORAL | Status: DC | PRN

## 2023-02-11 MED ORDER — MIDAZOLAM HCL 2 MG/2ML IJ SOLN
INTRAMUSCULAR | Status: AC
  Filled 2023-02-11: qty 2

## 2023-02-11 MED ORDER — FENTANYL CITRATE (PF) 100 MCG/2ML IJ SOLN
INTRAMUSCULAR | Status: AC
  Filled 2023-02-11: qty 2

## 2023-02-11 MED ORDER — ACETAMINOPHEN 500 MG PO TABS
1000.0000 mg | ORAL_TABLET | Freq: Once | ORAL | Status: AC
  Administered 2023-02-11: 1000 mg via ORAL
  Filled 2023-02-11: qty 2

## 2023-02-11 MED ORDER — DROPERIDOL 2.5 MG/ML IJ SOLN: 0.6250 mg | Freq: Once | INTRAMUSCULAR | Status: DC | PRN

## 2023-02-11 MED ORDER — EPHEDRINE 5 MG/ML INJ
INTRAVENOUS | Status: AC
  Filled 2023-02-11: qty 5

## 2023-02-11 MED ORDER — MIDAZOLAM HCL 5 MG/5ML IJ SOLN
INTRAMUSCULAR | Status: DC | PRN
  Administered 2023-02-11: 2 mg via INTRAVENOUS

## 2023-02-11 MED ORDER — SODIUM CHLORIDE 0.9 % IR SOLN
Status: DC | PRN
  Administered 2023-02-11: 3000 mL via INTRAVESICAL

## 2023-02-11 MED ORDER — EPHEDRINE SULFATE-NACL 50-0.9 MG/10ML-% IV SOSY
PREFILLED_SYRINGE | INTRAVENOUS | Status: DC | PRN
  Administered 2023-02-11: 5 mg via INTRAVENOUS

## 2023-02-11 MED ORDER — PROPOFOL 10 MG/ML IV BOLUS
INTRAVENOUS | Status: DC | PRN
  Administered 2023-02-11: 180 mg via INTRAVENOUS

## 2023-02-11 SURGICAL SUPPLY — 23 items
BAG URO CATCHER STRL LF (MISCELLANEOUS) ×1 IMPLANT
BASKET ZERO TIP NITINOL 2.4FR (BASKET) IMPLANT
BENZOIN TINCTURE PRP APPL 2/3 (GAUZE/BANDAGES/DRESSINGS) IMPLANT
CATH URETERAL DUAL LUMEN 10F (MISCELLANEOUS) IMPLANT
CATH URETL OPEN 5X70 (CATHETERS) ×1 IMPLANT
CLOTH BEACON ORANGE TIMEOUT ST (SAFETY) ×1 IMPLANT
DRSG TEGADERM 2-3/8X2-3/4 SM (GAUZE/BANDAGES/DRESSINGS) IMPLANT
FIBER LASER MOSES 200 DFL (Laser) IMPLANT
GLOVE BIOGEL M 7.0 STRL (GLOVE) ×1 IMPLANT
GOWN STRL REUS W/ TWL XL LVL3 (GOWN DISPOSABLE) ×1 IMPLANT
GUIDEWIRE STR DUAL SENSOR (WIRE) ×2 IMPLANT
GUIDEWIRE ZIPWRE .038 STRAIGHT (WIRE) IMPLANT
KIT TURNOVER KIT A (KITS) IMPLANT
LASER FIB FLEXIVA PULSE ID 365 (Laser) IMPLANT
MANIFOLD NEPTUNE II (INSTRUMENTS) ×1 IMPLANT
PACK CYSTO (CUSTOM PROCEDURE TRAY) ×1 IMPLANT
PAD PREP 24X48 CUFFED NSTRL (MISCELLANEOUS) ×1 IMPLANT
SHEATH DILATOR SET 8/10 (MISCELLANEOUS) IMPLANT
SHEATH NAVIGATOR HD 12/14X46 (SHEATH) IMPLANT
STENT URET 6FRX26 CONTOUR (STENTS) IMPLANT
TRACTIP FLEXIVA PULS ID 200XHI (Laser) IMPLANT
TUBING CONNECTING 10 (TUBING) ×1 IMPLANT
TUBING UROLOGY SET (TUBING) ×1 IMPLANT

## 2023-02-11 NOTE — Anesthesia Procedure Notes (Signed)
Procedure Name: LMA Insertion Date/Time: 02/11/2023 3:22 PM  Performed by: Vanessa  Chapel, CRNAPre-anesthesia Checklist: Emergency Drugs available, Patient identified, Suction available and Patient being monitored Patient Re-evaluated:Patient Re-evaluated prior to induction Oxygen Delivery Method: Circle system utilized Preoxygenation: Pre-oxygenation with 100% oxygen Induction Type: IV induction Ventilation: Mask ventilation without difficulty LMA: LMA inserted LMA Size: 4.0 Number of attempts: 1 Placement Confirmation: positive ETCO2 and breath sounds checked- equal and bilateral Tube secured with: Tape Dental Injury: Teeth and Oropharynx as per pre-operative assessment

## 2023-02-11 NOTE — Anesthesia Postprocedure Evaluation (Signed)
Anesthesia Post Note  Patient: Patrick Walter  Procedure(s) Performed: CYSTOSCOPY WITH LEFT RETROGRADE PYELOGRAM, URETEROSCOPY WITH HOLMIUM LASER  AND LEFT STENT PLACEMENT (Left)     Patient location during evaluation: PACU Anesthesia Type: General Level of consciousness: awake and alert Pain management: pain level controlled Vital Signs Assessment: post-procedure vital signs reviewed and stable Respiratory status: spontaneous breathing, nonlabored ventilation and respiratory function stable Cardiovascular status: blood pressure returned to baseline Postop Assessment: no apparent nausea or vomiting Anesthetic complications: no   No notable events documented.  Last Vitals:  Vitals:   02/11/23 1645 02/11/23 1655  BP: 130/82   Pulse: 63 60  Resp: 16 12  Temp:    SpO2: 95% 97%    Last Pain:  Vitals:   02/11/23 1216  TempSrc: Oral                 Shanda Howells

## 2023-02-11 NOTE — Op Note (Signed)
Operative Note  Preoperative diagnosis:  1.  Left renal stone  Postoperative diagnosis: 1.  Left renal stone  Procedure(s): 1.  Cystoscopy 2. Left ureteroscopy with laser lithotripsy and basket extraction of stones 3. Left retrograde pyelogram 4. Left ureteral stent placement 5. Fluoroscopy with intraoperative interpretation  Surgeon: Jettie Pagan, MD  Assistants:  None  Anesthesia:  General  Complications:  None  EBL:  Minimal  Specimens: 1. Stones for stone analysis (to be done at [facility])  Drains/Catheters: 1.  Left 6Fr x 26cm ureteral stent with a tether string  Intraoperative findings:   Cystoscopy demonstrated no suspicious stone lesions. Left ureteroscopy demonstrated about 1cm stone in the renal pelvis about 50% dusted and 50% fragmented with all large fragments removed. Left retrograde pyelogram with no hydronephrosis. Successful stent placement.  Indication:  Patrick Walter is a 67 y.o. male with a history of a left renal stone here for left ureteroscopy with laser lithotripsy and basket extraction of stone.  Description of procedure: After informed consent was obtained from the patient, the patient was identified and taken to the operating room and placed in the supine position.  General anesthesia was administered as well as perioperative IV antibiotics.  At the beginning of the case, a time-out was performed to properly identify the patient, the surgery to be performed, and the surgical site.  Sequential compression devices were applied to the lower extremities at the beginning of the case for DVT prophylaxis.  The patient was then placed in the dorsal lithotomy supine position, prepped and draped in sterile fashion.  We then passed the 21-French rigid cystoscope through the urethra and into the bladder under vision without any difficulty, noting a normal urethra without strictures and a mildly obstructing prostate.  A systematic evaluation of the  bladder revealed no evidence of any suspicious bladder lesions.  Ureteral orifices were in normal position.    Under cystoscopic and flouroscopic guidance, we cannulated the left ureteral orifice with a 5-French open-ended ureteral catheter and a gentle retrograde pyelogram was performed, revealing a normal caliber ureter without any filling defects. There was no hydronephrosis of the collecting system. There was a 1cm filling defect in the renal pelvis corresponding to the stone. A 0.038 sensor wire was then passed up to the level of the renal pelvis and secured to the drape as a safety wire. The ureteral catheter and cystoscope were removed, leaving the safety wire in place.   A semi-rigid ureteroscope was passed alongside the wire up the distal ureter which appeared normal. I was able to pass this scope all the way to the renal pelvis where I encountered a large 1cm renal pelvis stone. Using a 200um holmium laser fiber, 50% of the stone was dusted and the other 50% was fragmented. A zero tip basket was used to remove the larger stone fragments. A second 0.038 sensor wire was passed under direct vision and the semirigid scope was removed. The flexible ureteroscope was advanced into the collecting system via the access sheath. The collecting system was inspected. There were smaller fragments identified at the left upper pole. Using the 272 micron holmium laser fiber, the stones were dusted completely. A 2.2 Fr zero tip basket was used to remove the fragments under visual guidance. These were sent for chemical analysis. With the ureteroscope in the kidney, a gentle pyelogram was performed to delineate the calyceal system and we evaluated the calyces systematically. We encountered a no further large stone fragments. The rest of the stone  fragments were very tiny and these were  irrigated away gently. The calyces were re-inspected and there were no significant stone fragment residual.   We then withdrew the  ureteroscope back down the ureter, noting no evidence of any stones along the course of the ureter.  Prior to removing the ureteroscope, we did pass the Glidewire back up to the ureter to the renal pelvis.    Once the ureteroscope was removed, the Glidewire was backloaded through the rigid cystoscope, which was then advanced down the urethra and into the bladder. We then used the Glidewire under direct vision through the rigid cystoscope and under fluoroscopic guidance and passed up a 6-French, 26 cm double-pigtail ureteral stent up ureter, making sure that the proximal and distal ends coiled within the kidney and bladder respectively.  Note that we left a long tether string attached to the distal end of the ureteral stent and it exited the urethral meatus and was secured to the penile shaft with a tegaderm adhesive.  The cystoscope was then advanced back into the bladder under vision.  We were able to see the distal stent coiling nicely within the bladder.  The bladder was then emptied with irrigation solution.  The cystoscope was then removed.    The patient tolerated the procedure well and there was no complication. Patient was awoken from anesthesia and taken to the recovery room in stable condition. I was present and scrubbed for the entirety of the case.  Plan:  Patient will be discharged home.  He may remove his stent by pulling on attached string on Monday.   [name] MD [facility]  Pager: [phone]

## 2023-02-11 NOTE — H&P (Signed)
Office Visit Report     01/27/2023   --------------------------------------------------------------------------------   Patrick Walter  MRN: [id number]  DOB: Dec 22, 1955, 67 year old Male   PRIMARY CARE:  Ihor Austin. Benjamin Stain, MD  PRIMARY CARE FAX:  [phone]  REFERRING:  Ihor Austin. Benjamin Stain, MD  PROVIDER:  Jettie Pagan, M.D.  LOCATION:  [facility] [phone]     --------------------------------------------------------------------------------   CC/HPI: Patrick Walter is a 67 year old male who is seen in consultation today for urolithiasis.   1. Urolithiasis:  Given complaints of intermittent left lower back pain, he underwent x-ray lumbar spine 07/26/2022 with an incidentally revealed left renal stone measuring 1.1 cm. He denies prior history of urolithiasis and denies prior medical expulsive therapy or surgical intervention. He has complaint of intermittent left lower back pain that appears to be musculoskeletal. He denies any flank pain.  -KUB 01/27/2023: With a 1.2 cm stone overlying the left renal shadow.   He has a past medical history of arthritis, gout, HTN. He has a prior arrhythmia and has had a pacemaker. He also has history of aortic valve repair.     ALLERGIES: None   MEDICATIONS: Allopurinol 300 mg tablet  Amlodipine Besylate 5 mg tablet     GU PSH: None   NON-GU PSH: Aortic valve replacement (tissue) - 2015 Hip Arthroscopy/surgery Parathyroidectomy Total Hip Arthroplasty     GU PMH: None   NON-GU PMH: Arthritis Cardiac murmur, unspecified Gout Heart disease, unspecified Hypertension    FAMILY HISTORY: None   SOCIAL HISTORY: Marital Status: Married Current Smoking Status: Patient has never smoked.   Tobacco Use Assessment Completed: Used Tobacco in last 30 days? Social Drinker.  Drinks 3 caffeinated drinks per day.    REVIEW OF SYSTEMS:    GU Review Male:   Patient reports frequent urination and get up at night to  urinate. Patient denies hard to postpone urination, burning/ pain with urination, leakage of urine, stream starts and stops, trouble starting your stream, have to strain to urinate , erection problems, and penile pain.  Gastrointestinal (Upper):   Patient denies nausea, vomiting, and indigestion/ heartburn.  Gastrointestinal (Lower):   Patient denies diarrhea and constipation.  Constitutional:   Patient denies fever, night sweats, weight loss, and fatigue.  Skin:   Patient denies skin rash/ lesion and itching.  Eyes:   Patient denies blurred vision and double vision.  Ears/ Nose/ Throat:   Patient denies sore throat and sinus problems.  Hematologic/Lymphatic:   Patient denies swollen glands and easy bruising.  Cardiovascular:   Patient denies leg swelling and chest pains.  Respiratory:   Patient denies cough and shortness of breath.  Endocrine:   Patient denies excessive thirst.  Musculoskeletal:   Patient denies back pain and joint pain.  Neurological:   Patient denies headaches and dizziness.  Psychologic:   Patient denies depression and anxiety.   Notes: hematuria    VITAL SIGNS:      01/27/2023 02:12 PM  Weight 210 lb / 95.25 kg  Height 71 in / 180.34 cm  BP 123/82 mmHg  Pulse 73 /min  Temperature 97.3 F / 36.2 C  BMI 29.3 kg/m   MULTI-SYSTEM PHYSICAL EXAMINATION:    Constitutional: Well-nourished. No physical deformities. Normally developed. Good grooming.  Respiratory: No labored breathing, no use of accessory muscles.   Cardiovascular: Normal temperature, normal extremity pulses, no swelling, no varicosities.  Gastrointestinal: No mass, no tenderness, no rigidity, non obese abdomen.     Complexity of Data:  Source Of History:  Patient, Medical Record Summary  Records Review:   Previous Patient Records  Urine Test Review:   Urinalysis  X-Ray Review: KUB: Reviewed Films. Reviewed Report. Discussed With Patient.     PROCEDURES:         KUB - [id number]  A single view of the  abdomen is obtained. 1.2cm left renal stone      Patient confirmed No Neulasta OnPro Device.           Visit Complexity - G2211          Urinalysis w/Scope - 81001 Dipstick Dipstick Cont'd Micro  Color: Yellow Bilirubin: Neg WBC/hpf: NS (Not Seen)  Appearance: Clear Ketones: Neg RBC/hpf: 10 - 20/hpf  Specific Gravity: 1.020 Blood: 3+ Bacteria: NS (Not Seen)  pH: 6.5 Protein: Trace Cystals: NS (Not Seen)  Glucose: Neg Urobilinogen: 0.2 Casts: NS (Not Seen)    Nitrites: Neg Trichomonas: Not Present    Leukocyte Esterase: Neg Mucous: Not Present      Epithelial Cells: NS (Not Seen)      Yeast: NS (Not Seen)      Sperm: Not Present    Notes:      ASSESSMENT:      ICD-10 Details  1 GU:   Renal calculus - N20.0    PLAN:           Orders Labs Urine Culture  X-Rays: KUB. No Oral Contrast          Schedule         Document Letter(s):  Created for Patient: Clinical Summary         Notes:    #1. Left renal stone:  -KUB 01/27/2023 with 1.1 cm left renal stone. We discussed options including ESWL, ureteroscopy and PCNL. He elects to to proceed with ureteroscopy. He understands that this may be a staged approach. Surgical letter submitted.   We discussed the options for management of kidney stones, including observation, ESWL, ureteroscopy with laser lithotripsy, and PCNL. The risks and benefits of each option were discussed.  For observation I described the risks which include but are not limited to silent renal damage, life-threatening infection, need for emergent surgery, failure to pass stone, and pain.   ESWL: risks and benefits of ESWL were outlined including infection, bleeding, pain, steinstrasse, kidney injury, need for ancillary treatments, and global anesthesia risks including but not limited to CVA, MI, DVT, PE, pneumonia, and death.   Ureteroscopy: risks and benefits of ureteroscopy were outlined, including infection, bleeding, pain, temporary ureteral stent and  associated stent bother, ureteral injury, ureteral stricture, need for ancillary treatments, and global anesthesia risks including but not limited to CVA, MI, DVT, PE, pneumonia, and death.   PCNL: risks and benefits of PCNL were outlined including infection, bleeding, blood transfusion, pain, pneumothorax, bowel injury, persistent urine leak, positioning injury, inability to clear stone burden, renal laceration, arterial venous fistula or malformation, need for ancillary treatments, and global anesthesia risks including but not limited to CVA, MI, DVT, PE, pneumonia, and death.   We discussed dietary methods for stone prevention including the following: increased water intake to 2-3 liters per day, add lemon or lemon concentrate to water to increase citrate which is beneficial for stone prevention, limiting dietary sodium to less than 2000 mg per day, limiting animal protein to less than 2 servings (16 ounces/day), and limiting foods high in oxalate content (spinach, beans, chocolate, etc.).   CC: Rodney Langton, MD    Urology Preoperative H&P  Chief Complaint: Left renal stone  History of Present Illness: Patrick Walter is a 67 y.o. male with a left renal stone here for ureteroscopy with laser lithotripsy and basket extraction of stone. Denies fevers, chills, dysuria.  Past Medical History:  Diagnosis Date   Aortic stenosis    Arthritis    Ascending aorta enlargement (HCC)    Bicuspid aortic valve    Blood transfusion without reported diagnosis    Heart murmur    History of kidney stones    Hypertension    Patent foramen ovale    Closed at the time of aortic valve replacement   Pilonidal cyst    Presence of permanent cardiac pacemaker    S/P aortic valve replacement with bioprosthetic valve 12/05/2013   25 mm [employer] Ease bovine pericardial tissue valve    S/P redo aortic valve replacement with human allograft aortic root graft 01/26/2018   Septic embolism (HCC)     Brain, spleen, superior mesenteric artery    Past Surgical History:  Procedure Laterality Date   AORTIC VALVE REPLACEMENT N/A 12/05/2013   Procedure: AORTIC VALVE REPLACEMENT (AVR);  Surgeon: Purcell Nails, MD;  Location[facility] OR;  Service: Open Heart Surgery;  Laterality: N/A;   AORTIC VALVE REPLACEMENT N/A 01/26/2018   Procedure: REDO STERNOTOMY, REDO AORTIC VALVE REPLACEMENT WITH HUMAN ALLOGRAFT INCLUDING AORTIC ROOT REPLACEMENT,  REPAIR ASCENDING THORACIC AORTIC ANEURYSM;  Surgeon: Purcell Nails, MD;  Location: [facility] OR;  Service: Open Heart Surgery;  Laterality: N/A;   HAND SURGERY Left 07/1977   HIP ARTHROPLASTY Left 02/18/2021   INTRAOPERATIVE TRANSESOPHAGEAL ECHOCARDIOGRAM N/A 12/05/2013   Procedure: INTRAOPERATIVE TRANSESOPHAGEAL ECHOCARDIOGRAM;  Surgeon: Purcell Nails, MD;  Location: [facility] OR;  Service: Open Heart Surgery;  Laterality: N/A;   LEFT AND RIGHT HEART CATHETERIZATION WITH CORONARY ANGIOGRAM N/A 11/08/2013   Procedure: LEFT AND RIGHT HEART CATHETERIZATION WITH CORONARY ANGIOGRAM;  Surgeon: Micheline Chapman, MD;  Location: [facility] CATH LAB;  Service: Cardiovascular;  Laterality: N/A;   PACEMAKER IMPLANT N/A 01/31/2018   SJM Assurity MRI DR implanted by Dr Johney Frame for complete heart block   PARATHYROIDECTOMY Left 11/06/2021   Procedure: LEFT PARATHYROIDECTOMY;  Surgeon: Darnell Level, MD;  Location: [facility] ORS;  Service: General;  Laterality: Left;   PATENT FORAMEN OVALE CLOSURE N/A 12/05/2013   Procedure: PATENT FORAMEN OVALE CLOSURE;  Surgeon: Purcell Nails, MD;  Location: [facility] OR;  Service: Open Heart Surgery;  Laterality: N/A;   PILONIDAL CYST / SINUS EXCISION  01/20/2018   Dr Luisa Hart   TEE WITHOUT CARDIOVERSION N/A 10/23/2013   Procedure: TRANSESOPHAGEAL ECHOCARDIOGRAM (TEE);  Surgeon: Laurey Morale, MD;  Location: [facility] ENDOSCOPY;  Service: Cardiovascular;  Laterality: N/A;   TEE WITHOUT CARDIOVERSION N/A 01/19/2018   Procedure: TRANSESOPHAGEAL ECHOCARDIOGRAM (TEE);  Surgeon:  Thurmon Fair, MD;  Location: [facility] ENDOSCOPY;  Service: Cardiovascular;  Laterality: N/A;   TEE WITHOUT CARDIOVERSION N/A 01/26/2018   Procedure: TRANSESOPHAGEAL ECHOCARDIOGRAM (TEE);  Surgeon: Purcell Nails, MD;  Location: [facility] OR;  Service: Open Heart Surgery;  Laterality: N/A;   VASECTOMY  06/2002    Allergies: No Known Allergies  Family History  Problem Relation Age of Onset   Diabetes Mother    Cancer Mother        colon   Diabetes Father    Cancer Father        colon   Colon cancer Father 8   Colon polyps Father    Diabetes Paternal Grandmother    Cancer Paternal Grandmother  Colon cancer Paternal Grandmother 16   Colon polyps Paternal Grandmother    Cancer Daughter        leukemia   Heart attack Maternal Grandmother    Hypertension Maternal Grandmother    Diabetes Maternal Grandmother    Cancer Maternal Grandmother    Diabetes Maternal Grandfather    Cancer Maternal Grandfather    Stroke Paternal Grandfather    Diabetes Paternal Grandfather    Cancer Paternal Grandfather    Esophageal cancer Neg Hx    Stomach cancer Neg Hx     Social History:  reports that he has never smoked. He has never used smokeless tobacco. He reports current alcohol use. He reports that he does not use drugs.  ROS: A complete review of systems was performed.  All systems are negative except for pertinent findings as noted.  Physical Exam:  Vital signs in last 24 hours: Temp:  [98.7 F (37.1 C)] 98.7 F (37.1 C) (11/22 1216) Pulse Rate:  [61] 61 (11/22 1216) Resp:  [14] 14 (11/22 1216) BP: (139)/(81) 139/81 (11/22 1216) SpO2:  [94 %] 94 % (11/22 1216) Weight:  [97.7 kg] 97.7 kg (11/22 1127) Constitutional:  Alert and oriented, No acute distress Cardiovascular: Regular rate and rhythm Respiratory: Normal respiratory effort, Lungs clear bilaterally GI: Abdomen is soft, nontender, nondistended, no abdominal masses GU: No CVA tenderness Lymphatic: No lymphadenopathy Neurologic:  Grossly intact, no focal deficits Psychiatric: Normal mood and affect  Laboratory Data:  No results for input(s): "WBC", "HGB", "HCT", "PLT" in the last 72 hours.  No results for input(s): "NA", "K", "CL", "GLUCOSE", "BUN", "CALCIUM", "CREATININE" in the last 72 hours.  Invalid input(s): "CO3"   No results found for this or any previous visit (from the past 24 hour(s)). No results found for this or any previous visit (from the past 240 hour(s)).  Renal Function: Recent Labs    02/07/23 0815  CREATININE 0.88   Estimated Creatinine Clearance: 99.8 mL/min (by C-G formula based on SCr of 0.88 mg/dL).  Radiologic Imaging: No results found.  I independently reviewed the above imaging studies.  Assessment and Plan Patrick Walter is a 67 y.o. male with a left renal stone here for ureteroscopy with laser lithotripsy and basket extraction of stone.  -The risks, benefits and alternatives of cystoscopy with left ureteroscopy and left JJ stent placement was discussed with the patient.  Risks include, but are not limited to: bleeding, urinary tract infection, ureteral injury, ureteral stricture disease, chronic pain, urinary symptoms, bladder injury, stent migration, the need for nephrostomy tube placement, MI, CVA, DVT, PE and the inherent risks with general anesthesia.  The patient voices understanding and wishes to proceed.   Matt R. Liesl Simons MD 02/11/2023, 2:44 PM  [facility] Pager: [phone]): [phone]

## 2023-02-11 NOTE — Transfer of Care (Signed)
Immediate Anesthesia Transfer of Care Note  Patient: Patrick Walter  Procedure(s) Performed: CYSTOSCOPY WITH LEFT RETROGRADE PYELOGRAM, URETEROSCOPY WITH HOLMIUM LASER  AND LEFT STENT PLACEMENT (Left)  Patient Location: PACU  Anesthesia Type:General  Level of Consciousness: awake and patient cooperative  Airway & Oxygen Therapy: Patient Spontanous Breathing and Patient connected to face mask  Post-op Assessment: Report given to RN and Post -op Vital signs reviewed and stable  Post vital signs: Reviewed and stable  Last Vitals:  Vitals Value Taken Time  BP 122/79 02/11/23 1625  Temp    Pulse 65 02/11/23 1628  Resp 10 02/11/23 1628  SpO2 99 % 02/11/23 1628  Vitals shown include unfiled device data.  Last Pain:  Vitals:   02/11/23 1216  TempSrc: Oral         Complications: No notable events documented.

## 2023-02-11 NOTE — Discharge Instructions (Signed)
[facility] [id number] Post Ureteroscopy With or Without Stent Instructions  Definitions:  Ureter: The duct that transports urine from the kidney to the bladder. Stent:   A plastic hollow tube that is placed into the ureter, from the kidney to the                 bladder to prevent the ureter from swelling shut.  GENERAL INSTRUCTIONS:  Despite the fact that no skin incisions were used, the area around the ureter and bladder is raw and irritated. The stent is a foreign body which will further irritate the bladder wall. This irritation is manifested by increased frequency of urination, both day and night, and by an increase in the urge to urinate. In some, the urge to urinate is present almost always. Sometimes the urge is strong enough that you may not be able to stop yourself from urinating. The only real cure is to remove the stent and then give time for the bladder wall to heal which can't be done until the danger of the ureter swelling shut has passed, which varies.  You may see some blood in your urine while the stent is in place and a few days afterwards. Do not be alarmed, even if the urine was clear for a while. Get off your feet and drink lots of fluids until clearing occurs. If you start to pass clots or don't improve, call us.  DIET: You may return to your normal diet immediately. Because of the raw surface of your bladder, alcohol, spicy foods, acid type foods and drinks with caffeine may cause irritation or frequency and should be used in moderation. To keep your urine flowing freely and to avoid constipation, drink plenty of fluids during the day ( 8-10 glasses ). Tip: Avoid cranberry juice because it is very acidic.  ACTIVITY: Your physical activity doesn't need to be restricted. However, if you are very active, you may see some blood in your urine. We suggest that you reduce your activity under these circumstances until the bleeding has stopped.  BOWELS: It is  important to keep your bowels regular during the postoperative period. Straining with bowel movements can cause bleeding. A bowel movement every other day is reasonable. Use a mild laxative if needed, such as Milk of Magnesia 2-3 tablespoons, or 2 Dulcolax tablets. Call if you continue to have problems. If you have been taking narcotics for pain, before, during or after your surgery, you may be constipated. Take a laxative if necessary.   MEDICATION: You should resume your pre-surgery medications unless told not to. In addition you will often be given an antibiotic to prevent infection. These should be taken as prescribed until the bottles are finished unless you are having an unusual reaction to one of the drugs.  PROBLEMS YOU SHOULD REPORT TO [place]:  Fevers over 100.5 Fahrenheit.  Heavy bleeding, or clots ( See above notes about blood in urine ).  Inability to urinate.  Drug reactions ( hives, rash, nausea, vomiting, diarrhea ).  Severe burning or pain with urination that is not improving.  FOLLOW-UP: You will need a follow-up appointment to monitor your progress. Call for this appointment at the number listed above. Usually the first appointment will be about three to fourteen days after your surgery.

## 2023-02-12 ENCOUNTER — Encounter (HOSPITAL_COMMUNITY): Payer: Self-pay | Admitting: Urology

## 2023-02-14 NOTE — Progress Notes (Signed)
Remote pacemaker transmission.   

## 2023-03-10 DIAGNOSIS — N200 Calculus of kidney: Secondary | ICD-10-CM | POA: Diagnosis not present

## 2023-04-02 ENCOUNTER — Other Ambulatory Visit: Payer: Self-pay | Admitting: Cardiovascular Disease

## 2023-04-02 DIAGNOSIS — I10 Essential (primary) hypertension: Secondary | ICD-10-CM

## 2023-04-02 DIAGNOSIS — I442 Atrioventricular block, complete: Secondary | ICD-10-CM

## 2023-04-02 DIAGNOSIS — Z952 Presence of prosthetic heart valve: Secondary | ICD-10-CM

## 2023-04-27 ENCOUNTER — Other Ambulatory Visit: Payer: Self-pay | Admitting: Sports Medicine

## 2023-04-27 DIAGNOSIS — M7989 Other specified soft tissue disorders: Secondary | ICD-10-CM

## 2023-04-28 ENCOUNTER — Ambulatory Visit (INDEPENDENT_AMBULATORY_CARE_PROVIDER_SITE_OTHER): Payer: 59

## 2023-04-28 DIAGNOSIS — I442 Atrioventricular block, complete: Secondary | ICD-10-CM | POA: Diagnosis not present

## 2023-04-28 LAB — CUP PACEART REMOTE DEVICE CHECK
Battery Remaining Longevity: 64 mo
Battery Remaining Percentage: 51 %
Battery Voltage: 2.99 V
Brady Statistic AP VP Percent: 27 %
Brady Statistic AP VS Percent: 1 %
Brady Statistic AS VP Percent: 72 %
Brady Statistic AS VS Percent: 1 %
Brady Statistic RA Percent Paced: 27 %
Brady Statistic RV Percent Paced: 99 %
Date Time Interrogation Session: 20250206020017
Implantable Lead Connection Status: 753985
Implantable Lead Connection Status: 753985
Implantable Lead Implant Date: 20191112
Implantable Lead Implant Date: 20191112
Implantable Lead Location: 753859
Implantable Lead Location: 753860
Implantable Pulse Generator Implant Date: 20191112
Lead Channel Impedance Value: 390 Ohm
Lead Channel Impedance Value: 560 Ohm
Lead Channel Pacing Threshold Amplitude: 0.5 V
Lead Channel Pacing Threshold Amplitude: 0.5 V
Lead Channel Pacing Threshold Pulse Width: 0.5 ms
Lead Channel Pacing Threshold Pulse Width: 0.5 ms
Lead Channel Sensing Intrinsic Amplitude: 10.4 mV
Lead Channel Sensing Intrinsic Amplitude: 3.8 mV
Lead Channel Setting Pacing Amplitude: 0.75 V
Lead Channel Setting Pacing Amplitude: 1.5 V
Lead Channel Setting Pacing Pulse Width: 0.5 ms
Lead Channel Setting Sensing Sensitivity: 3.5 mV
Pulse Gen Model: 2272
Pulse Gen Serial Number: 9086757

## 2023-05-08 ENCOUNTER — Encounter: Payer: Self-pay | Admitting: Cardiovascular Disease

## 2023-05-29 NOTE — Progress Notes (Unsigned)
 Cardiology Office Note:    Date:  05/29/2023   ID:  Patrick Walter, DOB January 05, 1956, MRN [id number]  PCP:  Monica Becton, MD   [facility] Providers Cardiologist:  Tonny Bollman, MD     Referring MD: Monica Becton,*   No chief complaint on file.   History of Present Illness:    Patrick Walter is a 68 y.o. male with a hx of bioprosthetic aortic valve replacement in 2015 without immediate complication. He required redo aortic valve replacement for prosthetic valve endocarditis in 2019 with postoperative course complicated by complete heart block requiring permanent pacemaker placement. He presents today for follow-up evaluation.  Last year he was noted to be hypertensive and he was started on amlodipine 5 mg daily.  Echo from 2024 demonstrated low normal LVEF of 50 to 55%, mildly dilated RV with normal RV function, mild mitral regurgitation, and normal function of the patient's aortic prosthesis with a mean transvalvular gradient of 6 mmHg and no paravalvular regurgitation.   Current Medications: No outpatient medications have been marked as taking for the 05/30/23 encounter (Appointment) with Tonny Bollman, MD.     Allergies:   Patient has no known allergies.   ROS:   Please see the history of present illness.    *** All other systems reviewed and are negative.  EKGs/Labs/Other Studies Reviewed:    The following studies were reviewed today: Cardiac Studies & Procedures   ______________________________________________________________________________________________     ECHOCARDIOGRAM  ECHOCARDIOGRAM COMPLETE 07/14/2022  Narrative ECHOCARDIOGRAM REPORT    Patient Name:   Patrick Walter Date of Exam: 07/14/2022 Medical Rec #:  [id number]       Height:       71.0 in Accession #:    [id number]      Weight:       200.0 lb Date of Birth:  24-Sep-1955       BSA:          2.109 m Patient Age:    66 years        BP:           144/90 mmHg Patient  Gender: M               HR:           66 bpm. Exam Location:  [address]  Procedure: 2D Echo, Cardiac Doppler, Color Doppler and 3D Echo  Indications:    S/P AVR Z95.2  History:        Patient has prior history of Echocardiogram examinations, most recent 05/23/2018. Pacemaker, Signs/Symptoms:Murmur; Risk Factors:Hypertension. Aortic Valve replacement Redo 02/05/2018.  Sonographer:    Thurman Coyer RDCS Referring Phys: [phone] Quentyn Kolbeck  IMPRESSIONS   1. S/P redo Aortic Valve Replacement with human allograft aortic root graft including Repair of Ascending Thoracic Aortic Aneurysm. Procedure Date: 01/26/2018. 2. Right ventricular systolic function is normal. The right ventricular size is mildly enlarged. There is normal pulmonary artery systolic pressure. 3. Left ventricular ejection fraction, by estimation, is 50 to 55%. The left ventricle has low normal function. The left ventricle has no regional wall motion abnormalities. The left ventricular internal cavity size was moderately dilated. There is mild left ventricular hypertrophy. Left ventricular diastolic parameters are consistent with Grade I diastolic dysfunction (impaired relaxation). 4. The mitral valve is normal in structure. Mild mitral valve regurgitation. No evidence of mitral stenosis. 5. The inferior vena cava is normal in size with greater than 50% respiratory variability, suggesting right atrial pressure of 3  mmHg. 6. Left atrial size was mildly dilated.  FINDINGS Left Ventricle: Left ventricular ejection fraction, by estimation, is 50 to 55%. The left ventricle has low normal function. The left ventricle has no regional wall motion abnormalities. The left ventricular internal cavity size was moderately dilated. There is mild left ventricular hypertrophy. Abnormal (paradoxical) septal motion, consistent with RV pacemaker. Left ventricular diastolic parameters are consistent with Grade I diastolic dysfunction (impaired  relaxation).  Right Ventricle: The right ventricular size is mildly enlarged. Right ventricular systolic function is normal. There is normal pulmonary artery systolic pressure. The tricuspid regurgitant velocity is 2.10 m/s, and with an assumed right atrial pressure of 3 mmHg, the estimated right ventricular systolic pressure is 20.6 mmHg.  Left Atrium: Left atrial size was mildly dilated.  Right Atrium: Right atrial size was normal in size.  Pericardium: There is no evidence of pericardial effusion.  Mitral Valve: The mitral valve is normal in structure. Mild mitral annular calcification. Mild mitral valve regurgitation. No evidence of mitral valve stenosis.  Tricuspid Valve: The tricuspid valve is normal in structure. Tricuspid valve regurgitation is mild . No evidence of tricuspid stenosis.  Aortic Valve: The aortic valve has been repaired/replaced. Aortic valve regurgitation is trivial. Aortic regurgitation PHT measures 459 msec. No aortic stenosis is present. Aortic valve mean gradient measures 6.0 mmHg. Aortic valve peak gradient measures 11.5 mmHg. Aortic valve area, by VTI measures 2.33 cm. There is a unknown Human allograft including root replacement, repair ascending aorta valve present in the aortic position. Procedure Date: 01/26/2018.  Pulmonic Valve: The pulmonic valve was normal in structure. Pulmonic valve regurgitation is trivial. No evidence of pulmonic stenosis.  Aorta: The aortic root is normal in size and structure.  Venous: The inferior vena cava is normal in size with greater than 50% respiratory variability, suggesting right atrial pressure of 3 mmHg.  IAS/Shunts: The interatrial septum is aneurysmal. No atrial level shunt detected by color flow Doppler.  Additional Comments: A device lead is visualized.   LEFT VENTRICLE PLAX 2D LVIDd:         6.70 cm      Diastology LVIDs:         5.20 cm      LV e' medial:    6.42 cm/s LV PW:         1.00 cm      LV E/e'  medial:  12.7 LV IVS:        1.20 cm      LV e' lateral:   9.79 cm/s LVOT diam:     2.50 cm      LV E/e' lateral: 8.3 LV SV:         87 LV SV Index:   41 LVOT Area:     4.91 cm  3D Volume EF: LV Volumes (MOD)            3D EF:        41 % LV vol d, MOD A2C: 173.0 ml LV EDV:       227 ml LV vol d, MOD A4C: 168.0 ml LV ESV:       135 ml LV vol s, MOD A2C: 88.5 ml  LV SV:        93 ml LV vol s, MOD A4C: 88.6 ml LV SV MOD A2C:     84.5 ml LV SV MOD A4C:     168.0 ml LV SV MOD BP:      80.9 ml  RIGHT  VENTRICLE RV Basal diam:  4.80 cm RV Mid diam:    3.40 cm RV S prime:     6.49 cm/s TAPSE (M-mode): 1.1 cm  LEFT ATRIUM              Index        RIGHT ATRIUM           Index LA diam:        5.40 cm  2.56 cm/m   RA Area:     19.90 cm LA Vol (A2C):   110.0 ml 52.17 ml/m  RA Volume:   54.00 ml  25.61 ml/m LA Vol (A4C):   58.3 ml  27.65 ml/m LA Biplane Vol: 80.7 ml  38.27 ml/m AORTIC VALVE AV Area (Vmax):    2.31 cm AV Area (Vmean):   2.25 cm AV Area (VTI):     2.33 cm AV Vmax:           169.50 cm/s AV Vmean:          110.500 cm/s AV VTI:            0.373 m AV Peak Grad:      11.5 mmHg AV Mean Grad:      6.0 mmHg LVOT Vmax:         79.70 cm/s LVOT Vmean:        50.600 cm/s LVOT VTI:          0.177 m LVOT/AV VTI ratio: 0.47 AI PHT:            459 msec  AORTA Ao Root diam: 3.60 cm Ao Asc diam:  4.00 cm  MITRAL VALVE               TRICUSPID VALVE MV Area (PHT): 2.91 cm    TR Peak grad:   17.6 mmHg MV Decel Time: 261 msec    TR Vmax:        210.00 cm/s MR PISA:        2.26 cm MR PISA Radius: 0.60 cm    SHUNTS MV E velocity: 81.40 cm/s  Systemic VTI:  0.18 m MV A velocity: 81.00 cm/s  Systemic Diam: 2.50 cm MV E/A ratio:  1.00  Olga Millers MD Electronically signed by Olga Millers MD Signature Date/Time: 07/14/2022/11:58:27 AM    Final   TEE  ECHO TEE 01/26/2018  Narrative *[place]* *[facility]* [address] [place],  [place] [address] [phone]  ------------------------------------------------------------------- Intraoperative Transesophageal Echocardiography  Patient:    Zymarion, Favorite MR #:       [id number] Study Date: 01/26/2018 Gender:     M Age:        62 Height:     185.4 cm Weight:     78.6 kg BSA:        2.01 m^2 Pt. Status: Room:       [facility]  ATTENDING    Tressie Stalker, M.D. ORDERING     Tressie Stalker, M.D. REFERRING    Tressie Stalker, M.D. PERFORMING   Jairo Ben, MD ADMITTING    Carron Curie [id number] SONOGRAPHER  Belva Chimes  cc:  ------------------------------------------------------------------- LV EF: 55% -   65%  ------------------------------------------------------------------- Indications:     Aortic stenosis 424.1, with h/o bicuspid Aortic valve. S/P Aortic valve bioprosthesis.  ------------------------------------------------------------------- History:   PMH:   Endocarditis.  ------------------------------------------------------------------- Study Conclusions  - Left ventricle: The cavity size was normal. Wall thickness was normal. Systolic function was normal. The estimated ejection fraction  was in the range of 55% to 65%. Wall motion was normal; there were no regional wall motion abnormalities. - Aortic valve: Normal-sized, noncalcified annulus. There is an area of possible fluid collection between the bioprosthetic valve and the annulus/root at the non-coronary cusp. A bioprosthesis was present. Cusp separation was normal. Mobility was not restricted. There was a large, 3.1 cm (L) x 2.0 cm (W), mobile vegetation on the left ventricular aspect of the prosthetic valve. The vegetation protrudes into the outflow tract with systole. Transvalvular velocity was increased. There was moderate stenosis, most likely due to the vegetation itself. Vmax 335 cm/s, with peak gradient 45 mmHg, mean gradient 24 mmHg. AVA (VTI) 0.77 cm2. There was no significant  regurgitation. - Aorta: The LVOT measures 2.33 cm, Sinus of Valsalva 4.45 cm, Sino-tubular junction 2.87 cm. As previously mentioned, there is an area of the Aortic root, near the non-coronary cusp which could represent infection. The descending aorta was normal, not dilated, and non-diseased. - Mitral valve: No evidence of vegetation. There was mild regurgitation directed centrally. - Tricuspid valve: No evidence of vegetation. There was mild regurgitation. - Staged echo: Limited post CBP exam: LV function improved to normal with time off of CPB. Wall motion appears normal. Approximate overall EF 60%. The homograft aortic valve and aortic root are well positioned. The valve is functioning well. No AI seen in LV outflow tract. Post aortic valve replacement images demonstrate no residual valvular insufficiency or perivalvular leak. No change post bypass in mitral valve function.  Impressions:  - Post aortic valve and root replacement surgery, the bioprosthetic valve appears to be functioning normally. Excellent aortic valve function without evidence for perivalvular leak. Other valves unchanged. No other significant changes from pre-bypass images.  ------------------------------------------------------------------- Study data:   Study status:  Routine.  Consent:  The risks, benefits, and alternatives to the procedure were explained to the patient and informed consent was obtained.  Procedure:  The patient reported no pain pre or post test. Sedation. General anesthesia was administered by anesthesiology staff. Transesophageal echocardiography. An adult multiplane transesophageal probe was inserted by the anesthesiologistwithout difficulty. Image quality was good.  Study completion:  There were no complications. Administered medications:   Propofol.          Intraoperative transesophageal echocardiography.  Birthdate:  Patient birthdate: 01-22-1956.  Age:  Patient is 68 yr old.  Sex:   Gender: male. BMI: 22.9 kg/m^2.  Blood pressure:     121/76  Patient status: Inpatient.  Study date:  Study date: 01/26/2018. Study time: 08:02 AM.  Location:  Operating room.  -------------------------------------------------------------------  ------------------------------------------------------------------- Left ventricle:  The cavity size was normal. Wall thickness was normal. Systolic function was normal. The estimated ejection fraction was in the range of 55% to 65%. Wall motion was normal; there were no regional wall motion abnormalities.  ------------------------------------------------------------------- Aortic valve:  Normal-sized, noncalcified annulus. There is an area of possible fluid collection between the bioprosthetic valve and the annulus/root at the non-coronary cusp. A bioprosthesis was present. Cusp separation was normal. Mobility was not restricted. There was a large, 3.1 cm (L) x 2.0 cm (W), mobile vegetation on the left ventricular aspect of the prosthetic valve. The vegetation protrudes into the outflow tract with systole.  Doppler: Transvalvular velocity was increased. There was moderate stenosis, most likely due to the vegetation itself. Vmax 335 cm/s, with peak gradient 45 mmHg, mean gradient 24 mmHg. AVA (VTI) 0.77 cm2. There was no significant regurgitation.  ------------------------------------------------------------------- Aorta:  The LVOT measures  2.33 cm, Sinus of Valsalva 4.45 cm, Sino-tubular junction 2.87 cm. As previously mentioned, there is an area of the Aortic root, near the non-coronary cusp which could represent infection. The descending aorta was normal, not dilated, and non-diseased.  ------------------------------------------------------------------- Mitral valve:   Normal-sized, noncalcified annulus. Structurally normal valve. Normal thickness, noncalcified leaflets . Leaflet separation was normal. Mobility was not restricted.  No echocardiographic evidence for prolapse.  No evidence of vegetation.  Doppler:   There was no evidence for stenosis.   There was mild regurgitation directed centrally.  ------------------------------------------------------------------- Left atrium:   No evidence of thrombus in the atrial cavity or appendage. The appendage was of normal size. Emptying velocity was normal.  ------------------------------------------------------------------- Atrial septum:  No defect or patent foramen ovale was identified on color Doppler interrogation.  ------------------------------------------------------------------- Pulmonary veins:  Visualization of the pulmonary venous anatomy is incomplete, but a significant abnormality is unlikely.  ------------------------------------------------------------------- Right ventricle:  The cavity size was normal. Wall thickness was normal. Systolic function was normal.  ------------------------------------------------------------------- Pulmonic valve:    Structurally normal valve.   Cusp separation was normal.  No evidence of vegetation.  Doppler:  There was trivial regurgitation around the PA catheter.  ------------------------------------------------------------------- Tricuspid valve:   Structurally normal valve.   Leaflet separation was normal.  No evidence of vegetation.  Doppler:  There was mild regurgitation.  ------------------------------------------------------------------- Pulmonary artery:   The main pulmonary artery was normal-sized.  ------------------------------------------------------------------- Right atrium:  The atrium was normal in size.  No evidence of thrombus.  ------------------------------------------------------------------- Pre bypass:  Post bypass:  ------------------------------------------------------------------- Post procedure conclusions Left ventricle:  Limited post CBP exam: LV function improved to normal  with time off of CPB. Wall motion appears normal. Approximate overall EF 60%. Aortic valve:  - The homograft aortic valve and aortic root are well positioned. The valve is functioning well. No AI seen in LV outflow tract. Post aortic valve replacement images demonstrate no residual valvular insufficiency or perivalvular leak.  Mitral valve:  - No change post bypass in mitral valve function.  Ascending Aorta:  - The LVOT measures 2.33 cm, Sinus of Valsalva 4.45 cm, Sino-tubular junction 2.87 cm. As previously mentioned, there is an area of the Aortic root, near the non-coronary cusp which could represent infection. The descending aorta was normal, not dilated, and non-diseased.  ------------------------------------------------------------------- Prepared and Electronically Authenticated by  Jairo Ben, MD 2019-11-07T19:53:58    CT SCANS  CT CORONARY MORPH W/CTA COR W/SCORE 11/30/2016  Addendum 12/02/2016  9:15 AM ADDENDUM REPORT: 12/02/2016 09:13  CLINICAL DATA:  68 year old male post AVR with known ascending aortic aneurysm, follow up scan.  EXAM: Cardiac TAVR CT  COMPARISON:  CTA on 12/15/2015  TECHNIQUE: The patient was scanned on a [employer]. A 120 kV retrospective scan was triggered in the descending thoracic aorta at 111 HU's. Gantry rotation speed was 250 msecs and collimation was .6 mm. No beta blockade or nitro were given. The 3D data set was reconstructed in 5% intervals of the R-R cycle. Systolic and diastolic phases were analyzed on a dedicated work station using MPR, MIP and VRT modes. The patient received 80 cc of contrast.  FINDINGS: Aortic Valve: A bioprosthetic aortic valve with good leaflet opening. Leaflets appear normal - no thickening or calcifications are seen.  Aorta: There is mild ascending aortic aneurysm, minimal calcifications.  Sinotubular Junction:  34 x 34 mm (previously 34 x 34 mm)  Ascending Thoracic Aorta:   42 x 42 mm (previously 43 mm)  Aortic  Arch:  Not visualized  Descending Thoracic Aorta:  33 x 31 mm (previously 33 x 31 mm)  Sinus of Valsalva Measurements:  Non-coronary:  34 mm  Right -coronary:  31 mm  Left -coronary:  39 mm  Coronary Arteries:  Normal origin, right dominance.  RCA - large, no plaque.  LM - minimal calcified plaque in the distal LM with 0-25% stenosis.  LAD - mild mixed plaque with 25-50% stenosis in the proximal to mid LAD. D1 no plaque.  LCX - minimal plaque in the ostial LCX, OM1 - no plaque.  Normal pulmonary vein drainage into the left atrium.  No thrombus in the left atrial appendage.  IMPRESSION: 1. Stable size of the ascending aortic aneurysm with maximum diameter 42 mm.  2.  Stable appearance of the aortic bioprosthesis.  3.  Mild nonobstructive CAD.  Tobias Alexander   Electronically Signed By: Tobias Alexander On: 12/02/2016 09:13  Narrative EXAM: OVER-READ INTERPRETATION  CT CHEST  The following report is an over-read performed by radiologist Dr. Marinda Elk [facility] on 12/01/2016. This over-read does not include interpretation of cardiac or coronary anatomy or pathology. The coronary calcium score/coronary CTA interpretation by the cardiologist is attached.  COMPARISON:  CTA of the chest on 12/15/2015  FINDINGS: Mediastinum/Nodes: No enlarged mediastinal or hilar lymph nodes. Visualized mediastinum and esophagus demonstrate no significant findings.  Lungs/Pleura: There is no evidence of pulmonary edema, consolidation, pneumothorax, nodule or pleural fluid.  Upper Abdomen: No acute abnormality.  Musculoskeletal: No chest wall mass or suspicious bone lesions identified.  IMPRESSION: No significant noncardiac findings.  Electronically Signed: By: Irish Lack M.D. On: 12/01/2016 08:29   CT SCANS  CT CORONARY MORPH W/CTA COR W/SCORE 11/14/2013  Addendum 11/14/2013  7:54 PM ADDENDUM  REPORT: 11/14/2013 19:52  CLINICAL DATA:  Aortic stenosis, dilated aortic root. Evaluate for TAVR.  EXAM: Cardiac TAVR CT  TECHNIQUE: The patient was scanned on a [employer] 256 scanner. A 120 kV retrospective scan was triggered in the descending thoracic aorta at 111 HU's. Gantry rotation speed was 270 msecs and collimation was .9 mm. No beta blockade or nitro were given. The 3D data set was reconstructed in 5% intervals of the R-R cycle. Systolic and diastolic phases were analyzed on a dedicated work station using MPR, MIP and VRT modes. The patient received 80 cc of contrast.  FINDINGS: Aortic Valve: Trileaflet aortic valve with partially fused right and non-coronary leaflets - functionally bileaflet valve. Severely calcified leaflets with severely restricted leaflet opening.  Aorta: Aortic root, sinotubular junction and ascending thoracic arch are mildly dilated. There is no dissection, no calcifications.  Sinotubular Junction:  34 x 34 mm  Ascending Thoracic Aorta:  40 x 40 mm  Aortic Arch:  33 x 32 mm  Descending Thoracic Aorta:  33 x 32 mm  Sinus of Valsalva Measurements:  Non-coronary:  39 mm  Right -coronary:  37 mm  Left -coronary:  39 mm  Coronary Artery Height above Annulus:  Left Main:  14 mm  Right Coronary:  20 mm  Virtual Basal Annulus Measurements:  Maximum/Minimum Diameter:  34 x 28 mm  Perimeter:  106 mm  Area:  752 mm2  Coronary Arteries: Originating in normal position. Right dominance. No significant plaque.  IMPRESSION: 1. Functionally bicuspid aortic valve with severely calcified leaflets and severely limited leaflet opening. Aortic annular measurements exceed currently available Edward-Sapien XT TAVR.  2. Aortic root, sinotubular junction and ascending thoracic arch are mildly dilated.  Aris Lot  Nelson   Electronically Signed By: Tobias Alexander On: 11/14/2013 19:52  Narrative EXAM: OVER-READ INTERPRETATION  CT  CHEST  The following report is an over-read performed by radiologist Dr. Maryelizabeth Rowan [facility] on 11/14/2013. This over-read does not include interpretation of cardiac or coronary anatomy or pathology. The coronary calcium score/coronary CTA interpretation by the cardiologist is attached.  COMPARISON:  None.  FINDINGS: Review of the lung parenchyma demonstrates no nodularity. The entirety of the lung parenchyma is not imaged. No mediastinal lymphadenopathy. Esophagus is normal. No aggressive osseous lesion.  IMPRESSION: No significant extra thoracic findings.  Electronically Signed: By: Genevive Bi M.D. On: 11/14/2013 15:55     ______________________________________________________________________________________________      EKG:        Recent Labs: 07/22/2022: ALT 15; TSH 2.67 02/07/2023: BUN 22; Creatinine, Ser 0.88; Hemoglobin 13.8; Platelets 214; Potassium 4.1; Sodium 141  Recent Lipid Panel    Component Value Date/Time   CHOL 206 (H) 07/22/2022 0944   TRIG 49 07/22/2022 0944   HDL 68 07/22/2022 0944   CHOLHDL 3.0 07/22/2022 0944   VLDL 13 04/21/2015 0912   LDLCALC 123 (H) 07/22/2022 0944     Risk Assessment/Calculations:   {Does this patient have ATRIAL FIBRILLATION?:[phone]}  No BP recorded.  {Refresh Note OR Click here to enter BP  :1}***         Physical Exam:    VS:  There were no vitals taken for this visit.    Wt Readings from Last 3 Encounters:  02/11/23 215 lb 6.2 oz (97.7 kg)  02/07/23 214 lb (97.1 kg)  07/22/22 206 lb (93.4 kg)     GEN: *** Well nourished, well developed in no acute distress HEENT: Normal NECK: No JVD; No carotid bruits LYMPHATICS: No lymphadenopathy CARDIAC: ***RRR, no murmurs, rubs, gallops RESPIRATORY:  Clear to auscultation without rales, wheezing or rhonchi  ABDOMEN: Soft, non-tender, non-distended MUSCULOSKELETAL:  No edema; No deformity  SKIN: Warm and dry NEUROLOGIC:  Alert and  oriented x 3 PSYCHIATRIC:  Normal affect   Assessment & Plan S/P AVR  Primary hypertension  Cardiac pacemaker in situ        {Are you ordering a CV Procedure (e.g. stress test, cath, DCCV, TEE, etc)?   Press F2        :[id number]}    Medication Adjustments/Labs and Tests Ordered: Current medicines are reviewed at length with the patient today.  Concerns regarding medicines are outlined above.  No orders of the defined types were placed in this encounter.  No orders of the defined types were placed in this encounter.   There are no Patient Instructions on file for this visit.   Signed, Tonny Bollman, MD  05/29/2023 6:02 PM    [facility]

## 2023-05-30 ENCOUNTER — Encounter: Payer: Self-pay | Admitting: Cardiovascular Disease

## 2023-05-30 ENCOUNTER — Ambulatory Visit: Payer: Medicare Other | Attending: Cardiovascular Disease | Admitting: Cardiovascular Disease

## 2023-05-30 VITALS — BP 142/86 | HR 77 | Ht 71.0 in | Wt 214.0 lb

## 2023-05-30 DIAGNOSIS — I10 Essential (primary) hypertension: Secondary | ICD-10-CM | POA: Diagnosis not present

## 2023-05-30 DIAGNOSIS — Z79899 Other long term (current) drug therapy: Secondary | ICD-10-CM

## 2023-05-30 DIAGNOSIS — Z952 Presence of prosthetic heart valve: Secondary | ICD-10-CM | POA: Diagnosis not present

## 2023-05-30 DIAGNOSIS — Z950 Presence of cardiac pacemaker: Secondary | ICD-10-CM | POA: Diagnosis not present

## 2023-05-30 MED ORDER — TELMISARTAN 20 MG PO TABS: 20.0000 mg | ORAL_TABLET | Freq: Every day | ORAL | 3 refills | Status: AC

## 2023-05-30 NOTE — Patient Instructions (Signed)
 Medication Instructions:  STOP Amlodipine START Telmisartan/Micardis 20mg  daily *If you need a refill on your cardiac medications before your next appointment, please call your pharmacy*   Lab Work: BMET in 3 weeks If you have labs (blood work) drawn today and your tests are completely normal, you will receive your results only by: MyChart Message (if you have MyChart) OR A paper copy in the mail If you have any lab test that is abnormal or we need to change your treatment, we will call you to review the results.  Testing/Procedures: ECHO (prior to next year's visit) Your physician has requested that you have an echocardiogram. Echocardiography is a painless test that uses sound waves to create images of your heart. It provides your doctor with information about the size and shape of your heart and how well your heart's chambers and valves are working. This procedure takes approximately one hour. There are no restrictions for this procedure. Please do NOT wear cologne, perfume, aftershave, or lotions (deodorant is allowed). Please arrive 15 minutes prior to your appointment time.  Please note: We ask at that you not bring children with you during ultrasound (echo/ vascular) testing. Due to room size and safety concerns, children are not allowed in the ultrasound rooms during exams. Our front office staff cannot provide observation of children in our lobby area while testing is being conducted. An adult accompanying a patient to their appointment will only be allowed in the ultrasound room at the discretion of the ultrasound technician under special circumstances. We apologize for any inconvenience.  Follow-Up: At [facility], you and your health needs are our priority.  As part of our continuing mission to provide you with exceptional heart care, we have created designated Provider Care Teams.  These Care Teams include your primary Cardiologist (physician) and Advanced Practice  Providers (APPs -  Physician Assistants and Nurse Practitioners) who all work together to provide you with the care you need, when you need it.  Your next appointment:   1 year(s)  Provider:   Tonny Bollman, MD      1st Floor: - Lobby - Registration  - Pharmacy  - Lab - Cafe  2nd Floor: - PV Lab - Diagnostic Testing (echo, CT, nuclear med)  3rd Floor: - Vacant  4th Floor: - TCTS (cardiothoracic surgery) - AFib Clinic - Structural Heart Clinic - Vascular Surgery  - Vascular Ultrasound  5th Floor: - [facility] (general and EP) - Clinical Pharmacy for coumadin, hypertension, lipid, weight-loss medications, and med management appointments    Valet parking services will be available as well.

## 2023-06-02 NOTE — Progress Notes (Signed)
 Remote pacemaker transmission.

## 2023-07-18 DIAGNOSIS — Z79899 Other long term (current) drug therapy: Secondary | ICD-10-CM | POA: Diagnosis not present

## 2023-07-18 DIAGNOSIS — Z950 Presence of cardiac pacemaker: Secondary | ICD-10-CM | POA: Diagnosis not present

## 2023-07-18 DIAGNOSIS — I10 Essential (primary) hypertension: Secondary | ICD-10-CM | POA: Diagnosis not present

## 2023-07-18 DIAGNOSIS — Z952 Presence of prosthetic heart valve: Secondary | ICD-10-CM | POA: Diagnosis not present

## 2023-07-19 LAB — BASIC METABOLIC PANEL WITH GFR
BUN/Creatinine Ratio: 19 (ref 10–24)
BUN: 23 mg/dL (ref 8–27)
CO2: 23 mmol/L (ref 20–29)
Calcium: 9.2 mg/dL (ref 8.6–10.2)
Chloride: 107 mmol/L — ABNORMAL HIGH (ref 96–106)
Creatinine, Ser: 1.23 mg/dL (ref 0.76–1.27)
Glucose: 99 mg/dL (ref 70–99)
Potassium: 4.6 mmol/L (ref 3.5–5.2)
Sodium: 143 mmol/L (ref 134–144)
eGFR: 65 mL/min/{1.73_m2} (ref >59)

## 2023-07-28 ENCOUNTER — Ambulatory Visit (INDEPENDENT_AMBULATORY_CARE_PROVIDER_SITE_OTHER): Payer: 59

## 2023-07-28 DIAGNOSIS — I442 Atrioventricular block, complete: Secondary | ICD-10-CM

## 2023-08-01 LAB — CUP PACEART REMOTE DEVICE CHECK
Battery Remaining Longevity: 61 mo
Battery Remaining Percentage: 49 %
Battery Voltage: 2.98 V
Brady Statistic AP VP Percent: 28 %
Brady Statistic AP VS Percent: 1 %
Brady Statistic AS VP Percent: 71 %
Brady Statistic AS VS Percent: 1 %
Brady Statistic RA Percent Paced: 28 %
Brady Statistic RV Percent Paced: 99 %
Date Time Interrogation Session: 20250510015021
Implantable Lead Connection Status: 753985
Implantable Lead Connection Status: 753985
Implantable Lead Implant Date: 20191112
Implantable Lead Implant Date: 20191112
Implantable Lead Location: 753859
Implantable Lead Location: 753860
Implantable Pulse Generator Implant Date: 20191112
Lead Channel Impedance Value: 400 Ohm
Lead Channel Impedance Value: 590 Ohm
Lead Channel Pacing Threshold Amplitude: 0.5 V
Lead Channel Pacing Threshold Amplitude: 0.625 V
Lead Channel Pacing Threshold Pulse Width: 0.5 ms
Lead Channel Pacing Threshold Pulse Width: 0.5 ms
Lead Channel Sensing Intrinsic Amplitude: 4 mV
Lead Channel Sensing Intrinsic Amplitude: 5.6 mV
Lead Channel Setting Pacing Amplitude: 0.875
Lead Channel Setting Pacing Amplitude: 1.5 V
Lead Channel Setting Pacing Pulse Width: 0.5 ms
Lead Channel Setting Sensing Sensitivity: 3.5 mV
Pulse Gen Model: 2272
Pulse Gen Serial Number: 9086757

## 2023-08-05 ENCOUNTER — Ambulatory Visit: Payer: Self-pay | Admitting: Cardiovascular Disease

## 2023-09-05 NOTE — Addendum Note (Signed)
 Addended by: Lott Rouleau A on: 09/05/2023 09:29 AM   Modules accepted: Orders

## 2023-09-05 NOTE — Progress Notes (Signed)
 Remote pacemaker transmission.

## 2023-09-30 DIAGNOSIS — H53022 Refractive amblyopia, left eye: Secondary | ICD-10-CM | POA: Diagnosis not present

## 2023-10-27 ENCOUNTER — Other Ambulatory Visit: Payer: Self-pay | Admitting: Sports Medicine

## 2023-10-27 DIAGNOSIS — M7989 Other specified soft tissue disorders: Secondary | ICD-10-CM

## 2023-10-28 ENCOUNTER — Encounter: Payer: Self-pay | Admitting: Cardiovascular Disease

## 2023-11-18 ENCOUNTER — Ambulatory Visit (INDEPENDENT_AMBULATORY_CARE_PROVIDER_SITE_OTHER)

## 2023-11-18 DIAGNOSIS — I442 Atrioventricular block, complete: Secondary | ICD-10-CM | POA: Diagnosis not present

## 2023-11-19 LAB — CUP PACEART REMOTE DEVICE CHECK
Battery Remaining Longevity: 56 mo
Battery Remaining Percentage: 46 %
Battery Voltage: 2.98 V
Brady Statistic AP VP Percent: 29 %
Brady Statistic AP VS Percent: 1 %
Brady Statistic AS VP Percent: 70 %
Brady Statistic AS VS Percent: 1 %
Brady Statistic RA Percent Paced: 29 %
Brady Statistic RV Percent Paced: 99 %
Date Time Interrogation Session: 20250829015019
Implantable Lead Connection Status: 753985
Implantable Lead Connection Status: 753985
Implantable Lead Implant Date: 20191112
Implantable Lead Implant Date: 20191112
Implantable Lead Location: 753859
Implantable Lead Location: 753860
Implantable Pulse Generator Implant Date: 20191112
Lead Channel Impedance Value: 390 Ohm
Lead Channel Impedance Value: 560 Ohm
Lead Channel Pacing Threshold Amplitude: 0.5 V
Lead Channel Pacing Threshold Amplitude: 0.5 V
Lead Channel Pacing Threshold Pulse Width: 0.5 ms
Lead Channel Pacing Threshold Pulse Width: 0.5 ms
Lead Channel Sensing Intrinsic Amplitude: 2.8 mV
Lead Channel Sensing Intrinsic Amplitude: 4 mV
Lead Channel Setting Pacing Amplitude: 0.75 V
Lead Channel Setting Pacing Amplitude: 1.5 V
Lead Channel Setting Pacing Pulse Width: 0.5 ms
Lead Channel Setting Sensing Sensitivity: 3.5 mV
Pulse Gen Model: 2272
Pulse Gen Serial Number: 9086757

## 2023-11-20 ENCOUNTER — Encounter: Payer: Self-pay | Admitting: Cardiovascular Disease

## 2023-11-22 ENCOUNTER — Encounter: Payer: Self-pay | Admitting: Sports Medicine

## 2023-11-22 ENCOUNTER — Encounter: Admitting: Sports Medicine

## 2023-11-24 ENCOUNTER — Ambulatory Visit: Payer: Self-pay | Admitting: Cardiovascular Disease

## 2023-11-28 NOTE — Progress Notes (Signed)
 Remote PPM Transmission

## 2023-12-16 ENCOUNTER — Ambulatory Visit: Admitting: Cardiovascular Disease

## 2024-01-13 ENCOUNTER — Ambulatory Visit: Admitting: Cardiovascular Disease

## 2024-02-20 ENCOUNTER — Ambulatory Visit

## 2024-02-20 DIAGNOSIS — I442 Atrioventricular block, complete: Secondary | ICD-10-CM

## 2024-02-22 LAB — CUP PACEART REMOTE DEVICE CHECK
Battery Remaining Longevity: 54 mo
Battery Remaining Percentage: 43 %
Battery Voltage: 2.98 V
Brady Statistic AP VP Percent: 29 %
Brady Statistic AP VS Percent: 1 %
Brady Statistic AS VP Percent: 70 %
Brady Statistic AS VS Percent: 1 %
Brady Statistic RA Percent Paced: 29 %
Brady Statistic RV Percent Paced: 99 %
Date Time Interrogation Session: 20251203011602
Implantable Lead Connection Status: 753985
Implantable Lead Connection Status: 753985
Implantable Lead Implant Date: 20191112
Implantable Lead Implant Date: 20191112
Implantable Lead Location: 753859
Implantable Lead Location: 753860
Implantable Pulse Generator Implant Date: 20191112
Lead Channel Impedance Value: 410 Ohm
Lead Channel Impedance Value: 580 Ohm
Lead Channel Pacing Threshold Amplitude: 0.5 V
Lead Channel Pacing Threshold Amplitude: 0.5 V
Lead Channel Pacing Threshold Pulse Width: 0.5 ms
Lead Channel Pacing Threshold Pulse Width: 0.5 ms
Lead Channel Sensing Intrinsic Amplitude: 3.1 mV
Lead Channel Sensing Intrinsic Amplitude: 4.2 mV
Lead Channel Setting Pacing Amplitude: 0.75 V
Lead Channel Setting Pacing Amplitude: 1.5 V
Lead Channel Setting Pacing Pulse Width: 0.5 ms
Lead Channel Setting Sensing Sensitivity: 3.5 mV
Pulse Gen Model: 2272
Pulse Gen Serial Number: 9086757

## 2024-02-23 ENCOUNTER — Ambulatory Visit: Payer: Self-pay | Admitting: Cardiovascular Disease

## 2024-02-24 NOTE — Progress Notes (Signed)
 Remote PPM Transmission

## 2024-03-12 ENCOUNTER — Encounter: Payer: Self-pay | Admitting: Cardiovascular Disease

## 2024-03-12 ENCOUNTER — Ambulatory Visit: Attending: Cardiovascular Disease | Admitting: Cardiovascular Disease

## 2024-03-12 VITALS — BP 140/90 | HR 67 | Ht 71.0 in | Wt 218.0 lb

## 2024-03-12 DIAGNOSIS — I442 Atrioventricular block, complete: Secondary | ICD-10-CM | POA: Diagnosis not present

## 2024-03-12 DIAGNOSIS — Z950 Presence of cardiac pacemaker: Secondary | ICD-10-CM | POA: Diagnosis not present

## 2024-03-12 LAB — CUP PACEART INCLINIC DEVICE CHECK
Battery Remaining Longevity: 52 mo
Battery Voltage: 2.98 V
Brady Statistic RA Percent Paced: 29 %
Brady Statistic RV Percent Paced: 99 %
Date Time Interrogation Session: 20251222161443
Implantable Lead Connection Status: 753985
Implantable Lead Connection Status: 753985
Implantable Lead Implant Date: 20191112
Implantable Lead Implant Date: 20191112
Implantable Lead Location: 753859
Implantable Lead Location: 753860
Implantable Pulse Generator Implant Date: 20191112
Lead Channel Impedance Value: 387.5 Ohm
Lead Channel Impedance Value: 550 Ohm
Lead Channel Pacing Threshold Amplitude: 0.5 V
Lead Channel Pacing Threshold Amplitude: 0.5 V
Lead Channel Pacing Threshold Pulse Width: 0.5 ms
Lead Channel Pacing Threshold Pulse Width: 0.5 ms
Lead Channel Sensing Intrinsic Amplitude: 10.2 mV
Lead Channel Sensing Intrinsic Amplitude: 3.1 mV
Lead Channel Setting Pacing Amplitude: 0.75 V
Lead Channel Setting Pacing Amplitude: 1.5 V
Lead Channel Setting Pacing Pulse Width: 0.5 ms
Lead Channel Setting Sensing Sensitivity: 3.5 mV
Pulse Gen Model: 2272
Pulse Gen Serial Number: 9086757

## 2024-03-12 NOTE — Patient Instructions (Signed)
 Medication Instructions:  Your physician recommends that you continue on your current medications as directed. Please refer to the Current Medication list given to you today.  *If you need a refill on your cardiac medications before your next appointment, please call your pharmacy*  Follow-Up: At [facility], you and your health needs are our priority.  As part of our continuing mission to provide you with exceptional heart care, our providers are all part of one team.  This team includes your primary Cardiologist (physician) and Advanced Practice Providers or APPs (Physician Assistants and Nurse Practitioners) who all work together to provide you with the care you need, when you need it.  Your next appointment:   1 year(s)  Provider:   You may see Eulas FORBES Furbish, MD or one of the following Advanced Practice Providers on your designated Care Team:   Charlies Arthur, [place] Ozell Jodie Passey, PA-C Suzann Riddle, NP Daphne Barrack, NP Artist Pouch, PA-C

## 2024-03-12 NOTE — Progress Notes (Signed)
" °  Electrophysiology Office Note:    Date:  03/12/2024   ID:  Patrick Walter, DOB 18-May-1955, MRN [id number]  PCP:  Patient, No Pcp Per   [facility] Providers Cardiologist:  Ozell Fell, MD Electrophysiologist:  Eulas FORBES Furbish, MD     Referring MD: No ref. provider found   History of Present Illness:    Patrick Walter is a 68 y.o. male with a medical history significant for bioprosthetic aortic valve (2015), complicated by complete heart block, [employer] Jude pacemaker referred for device follow-up.      History of Present Illness He had a bioprosthetic aortic valve placed in 2015.  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  This was complicated by complete heart block.  Dr. Kelsie placed an Abbott dual-chamber pacemaker.        Today, he reports that he is doing well and has no complaints. he has no device related complaints -- no new tenderness, drainage, redness.   EKGs/Labs/Other Studies Reviewed Today:     Echocardiogram:  TTE April 2024 LVEF 50 to 55%.  Grade 1 diastolic dysfunction.  Mildly enlarged RV.    EKG:         Physical Exam:    VS:  BP (!) 140/90 (BP Location: Left Arm, Patient Position: Sitting, Cuff Size: Large)   Pulse 67   Ht 5' 11 (1.803 m)   Wt 218 lb (98.9 kg)   SpO2 97%   BMI 30.40 kg/m     Wt Readings from Last 3 Encounters:  03/12/24 218 lb (98.9 kg)  05/30/23 214 lb (97.1 kg)  02/11/23 215 lb 6.2 oz (97.7 kg)     GEN: Well nourished, well developed in no acute distress CARDIAC: RRR, no murmurs, rubs, gallops The device site is normal -- no tenderness, edema, drainage, redness, threatened erosion.  RESPIRATORY:  Normal work of breathing MUSCULOSKELETAL: no edema    ASSESSMENT & PLAN:     Complete heart block Abbott dual-chamber pacemaker in place I reviewed today's device interrogation.  See Paceart for details Device is functioning normally He is pacemaker  dependent  Bioprosthetic aortic valve Valve replaced in 2019 due to endocarditis      Signed, Eulas FORBES Furbish, MD  03/12/2024 4:19 PM    [facility] "

## 2024-03-21 ENCOUNTER — Ambulatory Visit: Payer: Self-pay | Admitting: Cardiovascular Disease

## 2024-05-18 ENCOUNTER — Encounter

## 2024-05-21 ENCOUNTER — Ambulatory Visit

## 2024-08-17 ENCOUNTER — Encounter

## 2024-08-20 ENCOUNTER — Ambulatory Visit

## 2024-11-16 ENCOUNTER — Encounter

## 2024-11-19 ENCOUNTER — Ambulatory Visit
